# Patient Record
Sex: Female | Born: 1960
Health system: Southern US, Community
[De-identification: ages and names within clinical notes are randomized; demographics above are authoritative.]

## PROBLEM LIST (undated history)

## (undated) DIAGNOSIS — D2120 Benign neoplasm of connective and other soft tissue of unspecified lower limb, including hip: Secondary | ICD-10-CM

## (undated) DIAGNOSIS — M1990 Unspecified osteoarthritis, unspecified site: Secondary | ICD-10-CM

## (undated) DIAGNOSIS — D2371 Other benign neoplasm of skin of right lower limb, including hip: Secondary | ICD-10-CM

## (undated) DIAGNOSIS — M47817 Spondylosis without myelopathy or radiculopathy, lumbosacral region: Secondary | ICD-10-CM

## (undated) DIAGNOSIS — M797 Fibromyalgia: Secondary | ICD-10-CM

## (undated) DIAGNOSIS — G629 Polyneuropathy, unspecified: Secondary | ICD-10-CM

## (undated) DIAGNOSIS — G894 Chronic pain syndrome: Secondary | ICD-10-CM

## (undated) DIAGNOSIS — I10 Essential (primary) hypertension: Secondary | ICD-10-CM

## (undated) DIAGNOSIS — K219 Gastro-esophageal reflux disease without esophagitis: Secondary | ICD-10-CM

## (undated) DIAGNOSIS — R519 Headache, unspecified: Secondary | ICD-10-CM

## (undated) HISTORY — PX: REPLACEMENT TOTAL KNEE: SUR1224

## (undated) HISTORY — PX: COLONOSCOPY: SHX174

---

## 2010-12-01 DIAGNOSIS — R208 Other disturbances of skin sensation: Secondary | ICD-10-CM | POA: Insufficient documentation

## 2010-12-01 DIAGNOSIS — M797 Fibromyalgia: Secondary | ICD-10-CM | POA: Insufficient documentation

## 2011-02-17 DIAGNOSIS — E785 Hyperlipidemia, unspecified: Secondary | ICD-10-CM | POA: Insufficient documentation

## 2011-10-30 DIAGNOSIS — F32A Depression, unspecified: Secondary | ICD-10-CM | POA: Insufficient documentation

## 2011-10-30 DIAGNOSIS — F329 Major depressive disorder, single episode, unspecified: Secondary | ICD-10-CM | POA: Insufficient documentation

## 2012-02-18 DIAGNOSIS — M7918 Myalgia, other site: Secondary | ICD-10-CM | POA: Insufficient documentation

## 2012-02-18 DIAGNOSIS — M545 Low back pain, unspecified: Secondary | ICD-10-CM | POA: Insufficient documentation

## 2012-02-24 DIAGNOSIS — R2681 Unsteadiness on feet: Secondary | ICD-10-CM | POA: Insufficient documentation

## 2012-06-19 DIAGNOSIS — K219 Gastro-esophageal reflux disease without esophagitis: Secondary | ICD-10-CM | POA: Insufficient documentation

## 2012-07-30 DIAGNOSIS — M1710 Unilateral primary osteoarthritis, unspecified knee: Secondary | ICD-10-CM | POA: Insufficient documentation

## 2013-02-13 DIAGNOSIS — I10 Essential (primary) hypertension: Secondary | ICD-10-CM | POA: Insufficient documentation

## 2013-12-09 DIAGNOSIS — M2550 Pain in unspecified joint: Secondary | ICD-10-CM | POA: Insufficient documentation

## 2014-05-17 DIAGNOSIS — R200 Anesthesia of skin: Secondary | ICD-10-CM | POA: Insufficient documentation

## 2014-05-21 HISTORY — PX: BACK SURGERY: SHX140

## 2014-08-16 DIAGNOSIS — Z96652 Presence of left artificial knee joint: Secondary | ICD-10-CM | POA: Insufficient documentation

## 2016-05-21 HISTORY — PX: BREAST BIOPSY: SHX20

## 2016-09-05 DIAGNOSIS — M47817 Spondylosis without myelopathy or radiculopathy, lumbosacral region: Secondary | ICD-10-CM | POA: Insufficient documentation

## 2017-05-10 DIAGNOSIS — G950 Syringomyelia and syringobulbia: Secondary | ICD-10-CM | POA: Insufficient documentation

## 2017-08-05 DIAGNOSIS — G629 Polyneuropathy, unspecified: Secondary | ICD-10-CM | POA: Diagnosis not present

## 2017-08-05 DIAGNOSIS — G950 Syringomyelia and syringobulbia: Secondary | ICD-10-CM | POA: Diagnosis not present

## 2017-08-09 DIAGNOSIS — Z79899 Other long term (current) drug therapy: Secondary | ICD-10-CM | POA: Diagnosis not present

## 2017-08-09 DIAGNOSIS — G8929 Other chronic pain: Secondary | ICD-10-CM | POA: Diagnosis not present

## 2017-08-09 DIAGNOSIS — G950 Syringomyelia and syringobulbia: Secondary | ICD-10-CM | POA: Diagnosis not present

## 2017-08-09 DIAGNOSIS — Z5181 Encounter for therapeutic drug level monitoring: Secondary | ICD-10-CM | POA: Diagnosis not present

## 2017-08-09 DIAGNOSIS — M546 Pain in thoracic spine: Secondary | ICD-10-CM | POA: Diagnosis not present

## 2017-08-09 DIAGNOSIS — M545 Low back pain: Secondary | ICD-10-CM | POA: Diagnosis not present

## 2017-08-09 DIAGNOSIS — R339 Retention of urine, unspecified: Secondary | ICD-10-CM | POA: Diagnosis not present

## 2017-08-10 DIAGNOSIS — G950 Syringomyelia and syringobulbia: Secondary | ICD-10-CM | POA: Diagnosis not present

## 2017-08-13 DIAGNOSIS — G950 Syringomyelia and syringobulbia: Secondary | ICD-10-CM | POA: Diagnosis not present

## 2017-08-13 DIAGNOSIS — M4714 Other spondylosis with myelopathy, thoracic region: Secondary | ICD-10-CM | POA: Diagnosis not present

## 2017-08-22 DIAGNOSIS — M4714 Other spondylosis with myelopathy, thoracic region: Secondary | ICD-10-CM | POA: Diagnosis not present

## 2017-08-22 DIAGNOSIS — M542 Cervicalgia: Secondary | ICD-10-CM | POA: Diagnosis not present

## 2017-08-22 DIAGNOSIS — M4727 Other spondylosis with radiculopathy, lumbosacral region: Secondary | ICD-10-CM | POA: Diagnosis not present

## 2017-08-22 DIAGNOSIS — M4726 Other spondylosis with radiculopathy, lumbar region: Secondary | ICD-10-CM | POA: Diagnosis not present

## 2017-08-22 DIAGNOSIS — M545 Low back pain: Secondary | ICD-10-CM | POA: Diagnosis not present

## 2017-08-22 DIAGNOSIS — G950 Syringomyelia and syringobulbia: Secondary | ICD-10-CM | POA: Diagnosis not present

## 2017-08-23 DIAGNOSIS — M792 Neuralgia and neuritis, unspecified: Secondary | ICD-10-CM | POA: Diagnosis not present

## 2017-08-27 DIAGNOSIS — M4714 Other spondylosis with myelopathy, thoracic region: Secondary | ICD-10-CM | POA: Diagnosis not present

## 2017-08-27 DIAGNOSIS — G950 Syringomyelia and syringobulbia: Secondary | ICD-10-CM | POA: Diagnosis not present

## 2017-09-26 DIAGNOSIS — R7303 Prediabetes: Secondary | ICD-10-CM | POA: Diagnosis not present

## 2017-09-26 DIAGNOSIS — E785 Hyperlipidemia, unspecified: Secondary | ICD-10-CM | POA: Diagnosis not present

## 2017-09-26 DIAGNOSIS — N951 Menopausal and female climacteric states: Secondary | ICD-10-CM | POA: Diagnosis not present

## 2017-09-26 DIAGNOSIS — K635 Polyp of colon: Secondary | ICD-10-CM | POA: Diagnosis not present

## 2017-09-26 DIAGNOSIS — Z1211 Encounter for screening for malignant neoplasm of colon: Secondary | ICD-10-CM | POA: Diagnosis not present

## 2017-10-07 DIAGNOSIS — M47817 Spondylosis without myelopathy or radiculopathy, lumbosacral region: Secondary | ICD-10-CM | POA: Diagnosis not present

## 2017-10-07 DIAGNOSIS — G8929 Other chronic pain: Secondary | ICD-10-CM | POA: Diagnosis not present

## 2017-11-17 DIAGNOSIS — M1990 Unspecified osteoarthritis, unspecified site: Secondary | ICD-10-CM | POA: Diagnosis not present

## 2017-11-18 DIAGNOSIS — G629 Polyneuropathy, unspecified: Secondary | ICD-10-CM | POA: Diagnosis not present

## 2017-11-18 DIAGNOSIS — M797 Fibromyalgia: Secondary | ICD-10-CM | POA: Diagnosis not present

## 2017-11-18 DIAGNOSIS — M47817 Spondylosis without myelopathy or radiculopathy, lumbosacral region: Secondary | ICD-10-CM | POA: Diagnosis not present

## 2017-11-18 DIAGNOSIS — G950 Syringomyelia and syringobulbia: Secondary | ICD-10-CM | POA: Diagnosis not present

## 2017-11-18 DIAGNOSIS — Z79899 Other long term (current) drug therapy: Secondary | ICD-10-CM | POA: Diagnosis not present

## 2017-11-18 DIAGNOSIS — G894 Chronic pain syndrome: Secondary | ICD-10-CM | POA: Diagnosis not present

## 2017-12-11 DIAGNOSIS — Z951 Presence of aortocoronary bypass graft: Secondary | ICD-10-CM | POA: Diagnosis not present

## 2017-12-11 DIAGNOSIS — M4714 Other spondylosis with myelopathy, thoracic region: Secondary | ICD-10-CM | POA: Diagnosis not present

## 2017-12-11 DIAGNOSIS — Z01818 Encounter for other preprocedural examination: Secondary | ICD-10-CM | POA: Diagnosis not present

## 2017-12-11 DIAGNOSIS — Z87891 Personal history of nicotine dependence: Secondary | ICD-10-CM | POA: Diagnosis not present

## 2017-12-11 DIAGNOSIS — I493 Ventricular premature depolarization: Secondary | ICD-10-CM | POA: Diagnosis not present

## 2017-12-11 DIAGNOSIS — R001 Bradycardia, unspecified: Secondary | ICD-10-CM | POA: Diagnosis not present

## 2017-12-23 DIAGNOSIS — M4714 Other spondylosis with myelopathy, thoracic region: Secondary | ICD-10-CM | POA: Diagnosis not present

## 2017-12-23 DIAGNOSIS — Z87891 Personal history of nicotine dependence: Secondary | ICD-10-CM | POA: Diagnosis not present

## 2017-12-23 DIAGNOSIS — I10 Essential (primary) hypertension: Secondary | ICD-10-CM | POA: Diagnosis not present

## 2017-12-23 DIAGNOSIS — Z4789 Encounter for other orthopedic aftercare: Secondary | ICD-10-CM | POA: Diagnosis not present

## 2017-12-23 DIAGNOSIS — Z79899 Other long term (current) drug therapy: Secondary | ICD-10-CM | POA: Diagnosis not present

## 2017-12-23 DIAGNOSIS — J309 Allergic rhinitis, unspecified: Secondary | ICD-10-CM | POA: Diagnosis not present

## 2017-12-23 DIAGNOSIS — K219 Gastro-esophageal reflux disease without esophagitis: Secondary | ICD-10-CM | POA: Diagnosis not present

## 2017-12-23 DIAGNOSIS — M545 Low back pain: Secondary | ICD-10-CM | POA: Diagnosis not present

## 2017-12-23 DIAGNOSIS — G9619 Other disorders of meninges, not elsewhere classified: Secondary | ICD-10-CM | POA: Diagnosis not present

## 2017-12-23 DIAGNOSIS — K5900 Constipation, unspecified: Secondary | ICD-10-CM | POA: Diagnosis not present

## 2017-12-23 DIAGNOSIS — G9529 Other cord compression: Secondary | ICD-10-CM | POA: Diagnosis not present

## 2017-12-23 DIAGNOSIS — Z96652 Presence of left artificial knee joint: Secondary | ICD-10-CM | POA: Diagnosis not present

## 2017-12-23 DIAGNOSIS — G959 Disease of spinal cord, unspecified: Secondary | ICD-10-CM | POA: Diagnosis not present

## 2017-12-23 DIAGNOSIS — E785 Hyperlipidemia, unspecified: Secondary | ICD-10-CM | POA: Diagnosis not present

## 2017-12-23 DIAGNOSIS — E569 Vitamin deficiency, unspecified: Secondary | ICD-10-CM | POA: Diagnosis not present

## 2017-12-23 DIAGNOSIS — R3915 Urgency of urination: Secondary | ICD-10-CM | POA: Diagnosis not present

## 2017-12-23 DIAGNOSIS — G930 Cerebral cysts: Secondary | ICD-10-CM | POA: Diagnosis not present

## 2017-12-23 DIAGNOSIS — G950 Syringomyelia and syringobulbia: Secondary | ICD-10-CM | POA: Diagnosis not present

## 2017-12-26 DIAGNOSIS — M4714 Other spondylosis with myelopathy, thoracic region: Secondary | ICD-10-CM | POA: Insufficient documentation

## 2017-12-27 DIAGNOSIS — J309 Allergic rhinitis, unspecified: Secondary | ICD-10-CM | POA: Diagnosis not present

## 2017-12-27 DIAGNOSIS — M5489 Other dorsalgia: Secondary | ICD-10-CM | POA: Diagnosis not present

## 2017-12-27 DIAGNOSIS — I10 Essential (primary) hypertension: Secondary | ICD-10-CM | POA: Diagnosis not present

## 2017-12-27 DIAGNOSIS — E569 Vitamin deficiency, unspecified: Secondary | ICD-10-CM | POA: Diagnosis not present

## 2017-12-27 DIAGNOSIS — Z4789 Encounter for other orthopedic aftercare: Secondary | ICD-10-CM | POA: Diagnosis not present

## 2017-12-27 DIAGNOSIS — M4714 Other spondylosis with myelopathy, thoracic region: Secondary | ICD-10-CM | POA: Diagnosis not present

## 2017-12-27 DIAGNOSIS — R531 Weakness: Secondary | ICD-10-CM | POA: Diagnosis not present

## 2017-12-27 DIAGNOSIS — E785 Hyperlipidemia, unspecified: Secondary | ICD-10-CM | POA: Diagnosis not present

## 2017-12-27 DIAGNOSIS — J302 Other seasonal allergic rhinitis: Secondary | ICD-10-CM | POA: Diagnosis not present

## 2017-12-27 DIAGNOSIS — M545 Low back pain: Secondary | ICD-10-CM | POA: Diagnosis not present

## 2017-12-27 DIAGNOSIS — K5900 Constipation, unspecified: Secondary | ICD-10-CM | POA: Diagnosis not present

## 2017-12-27 DIAGNOSIS — N390 Urinary tract infection, site not specified: Secondary | ICD-10-CM | POA: Diagnosis not present

## 2017-12-27 DIAGNOSIS — M2550 Pain in unspecified joint: Secondary | ICD-10-CM | POA: Diagnosis not present

## 2017-12-29 DIAGNOSIS — M5489 Other dorsalgia: Secondary | ICD-10-CM | POA: Diagnosis not present

## 2017-12-29 DIAGNOSIS — I10 Essential (primary) hypertension: Secondary | ICD-10-CM | POA: Diagnosis not present

## 2018-01-15 DIAGNOSIS — M5489 Other dorsalgia: Secondary | ICD-10-CM | POA: Diagnosis not present

## 2018-01-15 DIAGNOSIS — I10 Essential (primary) hypertension: Secondary | ICD-10-CM | POA: Diagnosis not present

## 2018-01-15 DIAGNOSIS — J302 Other seasonal allergic rhinitis: Secondary | ICD-10-CM | POA: Diagnosis not present

## 2018-01-18 DIAGNOSIS — M4714 Other spondylosis with myelopathy, thoracic region: Secondary | ICD-10-CM | POA: Diagnosis not present

## 2018-01-18 DIAGNOSIS — E785 Hyperlipidemia, unspecified: Secondary | ICD-10-CM | POA: Diagnosis not present

## 2018-01-18 DIAGNOSIS — Z9181 History of falling: Secondary | ICD-10-CM | POA: Diagnosis not present

## 2018-01-18 DIAGNOSIS — M797 Fibromyalgia: Secondary | ICD-10-CM | POA: Diagnosis not present

## 2018-01-18 DIAGNOSIS — I10 Essential (primary) hypertension: Secondary | ICD-10-CM | POA: Diagnosis not present

## 2018-01-18 DIAGNOSIS — M1991 Primary osteoarthritis, unspecified site: Secondary | ICD-10-CM | POA: Diagnosis not present

## 2018-01-18 DIAGNOSIS — K5900 Constipation, unspecified: Secondary | ICD-10-CM | POA: Diagnosis not present

## 2018-01-18 DIAGNOSIS — K219 Gastro-esophageal reflux disease without esophagitis: Secondary | ICD-10-CM | POA: Diagnosis not present

## 2018-01-18 DIAGNOSIS — Z4789 Encounter for other orthopedic aftercare: Secondary | ICD-10-CM | POA: Diagnosis not present

## 2018-01-18 DIAGNOSIS — G4700 Insomnia, unspecified: Secondary | ICD-10-CM | POA: Diagnosis not present

## 2018-01-18 DIAGNOSIS — Z87891 Personal history of nicotine dependence: Secondary | ICD-10-CM | POA: Diagnosis not present

## 2018-01-18 DIAGNOSIS — Z96652 Presence of left artificial knee joint: Secondary | ICD-10-CM | POA: Diagnosis not present

## 2018-01-21 DIAGNOSIS — Z9181 History of falling: Secondary | ICD-10-CM | POA: Diagnosis not present

## 2018-01-21 DIAGNOSIS — K5900 Constipation, unspecified: Secondary | ICD-10-CM | POA: Diagnosis not present

## 2018-01-21 DIAGNOSIS — E785 Hyperlipidemia, unspecified: Secondary | ICD-10-CM | POA: Diagnosis not present

## 2018-01-21 DIAGNOSIS — Z96652 Presence of left artificial knee joint: Secondary | ICD-10-CM | POA: Diagnosis not present

## 2018-01-21 DIAGNOSIS — M797 Fibromyalgia: Secondary | ICD-10-CM | POA: Diagnosis not present

## 2018-01-21 DIAGNOSIS — I10 Essential (primary) hypertension: Secondary | ICD-10-CM | POA: Diagnosis not present

## 2018-01-21 DIAGNOSIS — Z4789 Encounter for other orthopedic aftercare: Secondary | ICD-10-CM | POA: Diagnosis not present

## 2018-01-21 DIAGNOSIS — Z87891 Personal history of nicotine dependence: Secondary | ICD-10-CM | POA: Diagnosis not present

## 2018-01-21 DIAGNOSIS — G4700 Insomnia, unspecified: Secondary | ICD-10-CM | POA: Diagnosis not present

## 2018-01-21 DIAGNOSIS — M4714 Other spondylosis with myelopathy, thoracic region: Secondary | ICD-10-CM | POA: Diagnosis not present

## 2018-01-21 DIAGNOSIS — M1991 Primary osteoarthritis, unspecified site: Secondary | ICD-10-CM | POA: Diagnosis not present

## 2018-01-21 DIAGNOSIS — K219 Gastro-esophageal reflux disease without esophagitis: Secondary | ICD-10-CM | POA: Diagnosis not present

## 2018-01-22 DIAGNOSIS — I10 Essential (primary) hypertension: Secondary | ICD-10-CM | POA: Diagnosis not present

## 2018-01-22 DIAGNOSIS — Z87891 Personal history of nicotine dependence: Secondary | ICD-10-CM | POA: Diagnosis not present

## 2018-01-22 DIAGNOSIS — G4700 Insomnia, unspecified: Secondary | ICD-10-CM | POA: Diagnosis not present

## 2018-01-22 DIAGNOSIS — E785 Hyperlipidemia, unspecified: Secondary | ICD-10-CM | POA: Diagnosis not present

## 2018-01-22 DIAGNOSIS — M1991 Primary osteoarthritis, unspecified site: Secondary | ICD-10-CM | POA: Diagnosis not present

## 2018-01-22 DIAGNOSIS — K219 Gastro-esophageal reflux disease without esophagitis: Secondary | ICD-10-CM | POA: Diagnosis not present

## 2018-01-22 DIAGNOSIS — Z96652 Presence of left artificial knee joint: Secondary | ICD-10-CM | POA: Diagnosis not present

## 2018-01-22 DIAGNOSIS — M797 Fibromyalgia: Secondary | ICD-10-CM | POA: Diagnosis not present

## 2018-01-22 DIAGNOSIS — Z9181 History of falling: Secondary | ICD-10-CM | POA: Diagnosis not present

## 2018-01-22 DIAGNOSIS — K5900 Constipation, unspecified: Secondary | ICD-10-CM | POA: Diagnosis not present

## 2018-01-22 DIAGNOSIS — M4714 Other spondylosis with myelopathy, thoracic region: Secondary | ICD-10-CM | POA: Diagnosis not present

## 2018-01-22 DIAGNOSIS — Z4789 Encounter for other orthopedic aftercare: Secondary | ICD-10-CM | POA: Diagnosis not present

## 2018-01-23 DIAGNOSIS — R232 Flushing: Secondary | ICD-10-CM | POA: Insufficient documentation

## 2018-01-23 DIAGNOSIS — E785 Hyperlipidemia, unspecified: Secondary | ICD-10-CM | POA: Diagnosis not present

## 2018-01-23 DIAGNOSIS — G950 Syringomyelia and syringobulbia: Secondary | ICD-10-CM | POA: Diagnosis not present

## 2018-01-23 DIAGNOSIS — R7303 Prediabetes: Secondary | ICD-10-CM | POA: Insufficient documentation

## 2018-01-23 DIAGNOSIS — G4700 Insomnia, unspecified: Secondary | ICD-10-CM | POA: Diagnosis not present

## 2018-01-23 DIAGNOSIS — I10 Essential (primary) hypertension: Secondary | ICD-10-CM | POA: Diagnosis not present

## 2018-01-23 DIAGNOSIS — M4714 Other spondylosis with myelopathy, thoracic region: Secondary | ICD-10-CM | POA: Diagnosis not present

## 2018-01-24 DIAGNOSIS — K219 Gastro-esophageal reflux disease without esophagitis: Secondary | ICD-10-CM | POA: Diagnosis not present

## 2018-01-24 DIAGNOSIS — Z96652 Presence of left artificial knee joint: Secondary | ICD-10-CM | POA: Diagnosis not present

## 2018-01-24 DIAGNOSIS — M797 Fibromyalgia: Secondary | ICD-10-CM | POA: Diagnosis not present

## 2018-01-24 DIAGNOSIS — M4714 Other spondylosis with myelopathy, thoracic region: Secondary | ICD-10-CM | POA: Diagnosis not present

## 2018-01-24 DIAGNOSIS — Z4789 Encounter for other orthopedic aftercare: Secondary | ICD-10-CM | POA: Diagnosis not present

## 2018-01-24 DIAGNOSIS — Z87891 Personal history of nicotine dependence: Secondary | ICD-10-CM | POA: Diagnosis not present

## 2018-01-24 DIAGNOSIS — G4700 Insomnia, unspecified: Secondary | ICD-10-CM | POA: Diagnosis not present

## 2018-01-24 DIAGNOSIS — M1991 Primary osteoarthritis, unspecified site: Secondary | ICD-10-CM | POA: Diagnosis not present

## 2018-01-24 DIAGNOSIS — K5900 Constipation, unspecified: Secondary | ICD-10-CM | POA: Diagnosis not present

## 2018-01-24 DIAGNOSIS — I10 Essential (primary) hypertension: Secondary | ICD-10-CM | POA: Diagnosis not present

## 2018-01-24 DIAGNOSIS — E785 Hyperlipidemia, unspecified: Secondary | ICD-10-CM | POA: Diagnosis not present

## 2018-01-24 DIAGNOSIS — Z9181 History of falling: Secondary | ICD-10-CM | POA: Diagnosis not present

## 2018-01-27 DIAGNOSIS — I10 Essential (primary) hypertension: Secondary | ICD-10-CM | POA: Diagnosis not present

## 2018-01-27 DIAGNOSIS — Z96652 Presence of left artificial knee joint: Secondary | ICD-10-CM | POA: Diagnosis not present

## 2018-01-27 DIAGNOSIS — Z4789 Encounter for other orthopedic aftercare: Secondary | ICD-10-CM | POA: Diagnosis not present

## 2018-01-27 DIAGNOSIS — M797 Fibromyalgia: Secondary | ICD-10-CM | POA: Diagnosis not present

## 2018-01-27 DIAGNOSIS — G4700 Insomnia, unspecified: Secondary | ICD-10-CM | POA: Diagnosis not present

## 2018-01-27 DIAGNOSIS — K219 Gastro-esophageal reflux disease without esophagitis: Secondary | ICD-10-CM | POA: Diagnosis not present

## 2018-01-27 DIAGNOSIS — E785 Hyperlipidemia, unspecified: Secondary | ICD-10-CM | POA: Diagnosis not present

## 2018-01-27 DIAGNOSIS — Z87891 Personal history of nicotine dependence: Secondary | ICD-10-CM | POA: Diagnosis not present

## 2018-01-27 DIAGNOSIS — M1991 Primary osteoarthritis, unspecified site: Secondary | ICD-10-CM | POA: Diagnosis not present

## 2018-01-27 DIAGNOSIS — K5900 Constipation, unspecified: Secondary | ICD-10-CM | POA: Diagnosis not present

## 2018-01-27 DIAGNOSIS — M4714 Other spondylosis with myelopathy, thoracic region: Secondary | ICD-10-CM | POA: Diagnosis not present

## 2018-01-27 DIAGNOSIS — Z9181 History of falling: Secondary | ICD-10-CM | POA: Diagnosis not present

## 2018-01-29 DIAGNOSIS — Z96652 Presence of left artificial knee joint: Secondary | ICD-10-CM | POA: Diagnosis not present

## 2018-01-29 DIAGNOSIS — Z4789 Encounter for other orthopedic aftercare: Secondary | ICD-10-CM | POA: Diagnosis not present

## 2018-01-29 DIAGNOSIS — Z9181 History of falling: Secondary | ICD-10-CM | POA: Diagnosis not present

## 2018-01-29 DIAGNOSIS — K5900 Constipation, unspecified: Secondary | ICD-10-CM | POA: Diagnosis not present

## 2018-01-29 DIAGNOSIS — G4700 Insomnia, unspecified: Secondary | ICD-10-CM | POA: Diagnosis not present

## 2018-01-29 DIAGNOSIS — M4714 Other spondylosis with myelopathy, thoracic region: Secondary | ICD-10-CM | POA: Diagnosis not present

## 2018-01-29 DIAGNOSIS — K219 Gastro-esophageal reflux disease without esophagitis: Secondary | ICD-10-CM | POA: Diagnosis not present

## 2018-01-29 DIAGNOSIS — Z87891 Personal history of nicotine dependence: Secondary | ICD-10-CM | POA: Diagnosis not present

## 2018-01-29 DIAGNOSIS — E785 Hyperlipidemia, unspecified: Secondary | ICD-10-CM | POA: Diagnosis not present

## 2018-01-29 DIAGNOSIS — M797 Fibromyalgia: Secondary | ICD-10-CM | POA: Diagnosis not present

## 2018-01-29 DIAGNOSIS — I10 Essential (primary) hypertension: Secondary | ICD-10-CM | POA: Diagnosis not present

## 2018-01-29 DIAGNOSIS — M1991 Primary osteoarthritis, unspecified site: Secondary | ICD-10-CM | POA: Diagnosis not present

## 2018-02-03 DIAGNOSIS — K219 Gastro-esophageal reflux disease without esophagitis: Secondary | ICD-10-CM | POA: Diagnosis not present

## 2018-02-03 DIAGNOSIS — G4700 Insomnia, unspecified: Secondary | ICD-10-CM | POA: Diagnosis not present

## 2018-02-03 DIAGNOSIS — M1991 Primary osteoarthritis, unspecified site: Secondary | ICD-10-CM | POA: Diagnosis not present

## 2018-02-03 DIAGNOSIS — M797 Fibromyalgia: Secondary | ICD-10-CM | POA: Diagnosis not present

## 2018-02-03 DIAGNOSIS — Z96652 Presence of left artificial knee joint: Secondary | ICD-10-CM | POA: Diagnosis not present

## 2018-02-03 DIAGNOSIS — I10 Essential (primary) hypertension: Secondary | ICD-10-CM | POA: Diagnosis not present

## 2018-02-03 DIAGNOSIS — Z87891 Personal history of nicotine dependence: Secondary | ICD-10-CM | POA: Diagnosis not present

## 2018-02-03 DIAGNOSIS — Z9181 History of falling: Secondary | ICD-10-CM | POA: Diagnosis not present

## 2018-02-03 DIAGNOSIS — M4714 Other spondylosis with myelopathy, thoracic region: Secondary | ICD-10-CM | POA: Diagnosis not present

## 2018-02-03 DIAGNOSIS — Z4789 Encounter for other orthopedic aftercare: Secondary | ICD-10-CM | POA: Diagnosis not present

## 2018-02-03 DIAGNOSIS — E785 Hyperlipidemia, unspecified: Secondary | ICD-10-CM | POA: Diagnosis not present

## 2018-02-03 DIAGNOSIS — K5900 Constipation, unspecified: Secondary | ICD-10-CM | POA: Diagnosis not present

## 2018-02-04 DIAGNOSIS — K219 Gastro-esophageal reflux disease without esophagitis: Secondary | ICD-10-CM | POA: Diagnosis not present

## 2018-02-04 DIAGNOSIS — Z87891 Personal history of nicotine dependence: Secondary | ICD-10-CM | POA: Diagnosis not present

## 2018-02-04 DIAGNOSIS — Z4789 Encounter for other orthopedic aftercare: Secondary | ICD-10-CM | POA: Diagnosis not present

## 2018-02-04 DIAGNOSIS — K5900 Constipation, unspecified: Secondary | ICD-10-CM | POA: Diagnosis not present

## 2018-02-04 DIAGNOSIS — M797 Fibromyalgia: Secondary | ICD-10-CM | POA: Diagnosis not present

## 2018-02-04 DIAGNOSIS — G4700 Insomnia, unspecified: Secondary | ICD-10-CM | POA: Diagnosis not present

## 2018-02-04 DIAGNOSIS — E785 Hyperlipidemia, unspecified: Secondary | ICD-10-CM | POA: Diagnosis not present

## 2018-02-04 DIAGNOSIS — M4714 Other spondylosis with myelopathy, thoracic region: Secondary | ICD-10-CM | POA: Diagnosis not present

## 2018-02-04 DIAGNOSIS — M1991 Primary osteoarthritis, unspecified site: Secondary | ICD-10-CM | POA: Diagnosis not present

## 2018-02-04 DIAGNOSIS — Z9181 History of falling: Secondary | ICD-10-CM | POA: Diagnosis not present

## 2018-02-04 DIAGNOSIS — I10 Essential (primary) hypertension: Secondary | ICD-10-CM | POA: Diagnosis not present

## 2018-02-04 DIAGNOSIS — Z96652 Presence of left artificial knee joint: Secondary | ICD-10-CM | POA: Diagnosis not present

## 2018-02-05 DIAGNOSIS — K219 Gastro-esophageal reflux disease without esophagitis: Secondary | ICD-10-CM | POA: Diagnosis not present

## 2018-02-05 DIAGNOSIS — Z4789 Encounter for other orthopedic aftercare: Secondary | ICD-10-CM | POA: Diagnosis not present

## 2018-02-05 DIAGNOSIS — I10 Essential (primary) hypertension: Secondary | ICD-10-CM | POA: Diagnosis not present

## 2018-02-05 DIAGNOSIS — Z96652 Presence of left artificial knee joint: Secondary | ICD-10-CM | POA: Diagnosis not present

## 2018-02-05 DIAGNOSIS — E785 Hyperlipidemia, unspecified: Secondary | ICD-10-CM | POA: Diagnosis not present

## 2018-02-05 DIAGNOSIS — M797 Fibromyalgia: Secondary | ICD-10-CM | POA: Diagnosis not present

## 2018-02-05 DIAGNOSIS — Z9181 History of falling: Secondary | ICD-10-CM | POA: Diagnosis not present

## 2018-02-05 DIAGNOSIS — Z87891 Personal history of nicotine dependence: Secondary | ICD-10-CM | POA: Diagnosis not present

## 2018-02-05 DIAGNOSIS — M1991 Primary osteoarthritis, unspecified site: Secondary | ICD-10-CM | POA: Diagnosis not present

## 2018-02-05 DIAGNOSIS — M4714 Other spondylosis with myelopathy, thoracic region: Secondary | ICD-10-CM | POA: Diagnosis not present

## 2018-02-05 DIAGNOSIS — G4700 Insomnia, unspecified: Secondary | ICD-10-CM | POA: Diagnosis not present

## 2018-02-05 DIAGNOSIS — K5900 Constipation, unspecified: Secondary | ICD-10-CM | POA: Diagnosis not present

## 2018-02-07 DIAGNOSIS — M4714 Other spondylosis with myelopathy, thoracic region: Secondary | ICD-10-CM | POA: Diagnosis not present

## 2018-02-07 DIAGNOSIS — K219 Gastro-esophageal reflux disease without esophagitis: Secondary | ICD-10-CM | POA: Diagnosis not present

## 2018-02-07 DIAGNOSIS — M1991 Primary osteoarthritis, unspecified site: Secondary | ICD-10-CM | POA: Diagnosis not present

## 2018-02-07 DIAGNOSIS — M797 Fibromyalgia: Secondary | ICD-10-CM | POA: Diagnosis not present

## 2018-02-07 DIAGNOSIS — I10 Essential (primary) hypertension: Secondary | ICD-10-CM | POA: Diagnosis not present

## 2018-02-07 DIAGNOSIS — E785 Hyperlipidemia, unspecified: Secondary | ICD-10-CM | POA: Diagnosis not present

## 2018-02-07 DIAGNOSIS — Z4789 Encounter for other orthopedic aftercare: Secondary | ICD-10-CM | POA: Diagnosis not present

## 2018-02-07 DIAGNOSIS — G4700 Insomnia, unspecified: Secondary | ICD-10-CM | POA: Diagnosis not present

## 2018-02-07 DIAGNOSIS — Z9181 History of falling: Secondary | ICD-10-CM | POA: Diagnosis not present

## 2018-02-07 DIAGNOSIS — Z87891 Personal history of nicotine dependence: Secondary | ICD-10-CM | POA: Diagnosis not present

## 2018-02-07 DIAGNOSIS — Z96652 Presence of left artificial knee joint: Secondary | ICD-10-CM | POA: Diagnosis not present

## 2018-02-07 DIAGNOSIS — K5900 Constipation, unspecified: Secondary | ICD-10-CM | POA: Diagnosis not present

## 2018-02-10 DIAGNOSIS — M797 Fibromyalgia: Secondary | ICD-10-CM | POA: Diagnosis not present

## 2018-02-10 DIAGNOSIS — M1991 Primary osteoarthritis, unspecified site: Secondary | ICD-10-CM | POA: Diagnosis not present

## 2018-02-10 DIAGNOSIS — M4714 Other spondylosis with myelopathy, thoracic region: Secondary | ICD-10-CM | POA: Diagnosis not present

## 2018-02-10 DIAGNOSIS — Z4789 Encounter for other orthopedic aftercare: Secondary | ICD-10-CM | POA: Diagnosis not present

## 2018-02-10 DIAGNOSIS — E785 Hyperlipidemia, unspecified: Secondary | ICD-10-CM | POA: Diagnosis not present

## 2018-02-10 DIAGNOSIS — Z96652 Presence of left artificial knee joint: Secondary | ICD-10-CM | POA: Diagnosis not present

## 2018-02-10 DIAGNOSIS — I10 Essential (primary) hypertension: Secondary | ICD-10-CM | POA: Diagnosis not present

## 2018-02-10 DIAGNOSIS — Z9181 History of falling: Secondary | ICD-10-CM | POA: Diagnosis not present

## 2018-02-10 DIAGNOSIS — Z87891 Personal history of nicotine dependence: Secondary | ICD-10-CM | POA: Diagnosis not present

## 2018-02-10 DIAGNOSIS — K5900 Constipation, unspecified: Secondary | ICD-10-CM | POA: Diagnosis not present

## 2018-02-10 DIAGNOSIS — K219 Gastro-esophageal reflux disease without esophagitis: Secondary | ICD-10-CM | POA: Diagnosis not present

## 2018-02-10 DIAGNOSIS — G4700 Insomnia, unspecified: Secondary | ICD-10-CM | POA: Diagnosis not present

## 2018-02-11 DIAGNOSIS — I10 Essential (primary) hypertension: Secondary | ICD-10-CM | POA: Diagnosis not present

## 2018-02-11 DIAGNOSIS — E785 Hyperlipidemia, unspecified: Secondary | ICD-10-CM | POA: Diagnosis not present

## 2018-02-11 DIAGNOSIS — Z96652 Presence of left artificial knee joint: Secondary | ICD-10-CM | POA: Diagnosis not present

## 2018-02-11 DIAGNOSIS — G4700 Insomnia, unspecified: Secondary | ICD-10-CM | POA: Diagnosis not present

## 2018-02-11 DIAGNOSIS — Z87891 Personal history of nicotine dependence: Secondary | ICD-10-CM | POA: Diagnosis not present

## 2018-02-11 DIAGNOSIS — K219 Gastro-esophageal reflux disease without esophagitis: Secondary | ICD-10-CM | POA: Diagnosis not present

## 2018-02-11 DIAGNOSIS — K5900 Constipation, unspecified: Secondary | ICD-10-CM | POA: Diagnosis not present

## 2018-02-11 DIAGNOSIS — Z9181 History of falling: Secondary | ICD-10-CM | POA: Diagnosis not present

## 2018-02-11 DIAGNOSIS — M1991 Primary osteoarthritis, unspecified site: Secondary | ICD-10-CM | POA: Diagnosis not present

## 2018-02-11 DIAGNOSIS — Z4789 Encounter for other orthopedic aftercare: Secondary | ICD-10-CM | POA: Diagnosis not present

## 2018-02-11 DIAGNOSIS — M4714 Other spondylosis with myelopathy, thoracic region: Secondary | ICD-10-CM | POA: Diagnosis not present

## 2018-02-11 DIAGNOSIS — M797 Fibromyalgia: Secondary | ICD-10-CM | POA: Diagnosis not present

## 2018-02-12 DIAGNOSIS — G4700 Insomnia, unspecified: Secondary | ICD-10-CM | POA: Diagnosis not present

## 2018-02-12 DIAGNOSIS — E785 Hyperlipidemia, unspecified: Secondary | ICD-10-CM | POA: Diagnosis not present

## 2018-02-12 DIAGNOSIS — Z87891 Personal history of nicotine dependence: Secondary | ICD-10-CM | POA: Diagnosis not present

## 2018-02-12 DIAGNOSIS — M1991 Primary osteoarthritis, unspecified site: Secondary | ICD-10-CM | POA: Diagnosis not present

## 2018-02-12 DIAGNOSIS — M797 Fibromyalgia: Secondary | ICD-10-CM | POA: Diagnosis not present

## 2018-02-12 DIAGNOSIS — Z96652 Presence of left artificial knee joint: Secondary | ICD-10-CM | POA: Diagnosis not present

## 2018-02-12 DIAGNOSIS — Z9181 History of falling: Secondary | ICD-10-CM | POA: Diagnosis not present

## 2018-02-12 DIAGNOSIS — M4714 Other spondylosis with myelopathy, thoracic region: Secondary | ICD-10-CM | POA: Diagnosis not present

## 2018-02-12 DIAGNOSIS — I10 Essential (primary) hypertension: Secondary | ICD-10-CM | POA: Diagnosis not present

## 2018-02-12 DIAGNOSIS — K5900 Constipation, unspecified: Secondary | ICD-10-CM | POA: Diagnosis not present

## 2018-02-12 DIAGNOSIS — K219 Gastro-esophageal reflux disease without esophagitis: Secondary | ICD-10-CM | POA: Diagnosis not present

## 2018-02-12 DIAGNOSIS — Z4789 Encounter for other orthopedic aftercare: Secondary | ICD-10-CM | POA: Diagnosis not present

## 2018-02-14 DIAGNOSIS — Z4789 Encounter for other orthopedic aftercare: Secondary | ICD-10-CM | POA: Diagnosis not present

## 2018-02-14 DIAGNOSIS — Z87891 Personal history of nicotine dependence: Secondary | ICD-10-CM | POA: Diagnosis not present

## 2018-02-14 DIAGNOSIS — Z9181 History of falling: Secondary | ICD-10-CM | POA: Diagnosis not present

## 2018-02-14 DIAGNOSIS — M4714 Other spondylosis with myelopathy, thoracic region: Secondary | ICD-10-CM | POA: Diagnosis not present

## 2018-02-14 DIAGNOSIS — Z96652 Presence of left artificial knee joint: Secondary | ICD-10-CM | POA: Diagnosis not present

## 2018-02-14 DIAGNOSIS — K219 Gastro-esophageal reflux disease without esophagitis: Secondary | ICD-10-CM | POA: Diagnosis not present

## 2018-02-14 DIAGNOSIS — E785 Hyperlipidemia, unspecified: Secondary | ICD-10-CM | POA: Diagnosis not present

## 2018-02-14 DIAGNOSIS — I10 Essential (primary) hypertension: Secondary | ICD-10-CM | POA: Diagnosis not present

## 2018-02-14 DIAGNOSIS — M1991 Primary osteoarthritis, unspecified site: Secondary | ICD-10-CM | POA: Diagnosis not present

## 2018-02-14 DIAGNOSIS — M797 Fibromyalgia: Secondary | ICD-10-CM | POA: Diagnosis not present

## 2018-02-14 DIAGNOSIS — K5900 Constipation, unspecified: Secondary | ICD-10-CM | POA: Diagnosis not present

## 2018-02-14 DIAGNOSIS — G4700 Insomnia, unspecified: Secondary | ICD-10-CM | POA: Diagnosis not present

## 2018-02-17 DIAGNOSIS — K5900 Constipation, unspecified: Secondary | ICD-10-CM | POA: Diagnosis not present

## 2018-02-17 DIAGNOSIS — G4700 Insomnia, unspecified: Secondary | ICD-10-CM | POA: Diagnosis not present

## 2018-02-17 DIAGNOSIS — Z4789 Encounter for other orthopedic aftercare: Secondary | ICD-10-CM | POA: Diagnosis not present

## 2018-02-17 DIAGNOSIS — I10 Essential (primary) hypertension: Secondary | ICD-10-CM | POA: Diagnosis not present

## 2018-02-17 DIAGNOSIS — M4714 Other spondylosis with myelopathy, thoracic region: Secondary | ICD-10-CM | POA: Diagnosis not present

## 2018-02-17 DIAGNOSIS — Z96652 Presence of left artificial knee joint: Secondary | ICD-10-CM | POA: Diagnosis not present

## 2018-02-17 DIAGNOSIS — M1991 Primary osteoarthritis, unspecified site: Secondary | ICD-10-CM | POA: Diagnosis not present

## 2018-02-17 DIAGNOSIS — Z87891 Personal history of nicotine dependence: Secondary | ICD-10-CM | POA: Diagnosis not present

## 2018-02-17 DIAGNOSIS — M797 Fibromyalgia: Secondary | ICD-10-CM | POA: Diagnosis not present

## 2018-02-17 DIAGNOSIS — E785 Hyperlipidemia, unspecified: Secondary | ICD-10-CM | POA: Diagnosis not present

## 2018-02-17 DIAGNOSIS — K219 Gastro-esophageal reflux disease without esophagitis: Secondary | ICD-10-CM | POA: Diagnosis not present

## 2018-02-17 DIAGNOSIS — Z9181 History of falling: Secondary | ICD-10-CM | POA: Diagnosis not present

## 2018-02-19 DIAGNOSIS — E785 Hyperlipidemia, unspecified: Secondary | ICD-10-CM | POA: Diagnosis not present

## 2018-02-19 DIAGNOSIS — G4700 Insomnia, unspecified: Secondary | ICD-10-CM | POA: Diagnosis not present

## 2018-02-19 DIAGNOSIS — M4714 Other spondylosis with myelopathy, thoracic region: Secondary | ICD-10-CM | POA: Diagnosis not present

## 2018-02-19 DIAGNOSIS — I10 Essential (primary) hypertension: Secondary | ICD-10-CM | POA: Diagnosis not present

## 2018-02-19 DIAGNOSIS — Z4789 Encounter for other orthopedic aftercare: Secondary | ICD-10-CM | POA: Diagnosis not present

## 2018-02-19 DIAGNOSIS — Z96652 Presence of left artificial knee joint: Secondary | ICD-10-CM | POA: Diagnosis not present

## 2018-02-19 DIAGNOSIS — K219 Gastro-esophageal reflux disease without esophagitis: Secondary | ICD-10-CM | POA: Diagnosis not present

## 2018-02-19 DIAGNOSIS — K5900 Constipation, unspecified: Secondary | ICD-10-CM | POA: Diagnosis not present

## 2018-02-19 DIAGNOSIS — M797 Fibromyalgia: Secondary | ICD-10-CM | POA: Diagnosis not present

## 2018-02-19 DIAGNOSIS — Z87891 Personal history of nicotine dependence: Secondary | ICD-10-CM | POA: Diagnosis not present

## 2018-02-19 DIAGNOSIS — M1991 Primary osteoarthritis, unspecified site: Secondary | ICD-10-CM | POA: Diagnosis not present

## 2018-02-19 DIAGNOSIS — Z9181 History of falling: Secondary | ICD-10-CM | POA: Diagnosis not present

## 2018-02-25 DIAGNOSIS — Z9181 History of falling: Secondary | ICD-10-CM | POA: Diagnosis not present

## 2018-02-25 DIAGNOSIS — M797 Fibromyalgia: Secondary | ICD-10-CM | POA: Diagnosis not present

## 2018-02-25 DIAGNOSIS — Z4789 Encounter for other orthopedic aftercare: Secondary | ICD-10-CM | POA: Diagnosis not present

## 2018-02-25 DIAGNOSIS — K219 Gastro-esophageal reflux disease without esophagitis: Secondary | ICD-10-CM | POA: Diagnosis not present

## 2018-02-25 DIAGNOSIS — E785 Hyperlipidemia, unspecified: Secondary | ICD-10-CM | POA: Diagnosis not present

## 2018-02-25 DIAGNOSIS — K5900 Constipation, unspecified: Secondary | ICD-10-CM | POA: Diagnosis not present

## 2018-02-25 DIAGNOSIS — I10 Essential (primary) hypertension: Secondary | ICD-10-CM | POA: Diagnosis not present

## 2018-02-25 DIAGNOSIS — G4700 Insomnia, unspecified: Secondary | ICD-10-CM | POA: Diagnosis not present

## 2018-02-25 DIAGNOSIS — M4714 Other spondylosis with myelopathy, thoracic region: Secondary | ICD-10-CM | POA: Diagnosis not present

## 2018-02-25 DIAGNOSIS — M1991 Primary osteoarthritis, unspecified site: Secondary | ICD-10-CM | POA: Diagnosis not present

## 2018-02-25 DIAGNOSIS — Z96652 Presence of left artificial knee joint: Secondary | ICD-10-CM | POA: Diagnosis not present

## 2018-02-25 DIAGNOSIS — Z87891 Personal history of nicotine dependence: Secondary | ICD-10-CM | POA: Diagnosis not present

## 2018-02-26 DIAGNOSIS — G4700 Insomnia, unspecified: Secondary | ICD-10-CM | POA: Diagnosis not present

## 2018-02-26 DIAGNOSIS — K219 Gastro-esophageal reflux disease without esophagitis: Secondary | ICD-10-CM | POA: Diagnosis not present

## 2018-02-26 DIAGNOSIS — Z4789 Encounter for other orthopedic aftercare: Secondary | ICD-10-CM | POA: Diagnosis not present

## 2018-02-26 DIAGNOSIS — M4714 Other spondylosis with myelopathy, thoracic region: Secondary | ICD-10-CM | POA: Diagnosis not present

## 2018-02-26 DIAGNOSIS — Z9181 History of falling: Secondary | ICD-10-CM | POA: Diagnosis not present

## 2018-02-26 DIAGNOSIS — M797 Fibromyalgia: Secondary | ICD-10-CM | POA: Diagnosis not present

## 2018-02-26 DIAGNOSIS — Z87891 Personal history of nicotine dependence: Secondary | ICD-10-CM | POA: Diagnosis not present

## 2018-02-26 DIAGNOSIS — K5900 Constipation, unspecified: Secondary | ICD-10-CM | POA: Diagnosis not present

## 2018-02-26 DIAGNOSIS — I10 Essential (primary) hypertension: Secondary | ICD-10-CM | POA: Diagnosis not present

## 2018-02-26 DIAGNOSIS — Z96652 Presence of left artificial knee joint: Secondary | ICD-10-CM | POA: Diagnosis not present

## 2018-02-26 DIAGNOSIS — M1991 Primary osteoarthritis, unspecified site: Secondary | ICD-10-CM | POA: Diagnosis not present

## 2018-02-26 DIAGNOSIS — E785 Hyperlipidemia, unspecified: Secondary | ICD-10-CM | POA: Diagnosis not present

## 2018-02-27 DIAGNOSIS — E785 Hyperlipidemia, unspecified: Secondary | ICD-10-CM | POA: Diagnosis not present

## 2018-02-27 DIAGNOSIS — Z96652 Presence of left artificial knee joint: Secondary | ICD-10-CM | POA: Diagnosis not present

## 2018-02-27 DIAGNOSIS — M4714 Other spondylosis with myelopathy, thoracic region: Secondary | ICD-10-CM | POA: Diagnosis not present

## 2018-02-27 DIAGNOSIS — Z87891 Personal history of nicotine dependence: Secondary | ICD-10-CM | POA: Diagnosis not present

## 2018-02-27 DIAGNOSIS — M1991 Primary osteoarthritis, unspecified site: Secondary | ICD-10-CM | POA: Diagnosis not present

## 2018-02-27 DIAGNOSIS — I10 Essential (primary) hypertension: Secondary | ICD-10-CM | POA: Diagnosis not present

## 2018-02-27 DIAGNOSIS — K5900 Constipation, unspecified: Secondary | ICD-10-CM | POA: Diagnosis not present

## 2018-02-27 DIAGNOSIS — Z9181 History of falling: Secondary | ICD-10-CM | POA: Diagnosis not present

## 2018-02-27 DIAGNOSIS — M797 Fibromyalgia: Secondary | ICD-10-CM | POA: Diagnosis not present

## 2018-02-27 DIAGNOSIS — Z4789 Encounter for other orthopedic aftercare: Secondary | ICD-10-CM | POA: Diagnosis not present

## 2018-02-27 DIAGNOSIS — G4700 Insomnia, unspecified: Secondary | ICD-10-CM | POA: Diagnosis not present

## 2018-02-27 DIAGNOSIS — K219 Gastro-esophageal reflux disease without esophagitis: Secondary | ICD-10-CM | POA: Diagnosis not present

## 2018-03-01 DIAGNOSIS — M545 Low back pain: Secondary | ICD-10-CM | POA: Diagnosis not present

## 2018-03-07 DIAGNOSIS — Z4789 Encounter for other orthopedic aftercare: Secondary | ICD-10-CM | POA: Diagnosis not present

## 2018-03-07 DIAGNOSIS — M797 Fibromyalgia: Secondary | ICD-10-CM | POA: Diagnosis not present

## 2018-03-07 DIAGNOSIS — K219 Gastro-esophageal reflux disease without esophagitis: Secondary | ICD-10-CM | POA: Diagnosis not present

## 2018-03-07 DIAGNOSIS — Z87891 Personal history of nicotine dependence: Secondary | ICD-10-CM | POA: Diagnosis not present

## 2018-03-07 DIAGNOSIS — Z96652 Presence of left artificial knee joint: Secondary | ICD-10-CM | POA: Diagnosis not present

## 2018-03-07 DIAGNOSIS — Z9181 History of falling: Secondary | ICD-10-CM | POA: Diagnosis not present

## 2018-03-07 DIAGNOSIS — E785 Hyperlipidemia, unspecified: Secondary | ICD-10-CM | POA: Diagnosis not present

## 2018-03-07 DIAGNOSIS — G4700 Insomnia, unspecified: Secondary | ICD-10-CM | POA: Diagnosis not present

## 2018-03-07 DIAGNOSIS — M4714 Other spondylosis with myelopathy, thoracic region: Secondary | ICD-10-CM | POA: Diagnosis not present

## 2018-03-07 DIAGNOSIS — K5900 Constipation, unspecified: Secondary | ICD-10-CM | POA: Diagnosis not present

## 2018-03-07 DIAGNOSIS — M1991 Primary osteoarthritis, unspecified site: Secondary | ICD-10-CM | POA: Diagnosis not present

## 2018-03-07 DIAGNOSIS — I10 Essential (primary) hypertension: Secondary | ICD-10-CM | POA: Diagnosis not present

## 2018-03-09 ENCOUNTER — Emergency Department
Admission: EM | Admit: 2018-03-09 | Discharge: 2018-03-09 | Disposition: A | Discharge status: Home / Self Care | Payer: Medicare Other | Attending: Emergency Medicine | Admitting: Emergency Medicine

## 2018-03-09 ENCOUNTER — Other Ambulatory Visit: Payer: Self-pay

## 2018-03-09 ENCOUNTER — Emergency Department: Payer: Medicare Other

## 2018-03-09 ENCOUNTER — Encounter: Payer: Self-pay | Admitting: Emergency Medicine

## 2018-03-09 DIAGNOSIS — M25551 Pain in right hip: Secondary | ICD-10-CM | POA: Diagnosis not present

## 2018-03-09 DIAGNOSIS — R8271 Bacteriuria: Secondary | ICD-10-CM | POA: Diagnosis not present

## 2018-03-09 DIAGNOSIS — M25552 Pain in left hip: Secondary | ICD-10-CM | POA: Diagnosis not present

## 2018-03-09 DIAGNOSIS — M545 Low back pain: Secondary | ICD-10-CM | POA: Diagnosis not present

## 2018-03-09 DIAGNOSIS — M5442 Lumbago with sciatica, left side: Secondary | ICD-10-CM | POA: Diagnosis not present

## 2018-03-09 DIAGNOSIS — M5441 Lumbago with sciatica, right side: Secondary | ICD-10-CM

## 2018-03-09 DIAGNOSIS — R52 Pain, unspecified: Secondary | ICD-10-CM

## 2018-03-09 LAB — CBC
HCT: 39.3 % (ref 36.0–46.0)
HEMOGLOBIN: 12.9 g/dL (ref 12.0–15.0)
MCH: 29.3 pg (ref 26.0–34.0)
MCHC: 32.8 g/dL (ref 30.0–36.0)
MCV: 89.3 fL (ref 80.0–100.0)
NRBC: 0 % (ref 0.0–0.2)
PLATELETS: 311 10*3/uL (ref 150–400)
RBC: 4.4 MIL/uL (ref 3.87–5.11)
RDW: 13.1 % (ref 11.5–15.5)
WBC: 5.6 10*3/uL (ref 4.0–10.5)

## 2018-03-09 LAB — BASIC METABOLIC PANEL
ANION GAP: 8 (ref 5–15)
BUN: 8 mg/dL (ref 6–20)
CHLORIDE: 105 mmol/L (ref 98–111)
CO2: 26 mmol/L (ref 22–32)
Calcium: 8.8 mg/dL — ABNORMAL LOW (ref 8.9–10.3)
Creatinine, Ser: 0.69 mg/dL (ref 0.44–1.00)
GFR calc Af Amer: >60 mL/min (ref >60)
Glucose, Bld: 99 mg/dL (ref 70–99)
POTASSIUM: 4.9 mmol/L (ref 3.5–5.1)
Sodium: 139 mmol/L (ref 135–145)

## 2018-03-09 LAB — URINALYSIS, COMPLETE (UACMP) WITH MICROSCOPIC
Bilirubin Urine: NEGATIVE
GLUCOSE, UA: NEGATIVE mg/dL
Hgb urine dipstick: NEGATIVE
Ketones, ur: NEGATIVE mg/dL
Leukocytes, UA: NEGATIVE
NITRITE: NEGATIVE
PH: 7.5 (ref 5.0–8.0)
PROTEIN: NEGATIVE mg/dL
SPECIFIC GRAVITY, URINE: 1.02 (ref 1.005–1.030)
WBC, UA: NONE SEEN WBC/hpf (ref 0–5)

## 2018-03-09 LAB — POCT PREGNANCY, URINE: Preg Test, Ur: NEGATIVE

## 2018-03-09 MED ORDER — CEPHALEXIN 500 MG PO CAPS: 500.0000 mg | ORAL_CAPSULE | Freq: Two times a day (BID) | ORAL | 0 refills | Status: DC

## 2018-03-09 MED ORDER — OXYCODONE-ACETAMINOPHEN 5-325 MG PO TABS
1.0000 | ORAL_TABLET | Freq: Once | ORAL | Status: AC
  Administered 2018-03-09: 1 via ORAL
  Filled 2018-03-09: qty 1

## 2018-03-09 MED ORDER — ORPHENADRINE CITRATE 30 MG/ML IJ SOLN
60.0000 mg | Freq: Two times a day (BID) | INTRAMUSCULAR | Status: DC
  Administered 2018-03-09: 60 mg via INTRAMUSCULAR
  Filled 2018-03-09: qty 2

## 2018-03-09 MED ORDER — PREDNISONE 10 MG PO TABS: ORAL_TABLET | ORAL | 0 refills | Status: DC

## 2018-03-09 NOTE — ED Triage Notes (Signed)
Pt reports that she has ongoing bilat hip pain, pt reports that she had surgery august 2019 on her thoracic spine, states that the pain was prior to surg and was seen last weekend at urgent care receiving an injection, pt reports hx of fibromyalgia and states some arthritis but unsure where all it is located

## 2018-03-09 NOTE — ED Provider Notes (Signed)
[facility] Emergency Department Provider Note  ____________________________________________  Time seen: Approximately 7:25 PM  I have reviewed the triage vital signs and the nursing notes.   HISTORY  Chief Complaint Hip Pain    HPI Kristin Porter is a 57 y.o. female that presents emergency department for evaluation of bilateral low back pain for 2 weeks.  Pain begins on both sides of her low back and radiates into both of her buttocks.  Patient states that this feels like hip pain.  Patient denies any pain with sitting or laying down.  Pain is worse when she is up on her feet for long periods of time.  Patient has arthritis and fibromyalgia and states that this may be her fibromyalgia flaring.  Patient had T-spine surgery with Dr. Cari Caraway at the beginning of August.  No bowel or bladder dysfunction or saddle anesthesias.  She went to urgent care and was given a shot of Toradol and muscle relaxers, which is not helping.  She has not fallen.  No trauma. She has not felt ill. No fever, chills, nausea, vomiting, abdominal pain, weakness.  No past medical history on file.  There are no active problems to display for this patient.    Prior to Admission medications   Medication Sig Start Date End Date Taking? Authorizing Provider  cephALEXin (KEFLEX) 500 MG capsule Take 1 capsule (500 mg total) by mouth 2 (two) times daily. 03/09/18   Laban Emperor, PA-C  predniSONE (DELTASONE) 10 MG tablet Take 6 tablets on day 1, take 5 tablets on day 2, take 4 tablets on day 3, take 3 tablets on day 4, take 2 tablets on day 5, take 1 tablet on day 6 03/09/18   Laban Emperor, PA-C    Allergies Tizanidine; Lisinopril; and Tramadol  No family history on file.  Social History Social History   Tobacco Use  . Smoking status: Not on file  Substance Use Topics  . Alcohol use: Not on file  . Drug use: Not on file     Review of Systems  Constitutional: No  fever/chills Cardiovascular: No chest pain. Respiratory: No SOB. Gastrointestinal: No abdominal pain.  No nausea, no vomiting.  Musculoskeletal: Positive for back and hip pain. Skin: Negative for rash, abrasions, lacerations, ecchymosis. Neurological: Negative for headaches, numbness or tingling   ____________________________________________   PHYSICAL EXAM:  VITAL SIGNS: ED Triage Vitals  Enc Vitals Group     BP 03/09/18 1825 (!) 152/99     Pulse Rate 03/09/18 1825 100     Resp 03/09/18 1825 18     Temp 03/09/18 1825 98.4 F (36.9 C)     Temp Source 03/09/18 1825 Oral     SpO2 03/09/18 1825 98 %     Weight 03/09/18 1825 200 lb (90.7 kg)     Height 03/09/18 1825 4\' 11"  (1.499 m)     Head Circumference --      Peak Flow --      Pain Score 03/09/18 1830 10     Pain Loc --      Pain Edu? --      Excl. in [place]? --      Constitutional: Alert and oriented. Well appearing and in no acute distress. Eyes: Conjunctivae are normal. PERRL. EOMI. Head: Atraumatic. ENT:      Ears:      Nose: No congestion/rhinnorhea.      Mouth/Throat: Mucous membranes are moist.  Neck: No stridor.  No cervical spine tenderness to  palpation. Cardiovascular: Normal rate, regular rhythm.  Good peripheral circulation. Respiratory: Normal respiratory effort without tachypnea or retractions. Lungs CTAB. Good air entry to the bases with no decreased or absent breath sounds. Gastrointestinal: Bowel sounds 4 quadrants. Soft and nontender to palpation. No guarding or rigidity. No palpable masses. No distention.  Musculoskeletal: Full range of motion to all extremities. No gross deformities appreciated.  No tenderness to palpation over thoracic or lumbar spine.  Tenderness to palpation over bilateral lumbar paraspinal muscles.  Strength and sensation equal in lower extremities bilaterally.  Normal gait.  Patient appears comfortable. Neurologic:  Normal speech and language. No gross focal neurologic deficits are  appreciated.  Skin:  Skin is warm, dry and intact. No rash noted. Psychiatric: Mood and affect are normal. Speech and behavior are normal. Patient exhibits appropriate insight and judgement.   ____________________________________________   LABS (all labs ordered are listed, but only abnormal results are displayed)  Labs Reviewed  URINALYSIS, COMPLETE (UACMP) WITH MICROSCOPIC - Abnormal; Notable for the following components:      Result Value   Bacteria, UA RARE (*)    All other components within normal limits  BASIC METABOLIC PANEL - Abnormal; Notable for the following components:   Calcium 8.8 (*)    All other components within normal limits  CBC  POC URINE PREG, ED  POCT PREGNANCY, URINE   ____________________________________________  EKG   ____________________________________________  RADIOLOGY Robinette Haines, personally viewed and evaluated these images (plain radiographs) as part of my medical decision making, as well as reviewing the written report by the radiologist.  Dg Lumbar Spine 2-3 Views  Result Date: 03/09/2018 CLINICAL DATA:  Low back pain. EXAM: LUMBAR SPINE - 2-3 VIEW COMPARISON:  None. FINDINGS: There is no evidence of lumbar spine fracture. Alignment is normal. Intervertebral disc spaces are maintained. IMPRESSION: Negative. Electronically Signed   By: Marijo Conception, M.D.   On: 03/09/2018 20:44   Dg Hip Unilat With Pelvis 2-3 Views Left  Result Date: 03/09/2018 CLINICAL DATA:  Bilateral hip pain. EXAM: DG HIP (WITH OR WITHOUT PELVIS) 2-3V LEFT COMPARISON:  None. FINDINGS: There is no evidence of hip fracture or dislocation. There is no evidence of arthropathy or other focal bone abnormality. IMPRESSION: Negative. Electronically Signed   By: Marijo Conception, M.D.   On: 03/09/2018 20:44   Dg Hip Unilat With Pelvis 2-3 Views Right  Result Date: 03/09/2018 CLINICAL DATA:  Bilateral hip pain. EXAM: DG HIP (WITH OR WITHOUT PELVIS) 2-3V RIGHT COMPARISON:   None. FINDINGS: There is no evidence of hip fracture or dislocation. There is no evidence of arthropathy or other focal bone abnormality. IMPRESSION: Negative. Electronically Signed   By: Marijo Conception, M.D.   On: 03/09/2018 20:42    ____________________________________________    PROCEDURES  Procedure(s) performed:    Procedures    Medications  orphenadrine (NORFLEX) injection 60 mg (60 mg Intramuscular Given 03/09/18 2205)  oxyCODONE-acetaminophen (PERCOCET/ROXICET) 5-325 MG per tablet 1 tablet (1 tablet Oral Given 03/09/18 1945)     ____________________________________________   INITIAL IMPRESSION / ASSESSMENT AND PLAN / ED COURSE  Pertinent labs & imaging results that were available during my care of the patient were reviewed by me and considered in my medical decision making (see chart for details).  Review of the [place] CSRS was performed in accordance of the [place] prior to dispensing any controlled drugs.     Patient presented to the emergency department for evaluation of low back pain and  bilateral hip pain for 2 weeks.  Vital signs and exam are reassuring.  Hip and lumbar x-rays are negative.  This is unlikely infectious given patient's T-spine surgery was 2.5 months ago and patient has had low back pain for 2 weeks.  No fever or elevated white blood cell count.  Exam is unremarkable.  Patient appears well and comfortable.  Urinalysis shows rare bacteria. She will be covered for a UTI.  Patient will be discharged home with prescriptions for prednisone and Keflex. Patient is to follow up with neurosurgery as directed.  Patient is agreeable to call primary care area neurosurgery tomorrow.  Patient is given ED precautions to return to the ED for any worsening or new symptoms.     ____________________________________________  FINAL CLINICAL IMPRESSION(S) / ED DIAGNOSES  Final diagnoses:  Acute bilateral low back pain with bilateral sciatica  Bacteria in urine       NEW MEDICATIONS STARTED DURING THIS VISIT:  ED Discharge Orders         Ordered    predniSONE (DELTASONE) 10 MG tablet     03/09/18 2228    cephALEXin (KEFLEX) 500 MG capsule  2 times daily     03/09/18 2230              This chart was dictated using voice recognition software/Dragon. Despite best efforts to proofread, errors can occur which can change the meaning. Any change was purely unintentional.    Laban Emperor, PA-C 03/09/18 2330    Earleen Newport, MD 03/14/18 1356

## 2018-03-09 NOTE — ED Notes (Signed)
Pt reports  Both hips   History fibromyalgia  Pt reports flair up of pain x 2  Weeks  Pain radiates down thighs  Denies any injury

## 2018-03-12 DIAGNOSIS — Z9181 History of falling: Secondary | ICD-10-CM | POA: Diagnosis not present

## 2018-03-12 DIAGNOSIS — K5900 Constipation, unspecified: Secondary | ICD-10-CM | POA: Diagnosis not present

## 2018-03-12 DIAGNOSIS — Z4789 Encounter for other orthopedic aftercare: Secondary | ICD-10-CM | POA: Diagnosis not present

## 2018-03-12 DIAGNOSIS — M797 Fibromyalgia: Secondary | ICD-10-CM | POA: Diagnosis not present

## 2018-03-12 DIAGNOSIS — I10 Essential (primary) hypertension: Secondary | ICD-10-CM | POA: Diagnosis not present

## 2018-03-12 DIAGNOSIS — Z87891 Personal history of nicotine dependence: Secondary | ICD-10-CM | POA: Diagnosis not present

## 2018-03-12 DIAGNOSIS — M4714 Other spondylosis with myelopathy, thoracic region: Secondary | ICD-10-CM | POA: Diagnosis not present

## 2018-03-12 DIAGNOSIS — G4700 Insomnia, unspecified: Secondary | ICD-10-CM | POA: Diagnosis not present

## 2018-03-12 DIAGNOSIS — K219 Gastro-esophageal reflux disease without esophagitis: Secondary | ICD-10-CM | POA: Diagnosis not present

## 2018-03-12 DIAGNOSIS — M1991 Primary osteoarthritis, unspecified site: Secondary | ICD-10-CM | POA: Diagnosis not present

## 2018-03-12 DIAGNOSIS — E785 Hyperlipidemia, unspecified: Secondary | ICD-10-CM | POA: Diagnosis not present

## 2018-03-12 DIAGNOSIS — Z96652 Presence of left artificial knee joint: Secondary | ICD-10-CM | POA: Diagnosis not present

## 2018-03-18 DIAGNOSIS — M797 Fibromyalgia: Secondary | ICD-10-CM | POA: Diagnosis not present

## 2018-03-18 DIAGNOSIS — Z96652 Presence of left artificial knee joint: Secondary | ICD-10-CM | POA: Diagnosis not present

## 2018-03-18 DIAGNOSIS — Z4789 Encounter for other orthopedic aftercare: Secondary | ICD-10-CM | POA: Diagnosis not present

## 2018-03-18 DIAGNOSIS — K219 Gastro-esophageal reflux disease without esophagitis: Secondary | ICD-10-CM | POA: Diagnosis not present

## 2018-03-18 DIAGNOSIS — G4700 Insomnia, unspecified: Secondary | ICD-10-CM | POA: Diagnosis not present

## 2018-03-18 DIAGNOSIS — E785 Hyperlipidemia, unspecified: Secondary | ICD-10-CM | POA: Diagnosis not present

## 2018-03-18 DIAGNOSIS — I10 Essential (primary) hypertension: Secondary | ICD-10-CM | POA: Diagnosis not present

## 2018-03-18 DIAGNOSIS — K5900 Constipation, unspecified: Secondary | ICD-10-CM | POA: Diagnosis not present

## 2018-03-18 DIAGNOSIS — M4714 Other spondylosis with myelopathy, thoracic region: Secondary | ICD-10-CM | POA: Diagnosis not present

## 2018-03-18 DIAGNOSIS — Z9181 History of falling: Secondary | ICD-10-CM | POA: Diagnosis not present

## 2018-03-18 DIAGNOSIS — M1991 Primary osteoarthritis, unspecified site: Secondary | ICD-10-CM | POA: Diagnosis not present

## 2018-03-18 DIAGNOSIS — Z87891 Personal history of nicotine dependence: Secondary | ICD-10-CM | POA: Diagnosis not present

## 2018-04-09 DIAGNOSIS — M545 Low back pain: Secondary | ICD-10-CM | POA: Diagnosis not present

## 2018-04-09 DIAGNOSIS — G8929 Other chronic pain: Secondary | ICD-10-CM | POA: Diagnosis not present

## 2018-04-10 DIAGNOSIS — G950 Syringomyelia and syringobulbia: Secondary | ICD-10-CM | POA: Diagnosis not present

## 2018-04-10 DIAGNOSIS — M25552 Pain in left hip: Secondary | ICD-10-CM | POA: Diagnosis not present

## 2018-04-10 DIAGNOSIS — M25551 Pain in right hip: Secondary | ICD-10-CM | POA: Diagnosis not present

## 2018-04-14 DIAGNOSIS — K219 Gastro-esophageal reflux disease without esophagitis: Secondary | ICD-10-CM | POA: Diagnosis not present

## 2018-04-14 DIAGNOSIS — G8929 Other chronic pain: Secondary | ICD-10-CM | POA: Diagnosis not present

## 2018-04-14 DIAGNOSIS — I10 Essential (primary) hypertension: Secondary | ICD-10-CM | POA: Diagnosis not present

## 2018-04-14 DIAGNOSIS — M25551 Pain in right hip: Secondary | ICD-10-CM | POA: Diagnosis not present

## 2018-04-14 DIAGNOSIS — M25552 Pain in left hip: Secondary | ICD-10-CM | POA: Diagnosis not present

## 2018-04-14 DIAGNOSIS — G950 Syringomyelia and syringobulbia: Secondary | ICD-10-CM | POA: Diagnosis not present

## 2018-04-14 DIAGNOSIS — I493 Ventricular premature depolarization: Secondary | ICD-10-CM | POA: Diagnosis not present

## 2018-04-16 DIAGNOSIS — G8929 Other chronic pain: Secondary | ICD-10-CM | POA: Insufficient documentation

## 2018-04-16 DIAGNOSIS — M25551 Pain in right hip: Secondary | ICD-10-CM

## 2018-04-16 DIAGNOSIS — M25552 Pain in left hip: Secondary | ICD-10-CM

## 2018-04-23 DIAGNOSIS — M545 Low back pain: Secondary | ICD-10-CM | POA: Diagnosis not present

## 2018-04-23 DIAGNOSIS — G8929 Other chronic pain: Secondary | ICD-10-CM | POA: Diagnosis not present

## 2018-04-24 DIAGNOSIS — Z1239 Encounter for other screening for malignant neoplasm of breast: Secondary | ICD-10-CM | POA: Diagnosis not present

## 2018-04-24 DIAGNOSIS — R232 Flushing: Secondary | ICD-10-CM | POA: Diagnosis not present

## 2018-04-24 DIAGNOSIS — Z23 Encounter for immunization: Secondary | ICD-10-CM | POA: Diagnosis not present

## 2018-04-24 DIAGNOSIS — I10 Essential (primary) hypertension: Secondary | ICD-10-CM | POA: Diagnosis not present

## 2018-04-24 DIAGNOSIS — R7303 Prediabetes: Secondary | ICD-10-CM | POA: Diagnosis not present

## 2018-04-29 DIAGNOSIS — G8929 Other chronic pain: Secondary | ICD-10-CM | POA: Diagnosis not present

## 2018-04-29 DIAGNOSIS — M545 Low back pain: Secondary | ICD-10-CM | POA: Diagnosis not present

## 2018-05-02 DIAGNOSIS — M545 Low back pain: Secondary | ICD-10-CM | POA: Diagnosis not present

## 2018-05-02 DIAGNOSIS — G8929 Other chronic pain: Secondary | ICD-10-CM | POA: Diagnosis not present

## 2018-05-29 DIAGNOSIS — M25551 Pain in right hip: Secondary | ICD-10-CM | POA: Diagnosis not present

## 2018-05-29 DIAGNOSIS — M160 Bilateral primary osteoarthritis of hip: Secondary | ICD-10-CM | POA: Diagnosis not present

## 2018-05-29 DIAGNOSIS — M25552 Pain in left hip: Secondary | ICD-10-CM | POA: Diagnosis not present

## 2018-05-30 DIAGNOSIS — M545 Low back pain: Secondary | ICD-10-CM | POA: Diagnosis not present

## 2018-05-30 DIAGNOSIS — G8929 Other chronic pain: Secondary | ICD-10-CM | POA: Diagnosis not present

## 2018-06-07 DIAGNOSIS — J209 Acute bronchitis, unspecified: Secondary | ICD-10-CM | POA: Diagnosis not present

## 2018-06-13 DIAGNOSIS — G8929 Other chronic pain: Secondary | ICD-10-CM | POA: Diagnosis not present

## 2018-06-13 DIAGNOSIS — M25552 Pain in left hip: Secondary | ICD-10-CM | POA: Diagnosis not present

## 2018-06-13 DIAGNOSIS — M25551 Pain in right hip: Secondary | ICD-10-CM | POA: Diagnosis not present

## 2018-06-13 DIAGNOSIS — K219 Gastro-esophageal reflux disease without esophagitis: Secondary | ICD-10-CM | POA: Diagnosis not present

## 2018-06-13 DIAGNOSIS — I10 Essential (primary) hypertension: Secondary | ICD-10-CM | POA: Diagnosis not present

## 2018-06-23 DIAGNOSIS — G8929 Other chronic pain: Secondary | ICD-10-CM | POA: Diagnosis not present

## 2018-06-23 DIAGNOSIS — M545 Low back pain: Secondary | ICD-10-CM | POA: Diagnosis not present

## 2018-06-26 DIAGNOSIS — M4714 Other spondylosis with myelopathy, thoracic region: Secondary | ICD-10-CM | POA: Diagnosis not present

## 2018-06-26 DIAGNOSIS — G8929 Other chronic pain: Secondary | ICD-10-CM | POA: Diagnosis not present

## 2018-07-04 DIAGNOSIS — S3981XA Other specified injuries of abdomen, initial encounter: Secondary | ICD-10-CM | POA: Diagnosis not present

## 2018-07-04 DIAGNOSIS — M25552 Pain in left hip: Secondary | ICD-10-CM | POA: Diagnosis not present

## 2018-07-04 DIAGNOSIS — M1611 Unilateral primary osteoarthritis, right hip: Secondary | ICD-10-CM | POA: Diagnosis not present

## 2018-07-04 DIAGNOSIS — M7062 Trochanteric bursitis, left hip: Secondary | ICD-10-CM | POA: Diagnosis not present

## 2018-07-04 DIAGNOSIS — M25551 Pain in right hip: Secondary | ICD-10-CM | POA: Diagnosis not present

## 2018-09-04 DIAGNOSIS — S3981XD Other specified injuries of abdomen, subsequent encounter: Secondary | ICD-10-CM | POA: Diagnosis not present

## 2018-09-04 DIAGNOSIS — M7062 Trochanteric bursitis, left hip: Secondary | ICD-10-CM | POA: Diagnosis not present

## 2018-09-04 DIAGNOSIS — M1612 Unilateral primary osteoarthritis, left hip: Secondary | ICD-10-CM | POA: Diagnosis not present

## 2018-09-04 DIAGNOSIS — M25551 Pain in right hip: Secondary | ICD-10-CM | POA: Diagnosis not present

## 2018-09-04 DIAGNOSIS — M1611 Unilateral primary osteoarthritis, right hip: Secondary | ICD-10-CM | POA: Diagnosis not present

## 2018-09-08 ENCOUNTER — Ambulatory Visit
Payer: Medicare Other | Attending: Student in an Organized Health Care Education/Training Program | Admitting: Student in an Organized Health Care Education/Training Program

## 2018-09-08 ENCOUNTER — Other Ambulatory Visit: Payer: Self-pay

## 2018-09-08 DIAGNOSIS — M170 Bilateral primary osteoarthritis of knee: Secondary | ICD-10-CM

## 2018-09-08 DIAGNOSIS — M4714 Other spondylosis with myelopathy, thoracic region: Secondary | ICD-10-CM

## 2018-09-08 DIAGNOSIS — M797 Fibromyalgia: Secondary | ICD-10-CM

## 2018-09-08 DIAGNOSIS — M25551 Pain in right hip: Secondary | ICD-10-CM | POA: Diagnosis not present

## 2018-09-08 DIAGNOSIS — G8929 Other chronic pain: Secondary | ICD-10-CM

## 2018-09-08 DIAGNOSIS — M545 Low back pain, unspecified: Secondary | ICD-10-CM

## 2018-09-08 DIAGNOSIS — Z96652 Presence of left artificial knee joint: Secondary | ICD-10-CM | POA: Diagnosis not present

## 2018-09-08 DIAGNOSIS — F339 Major depressive disorder, recurrent, unspecified: Secondary | ICD-10-CM

## 2018-09-08 DIAGNOSIS — M47817 Spondylosis without myelopathy or radiculopathy, lumbosacral region: Secondary | ICD-10-CM

## 2018-09-08 DIAGNOSIS — M25552 Pain in left hip: Secondary | ICD-10-CM

## 2018-09-08 MED ORDER — PREGABALIN 75 MG PO CAPS: 75.0000 mg | ORAL_CAPSULE | Freq: Three times a day (TID) | ORAL | 1 refills | Status: DC

## 2018-09-08 NOTE — Progress Notes (Signed)
Patient's Name: Kristin Porter  MRN: [id number]  Referring Provider: Meade Maw, MD  DOB: [birth year] Porter  PCP: Kristin Lange, NP  DOS: 09/08/2018  Note by: Kristin Santa, MD  Service setting: Ambulatory outpatient  Specialty: Interventional Pain Management  Location: [place]  Porter type: Initial Patient Evaluation  Patient type: New Patient   Pain Management Virtual Encounter Note - Virtual Porter via Between (real-time audio visits between healthcare provider and patient).  Patient's Phone No.:  [phone] (home); There is no such number on file (mobile).; (Preferred) [phone] No e-mail address on record  [place] [address] Phone: [phone] Fax: [phone]   Pre-screening note:  Our staff contacted Kristin Porter and offered her an "in person", "face-to-face" appointment versus a telephone encounter. She indicated preferring the telephone encounter, at this time.  Primary Reason(s) for Porter: Tele-Encounter for initial evaluation of one or more chronic problems (new to examiner) potentially causing chronic pain, and posing a threat to normal musculoskeletal function. (Level of risk: High)  CC: back pain and hip pain  I contacted Kristin Porter on 09/08/2018 at 1:16 PM via video conference and clearly identified myself as Kristin Santa, MD. I verified that I was speaking with the correct person using two identifiers (Name and date of birth: [birth year] Porter).  Advanced Informed Consent I sought verbal advanced consent from Kristin Porter for virtual Porter interactions. I informed Kristin Porter of possible security and privacy concerns, risks, and limitations associated with providing "not-in-person" medical evaluation and management services. I also informed Kristin Porter of the availability of "in-person"  appointments. Finally, I informed her that there would be a charge for the virtual Porter and that she could be  personally, fully or partially, financially responsible for it. [name] expressed understanding and agreed to proceed.   HPI   Pain Assessment: Location:    Back pain, hip pain, knee pain Radiating:  Yes back pain radiates to hips and knees Onset:  >3 years ago Duration:  present for most of the day Quality:   Constant, cramping, disabling, distressing, exhausting, nagging, pressure-like Severity:  4/10 (subjective, self-reported pain score)   Effect on ADL:  Limits ability to walk and do household chores Timing:  Worse with weather changes, rain and in the evening Modifying factors:  Improves with rest, heat, lying down BP:    HR:    Onset and Duration: Date of onset: 2006 and Present longer than 3 months Cause of pain: fibromyalgia Severity: Getting worse, NAS-11 at its worse: 10/10, NAS-11 at its best: 8/10, NAS-11 now: 4/10 and NAS-11 on the average: 8/10 Timing: Night, During activity or exercise and After a period of immobility Aggravating Factors: Bending, Climbing, Prolonged sitting, Prolonged standing, Squatting, Stooping , Walking and Walking uphill Alleviating Factors: Lying down, Medications and Resting Associated Problems: Day-time cramps, Night-time cramps, Depression, Fatigue, Numbness, Spasms, Vomiting , Weakness, Pain that wakes patient up and Pain that does not allow patient to sleep Quality of Pain: Agonizing, Constant, Cramping, Cruel, Deep, Disabling, Distressing, Dreadful, Dull, Exhausting, Feeling of constriction, Hot, Nagging, Pressure-like, Pulsating, Punishing, Sharp, Stabbing, Superficial, Tender, Throbbing, Tingling, Tiring and Toothache-like Previous Examinations or Tests: Neurosurgical evaluation Previous Treatments: Trigger point injections  The patient comes into the clinics today for the first time for a chronic pain management evaluation.    58 year old female with a history of fibromyalgia, GERD, hypertension, history  of thoracic spine surgery related to thoracic myelopathy associated with syrinx.  Patient is thoracic spine surgery was on 12/23/2017.  This was done by Kristin Porter.  She states that this thoracic surgery was helpful for her thoracic myelopathy.  Patient endorses ongoing right hip pain.  She was previously being seen by the pain clinic at [facility] (w Kristin [place]) where she would receive occasional injections which became less effective over time.  She would like to move her management closer to her home in [place].  She is currently not on any opioid medications.   Historic Controlled Substance Pharmacotherapy Review  Historical Background Evaluation: [place] PMP: PDMP reviewed during this encounter. Six (6) year initial data search conducted.             [place]ristin [place] Department of public safety, offender search: Editor, commissioning Information) Non-contributory Risk Assessment Profile: Aberrant behavior: None observed or detected today Risk factors for fatal opioid overdose: None identified today Fatal overdose hazard ratio (HR): Calculation deferred Non-fatal overdose hazard ratio (HR): Calculation deferred Risk of opioid abuse or dependence: 0.7-3.0% with doses ? 36 MME/day and 6.1-26% with doses ? 120 MME/day. Substance use disorder (SUD) risk level: Low   Pharmacologic Plan: As per protocol, I have not taken over any controlled substance management, pending the results of ordered tests and/or consults.            Initial impression: Pending review of available data and ordered tests.  Meds   Current Outpatient Medications:  .  albuterol (PROVENTIL) (2.5 MG/3ML) 0.083% nebulizer solution, Take 2.5 mg by nebulization every 6 (six) hours as needed for wheezing or shortness of breath., Disp: , Rfl:  .  amLODipine (NORVASC) 5 MG tablet, Take 5 mg by mouth daily., Disp: , Rfl:  .  baclofen (LIORESAL) 10 MG tablet, Take 10 mg by mouth 3  (three) times daily., Disp: , Rfl:  .  cyclobenzaprine (FLEXERIL) 10 MG tablet, Take 10 mg by mouth 3 (three) times daily., Disp: , Rfl:  .  fluticasone (FLONASE) 50 MCG/ACT nasal spray, Place 1 spray into both nostrils 2 (two) times daily., Disp: , Rfl:  .  PARoxetine (PAXIL) 10 MG tablet, Take 10 mg by mouth daily., Disp: , Rfl:  .  pregabalin (LYRICA) 75 MG capsule, Take 1 capsule (75 mg total) by mouth 3 (three) times daily., Disp: 90 capsule, Rfl: 1 .  traZODone (DESYREL) 50 MG tablet, Take 50 mg by mouth at bedtime., Disp: , Rfl:   ROS  Cardiovascular: High blood pressure Pulmonary or Respiratory: No reported pulmonary signs or symptoms such as wheezing and difficulty taking a deep full breath (Asthma), difficulty blowing air out (Emphysema), coughing up mucus (Bronchitis), persistent dry cough, or temporary stoppage of breathing during sleep Neurological: No reported neurological signs or symptoms such as seizures, abnormal skin sensations, urinary and/or fecal incontinence, being born with an abnormal open spine and/or a tethered spinal cord Review of Past Neurological Studies: No results found for this or any previous Porter. Psychological-Psychiatric: Anxiousness and Depressed Gastrointestinal: Reflux or heatburn and Alternating episodes iof diarrhea and constipation (IBS-Irritable bowe syndrome) Genitourinary: No reported renal or genitourinary signs or symptoms such as difficulty voiding or producing urine, peeing blood, non-functioning kidney, kidney stones, difficulty emptying the bladder, difficulty controlling the flow of urine, or chronic kidney disease Hematological: No reported hematological signs or symptoms such as prolonged bleeding, low or poor functioning platelets, bruising or bleeding easily, hereditary bleeding problems, low energy levels due to low hemoglobin or being  anemic Endocrine: No reported endocrine signs or symptoms such as high or low blood sugar, rapid heart rate  due to high thyroid levels, obesity or weight gain due to slow thyroid or thyroid disease Rheumatologic: Joint aches and or swelling due to excess weight (Osteoarthritis) and Generalized muscle aches (Fibromyalgia) Musculoskeletal: Negative for myasthenia gravis, muscular dystrophy, multiple sclerosis or malignant hyperthermia Work History: Disabled and Quit going to work on his/her own  Allergies  Ms. Dinh is allergic to tizanidine; lisinopril; and tramadol.  Laboratory Chemistry  Inflammation Markers (CRP: Acute Phase) (ESR: Chronic Phase) No results found for: CRP, ESRSEDRATE, LATICACIDVEN                       Rheumatology Markers No results found for: RF, ANA, LABURIC, URICUR, LYMEIGGIGMAB, LYMEABIGMQN, HLAB27                      Renal Function Markers Lab Results  Component Value Date   BUN 8 03/09/2018   CREATININE 0.69 03/09/2018   GFRAA >60 03/09/2018   GFRNONAA >60 03/09/2018                             Hepatic Function Markers No results found for: AST, ALT, ALBUMIN, ALKPHOS, HCVAB, AMYLASE, LIPASE, AMMONIA                      Electrolytes Lab Results  Component Value Date   NA 139 03/09/2018   K 4.9 03/09/2018   CL 105 03/09/2018   CALCIUM 8.8 (L) 03/09/2018                          Coagulation Parameters Lab Results  Component Value Date   PLT 311 03/09/2018                        Cardiovascular Markers Lab Results  Component Value Date   HGB 12.9 03/09/2018   HCT 39.3 03/09/2018                           Note: Lab results reviewed.  Imaging Review  Lumbar DG 2-3 views:  Results for orders placed during the hospital encounter of 03/09/18  DG Lumbar Spine 2-3 Views   Narrative CLINICAL DATA:  Low back pain.  EXAM: LUMBAR SPINE - 2-3 VIEW  COMPARISON:  None.  FINDINGS: There is no evidence of lumbar spine fracture. Alignment is normal. Intervertebral disc spaces are maintained.  IMPRESSION: Negative.   Electronically Signed    By: Marijo Conception, M.D.   On: 03/09/2018 20:44    Results for orders placed during the hospital encounter of 03/09/18  DG HIP UNILAT WITH PELVIS 2-3 VIEWS RIGHT   Narrative CLINICAL DATA:  Bilateral hip pain.  EXAM: DG HIP (WITH OR WITHOUT PELVIS) 2-3V RIGHT  COMPARISON:  None.  FINDINGS: There is no evidence of hip fracture or dislocation. There is no evidence of arthropathy or other focal bone abnormality.  IMPRESSION: Negative.   Electronically Signed   By: Marijo Conception, M.D.   On: 03/09/2018 20:42    Hip-L DG 2-3 views:  Results for orders placed during the hospital encounter of 03/09/18  DG HIP UNILAT WITH PELVIS 2-3 VIEWS LEFT   Narrative CLINICAL DATA:  Bilateral hip pain.  EXAM:  DG HIP (WITH OR WITHOUT PELVIS) 2-3V LEFT  COMPARISON:  None.  FINDINGS: There is no evidence of hip fracture or dislocation. There is no evidence of arthropathy or other focal bone abnormality.  IMPRESSION: Negative.   Electronically Signed   By: Marijo Conception, M.D.   On: 03/09/2018 20:44     Complexity Note: Imaging results reviewed. Results shared with Ms. Friel, using Layman's terms.                         PFSH  Drug: Ms. Gervase  has no history on file for drug. Alcohol:  has no history on file for alcohol. Tobacco:  has no history on file for tobacco. Medical:  has no past medical history on file. Family: family history is not on file.   Active Ambulatory Problems    Diagnosis Date Noted  . Chronic pain of both hips 04/16/2018  . Depression 10/30/2011  . Fibromyalgia 12/01/2010  . GERD (gastroesophageal reflux disease) 06/19/2012  . History of total knee arthroplasty, left 08/16/2014  . Low back pain 02/18/2012  . Lumbosacral spondylosis without myelopathy 09/05/2016  . Multiple joint pain 12/09/2013  . OA (osteoarthritis) of knee 07/30/2012  . Syrinx of spinal cord ([place]) 05/10/2017  . Thoracic myelopathy 12/26/2017   Resolved Ambulatory  Problems    Diagnosis Date Noted  . No Resolved Ambulatory Problems   No Additional Past Medical History   Assessment  Primary Diagnosis & Pertinent Problem List: The primary encounter diagnosis was Thoracic myelopathy (due to syrinx, s/p excision 12/23/2017). Diagnoses of Fibromyalgia, Episode of recurrent major depressive disorder, unspecified depression episode severity ([place]), Chronic pain of both hips, History of total knee arthroplasty, left, Chronic bilateral low back pain without sciatica, Lumbosacral spondylosis without myelopathy, and Primary osteoarthritis of both knees were also pertinent to this Porter.  Porter Diagnosis (New problems to examiner): 1. Thoracic myelopathy (due to syrinx, s/p excision 12/23/2017)   2. Fibromyalgia   3. Episode of recurrent major depressive disorder, unspecified depression episode severity ([place])   4. Chronic pain of both hips   5. History of total knee arthroplasty, left   6. Chronic bilateral low back pain without sciatica   7. Lumbosacral spondylosis without myelopathy   8. Primary osteoarthritis of both knees    Discontinue Flexeril.  Continue baclofen 10 mg 3 times daily as needed.  Recommend increasing Lyrica 75 mg 3 times daily to help out with fibromyalgia symptoms.  Recommend follow-up, virtually  in 1 month to review symptoms after increasing Lyrica.   Plan of Care (Initial workup plan)  Note: Ms. Schooler was reminded that as per protocol, today's Porter has been an evaluation only. We have not taken over the patient's controlled substance management.  Pharmacotherapy (current): Medications ordered:  Meds ordered this encounter  Medications  . pregabalin (LYRICA) 75 MG capsule    Sig: Take 1 capsule (75 mg total) by mouth 3 (three) times daily.    Dispense:  90 capsule    Refill:  1   Medications administered during this Porter: Marry O. Borelli had no medications administered during this Porter.   Pharmacological management options:   Opioid Analgesics: The patient was informed that there is no guarantee that she would be a candidate for opioid analgesics. The decision will be made following CDC guidelines. This decision will be based on the results of diagnostic studies, as well as Ms. Xue's risk profile.   Membrane stabilizer: To be determined  at a later time  Muscle relaxant: To be determined at a later time  NSAID: To be determined at a later time  Other analgesic(s): To be determined at a later time   Interventional management options: Ms. Belknap was informed that there is no guarantee that she would be a candidate for interventional therapies. The decision will be based on the results of diagnostic studies, as well as Ms. Rasco's risk profile.  Procedure(s) under consideration:  Intra-articular hip steroid injection SI joint injection Genicular nerve block Genicular nerve radiofrequency ablation    Provider-requested follow-up: No follow-ups on file.  No future appointments.  Primary Care Physician: Kristin Lange, NP Location: [facility] Facility Note by: Kristin Santa, MD Date: 09/08/2018; Time: 1:16 PM

## 2018-09-08 NOTE — Progress Notes (Deleted)
Patient's Name: Kristin Porter  MRN: [id number]  Referring Provider: Meade Maw, MD  DOB: [birth year]  PCP: Sallee Lange, NP  DOS: 09/08/2018  Note by: Gillis Santa, MD  Service setting: Ambulatory outpatient  Specialty: Interventional Pain Management  Location: [facility] (AMB) Pain Management Facility  Visit type: Initial Patient Evaluation  Patient type: New Patient   Primary Reason(s) for Visit: Encounter for initial evaluation of one or more chronic problems (new to examiner) potentially causing chronic pain, and posing a threat to normal musculoskeletal function. (Level of risk: High) CC: No chief complaint on file.  HPI  Kristin Porter is a 58 y.o. year old, female patient, who comes today to see [place] for the first time for an initial evaluation of her chronic pain. She has Chronic pain of both hips; Depression; Fibromyalgia; GERD (gastroesophageal reflux disease); History of total knee arthroplasty, left; Low back pain; Lumbosacral spondylosis without myelopathy; Multiple joint pain; OA (osteoarthritis) of knee; Syrinx of spinal cord ([place]); and Thoracic myelopathy on their problem list. Today she comes in for evaluation of her No chief complaint on file.  Pain Assessment: Location:    Back pain, hip pain, knee pain Radiating:  Yes back pain radiates to hips and knees Onset:  >3 years ago Duration:  present for most of the day Quality:   Constant, cramping, disabling, distressing, exhausting, nagging, pressure-like Severity:  4/10 (subjective, self-reported pain score)   Effect on ADL:  Limits ability to walk and do household chores Timing:  Worse with weather changes, rain and in the evening Modifying factors:  Improves with rest, heat, lying down BP:    HR:    Onset and Duration: Date of onset: 2006 and Present longer than 3 months Cause of pain: fibromyalgia Severity: Getting worse, NAS-11 at its worse: 10/10, NAS-11 at its best: 8/10, NAS-11 now: 4/10 and NAS-11 on  the average: 8/10 Timing: Night, During activity or exercise and After a period of immobility Aggravating Factors: Bending, Climbing, Prolonged sitting, Prolonged standing, Squatting, Stooping , Walking and Walking uphill Alleviating Factors: Lying down, Medications and Resting Associated Problems: Day-time cramps, Night-time cramps, Depression, Fatigue, Numbness, Spasms, Vomiting , Weakness, Pain that wakes patient up and Pain that does not allow patient to sleep Quality of Pain: Agonizing, Constant, Cramping, Cruel, Deep, Disabling, Distressing, Dreadful, Dull, Exhausting, Feeling of constriction, Hot, Nagging, Pressure-like, Pulsating, Punishing, Sharp, Stabbing, Superficial, Tender, Throbbing, Tingling, Tiring and Toothache-like Previous Examinations or Tests: Neurosurgical evaluation Previous Treatments: Trigger point injections  The patient comes into the clinics today for the first time for a chronic pain management evaluation.   58 year old female with a history of fibromyalgia, GERD, hypertension, history of thoracic spine surgery related to thoracic myelopathy associated with syrinx.  Patient is thoracic spine surgery was on 12/23/2017.  This was done by Dr. Cari Caraway.  She states that this thoracic surgery was helpful for her thoracic myelopathy.  Patient endorses ongoing right hip pain.  She was previously being seen by the pain clinic at [facility] where she would receive occasional injections which became less effective over time.  She would like to move her management closer to her home in [place].  She is currently not on any opioid medications.   Historic Controlled Substance Pharmacotherapy Review  Historical Background Evaluation: [place] PMP: PDMP reviewed during this encounter. Six (6) year initial data search conducted.             [place] Department of public safety, offender search: Editor, commissioning Information) Non-contributory Risk Assessment  Profile: Aberrant behavior: None observed or  detected today Risk factors for fatal opioid overdose: None identified today Fatal overdose hazard ratio (HR): Calculation deferred Non-fatal overdose hazard ratio (HR): Calculation deferred Risk of opioid abuse or dependence: 0.7-3.0% with doses ? 36 MME/day and 6.1-26% with doses ? 120 MME/day. Substance use disorder (SUD) risk level: Low   Pharmacologic Plan: As per protocol, I have not taken over any controlled substance management, pending the results of ordered tests and/or consults.            Initial impression: Pending review of available data and ordered tests.  Meds   Current Outpatient Medications:  .  albuterol (PROVENTIL) (2.5 MG/3ML) 0.083% nebulizer solution, Take 2.5 mg by nebulization every 6 (six) hours as needed for wheezing or shortness of breath., Disp: , Rfl:  .  amLODipine (NORVASC) 5 MG tablet, Take 5 mg by mouth daily., Disp: , Rfl:  .  baclofen (LIORESAL) 10 MG tablet, Take 10 mg by mouth 3 (three) times daily., Disp: , Rfl:  .  cyclobenzaprine (FLEXERIL) 10 MG tablet, Take 10 mg by mouth 3 (three) times daily., Disp: , Rfl:  .  fluticasone (FLONASE) 50 MCG/ACT nasal spray, Place 1 spray into both nostrils 2 (two) times daily., Disp: , Rfl:  .  PARoxetine (PAXIL) 10 MG tablet, Take 10 mg by mouth daily., Disp: , Rfl:  .  pregabalin (LYRICA) 75 MG capsule, Take 75 mg by mouth 2 (two) times daily., Disp: , Rfl:  .  traZODone (DESYREL) 50 MG tablet, Take 50 mg by mouth at bedtime., Disp: , Rfl:   Imaging Review    Lumbar DG 2-3 views:  Results for orders placed during the hospital encounter of 03/09/18  DG Lumbar Spine 2-3 Views   Narrative CLINICAL DATA:  Low back pain.  EXAM: LUMBAR SPINE - 2-3 VIEW  COMPARISON:  None.  FINDINGS: There is no evidence of lumbar spine fracture. Alignment is normal. Intervertebral disc spaces are maintained.  IMPRESSION: Negative.   Electronically Signed   By: Marijo Conception, M.D.   On: 03/09/2018 20:44      Results for orders placed during the hospital encounter of 03/09/18  DG HIP UNILAT WITH PELVIS 2-3 VIEWS RIGHT   Narrative CLINICAL DATA:  Bilateral hip pain.  EXAM: DG HIP (WITH OR WITHOUT PELVIS) 2-3V RIGHT  COMPARISON:  None.  FINDINGS: There is no evidence of hip fracture or dislocation. There is no evidence of arthropathy or other focal bone abnormality.  IMPRESSION: Negative.   Electronically Signed   By: Marijo Conception, M.D.   On: 03/09/2018 20:42    Hip-L DG 2-3 views:  Results for orders placed during the hospital encounter of 03/09/18  DG HIP UNILAT WITH PELVIS 2-3 VIEWS LEFT   Narrative CLINICAL DATA:  Bilateral hip pain.  EXAM: DG HIP (WITH OR WITHOUT PELVIS) 2-3V LEFT  COMPARISON:  None.  FINDINGS: There is no evidence of hip fracture or dislocation. There is no evidence of arthropathy or other focal bone abnormality.  IMPRESSION: Negative.   Electronically Signed   By: Marijo Conception, M.D.   On: 03/09/2018 20:44    Complexity Note: Imaging results reviewed. Results shared with Kristin Porter, using Layman's terms.                         ROS  Cardiovascular: High blood pressure Pulmonary or Respiratory: No reported pulmonary signs or symptoms such as wheezing and  difficulty taking a deep full breath (Asthma), difficulty blowing air out (Emphysema), coughing up mucus (Bronchitis), persistent dry cough, or temporary stoppage of breathing during sleep Neurological: No reported neurological signs or symptoms such as seizures, abnormal skin sensations, urinary and/or fecal incontinence, being born with an abnormal open spine and/or a tethered spinal cord Review of Past Neurological Studies: No results found for this or any previous visit. Psychological-Psychiatric: Anxiousness and Depressed Gastrointestinal: Reflux or heatburn and Alternating episodes iof diarrhea and constipation (IBS-Irritable bowe syndrome) Genitourinary: No reported renal or  genitourinary signs or symptoms such as difficulty voiding or producing urine, peeing blood, non-functioning kidney, kidney stones, difficulty emptying the bladder, difficulty controlling the flow of urine, or chronic kidney disease Hematological: No reported hematological signs or symptoms such as prolonged bleeding, low or poor functioning platelets, bruising or bleeding easily, hereditary bleeding problems, low energy levels due to low hemoglobin or being anemic Endocrine: No reported endocrine signs or symptoms such as high or low blood sugar, rapid heart rate due to high thyroid levels, obesity or weight gain due to slow thyroid or thyroid disease Rheumatologic: Joint aches and or swelling due to excess weight (Osteoarthritis) and Generalized muscle aches (Fibromyalgia) Musculoskeletal: Negative for myasthenia gravis, muscular dystrophy, multiple sclerosis or malignant hyperthermia Work History: Disabled and Quit going to work on his/her own  Allergies  Kristin Porter is allergic to tizanidine; lisinopril; and tramadol.  Laboratory Chemistry  Inflammation Markers (CRP: Acute Phase) (ESR: Chronic Phase) No results found for: CRP, ESRSEDRATE, LATICACIDVEN                       Rheumatology Markers No results found for: RF, ANA, LABURIC, URICUR, LYMEIGGIGMAB, LYMEABIGMQN, HLAB27                      Renal Function Markers Lab Results  Component Value Date   BUN 8 03/09/2018   CREATININE 0.69 03/09/2018   GFRAA >60 03/09/2018   GFRNONAA >60 03/09/2018                             Hepatic Function Markers No results found for: AST, ALT, ALBUMIN, ALKPHOS, HCVAB, AMYLASE, LIPASE, AMMONIA                      Electrolytes Lab Results  Component Value Date   NA 139 03/09/2018   K 4.9 03/09/2018   CL 105 03/09/2018   CALCIUM 8.8 (L) 03/09/2018                        Neuropathy Markers No results found for: VITAMINB12, FOLATE, HGBA1C, HIV                      CNS Tests No results  found for: COLORCSF, APPEARCSF, RBCCOUNTCSF, WBCCSF, POLYSCSF, LYMPHSCSF, EOSCSF, PROTEINCSF, GLUCCSF, JCVIRUS, CSFOLI, IGGCSF, LABACHR, ACETBL                      Bone Pathology Markers No results found for: VD25OH, [id number], [id number], [id number], 25OHVITD1, 25OHVITD2, 25OHVITD3, TESTOFREE, TESTOSTERONE                       Coagulation Parameters Lab Results  Component Value Date   PLT 311 03/09/2018  Cardiovascular Markers Lab Results  Component Value Date   HGB 12.9 03/09/2018   HCT 39.3 03/09/2018                         ID Markers No results found for: LYMEIGGIGMAB, HIV                      CA Markers No results found for: CEA, CA125, LABCA2                      Endocrine Markers No results found for: TSH, FREET4, TESTOFREE, TESTOSTERONE, SHBG, ESTRADIOL, ESTRADIOLPCT, ESTRADIOLFRE, LABPREG, ACTH                      Note: Lab results reviewed.  PFSH  Drug: Kristin Porter  has no history on file for drug. Alcohol:  has no history on file for alcohol. Tobacco:  has no history on file for tobacco. Medical:  has no past medical history on file. Family: family history is not on file.  *** The histories are not reviewed yet. Please review them in the "History" navigator section and refresh this [place]. Active Ambulatory Problems    Diagnosis Date Noted  . Chronic pain of both hips 04/16/2018  . Depression 10/30/2011  . Fibromyalgia 12/01/2010  . GERD (gastroesophageal reflux disease) 06/19/2012  . History of total knee arthroplasty, left 08/16/2014  . Low back pain 02/18/2012  . Lumbosacral spondylosis without myelopathy 09/05/2016  . Multiple joint pain 12/09/2013  . OA (osteoarthritis) of knee 07/30/2012  . Syrinx of spinal cord ([place]) 05/10/2017  . Thoracic myelopathy 12/26/2017   Resolved Ambulatory Problems    Diagnosis Date Noted  . No Resolved Ambulatory Problems   No Additional Past Medical History   Constitutional Exam   General appearance: Well nourished, well developed, and well hydrated. In no apparent acute distress There were no vitals filed for this visit. BMI Assessment: Estimated body mass index is 40.4 kg/m as calculated from the following:   Height as of 03/09/18: _0  (1.499 m).   Weight as of 03/09/18: 200 lb (90.7 kg).  BMI interpretation table: BMI level Category Range association with higher incidence of chronic pain  <18 kg/m2 Underweight   18.5-24.9 kg/m2 Ideal body weight   25-29.9 kg/m2 Overweight Increased incidence by 20%  30-34.9 kg/m2 Obese (Class I) Increased incidence by 68%  35-39.9 kg/m2 Severe obesity (Class II) Increased incidence by 136%  >40 kg/m2 Extreme obesity (Class III) Increased incidence by 254%   Patient's current BMI Ideal Body weight  There is no height or weight on file to calculate BMI. Patient weight not recorded   BMI Readings from Last 4 Encounters:  03/09/18 40.40 kg/m   Wt Readings from Last 4 Encounters:  03/09/18 200 lb (90.7 kg)  Psych/Mental status: Alert, oriented x 3 (person, place, & time)       Eyes: PERLA Respiratory: No evidence of acute respiratory distress  Cervical Spine Area Exam  Skin & Axial Inspection: No masses, redness, edema, swelling, or associated skin lesions Alignment: Symmetrical Functional ROM: Unrestricted ROM      Stability: No instability detected Muscle Tone/Strength: Functionally intact. No obvious neuro-muscular anomalies detected. Sensory (Neurological): Unimpaired Palpation: No palpable anomalies              Upper Extremity (UE) Exam    Side: Right upper extremity  Side: Left upper extremity  Skin &  Extremity Inspection: Skin color, temperature, and hair growth are WNL. No peripheral edema or cyanosis. No masses, redness, swelling, asymmetry, or associated skin lesions. No contractures.  Skin & Extremity Inspection: Skin color, temperature, and hair growth are WNL. No peripheral edema or cyanosis. No masses,  redness, swelling, asymmetry, or associated skin lesions. No contractures.  Functional ROM: Unrestricted ROM          Functional ROM: Unrestricted ROM          Muscle Tone/Strength: Functionally intact. No obvious neuro-muscular anomalies detected.  Muscle Tone/Strength: Functionally intact. No obvious neuro-muscular anomalies detected.  Sensory (Neurological): Unimpaired          Sensory (Neurological): Unimpaired          Palpation: No palpable anomalies              Palpation: No palpable anomalies              Provocative Test(s):  Phalen's test: deferred Tinel's test: deferred Apley's scratch test (touch opposite shoulder):  Action 1 (Across chest): deferred Action 2 (Overhead): deferred Action 3 (LB reach): deferred   Provocative Test(s):  Phalen's test: deferred Tinel's test: deferred Apley's scratch test (touch opposite shoulder):  Action 1 (Across chest): deferred Action 2 (Overhead): deferred Action 3 (LB reach): deferred    Thoracic Spine Area Exam  Skin & Axial Inspection: No masses, redness, or swelling Alignment: Symmetrical Functional ROM: Unrestricted ROM Stability: No instability detected Muscle Tone/Strength: Functionally intact. No obvious neuro-muscular anomalies detected. Sensory (Neurological): Unimpaired Muscle strength & Tone: No palpable anomalies  Lumbar Spine Area Exam  Skin & Axial Inspection: No masses, redness, or swelling Alignment: Symmetrical Functional ROM: Unrestricted ROM       Stability: No instability detected Muscle Tone/Strength: Functionally intact. No obvious neuro-muscular anomalies detected. Sensory (Neurological): Unimpaired Palpation: No palpable anomalies       Provocative Tests: Hyperextension/rotation test: deferred today       Lumbar quadrant test (Kemp's test): deferred today       Lateral bending test: deferred today       Patrick's Maneuver: deferred today                   FABER* test: deferred today                    S-I anterior distraction/compression test: deferred today         S-I lateral compression test: deferred today         S-I Thigh-thrust test: deferred today         S-I Gaenslen's test: deferred today         *(Flexion, ABduction and External Rotation)  Gait & Posture Assessment  Ambulation: Unassisted Gait: Relatively normal for age and body habitus Posture: WNL   Lower Extremity Exam    Side: Right lower extremity  Side: Left lower extremity  Stability: No instability observed          Stability: No instability observed          Skin & Extremity Inspection: Skin color, temperature, and hair growth are WNL. No peripheral edema or cyanosis. No masses, redness, swelling, asymmetry, or associated skin lesions. No contractures.  Skin & Extremity Inspection: Skin color, temperature, and hair growth are WNL. No peripheral edema or cyanosis. No masses, redness, swelling, asymmetry, or associated skin lesions. No contractures.  Functional ROM: Unrestricted ROM  Functional ROM: Unrestricted ROM                  Muscle Tone/Strength: Functionally intact. No obvious neuro-muscular anomalies detected.  Muscle Tone/Strength: Functionally intact. No obvious neuro-muscular anomalies detected.  Sensory (Neurological): Unimpaired        Sensory (Neurological): Unimpaired        DTR: Patellar: deferred today Achilles: deferred today Plantar: deferred today  DTR: Patellar: deferred today Achilles: deferred today Plantar: deferred today  Palpation: No palpable anomalies  Palpation: No palpable anomalies   Assessment  Primary Diagnosis & Pertinent Problem List: There were no encounter diagnoses.  Visit Diagnosis (New problems to examiner): No diagnosis found. Plan of Care (Initial workup plan)  Note: Please be advised that as per protocol, today's visit has been an evaluation only. We have not taken over the patient's controlled substance management.  Problem-specific plan: No  problem-specific Assessment & Plan notes found for this encounter.  Ordered Lab-work, Procedure(s), Referral(s), & Consult(s): No orders of the defined types were placed in this encounter.  Pharmacotherapy (current): Medications ordered:  No orders of the defined types were placed in this encounter.  Medications administered during this visit: Tarshia O. Porter had no medications administered during this visit.   Pharmacological management options:  Opioid Analgesics: The patient was informed that there is no guarantee that she would be a candidate for opioid analgesics. The decision will be made following CDC guidelines. This decision will be based on the results of diagnostic studies, as well as Kristin Porter's risk profile.   Membrane stabilizer: To be determined at a later time  Muscle relaxant: To be determined at a later time  NSAID: To be determined at a later time  Other analgesic(s): To be determined at a later time   Interventional management options: Kristin Porter was informed that there is no guarantee that she would be a candidate for interventional therapies. The decision will be based on the results of diagnostic studies, as well as Kristin Porter's risk profile.  Procedure(s) under consideration:  ***   Provider-requested follow-up: No follow-ups on file.  No future appointments.  Primary Care Physician: Sallee Lange, NP Location: [facility] Note by: Gillis Santa, M.D, Date: 09/08/2018; Time: 1:05 PM  There are no Patient Instructions on file for this visit.

## 2018-10-01 ENCOUNTER — Encounter: Payer: Self-pay | Admitting: Student in an Organized Health Care Education/Training Program

## 2018-10-02 ENCOUNTER — Ambulatory Visit
Payer: Medicare Other | Attending: Student in an Organized Health Care Education/Training Program | Admitting: Student in an Organized Health Care Education/Training Program

## 2018-10-02 ENCOUNTER — Other Ambulatory Visit: Payer: Self-pay

## 2018-10-02 DIAGNOSIS — F339 Major depressive disorder, recurrent, unspecified: Secondary | ICD-10-CM | POA: Diagnosis not present

## 2018-10-02 DIAGNOSIS — M4714 Other spondylosis with myelopathy, thoracic region: Secondary | ICD-10-CM

## 2018-10-02 DIAGNOSIS — M25552 Pain in left hip: Secondary | ICD-10-CM

## 2018-10-02 DIAGNOSIS — M47817 Spondylosis without myelopathy or radiculopathy, lumbosacral region: Secondary | ICD-10-CM

## 2018-10-02 DIAGNOSIS — M797 Fibromyalgia: Secondary | ICD-10-CM | POA: Diagnosis not present

## 2018-10-02 DIAGNOSIS — M170 Bilateral primary osteoarthritis of knee: Secondary | ICD-10-CM

## 2018-10-02 DIAGNOSIS — G8929 Other chronic pain: Secondary | ICD-10-CM

## 2018-10-02 DIAGNOSIS — M25551 Pain in right hip: Secondary | ICD-10-CM

## 2018-10-02 DIAGNOSIS — M545 Low back pain, unspecified: Secondary | ICD-10-CM

## 2018-10-02 DIAGNOSIS — Z96652 Presence of left artificial knee joint: Secondary | ICD-10-CM | POA: Diagnosis not present

## 2018-10-02 MED ORDER — BACLOFEN 10 MG PO TABS: 10.0000 mg | ORAL_TABLET | Freq: Three times a day (TID) | ORAL | 2 refills | Status: DC

## 2018-10-02 MED ORDER — PREGABALIN 75 MG PO CAPS: 75.0000 mg | ORAL_CAPSULE | Freq: Three times a day (TID) | ORAL | 2 refills | Status: DC

## 2018-10-02 NOTE — Progress Notes (Signed)
Pain Management Virtual Encounter Note - Virtual Visit via Telephone Telehealth (real-time audio visits between healthcare provider and patient).  Patient's Phone No. & Preferred Pharmacy:  [phone] (home); There is no such number on file (mobile).; (Preferred) [phone] No e-mail address on record  [place] (N), [place] ([place]) [place] [address] Phone: [phone] Fax: [phone]   Pre-screening note:  Our staff contacted Kristin Porter and offered her an "in person", "face-to-face" appointment versus a telephone encounter. She indicated preferring the telephone encounter, at this time.  Reason for Virtual Visit: COVID-19*  Social distancing based on CDC and AMA recommendations.   I contacted [place] on 10/02/2018 at 11:21 AM via telephone.      I clearly identified myself as Gillis Santa, MD. I verified that I was speaking with the correct person using two identifiers (Name and date of birth: 02-Feb-1961).  Advanced Informed Consent I sought verbal advanced consent from Kristin Porter for virtual visit interactions. I informed Kristin Porter of possible security and privacy concerns, risks, and limitations associated with providing "not-in-person" medical evaluation and management services. I also informed Kristin Porter of the availability of "in-person" appointments. Finally, I informed her that there would be a charge for the virtual visit and that she could be  personally, fully or partially, financially responsible for it. Kristin Porter expressed understanding and agreed to proceed.   Historic Elements   Kristin Porter is a 58 y.o. year old, female patient evaluated today after her last encounter by our practice on 09/08/2018. Kristin Porter  has no past medical history on file. She also  has no past surgical history on file. Kristin Porter has a current medication list which includes the  following prescription(s): albuterol, amlodipine, baclofen, fluticasone, paroxetine, pregabalin, and trazodone. She  reports that she has quit smoking. She has never used smokeless tobacco. She reports previous alcohol use. She reports that she does not use drugs. Kristin Porter is allergic to tizanidine; lisinopril; and tramadol.   HPI  I last communicated with her on 09/08/2018. Today, she is being contacted for medication management.  At her last visit, patient was started on Lyrica 75 mg 3 times daily and her Flexeril was discontinued and she was started on baclofen 10 mg 3 times daily.  She states that the Lyrica and baclofen are helping out with her fibromyalgia symptoms and providing her with better analgesic benefit than her previous regimen.  No side effects associated with Lyrica such as lower extremity swelling, sedation, nausea or vomiting.  I would like to keep the patient's Lyrica dose as it is as well as her baclofen dose.  We will refill as below.   Review of recent tests  DG HIP UNILAT WITH PELVIS 2-3 VIEWS LEFT CLINICAL DATA:  Bilateral hip pain.  EXAM: DG HIP (WITH OR WITHOUT PELVIS) 2-3V LEFT  COMPARISON:  None.  FINDINGS: There is no evidence of hip fracture or dislocation. There is no evidence of arthropathy or other focal bone abnormality.  IMPRESSION: Negative.  Electronically Signed   By: Marijo Conception, M.D.   On: 03/09/2018 20:44 DG Lumbar Spine 2-3 Views CLINICAL DATA:  Low back pain.  EXAM: LUMBAR SPINE - 2-3 VIEW  COMPARISON:  None.  FINDINGS: There is no evidence of lumbar spine fracture. Alignment is normal. Intervertebral disc spaces are maintained.  IMPRESSION: Negative.  Electronically Signed   By: Marijo Conception, M.D.  On: 03/09/2018 20:44 DG HIP UNILAT WITH PELVIS 2-3 VIEWS RIGHT CLINICAL DATA:  Bilateral hip pain.  EXAM: DG HIP (WITH OR WITHOUT PELVIS) 2-3V RIGHT  COMPARISON:  None.  FINDINGS: There is no evidence of hip  fracture or dislocation. There is no evidence of arthropathy or other focal bone abnormality.  IMPRESSION: Negative.  Electronically Signed   By: Marijo Conception, M.D.   On: 03/09/2018 20:42   Admission on 03/09/2018, Discharged on 03/09/2018  Component Date Value Ref Range Status  . Color, Urine 03/09/2018 YELLOW  YELLOW Final  . APPearance 03/09/2018 CLEAR  CLEAR Final  . Specific Gravity, Urine 03/09/2018 1.020  1.005 - 1.030 Final  . pH 03/09/2018 7.5  5.0 - 8.0 Final  . Glucose, UA 03/09/2018 NEGATIVE  NEGATIVE mg/dL Final  . Hgb urine dipstick 03/09/2018 NEGATIVE  NEGATIVE Final  . Bilirubin Urine 03/09/2018 NEGATIVE  NEGATIVE Final  . Ketones, ur 03/09/2018 NEGATIVE  NEGATIVE mg/dL Final  . Protein, ur 03/09/2018 NEGATIVE  NEGATIVE mg/dL Final  . Nitrite 03/09/2018 NEGATIVE  NEGATIVE Final  . Leukocytes, UA 03/09/2018 NEGATIVE  NEGATIVE Final  . Squamous Epithelial / LPF 03/09/2018 0-5  0 - 5 Final  . WBC, UA 03/09/2018 NONE SEEN  0 - 5 WBC/hpf Final  . RBC / HPF 03/09/2018 0-5  0 - 5 RBC/hpf Final  . Bacteria, UA 03/09/2018 RARE* NONE SEEN Final  . Mucus 03/09/2018 PRESENT   Final   Performed at [facility], [address], [place], [place] [address]  . Preg Test, Ur 03/09/2018 NEGATIVE  NEGATIVE Final   Comment:        THE SENSITIVITY OF THIS METHODOLOGY IS >24 mIU/mL   . WBC 03/09/2018 5.6  4.0 - 10.5 K/uL Final  . RBC 03/09/2018 4.40  3.87 - 5.11 MIL/uL Final  . Hemoglobin 03/09/2018 12.9  12.0 - 15.0 g/dL Final  . HCT 03/09/2018 39.3  36.0 - 46.0 % Final  . MCV 03/09/2018 89.3  80.0 - 100.0 fL Final  . MCH 03/09/2018 29.3  26.0 - 34.0 pg Final  . MCHC 03/09/2018 32.8  30.0 - 36.0 g/dL Final  . RDW 03/09/2018 13.1  11.5 - 15.5 % Final  . Platelets 03/09/2018 311  150 - 400 K/uL Final  . nRBC 03/09/2018 0.0  0.0 - 0.2 % Final   Performed at [facility], [address] [place], [place] [address]  . Sodium 03/09/2018 139  135 - 145 mmol/L  Final  . Potassium 03/09/2018 4.9  3.5 - 5.1 mmol/L Final  . Chloride 03/09/2018 105  98 - 111 mmol/L Final  . CO2 03/09/2018 26  22 - 32 mmol/L Final  . Glucose, Bld 03/09/2018 99  70 - 99 mg/dL Final  . BUN 03/09/2018 8  6 - 20 mg/dL Final  . Creatinine, Ser 03/09/2018 0.69  0.44 - 1.00 mg/dL Final  . Calcium 03/09/2018 8.8* 8.9 - 10.3 mg/dL Final  . GFR calc non Af Amer 03/09/2018 >60  >60 mL/min Final  . GFR calc Af Amer 03/09/2018 >60  >60 mL/min Final   Comment: (NOTE) The eGFR has been calculated using the CKD EPI equation. This calculation has not been validated in all clinical situations. eGFR's persistently <60 mL/min signify possible Chronic Kidney Disease.   Georgiann Hahn gap 03/09/2018 8  5 - 15 Final   Performed at [facility], [address] [place], [place] [address]   Assessment  The primary encounter diagnosis was Thoracic  myelopathy (due to syrinx, s/p excision 12/23/2017). Diagnoses of Fibromyalgia, Episode of recurrent major depressive disorder, unspecified depression episode severity ([place]), Chronic pain of both hips, History of total knee arthroplasty, left, Chronic bilateral low back pain without sciatica, Lumbosacral spondylosis without myelopathy, and Primary osteoarthritis of both knees were also pertinent to this visit.  Plan of Care  I have discontinued Kristin Porter's cyclobenzaprine. I have also changed her baclofen. Additionally, I am having her maintain her albuterol, PARoxetine, amLODipine, fluticasone, traZODone, and pregabalin.  Pharmacotherapy (Medications Ordered): Meds ordered this encounter  Medications  . pregabalin (LYRICA) 75 MG capsule    Sig: Take 1 capsule (75 mg total) by mouth 3 (three) times daily.    Dispense:  90 capsule    Refill:  2  . baclofen (LIORESAL) 10 MG tablet    Sig: Take 1 tablet (10 mg total) by mouth 3 (three) times daily.    Dispense:  30 each    Refill:  2   Follow-up plan:   Return in about 10 weeks  (around 12/11/2018) for Medication Management.   I discussed the assessment and treatment plan with the patient. The patient was provided an opportunity to ask questions and all were answered. The patient agreed with the plan and demonstrated an understanding of the instructions.  Patient advised to call back or seek an in-person evaluation if the symptoms or condition worsens.  Total duration of non-face-to-face encounter: 25 minutes.  Note by: Gillis Santa, MD Date: 10/02/2018; Time: 11:21 AM  Disclaimer:  * Given the special circumstances of the COVID-19 pandemic, the federal government has announced that the Office for Civil Rights (OCR) will exercise its enforcement discretion and will not impose penalties on physicians using telehealth in the event of noncompliance with regulatory requirements under the [place] (HIPAA) in connection with the good faith provision of telehealth during the YEMVV-61 national public health emergency. ([place])

## 2018-10-22 DIAGNOSIS — H25013 Cortical age-related cataract, bilateral: Secondary | ICD-10-CM | POA: Diagnosis not present

## 2018-10-22 DIAGNOSIS — H43393 Other vitreous opacities, bilateral: Secondary | ICD-10-CM | POA: Diagnosis not present

## 2018-12-02 DIAGNOSIS — R05 Cough: Secondary | ICD-10-CM | POA: Diagnosis not present

## 2018-12-11 ENCOUNTER — Encounter: Payer: Self-pay | Admitting: Student in an Organized Health Care Education/Training Program

## 2018-12-11 ENCOUNTER — Ambulatory Visit
Payer: Medicare Other | Attending: Student in an Organized Health Care Education/Training Program | Admitting: Student in an Organized Health Care Education/Training Program

## 2018-12-11 ENCOUNTER — Other Ambulatory Visit: Payer: Self-pay

## 2018-12-11 DIAGNOSIS — M797 Fibromyalgia: Secondary | ICD-10-CM

## 2018-12-11 DIAGNOSIS — M545 Low back pain, unspecified: Secondary | ICD-10-CM

## 2018-12-11 DIAGNOSIS — M4714 Other spondylosis with myelopathy, thoracic region: Secondary | ICD-10-CM

## 2018-12-11 DIAGNOSIS — M47817 Spondylosis without myelopathy or radiculopathy, lumbosacral region: Secondary | ICD-10-CM

## 2018-12-11 DIAGNOSIS — M25551 Pain in right hip: Secondary | ICD-10-CM

## 2018-12-11 DIAGNOSIS — Z96652 Presence of left artificial knee joint: Secondary | ICD-10-CM | POA: Diagnosis not present

## 2018-12-11 DIAGNOSIS — G8929 Other chronic pain: Secondary | ICD-10-CM

## 2018-12-11 DIAGNOSIS — M170 Bilateral primary osteoarthritis of knee: Secondary | ICD-10-CM

## 2018-12-11 DIAGNOSIS — M25552 Pain in left hip: Secondary | ICD-10-CM

## 2018-12-11 MED ORDER — PREGABALIN 75 MG PO CAPS: ORAL_CAPSULE | ORAL | 5 refills | Status: DC

## 2018-12-11 MED ORDER — BACLOFEN 10 MG PO TABS: 10.0000 mg | ORAL_TABLET | Freq: Three times a day (TID) | ORAL | 2 refills | Status: DC | PRN

## 2018-12-11 NOTE — Progress Notes (Signed)
Pain Management Virtual Encounter Note - Virtual Visit via Telephone Telehealth (real-time audio visits between healthcare provider and patient).   Patient's Phone No. & Preferred Pharmacy:  [phone] (home); There is no such number on file (mobile).; (Preferred) [phone] No e-mail address on record  [place] (N), [place] ([place]) [place] [address] Phone: [phone] Fax: [phone]    Pre-screening note:  Our staff contacted Kristin Porter and offered her an "in person", "face-to-face" appointment versus a telephone encounter. She indicated preferring the telephone encounter, at this time.   Reason for Virtual Visit: COVID-19*  Social distancing based on CDC and AMA recommendations.   I contacted [place] on 12/11/2018 via telephone.      I clearly identified myself as Gillis Santa, MD. I verified that I was speaking with the correct person using two identifiers (Name: Kristin Porter, and date of birth: 1960-08-24).  Advanced Informed Consent I sought verbal advanced consent from Kristin Porter for virtual visit interactions. I informed Kristin Porter of possible security and privacy concerns, risks, and limitations associated with providing "not-in-person" medical evaluation and management services. I also informed Kristin Porter of the availability of "in-person" appointments. Finally, I informed her that there would be a charge for the virtual visit and that she could be  personally, fully or partially, financially responsible for it. Kristin Porter expressed understanding and agreed to proceed.   Historic Elements   Kristin Porter is a 58 y.o. year old, female patient evaluated today after her last encounter by our practice on 10/02/2018. Kristin Porter  has no past medical history on file. She also  has no past surgical history on file. Kristin Porter has a current medication list which  includes the following prescription(s): albuterol, amlodipine, baclofen, fluticasone, paroxetine, pregabalin, and trazodone. She  reports that she has quit smoking. She has never used smokeless tobacco. She reports previous alcohol use. She reports that she does not use drugs. Kristin Porter is allergic to tizanidine; lisinopril; and tramadol.   HPI  Today, she is being contacted for medication management.   Patient was unable to increase her Lyrica to 75 mg 3 times daily.  She states that it does make her dizzy and she does have trouble with balance.  She is taking it 75 mg twice daily.  We discussed increasing her nighttime dose to 150 mg nightly so that she is taking 75 mg during the day and 150 mg nightly.  Patient has sedation, irritability, grogginess in the morning, I instructed her to go back to 75 mg twice daily.  Also refill her baclofen as below.   Pertinent Labs   SAFETY SCREENING Profile Lab Results  Component Value Date   PREGTESTUR NEGATIVE 03/09/2018   Renal Function Lab Results  Component Value Date   BUN 8 03/09/2018   CREATININE 0.69 03/09/2018   GFRAA >60 03/09/2018   GFRNONAA >60 03/09/2018   Hepatic Function No results found for: AST, ALT, ALBUMIN UDS No results found for: SUMMARY Note: Above Lab results reviewed.  Recent imaging  DG HIP UNILAT WITH PELVIS 2-3 VIEWS LEFT CLINICAL DATA:  Bilateral hip pain.  EXAM: DG HIP (WITH OR WITHOUT PELVIS) 2-3V LEFT  COMPARISON:  None.  FINDINGS: There is no evidence of hip fracture or dislocation. There is no evidence of arthropathy or other focal bone abnormality.  IMPRESSION: Negative.  Electronically Signed   By: Marijo Conception, M.D.  On: 03/09/2018 20:44 DG Lumbar Spine 2-3 Views CLINICAL DATA:  Low back pain.  EXAM: LUMBAR SPINE - 2-3 VIEW  COMPARISON:  None.  FINDINGS: There is no evidence of lumbar spine fracture. Alignment is normal. Intervertebral disc spaces are  maintained.  IMPRESSION: Negative.  Electronically Signed   By: Marijo Conception, M.D.   On: 03/09/2018 20:44 DG HIP UNILAT WITH PELVIS 2-3 VIEWS RIGHT CLINICAL DATA:  Bilateral hip pain.  EXAM: DG HIP (WITH OR WITHOUT PELVIS) 2-3V RIGHT  COMPARISON:  None.  FINDINGS: There is no evidence of hip fracture or dislocation. There is no evidence of arthropathy or other focal bone abnormality.  IMPRESSION: Negative.  Electronically Signed   By: Marijo Conception, M.D.   On: 03/09/2018 20:42  Assessment  The primary encounter diagnosis was Thoracic myelopathy (due to syrinx, s/p excision 12/23/2017). Diagnoses of Fibromyalgia, Chronic pain of both hips, History of total knee arthroplasty, left, Chronic bilateral low back pain without sciatica, Lumbosacral spondylosis without myelopathy, and Primary osteoarthritis of both knees were also pertinent to this visit.  Plan of Care  I have changed Kristin Porter's pregabalin and baclofen. I am also having her maintain her albuterol, PARoxetine, amLODipine, fluticasone, and traZODone.  Pharmacotherapy (Medications Ordered): Meds ordered this encounter  Medications  . pregabalin (LYRICA) 75 MG capsule    Sig: 75 mg daily, 150 mg qhs    Dispense:  90 capsule    Refill:  5  . baclofen (LIORESAL) 10 MG tablet    Sig: Take 1 tablet (10 mg total) by mouth 3 (three) times daily as needed for muscle spasms.    Dispense:  90 each    Refill:  2   Orders:  No orders of the defined types were placed in this encounter.  Follow-up plan:   Return in about 3 months (around 03/13/2019) for Medication Management.    Recent Visits Date Type Provider Dept  10/02/18 Office Visit Gillis Santa, MD [facility]  Showing recent visits within past 90 days and meeting all other requirements   Today's Visits Date Type Provider Dept  12/11/18 Office Visit Gillis Santa, MD [facility]  Showing today's visits and meeting all other  requirements   Future Appointments No visits were found meeting these conditions.  Showing future appointments within next 90 days and meeting all other requirements   I discussed the assessment and treatment plan with the patient. The patient was provided an opportunity to ask questions and all were answered. The patient agreed with the plan and demonstrated an understanding of the instructions.  Patient advised to call back or seek an in-person evaluation if the symptoms or condition worsens.  Total duration of non-face-to-face encounter: 15 minutes.  Note by: Gillis Santa, MD Date: 12/11/2018; Time: 1:00 PM  Note: This dictation was prepared with Dragon dictation. Any transcriptional errors that may result from this process are unintentional.  Disclaimer:  * Given the special circumstances of the COVID-19 pandemic, the federal government has announced that the Office for Civil Rights (OCR) will exercise its enforcement discretion and will not impose penalties on physicians using telehealth in the event of noncompliance with regulatory requirements under the [place] and [place] (HIPAA) in connection with the good faith provision of telehealth during the TMLYY-50 national public health emergency. ([place])

## 2018-12-18 DIAGNOSIS — K219 Gastro-esophageal reflux disease without esophagitis: Secondary | ICD-10-CM | POA: Diagnosis not present

## 2018-12-18 DIAGNOSIS — Z760 Encounter for issue of repeat prescription: Secondary | ICD-10-CM | POA: Diagnosis not present

## 2018-12-18 DIAGNOSIS — M25552 Pain in left hip: Secondary | ICD-10-CM | POA: Diagnosis not present

## 2018-12-18 DIAGNOSIS — Z1211 Encounter for screening for malignant neoplasm of colon: Secondary | ICD-10-CM | POA: Diagnosis not present

## 2018-12-18 DIAGNOSIS — R42 Dizziness and giddiness: Secondary | ICD-10-CM | POA: Diagnosis not present

## 2018-12-18 DIAGNOSIS — E785 Hyperlipidemia, unspecified: Secondary | ICD-10-CM | POA: Diagnosis not present

## 2018-12-18 DIAGNOSIS — Z13220 Encounter for screening for lipoid disorders: Secondary | ICD-10-CM | POA: Diagnosis not present

## 2018-12-18 DIAGNOSIS — N950 Postmenopausal bleeding: Secondary | ICD-10-CM | POA: Diagnosis not present

## 2018-12-18 DIAGNOSIS — R7303 Prediabetes: Secondary | ICD-10-CM | POA: Diagnosis not present

## 2018-12-18 DIAGNOSIS — R0789 Other chest pain: Secondary | ICD-10-CM | POA: Diagnosis not present

## 2018-12-18 DIAGNOSIS — M25551 Pain in right hip: Secondary | ICD-10-CM | POA: Diagnosis not present

## 2018-12-18 DIAGNOSIS — I10 Essential (primary) hypertension: Secondary | ICD-10-CM | POA: Diagnosis not present

## 2018-12-18 DIAGNOSIS — R232 Flushing: Secondary | ICD-10-CM | POA: Diagnosis not present

## 2018-12-18 DIAGNOSIS — R921 Mammographic calcification found on diagnostic imaging of breast: Secondary | ICD-10-CM | POA: Diagnosis not present

## 2018-12-18 DIAGNOSIS — G4700 Insomnia, unspecified: Secondary | ICD-10-CM | POA: Diagnosis not present

## 2018-12-19 DIAGNOSIS — R0789 Other chest pain: Secondary | ICD-10-CM | POA: Diagnosis not present

## 2018-12-19 DIAGNOSIS — R7989 Other specified abnormal findings of blood chemistry: Secondary | ICD-10-CM | POA: Diagnosis not present

## 2018-12-19 DIAGNOSIS — K449 Diaphragmatic hernia without obstruction or gangrene: Secondary | ICD-10-CM | POA: Diagnosis not present

## 2018-12-19 DIAGNOSIS — J9811 Atelectasis: Secondary | ICD-10-CM | POA: Diagnosis not present

## 2018-12-19 DIAGNOSIS — I2510 Atherosclerotic heart disease of native coronary artery without angina pectoris: Secondary | ICD-10-CM | POA: Diagnosis not present

## 2018-12-22 ENCOUNTER — Telehealth: Payer: Self-pay | Admitting: Student in an Organized Health Care Education/Training Program

## 2018-12-22 NOTE — Telephone Encounter (Signed)
Kristin Porter can you let patient know or her pcp know its ok to prescribe medication.

## 2018-12-22 NOTE — Telephone Encounter (Signed)
Patient wants to know if it is ok for her PCP to write script for tramadol until she gets back in to see you in October. She is in a lot of pain.

## 2018-12-23 ENCOUNTER — Emergency Department
Admission: EM | Admit: 2018-12-23 | Discharge: 2018-12-23 | Disposition: A | Discharge status: Home / Self Care | Payer: Medicare Other | Attending: Emergency Medicine | Admitting: Emergency Medicine

## 2018-12-23 ENCOUNTER — Other Ambulatory Visit: Payer: Self-pay

## 2018-12-23 ENCOUNTER — Emergency Department: Payer: Medicare Other

## 2018-12-23 ENCOUNTER — Encounter: Payer: Self-pay | Admitting: *Deleted

## 2018-12-23 DIAGNOSIS — Z79899 Other long term (current) drug therapy: Secondary | ICD-10-CM | POA: Diagnosis not present

## 2018-12-23 DIAGNOSIS — R0789 Other chest pain: Secondary | ICD-10-CM | POA: Diagnosis not present

## 2018-12-23 DIAGNOSIS — R079 Chest pain, unspecified: Secondary | ICD-10-CM | POA: Diagnosis not present

## 2018-12-23 DIAGNOSIS — Z96652 Presence of left artificial knee joint: Secondary | ICD-10-CM | POA: Insufficient documentation

## 2018-12-23 DIAGNOSIS — J40 Bronchitis, not specified as acute or chronic: Secondary | ICD-10-CM | POA: Insufficient documentation

## 2018-12-23 DIAGNOSIS — Z87891 Personal history of nicotine dependence: Secondary | ICD-10-CM | POA: Diagnosis not present

## 2018-12-23 LAB — CBC WITH DIFFERENTIAL/PLATELET
Abs Immature Granulocytes: 0.03 10*3/uL (ref 0.00–0.07)
Basophils Absolute: 0 10*3/uL (ref 0.0–0.1)
Basophils Relative: 0 %
Eosinophils Absolute: 0.1 10*3/uL (ref 0.0–0.5)
Eosinophils Relative: 1 %
HCT: 39.4 % (ref 36.0–46.0)
Hemoglobin: 13.1 g/dL (ref 12.0–15.0)
Immature Granulocytes: 0 %
Lymphocytes Relative: 25 %
Lymphs Abs: 1.9 10*3/uL (ref 0.7–4.0)
MCH: 29.2 pg (ref 26.0–34.0)
MCHC: 33.2 g/dL (ref 30.0–36.0)
MCV: 87.9 fL (ref 80.0–100.0)
Monocytes Absolute: 0.8 10*3/uL (ref 0.1–1.0)
Monocytes Relative: 10 %
Neutro Abs: 4.8 10*3/uL (ref 1.7–7.7)
Neutrophils Relative %: 64 %
Platelets: 278 10*3/uL (ref 150–400)
RBC: 4.48 MIL/uL (ref 3.87–5.11)
RDW: 13.8 % (ref 11.5–15.5)
WBC: 7.6 10*3/uL (ref 4.0–10.5)
nRBC: 0 % (ref 0.0–0.2)

## 2018-12-23 LAB — BASIC METABOLIC PANEL
Anion gap: 9 (ref 5–15)
BUN: 19 mg/dL (ref 6–20)
CO2: 25 mmol/L (ref 22–32)
Calcium: 9.2 mg/dL (ref 8.9–10.3)
Chloride: 106 mmol/L (ref 98–111)
Creatinine, Ser: 0.69 mg/dL (ref 0.44–1.00)
GFR calc Af Amer: >60 mL/min (ref >60)
GFR calc non Af Amer: >60 mL/min (ref >60)
Glucose, Bld: 125 mg/dL — ABNORMAL HIGH (ref 70–99)
Potassium: 4.4 mmol/L (ref 3.5–5.1)
Sodium: 140 mmol/L (ref 135–145)

## 2018-12-23 LAB — TROPONIN I (HIGH SENSITIVITY)
Troponin I (High Sensitivity): 4 ng/L (ref <18)
Troponin I (High Sensitivity): 4 ng/L (ref <18)

## 2018-12-23 MED ORDER — LIDOCAINE 5 % EX PTCH: 1.0000 | MEDICATED_PATCH | Freq: Two times a day (BID) | CUTANEOUS | 0 refills | Status: DC

## 2018-12-23 NOTE — ED Provider Notes (Signed)
[facility] Emergency Department Provider Note   ____________________________________________   First MD Initiated Contact with Patient 12/23/18 1116     (approximate)  I have reviewed the triage vital signs and the nursing notes.   HISTORY  Chief Complaint Chest Pain    HPI Kristin Porter is a 58 y.o. female  58 year old female with past medical history of fibromyalgia presents to the ED complaining of chest pain.  Patient reports she has had constant pain in the center of her chest for about the past 3 days.  Seems to be worse when she takes a deep breath or coughs, but is not associated with exertion.  She describes the pain as sharp or achy.  She denies any associated fevers, chills, shortness of breath, or leg swelling/pain.  She was seen by her PCP for this problem approximately 4 days ago, had outpatient CTA performed that was negative for PE.  She denies any cardiac history and is a non-smoker.        History reviewed. No pertinent past medical history.  Patient Active Problem List   Diagnosis Date Noted  . Chronic pain of both hips 04/16/2018  . Thoracic myelopathy 12/26/2017  . Syrinx of spinal cord ([place]) 05/10/2017  . Lumbosacral spondylosis without myelopathy 09/05/2016  . History of total knee arthroplasty, left 08/16/2014  . Multiple joint pain 12/09/2013  . OA (osteoarthritis) of knee 07/30/2012  . GERD (gastroesophageal reflux disease) 06/19/2012  . Low back pain 02/18/2012  . Depression 10/30/2011  . Fibromyalgia 12/01/2010    History reviewed. No pertinent surgical history.  Prior to Admission medications   Medication Sig Start Date End Date Taking? Authorizing Provider  albuterol (PROVENTIL) (2.5 MG/3ML) 0.083% nebulizer solution Take 2.5 mg by nebulization every 6 (six) hours as needed for wheezing or shortness of breath.    [provider]  amLODipine (NORVASC) 5 MG tablet Take 5 mg by mouth daily.     [provider]  baclofen (LIORESAL) 10 MG tablet Take 1 tablet (10 mg total) by mouth 3 (three) times daily as needed for muscle spasms. 12/11/18   Gillis Santa, MD  fluticasone (FLONASE) 50 MCG/ACT nasal spray Place 1 spray into both nostrils 2 (two) times daily.    [provider]  lidocaine (LIDODERM) 5 % Place 1 patch onto the skin every 12 (twelve) hours. Remove & Discard patch within 12 hours or as directed by MD 12/23/18 12/23/19  Blake Divine, MD  PARoxetine (PAXIL) 10 MG tablet Take 10 mg by mouth daily.    [provider]  pregabalin (LYRICA) 75 MG capsule 75 mg daily, 150 mg qhs 12/11/18   Gillis Santa, MD  traZODone (DESYREL) 50 MG tablet Take 50 mg by mouth at bedtime.    [provider]    Allergies Tizanidine, Lisinopril, and Tramadol  History reviewed. No pertinent family history.  Social History Social History   Tobacco Use  . Smoking status: Former Research scientist (life sciences)  . Smokeless tobacco: Never Used  Substance Use Topics  . Alcohol use: Not Currently  . Drug use: Never    Review of Systems  Constitutional: No fever/chills Eyes: No visual changes. ENT: No sore throat. Cardiovascular: Positive for chest pain. Respiratory: Denies shortness of breath.  Positive for cough. Gastrointestinal: No abdominal pain.  No nausea, no vomiting.  No diarrhea.  No constipation. Genitourinary: Negative for dysuria. Musculoskeletal: Negative for back pain. Skin: Negative for rash. Neurological: Negative for headaches, focal weakness or numbness.  ____________________________________________   PHYSICAL EXAM:  VITAL SIGNS: ED Triage Vitals  Enc Vitals Group     BP      Pulse      Resp      Temp      Temp src      SpO2      Weight      Height      Head Circumference      Peak Flow      Pain Score      Pain Loc      Pain Edu?      Excl. in [place]?     Constitutional: Alert and oriented. Eyes: Conjunctivae are normal. Head:  Atraumatic. Nose: No congestion/rhinnorhea. Mouth/Throat: Mucous membranes are moist. Neck: Normal ROM Cardiovascular: Normal rate, regular rhythm. Grossly normal heart sounds.  2+ radial pulses bilaterally. Respiratory: Normal respiratory effort.  No retractions. Lungs CTAB.  Tender to palpation in the center of her chest. Gastrointestinal: Soft and nontender. No distention. Genitourinary: deferred Musculoskeletal: No lower extremity tenderness nor edema. Neurologic:  Normal speech and language. No gross focal neurologic deficits are appreciated. Skin:  Skin is warm, dry and intact. No rash noted. Psychiatric: Mood and affect are normal. Speech and behavior are normal.  ____________________________________________   LABS (all labs ordered are listed, but only abnormal results are displayed)  Labs Reviewed  BASIC METABOLIC PANEL - Abnormal; Notable for the following components:      Result Value   Glucose, Bld 125 (*)    All other components within normal limits  CBC WITH DIFFERENTIAL/PLATELET  TROPONIN I (HIGH SENSITIVITY)  TROPONIN I (HIGH SENSITIVITY)   ____________________________________________  EKG  ED ECG REPORT I, Blake Divine, the attending physician, personally viewed and interpreted this ECG.   Date: 12/23/2018  EKG Time: 11:26 AM  Rate: 69  Rhythm: normal EKG, normal sinus rhythm, unchanged from previous tracings  Axis: Normal  Intervals:none  ST&T Change: None  ____________________________________________  ____________________________________________   PROCEDURES  Procedure(s) performed (including Critical Care):  Procedures   ____________________________________________   INITIAL IMPRESSION / ASSESSMENT AND PLAN / ED COURSE       58 year old female presenting with chest pain over the past few days described as sharp and exacerbated when she coughs.  Differential includes ACS, PE, pneumonia, pneumothorax, musculoskeletal, reflux,  bronchitis.  Patient nontoxic-appearing and in no acute distress, initial EKG without ischemic changes.  Initial labs reassuring, troponin within normal limits.  Do not suspect PE as patient recently had negative CTA.  More likely bronchitis with some chest wall inflammation given her chest wall tenderness and atypical symptoms.  Heart score is less than 4, patient relatively low risk for ACS.  Repeat troponin also within normal limits, patient appropriate for discharge home with PCP follow-up.  Patient agrees with plan.      ____________________________________________   FINAL CLINICAL IMPRESSION(S) / ED DIAGNOSES  Final diagnoses:  Atypical chest pain  Bronchitis  Chest wall pain     ED Discharge Orders         Ordered    lidocaine (LIDODERM) 5 %  Every 12 hours     12/23/18 1520           Note:  This document was prepared using Dragon voice recognition software and may include unintentional dictation errors.   Blake Divine, MD 12/23/18 [phone]

## 2018-12-23 NOTE — Telephone Encounter (Signed)
Patient notified

## 2018-12-23 NOTE — ED Triage Notes (Signed)
Pt reports worsening chest pain that began yesterday. Pt states that the pain in the center of chest and states that she has a cough. Pt states that she has recently been checked for PE but it was negative.

## 2018-12-25 DIAGNOSIS — Z780 Asymptomatic menopausal state: Secondary | ICD-10-CM | POA: Diagnosis not present

## 2018-12-25 DIAGNOSIS — N950 Postmenopausal bleeding: Secondary | ICD-10-CM | POA: Diagnosis not present

## 2019-01-01 DIAGNOSIS — R0789 Other chest pain: Secondary | ICD-10-CM | POA: Diagnosis not present

## 2019-01-01 DIAGNOSIS — M25551 Pain in right hip: Secondary | ICD-10-CM | POA: Diagnosis not present

## 2019-01-01 DIAGNOSIS — M797 Fibromyalgia: Secondary | ICD-10-CM | POA: Diagnosis not present

## 2019-01-01 DIAGNOSIS — M25552 Pain in left hip: Secondary | ICD-10-CM | POA: Diagnosis not present

## 2019-01-01 DIAGNOSIS — N951 Menopausal and female climacteric states: Secondary | ICD-10-CM | POA: Diagnosis not present

## 2019-01-02 DIAGNOSIS — R921 Mammographic calcification found on diagnostic imaging of breast: Secondary | ICD-10-CM | POA: Diagnosis not present

## 2019-01-02 LAB — HM MAMMOGRAPHY

## 2019-02-02 DIAGNOSIS — G8929 Other chronic pain: Secondary | ICD-10-CM | POA: Diagnosis not present

## 2019-02-02 DIAGNOSIS — M25552 Pain in left hip: Secondary | ICD-10-CM | POA: Diagnosis not present

## 2019-02-02 DIAGNOSIS — M25551 Pain in right hip: Secondary | ICD-10-CM | POA: Diagnosis not present

## 2019-02-03 DIAGNOSIS — G8929 Other chronic pain: Secondary | ICD-10-CM | POA: Diagnosis not present

## 2019-02-03 DIAGNOSIS — M25551 Pain in right hip: Secondary | ICD-10-CM | POA: Diagnosis not present

## 2019-02-03 DIAGNOSIS — M25552 Pain in left hip: Secondary | ICD-10-CM | POA: Diagnosis not present

## 2019-03-07 DIAGNOSIS — Z01812 Encounter for preprocedural laboratory examination: Secondary | ICD-10-CM | POA: Diagnosis not present

## 2019-03-10 DIAGNOSIS — K644 Residual hemorrhoidal skin tags: Secondary | ICD-10-CM | POA: Diagnosis not present

## 2019-03-10 DIAGNOSIS — K5730 Diverticulosis of large intestine without perforation or abscess without bleeding: Secondary | ICD-10-CM | POA: Diagnosis not present

## 2019-03-10 DIAGNOSIS — Z1211 Encounter for screening for malignant neoplasm of colon: Secondary | ICD-10-CM | POA: Diagnosis not present

## 2019-03-10 DIAGNOSIS — Z86010 Personal history of colonic polyps: Secondary | ICD-10-CM | POA: Diagnosis not present

## 2019-03-10 LAB — HM COLONOSCOPY

## 2019-03-12 ENCOUNTER — Encounter: Payer: Self-pay | Admitting: Student in an Organized Health Care Education/Training Program

## 2019-03-12 ENCOUNTER — Other Ambulatory Visit: Payer: Self-pay

## 2019-03-12 ENCOUNTER — Ambulatory Visit
Payer: Medicare Other | Attending: Student in an Organized Health Care Education/Training Program | Admitting: Student in an Organized Health Care Education/Training Program

## 2019-03-12 DIAGNOSIS — M47817 Spondylosis without myelopathy or radiculopathy, lumbosacral region: Secondary | ICD-10-CM

## 2019-03-12 DIAGNOSIS — M545 Low back pain, unspecified: Secondary | ICD-10-CM

## 2019-03-12 DIAGNOSIS — G8929 Other chronic pain: Secondary | ICD-10-CM

## 2019-03-12 DIAGNOSIS — M4714 Other spondylosis with myelopathy, thoracic region: Secondary | ICD-10-CM

## 2019-03-12 DIAGNOSIS — M797 Fibromyalgia: Secondary | ICD-10-CM

## 2019-03-12 DIAGNOSIS — M25552 Pain in left hip: Secondary | ICD-10-CM

## 2019-03-12 DIAGNOSIS — M170 Bilateral primary osteoarthritis of knee: Secondary | ICD-10-CM

## 2019-03-12 DIAGNOSIS — Z96652 Presence of left artificial knee joint: Secondary | ICD-10-CM

## 2019-03-12 DIAGNOSIS — M25551 Pain in right hip: Secondary | ICD-10-CM

## 2019-03-12 MED ORDER — BACLOFEN 10 MG PO TABS: 10.0000 mg | ORAL_TABLET | Freq: Four times a day (QID) | ORAL | 5 refills | Status: DC | PRN

## 2019-03-12 MED ORDER — PREGABALIN 75 MG PO CAPS: ORAL_CAPSULE | ORAL | 5 refills | Status: DC

## 2019-03-12 NOTE — Progress Notes (Signed)
Pain Management Virtual Encounter Note - Virtual Visit via [place] (real-time audio visits between healthcare provider and patient).   Patient's Phone No. & Preferred Pharmacy:  [phone] (home); There is no such number on file (mobile).; (Preferred) [phone] No e-mail address on record  [facility] [place] [facility] [place] ([place]) [place] [address] Phone: [phone] Fax: [phone]    Pre-screening note:  Our staff contacted Kristin Porter and offered her an "in person", "face-to-face" appointment versus a telephone encounter. She indicated preferring the telephone encounter, at this time.   Reason for Virtual Visit: COVID-19*  Social distancing based on CDC and AMA recommendations.   I contacted [facility] on 03/12/2019 via video conference.      I clearly identified myself as Gillis Santa, MD. I verified that I was speaking with the correct person using two identifiers (Name: Kristin Porter, and date of birth: March 04, 1961).  Advanced Informed Consent I sought verbal advanced consent from Kristin Porter for virtual visit interactions. I informed Kristin Porter of possible security and privacy concerns, risks, and limitations associated with providing "not-in-person" medical evaluation and management services. I also informed Kristin Porter of the availability of "in-person" appointments. Finally, I informed her that there would be a charge for the virtual visit and that she could be  personally, fully or partially, financially responsible for it. Kristin Porter expressed understanding and agreed to proceed.   Historic Elements   Kristin Porter is a 58 y.o. year old, female patient evaluated today after her last encounter by our practice on 12/22/2018. Kristin Porter  has no past medical history on file. She also  has no past surgical history on file. Kristin Porter has a current medication  list which includes the following prescription(s): albuterol, amlodipine, baclofen, fluticasone, lidocaine, paroxetine, pregabalin, and trazodone. She  reports that she has quit smoking. She has never used smokeless tobacco. She reports previous alcohol use. She reports that she does not use drugs. Kristin Porter is allergic to tizanidine; lisinopril; and tramadol.   HPI  Today, she is being contacted for medication management.   Lyrica 75 mg q AM, 150 mg nightly- states that it is helpful and results in less sedation when compared to 75 mg 3 times daily that she was taking before.  Patient also continues her baclofen which does provide her with pain relief.  She is requesting a dose increase.  We will increase her monthly quantity to 120 so that she can take 20 mg with one of her doses as needed OR an additional daily dose of 10 mg (QID prn dosing).  Risks and benefits reviewed.  Laboratory Chemistry Profile (12 mo)  Renal: 12/23/2018: BUN 19; Creatinine, Ser 0.69  Lab Results  Component Value Date   GFRAA >60 12/23/2018   GFRNONAA >60 12/23/2018   Hepatic: No results found for requested labs within last 8760 hours. No results found for: AST, ALT Other: No results found for requested labs within last 8760 hours. Note: Above Lab results reviewed.  Imaging  Last 90 days:  Dg Chest Portable 1 View  Result Date: 12/23/2018 CLINICAL DATA:  Chest pain EXAM: PORTABLE CHEST 1 VIEW COMPARISON:  None. FINDINGS: There is slight atelectasis in the left mid lung. Lungs elsewhere are clear. Heart size and pulmonary vascularity are normal. No adenopathy. There is slight aortic atherosclerosis. No bone lesions. IMPRESSION: Slight left midlung atelectasis. No edema or consolidation. Heart size  normal. Aortic Atherosclerosis (ICD10-I70.0). Electronically Signed   By: Lowella Grip III M.D.   On: 12/23/2018 11:46    Assessment  The primary encounter diagnosis was Thoracic myelopathy (due to syrinx, s/p  excision 12/23/2017). Diagnoses of Fibromyalgia, Chronic pain of both hips, Chronic bilateral low back pain without sciatica, Lumbosacral spondylosis without myelopathy, History of total knee arthroplasty, left, and Primary osteoarthritis of both knees were also pertinent to this visit.  Plan of Care  I have changed Kristin Porter's baclofen. I am also having her maintain her albuterol, PARoxetine, amLODipine, fluticasone, traZODone, lidocaine, and pregabalin.  Pharmacotherapy (Medications Ordered): Meds ordered this encounter  Medications  . baclofen (LIORESAL) 10 MG tablet    Sig: Take 1 tablet (10 mg total) by mouth 4 (four) times daily as needed for muscle spasms.    Dispense:  120 each    Refill:  5  . pregabalin (LYRICA) 75 MG capsule    Sig: 75 mg daily, 150 mg qhs    Dispense:  90 capsule    Refill:  5   Follow-up plan:   Return in about 6 months (around 09/10/2019) for Medication Management, in person.    Recent Visits No visits were found meeting these conditions.  Showing recent visits within past 90 days and meeting all other requirements   Today's Visits Date Type Provider Dept  03/12/19 Office Visit Gillis Santa, MD [facility]  Showing today's visits and meeting all other requirements   Future Appointments No visits were found meeting these conditions.  Showing future appointments within next 90 days and meeting all other requirements   I discussed the assessment and treatment plan with the patient. The patient was provided an opportunity to ask questions and all were answered. The patient agreed with the plan and demonstrated an understanding of the instructions.  Patient advised to call back or seek an in-person evaluation if the symptoms or condition worsens.  Total duration of non-face-to-face encounter: 15 minutes.  Note by: Gillis Santa, MD Date: 03/12/2019; Time: 12:11 PM  Note: This dictation was prepared with Dragon dictation. Any  transcriptional errors that may result from this process are unintentional.  Disclaimer:  * Given the special circumstances of the COVID-19 pandemic, the federal government has announced that the Office for Civil Rights (OCR) will exercise its enforcement discretion and will not impose penalties on physicians using telehealth in the event of noncompliance with regulatory requirements under the [place] (HIPAA) in connection with the good faith provision of telehealth during the XX123456 national public health emergency. ([place])

## 2019-04-30 DIAGNOSIS — N951 Menopausal and female climacteric states: Secondary | ICD-10-CM | POA: Diagnosis not present

## 2019-04-30 DIAGNOSIS — E785 Hyperlipidemia, unspecified: Secondary | ICD-10-CM | POA: Diagnosis not present

## 2019-04-30 DIAGNOSIS — M797 Fibromyalgia: Secondary | ICD-10-CM | POA: Diagnosis not present

## 2019-04-30 DIAGNOSIS — I10 Essential (primary) hypertension: Secondary | ICD-10-CM | POA: Diagnosis not present

## 2019-04-30 DIAGNOSIS — K219 Gastro-esophageal reflux disease without esophagitis: Secondary | ICD-10-CM | POA: Diagnosis not present

## 2019-05-19 DIAGNOSIS — M166 Other bilateral secondary osteoarthritis of hip: Secondary | ICD-10-CM | POA: Diagnosis not present

## 2019-05-19 DIAGNOSIS — G8929 Other chronic pain: Secondary | ICD-10-CM | POA: Diagnosis not present

## 2019-05-19 DIAGNOSIS — M25551 Pain in right hip: Secondary | ICD-10-CM | POA: Diagnosis not present

## 2019-05-19 DIAGNOSIS — M25552 Pain in left hip: Secondary | ICD-10-CM | POA: Diagnosis not present

## 2019-05-20 DIAGNOSIS — M166 Other bilateral secondary osteoarthritis of hip: Secondary | ICD-10-CM | POA: Insufficient documentation

## 2019-06-19 DIAGNOSIS — M76891 Other specified enthesopathies of right lower limb, excluding foot: Secondary | ICD-10-CM | POA: Diagnosis not present

## 2019-06-19 DIAGNOSIS — M76892 Other specified enthesopathies of left lower limb, excluding foot: Secondary | ICD-10-CM | POA: Diagnosis not present

## 2019-06-30 DIAGNOSIS — M5137 Other intervertebral disc degeneration, lumbosacral region: Secondary | ICD-10-CM | POA: Diagnosis not present

## 2019-06-30 DIAGNOSIS — M4807 Spinal stenosis, lumbosacral region: Secondary | ICD-10-CM | POA: Diagnosis not present

## 2019-06-30 DIAGNOSIS — M160 Bilateral primary osteoarthritis of hip: Secondary | ICD-10-CM | POA: Diagnosis not present

## 2019-06-30 DIAGNOSIS — M1611 Unilateral primary osteoarthritis, right hip: Secondary | ICD-10-CM | POA: Diagnosis not present

## 2019-06-30 DIAGNOSIS — M1612 Unilateral primary osteoarthritis, left hip: Secondary | ICD-10-CM | POA: Diagnosis not present

## 2019-06-30 DIAGNOSIS — M461 Sacroiliitis, not elsewhere classified: Secondary | ICD-10-CM | POA: Diagnosis not present

## 2019-06-30 DIAGNOSIS — R600 Localized edema: Secondary | ICD-10-CM | POA: Diagnosis not present

## 2019-07-02 DIAGNOSIS — M160 Bilateral primary osteoarthritis of hip: Secondary | ICD-10-CM | POA: Diagnosis not present

## 2019-07-02 DIAGNOSIS — G4700 Insomnia, unspecified: Secondary | ICD-10-CM | POA: Diagnosis not present

## 2019-07-02 DIAGNOSIS — E785 Hyperlipidemia, unspecified: Secondary | ICD-10-CM | POA: Diagnosis not present

## 2019-07-02 DIAGNOSIS — G8929 Other chronic pain: Secondary | ICD-10-CM | POA: Diagnosis not present

## 2019-07-02 DIAGNOSIS — M797 Fibromyalgia: Secondary | ICD-10-CM | POA: Diagnosis not present

## 2019-07-02 DIAGNOSIS — I10 Essential (primary) hypertension: Secondary | ICD-10-CM | POA: Diagnosis not present

## 2019-07-02 DIAGNOSIS — M25552 Pain in left hip: Secondary | ICD-10-CM | POA: Diagnosis not present

## 2019-07-02 DIAGNOSIS — M25551 Pain in right hip: Secondary | ICD-10-CM | POA: Diagnosis not present

## 2019-07-07 DIAGNOSIS — M160 Bilateral primary osteoarthritis of hip: Secondary | ICD-10-CM | POA: Diagnosis not present

## 2019-07-07 DIAGNOSIS — M76891 Other specified enthesopathies of right lower limb, excluding foot: Secondary | ICD-10-CM | POA: Diagnosis not present

## 2019-07-07 DIAGNOSIS — M76892 Other specified enthesopathies of left lower limb, excluding foot: Secondary | ICD-10-CM | POA: Diagnosis not present

## 2019-07-07 DIAGNOSIS — Z6841 Body Mass Index (BMI) 40.0 and over, adult: Secondary | ICD-10-CM | POA: Insufficient documentation

## 2019-08-20 ENCOUNTER — Encounter: Payer: Self-pay | Admitting: Family Medicine

## 2019-08-20 ENCOUNTER — Ambulatory Visit: Payer: Self-pay | Admitting: Family Medicine

## 2019-08-20 ENCOUNTER — Ambulatory Visit (INDEPENDENT_AMBULATORY_CARE_PROVIDER_SITE_OTHER): Payer: Medicare Other | Admitting: Family Medicine

## 2019-08-20 ENCOUNTER — Other Ambulatory Visit: Payer: Self-pay

## 2019-08-20 VITALS — BP 158/76 | HR 72 | Temp 97.3°F | Ht 59.0 in | Wt 210.8 lb

## 2019-08-20 DIAGNOSIS — Z7689 Persons encountering health services in other specified circumstances: Secondary | ICD-10-CM

## 2019-08-20 DIAGNOSIS — I10 Essential (primary) hypertension: Secondary | ICD-10-CM | POA: Diagnosis not present

## 2019-08-20 DIAGNOSIS — G4700 Insomnia, unspecified: Secondary | ICD-10-CM | POA: Insufficient documentation

## 2019-08-20 DIAGNOSIS — Z0000 Encounter for general adult medical examination without abnormal findings: Secondary | ICD-10-CM | POA: Diagnosis not present

## 2019-08-20 DIAGNOSIS — R5382 Chronic fatigue, unspecified: Secondary | ICD-10-CM

## 2019-08-20 MED ORDER — BLOOD PRESSURE KIT DEVI: 1.0000 [IU] | Freq: Two times a day (BID) | 0 refills | Status: AC

## 2019-08-20 NOTE — Progress Notes (Signed)
Subjective:    Patient ID: Kristin Porter, female    DOB: 1961-02-03, 59 y.o.   MRN: [id number]  Kristin Porter is a 59 y.o. female presenting on 08/20/2019 for Establish Care   HPI  Previous PCP was at [facility].  Records will be requested.  Past medical, family, and surgical history reviewed w/ pt.  No acute concerns today.  Reports had her Mammo completed last 10/2018 and her colonoscopy was in 02/2019.  Due for PAP testing at next visit.  Is currently scheduled to meet with Dr. Holley Raring for pain management 09/10/2019.   Depression screen PHQ 2/9 08/20/2019  Decreased Interest 1  Down, Depressed, Hopeless 1  PHQ - 2 Score 2  Altered sleeping 3  Tired, decreased energy 1  Change in appetite 1  Feeling bad or failure about yourself  0  Trouble concentrating 0  Moving slowly or fidgety/restless 0  Suicidal thoughts 0  PHQ-9 Score 7  Difficult doing work/chores Somewhat difficult    Social History   Tobacco Use  . Smoking status: Former Research scientist (life sciences)  . Smokeless tobacco: Never Used  Substance Use Topics  . Alcohol use: Not Currently  . Drug use: Never    Review of Systems  Constitutional: Negative.   HENT: Negative.   Eyes: Negative.   Respiratory: Negative.   Cardiovascular: Negative.   Gastrointestinal: Negative.   Endocrine: Negative.   Genitourinary: Negative.   Musculoskeletal: Negative.   Skin: Negative.   Allergic/Immunologic: Negative.   Neurological: Negative.   Hematological: Negative.   Psychiatric/Behavioral: Negative.    Per HPI unless specifically indicated above     Objective:    BP (!) 158/76 (BP Location: Left Arm, Patient Position: Sitting, Cuff Size: Large)   Pulse 72   Temp (!) 97.3 F (36.3 C) (Temporal)   Ht _0  (1.499 m)   Wt 210 lb 12.8 oz (95.6 kg)   BMI 42.58 kg/m   Wt Readings from Last 3 Encounters:  08/20/19 210 lb 12.8 oz (95.6 kg)  12/23/18 213 lb (96.6 kg)  03/09/18 200 lb (90.7 kg)    Physical  Exam Vitals reviewed.  Constitutional:      General: She is not in acute distress.    Appearance: Normal appearance. She is well-developed and well-groomed. She is obese. She is not ill-appearing or toxic-appearing.  HENT:     Head: Normocephalic.  Eyes:     General: Lids are normal. Vision grossly intact.        Right eye: No discharge.        Left eye: No discharge.     Extraocular Movements: Extraocular movements intact.     Conjunctiva/sclera: Conjunctivae normal.     Pupils: Pupils are equal, round, and reactive to light.  Cardiovascular:     Rate and Rhythm: Normal rate and regular rhythm.     Pulses: Normal pulses.          Dorsalis pedis pulses are 2+ on the right side and 2+ on the left side.       Posterior tibial pulses are 2+ on the right side and 2+ on the left side.     Heart sounds: Normal heart sounds. No murmur. No friction rub. No gallop.   Pulmonary:     Effort: Pulmonary effort is normal. No respiratory distress.     Breath sounds: Normal breath sounds.  Abdominal:     General: Abdomen is flat. Bowel sounds are normal. There is no distension.  Palpations: Abdomen is soft.  Musculoskeletal:     Right lower leg: No edema.     Left lower leg: No edema.  Feet:     Right foot:     Skin integrity: Skin integrity normal.     Left foot:     Skin integrity: Skin integrity normal.  Skin:    General: Skin is warm and dry.     Capillary Refill: Capillary refill takes less than 2 seconds.  Neurological:     General: No focal deficit present.     Mental Status: She is alert and oriented to person, place, and time.     Cranial Nerves: No cranial nerve deficit.     Sensory: No sensory deficit.     Motor: No weakness.     Coordination: Coordination normal.     Gait: Gait normal.  Psychiatric:        Attention and Perception: Attention and perception normal.        Mood and Affect: Mood and affect normal.        Speech: Speech normal.        Behavior: Behavior  normal. Behavior is cooperative.        Thought Content: Thought content normal.        Cognition and Memory: Cognition and memory normal.        Judgment: Judgment normal.     Results for orders placed or performed during the hospital encounter of 12/23/18  CBC with Differential  Result Value Ref Range   WBC 7.6 4.0 - 10.5 K/uL   RBC 4.48 3.87 - 5.11 MIL/uL   Hemoglobin 13.1 12.0 - 15.0 g/dL   HCT 39.4 36.0 - 46.0 %   MCV 87.9 80.0 - 100.0 fL   MCH 29.2 26.0 - 34.0 pg   MCHC 33.2 30.0 - 36.0 g/dL   RDW 13.8 11.5 - 15.5 %   Platelets 278 150 - 400 K/uL   nRBC 0.0 0.0 - 0.2 %   Neutrophils Relative % 64 %   Neutro Abs 4.8 1.7 - 7.7 K/uL   Lymphocytes Relative 25 %   Lymphs Abs 1.9 0.7 - 4.0 K/uL   Monocytes Relative 10 %   Monocytes Absolute 0.8 0.1 - 1.0 K/uL   Eosinophils Relative 1 %   Eosinophils Absolute 0.1 0.0 - 0.5 K/uL   Basophils Relative 0 %   Basophils Absolute 0.0 0.0 - 0.1 K/uL   Immature Granulocytes 0 %   Abs Immature Granulocytes 0.03 0.00 - 0.07 K/uL  Basic metabolic panel  Result Value Ref Range   Sodium 140 135 - 145 mmol/L   Potassium 4.4 3.5 - 5.1 mmol/L   Chloride 106 98 - 111 mmol/L   CO2 25 22 - 32 mmol/L   Glucose, Bld 125 (H) 70 - 99 mg/dL   BUN 19 6 - 20 mg/dL   Creatinine, Ser 0.69 0.44 - 1.00 mg/dL   Calcium 9.2 8.9 - 10.3 mg/dL   GFR calc non Af Amer >60 >60 mL/min   GFR calc Af Amer >60 >60 mL/min   Anion gap 9 5 - 15  Troponin I (High Sensitivity)  Result Value Ref Range   Troponin I (High Sensitivity) 4 <18 ng/L  Troponin I (High Sensitivity)  Result Value Ref Range   Troponin I (High Sensitivity) 4 <18 ng/L      Assessment & Plan:   Problem List Items Addressed This Visit      Cardiovascular and Mediastinum  Hypertension    Uncontrolled hypertension.  BP goal < 130/80.  Has not been taking BP readings at home.  BP cuff and machine ordered today.  Pt is not working on lifestyle modifications.  Taking medications tolerating  well without side effects.  Reports BP is elevated due to being in pain from fibromyalgia. Will watch BP with home logs and repeat BP in clinic before increasing medication. Complications: Morbid obesity, hyperlipidemia (historical)  Plan: 1. Continue taking amlodipine 23m daily and losartan 580mdaily 2. Obtain labs at physical appointment in 4 weeks  3. Encouraged heart healthy diet and increasing exercise to 30 minutes most days of the week, going no more than 2 days in a row without exercise. 4. Check BP 1-2 x per day at home, keep log, and bring to clinic at next appointment. 5. Follow up 1 month.        Relevant Medications   atorvastatin (LIPITOR) 80 MG tablet   losartan (COZAAR) 50 MG tablet   amLODipine (NORVASC) 10 MG tablet   Blood Pressure Monitoring (BLOOD PRESSURE KIT) DEVI     Other   Encounter to establish care with new doctor    Transferring from [facility] Records requested.  Will have her follow up in 1 month for physical with labs and to discuss preventative care screenings she is due for.       Other Visit Diagnoses    Routine medical exam    -  Primary   Relevant Orders   POCT Urinalysis Dipstick   Chronic fatigue       Relevant Orders   Nocturnal polysomnography      Meds ordered this encounter  Medications  . Blood Pressure Monitoring (BLOOD PRESSURE KIT) DEVI    Sig: 1 Units by Does not apply route in the morning and at bedtime.    Dispense:  1 each    Refill:  0      Follow up plan: Return in about 4 weeks (around 09/17/2019) for Physical, PAP, Labs, HTN F/U.   NiHarlin RainFNBedford Parkamily Nurse Practitioner [facility] 08/20/2019, 10:57 AM

## 2019-08-20 NOTE — Assessment & Plan Note (Signed)
Transferring from [facility].  Records requested.  Will have her follow up in 1 month for physical with labs and to discuss preventative care screenings she is due for.

## 2019-08-20 NOTE — Patient Instructions (Addendum)
I have sent in an order for a blood pressure machine with cuff.  Begin taking your blood pressure twice daily and we will follow up in 4 weeks.  If you find that your blood pressure is running greater than 130/80 to call us sooner and we will increase your blood pressure medications.  Try to get exercise a minimum of 30 minutes per day at least 5 days per week as well as  adequate water intake all while measuring blood pressure a few times per week.  Keep a blood pressure log and bring back to clinic at your next visit.  If your readings are consistently over 140/90 to contact our office/send me a MyChart message and we will see you sooner.  Can try DASH and Mediterranean diet options, avoiding processed foods, lowering sodium intake, avoiding pork products, and eating a plant based diet for optimal health.      Mediterranean Diet  Why follow it? Research shows. . Those who follow the Mediterranean diet have a reduced risk of heart disease  . The diet is associated with a reduced incidence of Parkinson's and Alzheimer's diseases . People following the diet may have longer life expectancies and lower rates of chronic diseases  . The Dietary Guidelines for Americans recommends the Mediterranean diet as an eating plan to promote health and prevent disease  What Is the Mediterranean Diet?  . Healthy eating plan based on typical foods and recipes of Mediterranean-style cooking . The diet is primarily a plant based diet; these foods should make up a majority of meals   Starches - Plant based foods should make up a majority of meals - They are an important sources of vitamins, minerals, energy, antioxidants, and fiber - Choose whole grains, foods high in fiber and minimally processed items  - Typical grain sources include wheat, oats, barley, corn, brown rice, bulgar, farro, millet, polenta, couscous  - Various types of beans include chickpeas, lentils, fava beans, black beans, white beans   Fruits   Veggies - Large quantities of antioxidant rich fruits & veggies; 6 or more servings  - Vegetables can be eaten raw or lightly drizzled with oil and cooked  - Vegetables common to the traditional Mediterranean Diet include: artichokes, arugula, beets, broccoli, brussel sprouts, cabbage, carrots, celery, collard greens, cucumbers, eggplant, kale, leeks, lemons, lettuce, mushrooms, okra, onions, peas, peppers, potatoes, pumpkin, radishes, rutabaga, shallots, spinach, sweet potatoes, turnips, zucchini - Fruits common to the Mediterranean Diet include: apples, apricots, avocados, cherries, clementines, dates, figs, grapefruits, grapes, melons, nectarines, oranges, peaches, pears, pomegranates, strawberries, tangerines  Fats - Replace butter and margarine with healthy oils, such as olive oil, canola oil, and tahini  - Limit nuts to no more than a handful a day  - Nuts include walnuts, almonds, pecans, pistachios, pine nuts  - Limit or avoid candied, honey roasted or heavily salted nuts - Olives are central to the [employer] - can be eaten whole or used in a variety of dishes   Meats Protein - Limiting red meat: no more than a few times a month - When eating red meat: choose lean cuts and keep the portion to the size of deck of cards - Eggs: approx. 0 to 4 times a week  - Fish and lean poultry: at least 2 a week  - Healthy protein sources include, chicken, [place], lean beef, lamb - Increase intake of seafood such as tuna, salmon, trout, mackerel, shrimp, scallops - Avoid or limit high fat processed meats such  as sausage and bacon  Dairy - Include moderate amounts of low fat dairy products  - Focus on healthy dairy such as fat free yogurt, skim milk, low or reduced fat cheese - Limit dairy products higher in fat such as whole or 2% milk, cheese, ice cream  Alcohol - Moderate amounts of red wine is ok  - No more than 5 oz daily for women (all ages) and men older than age 88  - No more than 10 oz  of wine daily for men younger than 74  Other - Limit sweets and other desserts  - Use herbs and spices instead of salt to flavor foods  - Herbs and spices common to the traditional Mediterranean Diet include: basil, bay leaves, chives, cloves, cumin, fennel, garlic, lavender, marjoram, mint, oregano, parsley, pepper, rosemary, sage, savory, sumac, tarragon, thyme   It's not just a diet, it's a lifestyle:  . The Mediterranean diet includes lifestyle factors typical of those in the region  . Foods, drinks and meals are best eaten with others and savored . Daily physical activity is important for overall good health . This could be strenuous exercise like running and aerobics . This could also be more leisurely activities such as walking, housework, yard-work, or taking the stairs . Moderation is the key; a balanced and healthy diet accommodates most foods and drinks . Consider portion sizes and frequency of consumption of certain foods   Meal Ideas & Options:  . Breakfast:  o Whole wheat toast or whole wheat English muffins with peanut butter & hard boiled egg o Steel cut oats topped with apples & cinnamon and skim milk  o Fresh fruit: banana, strawberries, melon, berries, peaches  o Smoothies: strawberries, bananas, greek yogurt, peanut butter o Low fat greek yogurt with blueberries and granola  o Egg white omelet with spinach and mushrooms o Breakfast couscous: whole wheat couscous, apricots, skim milk, cranberries  . Sandwiches:  o Hummus and grilled vegetables (peppers, zucchini, squash) on whole wheat bread   o Grilled chicken on whole wheat pita with lettuce, tomatoes, cucumbers or tzatziki  o Tuna salad on whole wheat bread: tuna salad made with greek yogurt, olives, red peppers, capers, green onions o Garlic rosemary lamb pita: lamb sauted with garlic, rosemary, salt & pepper; add lettuce, cucumber, greek yogurt to pita - flavor with lemon juice and black pepper  . Seafood:   o Mediterranean grilled salmon, seasoned with garlic, basil, parsley, lemon juice and black pepper o Shrimp, lemon, and spinach whole-grain pasta salad made with low fat greek yogurt  o Seared scallops with lemon orzo  o Seared tuna steaks seasoned salt, pepper, coriander topped with tomato mixture of olives, tomatoes, olive oil, minced garlic, parsley, green onions and cappers  . Meats:  o Herbed greek chicken salad with kalamata olives, cucumber, feta  o Red bell peppers stuffed with spinach, bulgur, lean ground beef (or lentils) & topped with feta   o Kebabs: skewers of chicken, tomatoes, onions, zucchini, squash  o [place] burgers: made with red onions, mint, dill, lemon juice, feta cheese topped with roasted red peppers . Vegetarian o Cucumber salad: cucumbers, artichoke hearts, celery, red onion, feta cheese, tossed in olive oil & lemon juice  o Hummus and whole grain pita points with a greek salad (lettuce, tomato, feta, olives, cucumbers, red onion) o Lentil soup with celery, carrots made with vegetable broth, garlic, salt and pepper  o Tabouli salad: parsley, bulgur, mint, scallions, cucumbers, tomato, radishes, lemon  juice, olive oil, salt and pepper.  I have sent in a referral for a sleep study, someone will call you to schedule.  If you haven't heard anything back in 1 week to contact our office and we will follow up on this.  We will plan to see you back in 1 month for your physical, preventative care screenings and to follow up on your hypertension.   You will receive a survey after today's visit either digitally by e-mail or paper by [employer]. Your experiences and feedback matter to [place].  Please respond so we know how we are doing as we provide care for you.  Call us with any questions/concerns/needs.  It is my goal to be available to you for your health concerns.  Thanks for choosing me to be a partner in your healthcare needs!  Harlin Rain, FNP-C Family Nurse  Practitioner [facility] Phone: [phone]

## 2019-08-20 NOTE — Assessment & Plan Note (Signed)
Uncontrolled hypertension.  BP goal < 130/80.  Has not been taking BP readings at home.  BP cuff and machine ordered today.  Pt is not working on lifestyle modifications.  Taking medications tolerating well without side effects.  Reports BP is elevated due to being in pain from fibromyalgia. Will watch BP with home logs and repeat BP in clinic before increasing medication. Complications: Morbid obesity, hyperlipidemia (historical)  Plan: 1. Continue taking amlodipine 10mg  daily and losartan 50mg  daily 2. Obtain labs at physical appointment in 4 weeks  3. Encouraged heart healthy diet and increasing exercise to 30 minutes most days of the week, going no more than 2 days in a row without exercise. 4. Check BP 1-2 x per day at home, keep log, and bring to clinic at next appointment. 5. Follow up 1 month.

## 2019-08-24 ENCOUNTER — Encounter: Payer: Self-pay | Admitting: Emergency Medicine

## 2019-08-24 ENCOUNTER — Other Ambulatory Visit: Payer: Self-pay

## 2019-08-24 ENCOUNTER — Emergency Department
Admission: EM | Admit: 2019-08-24 | Discharge: 2019-08-24 | Disposition: A | Discharge status: Home / Self Care | Payer: Medicare Other | Attending: Emergency Medicine | Admitting: Emergency Medicine

## 2019-08-24 DIAGNOSIS — M797 Fibromyalgia: Secondary | ICD-10-CM | POA: Insufficient documentation

## 2019-08-24 DIAGNOSIS — M160 Bilateral primary osteoarthritis of hip: Secondary | ICD-10-CM | POA: Insufficient documentation

## 2019-08-24 DIAGNOSIS — I10 Essential (primary) hypertension: Secondary | ICD-10-CM | POA: Insufficient documentation

## 2019-08-24 DIAGNOSIS — M545 Low back pain, unspecified: Secondary | ICD-10-CM

## 2019-08-24 DIAGNOSIS — Z79899 Other long term (current) drug therapy: Secondary | ICD-10-CM | POA: Insufficient documentation

## 2019-08-24 HISTORY — DX: Unspecified osteoarthritis, unspecified site: M19.90

## 2019-08-24 HISTORY — DX: Essential (primary) hypertension: I10

## 2019-08-24 HISTORY — DX: Fibromyalgia: M79.7

## 2019-08-24 HISTORY — DX: Polyneuropathy, unspecified: G62.9

## 2019-08-24 MED ORDER — OXYCODONE-ACETAMINOPHEN 5-325 MG PO TABS
1.0000 | ORAL_TABLET | Freq: Once | ORAL | Status: AC
  Administered 2019-08-24: 21:00:00 1 via ORAL
  Filled 2019-08-24: qty 1

## 2019-08-24 MED ORDER — OXYCODONE-ACETAMINOPHEN 5-325 MG PO TABS: 1.0000 | ORAL_TABLET | Freq: Four times a day (QID) | ORAL | 0 refills | Status: AC | PRN

## 2019-08-24 MED ORDER — PREDNISONE 50 MG PO TABS: ORAL_TABLET | ORAL | 0 refills | Status: DC

## 2019-08-24 MED ORDER — HYDROMORPHONE HCL 1 MG/ML IJ SOLN
0.5000 mg | Freq: Once | INTRAMUSCULAR | Status: AC
  Administered 2019-08-24: 0.5 mg via INTRAMUSCULAR
  Filled 2019-08-24: qty 1

## 2019-08-24 MED ORDER — ONDANSETRON 4 MG PO TBDP
4.0000 mg | ORAL_TABLET | Freq: Once | ORAL | Status: AC
  Administered 2019-08-24: 4 mg via ORAL
  Filled 2019-08-24: qty 1

## 2019-08-24 MED ORDER — PREDNISONE 20 MG PO TABS
60.0000 mg | ORAL_TABLET | Freq: Once | ORAL | Status: AC
  Administered 2019-08-24: 60 mg via ORAL
  Filled 2019-08-24: qty 3

## 2019-08-24 NOTE — ED Triage Notes (Signed)
Pt presents to ED via POV with c/o lower back pain. Pt states hx of arthritis to both hips, pt states pain has been progressively worsening. Pt ambulatory with a can to triage room at this time .

## 2019-08-24 NOTE — ED Provider Notes (Signed)
Emergency Department Provider Note  ____________________________________________  Time seen: Approximately 8:35 PM  I have reviewed the triage vital signs and the nursing notes.   HISTORY  Chief Complaint Back Pain   Historian Patient     HPI Kristin Porter is a 59 y.o. female presents to the emergency department with bilateral low back pain with right lower extremity radiculopathy.  Patient states that she has a history of fibromyalgia and has had low back pain for many years but states that she is experiencing an acute flare.  She has tried tramadol and anti-inflammatories at home with little relief.  She has an appointment with a pain management facility at the end of the month and is not currently receiving chronic narcotic pain medications.  No bowel or bladder incontinence or saddle anesthesia.  No fever or chills at home.  No dysuria, hematuria or increased urinary frequency.   Past Medical History:  Diagnosis Date  . Arthritis    Bilateral hips  . Fibromyalgia   . Hypertension   . Neuropathy      Immunizations up to date:  Yes.     Past Medical History:  Diagnosis Date  . Arthritis    Bilateral hips  . Fibromyalgia   . Hypertension   . Neuropathy     Patient Active Problem List   Diagnosis Date Noted  . Insomnia 08/20/2019  . Encounter to establish care with new doctor 08/20/2019  . BMI 40.0-44.9, adult ([place]) 07/07/2019  . Other bilateral secondary osteoarthritis of hip 05/20/2019  . Chronic pain of both hips 04/16/2018  . Prediabetes 01/23/2018  . Hot flashes 01/23/2018  . Thoracic myelopathy 12/26/2017  . Syrinx of spinal cord ([place]) 05/10/2017  . Lumbosacral spondylosis without myelopathy 09/05/2016  . History of total knee arthroplasty, left 08/16/2014  . Numbness in feet 05/17/2014  . Multiple joint pain 12/09/2013  . Hypertension 02/13/2013  . OA (osteoarthritis) of knee 07/30/2012  . GERD (gastroesophageal reflux disease) 06/19/2012   . Gait instability 02/24/2012  . Low back pain 02/18/2012  . Depression 10/30/2011  . Hyperlipidemia 02/17/2011  . Fibromyalgia 12/01/2010  . Dysesthesia 12/01/2010    History reviewed. No pertinent surgical history.  Prior to Admission medications   Medication Sig Start Date End Date Taking? Authorizing Provider  albuterol (PROVENTIL) (2.5 MG/3ML) 0.083% nebulizer solution Take 2.5 mg by nebulization every 6 (six) hours as needed for wheezing or shortness of breath.    [provider]  amitriptyline (ELAVIL) 10 MG tablet Take by mouth. 07/02/19 07/01/20  [provider]  amLODipine (NORVASC) 10 MG tablet Take by mouth. 04/30/19 04/29/20  [provider]  atorvastatin (LIPITOR) 80 MG tablet Take 80 mg by mouth daily. 03/18/19   [provider]  baclofen (LIORESAL) 10 MG tablet Take 1 tablet (10 mg total) by mouth 4 (four) times daily as needed for muscle spasms. 03/12/19   Gillis Santa, MD  Blood Pressure Monitoring (BLOOD PRESSURE KIT) DEVI 1 Units by Does not apply route in the morning and at bedtime. 08/20/19   Malfi, Lupita Raider, FNP  diclofenac Sodium (VOLTAREN) 1 % GEL Apply topically. 01/01/19 01/01/20  [provider]  fluticasone (FLONASE) 50 MCG/ACT nasal spray Place 1 spray into both nostrils 2 (two) times daily.    [provider]  lidocaine (LIDODERM) 5 % Place 1 patch onto the skin every 12 (twelve) hours. Remove & Discard patch within 12 hours or as directed by MD 12/23/18 12/23/19  Blake Divine, MD  losartan (COZAAR) 50 MG tablet Take by mouth. 07/02/19 07/01/20  [provider]  meloxicam (MOBIC) 15 MG tablet Take by mouth. 04/30/19   [provider]  NARCAN 4 MG/0.1ML LIQD nasal spray kit CALL 911. ADMINISTER A SINGLE SPRAY OF NARCAN IN ONE NOSTRIL. REPEAT EVERY 3 MINUTES AS NEEDED IF NO OR MINIMAL RESPONSE. 07/21/19   [provider]  omeprazole (PRILOSEC) 40 MG capsule Take by mouth. 12/18/18 12/18/19   [provider]  oxyCODONE-acetaminophen (PERCOCET/ROXICET) 5-325 MG tablet Take 1 tablet by mouth every 6 (six) hours as needed for up to 3 days. 08/24/19 08/27/19  Lannie Fields, PA-C  PARoxetine (PAXIL) 10 MG tablet Take 10 mg by mouth daily.    [provider]  predniSONE (DELTASONE) 50 MG tablet Take one tablet once daily for the next five days. 08/24/19   Lannie Fields, PA-C  pregabalin (LYRICA) 75 MG capsule 75 mg daily, 150 mg qhs 03/12/19   Gillis Santa, MD  traMADol (ULTRAM) 50 MG tablet Take by mouth. 04/30/19   [provider]  traZODone (DESYREL) 50 MG tablet Take 50 mg by mouth at bedtime.    [provider]    Allergies Tizanidine, Lisinopril, and Tramadol  No family history on file.  Social History Social History   Tobacco Use  . Smoking status: Former Research scientist (life sciences)  . Smokeless tobacco: Never Used  Substance Use Topics  . Alcohol use: Not Currently  . Drug use: Never     Review of Systems  Constitutional: No fever/chills Eyes:  No discharge ENT: No upper respiratory complaints. Respiratory: no cough. No SOB/ use of accessory muscles to breath Gastrointestinal:   No nausea, no vomiting.  No diarrhea.  No constipation. Musculoskeletal: Patient has low back pain.  Skin: Negative for rash, abrasions, lacerations, ecchymosis.    ____________________________________________   PHYSICAL EXAM:  VITAL SIGNS: ED Triage Vitals  Enc Vitals Group     BP 08/24/19 1745 (!) 154/90     Pulse Rate 08/24/19 1745 94     Resp 08/24/19 1745 17     Temp 08/24/19 1745 98.1 F (36.7 C)     Temp Source 08/24/19 1745 Oral     SpO2 08/24/19 1745 98 %     Weight 08/24/19 1744 203 lb (92.1 kg)     Height 08/24/19 1744 '4\' 11"'  (1.499 m)     Head Circumference --      Peak Flow --      Pain Score 08/24/19 1811 10     Pain Loc --      Pain Edu? --      Excl. in [place]? --      Constitutional: Alert and oriented. Well appearing and in no acute  distress. Eyes: Conjunctivae are normal. PERRL. EOMI. Head: Atraumatic. Cardiovascular: Normal rate, regular rhythm. Normal S1 and S2.  Good peripheral circulation. Respiratory: Normal respiratory effort without tachypnea or retractions. Lungs CTAB. Good air entry to the bases with no decreased or absent breath sounds Gastrointestinal: Bowel sounds x 4 quadrants. Soft and nontender to palpation. No guarding or rigidity. No distention. Musculoskeletal: Full range of motion to all extremities. No obvious deformities noted.  Patient has paraspinal muscle tenderness along the lumbar spine.  Positive straight leg raise test on the right. Neurologic:  Normal for age. No gross focal neurologic deficits are appreciated.  Skin:  Skin is warm, dry and intact. No rash noted. Psychiatric: Mood and affect are normal for age. Speech and behavior are  normal.   ____________________________________________   LABS (all labs ordered are listed, but only abnormal results are displayed)  Labs Reviewed - No data to display ____________________________________________  EKG   ____________________________________________  RADIOLOGY   No results found.  ____________________________________________    PROCEDURES  Procedure(s) performed:     Procedures     Medications  oxyCODONE-acetaminophen (PERCOCET/ROXICET) 5-325 MG per tablet 1 tablet (1 tablet Oral Given 08/24/19 2044)  ondansetron (ZOFRAN-ODT) disintegrating tablet 4 mg (4 mg Oral Given 08/24/19 2044)  predniSONE (DELTASONE) tablet 60 mg (60 mg Oral Given 08/24/19 2044)  HYDROmorphone (DILAUDID) injection 0.5 mg (0.5 mg Intramuscular Given 08/24/19 2153)     ____________________________________________   INITIAL IMPRESSION / ASSESSMENT AND PLAN / ED COURSE  Pertinent labs & imaging results that were available during my care of the patient were reviewed by me and considered in my medical decision making (see chart for details).     Assessment and plan: Low Back Pain:  59 year old female presents to the emergency department with concern for an acute fibromyalgia flare of her low back pain.  Patient was given Percocet and prednisone in the emergency department she reported no change in her pain.  I offered her a one-time dose of hydromorphone and she stated that her pain improved "some".  Patient was discharged with a short course of Percocet and a 5-day course of prednisone.  She was advised to keep appointment with pain management facility at the end of the month.  All patient questions were answered.  ____________________________________________  FINAL CLINICAL IMPRESSION(S) / ED DIAGNOSES  Final diagnoses:  Acute bilateral low back pain without sciatica      NEW MEDICATIONS STARTED DURING THIS VISIT:  ED Discharge Orders         Ordered    oxyCODONE-acetaminophen (PERCOCET/ROXICET) 5-325 MG tablet  Every 6 hours PRN     08/24/19 2214    predniSONE (DELTASONE) 50 MG tablet     08/24/19 2215              This chart was dictated using voice recognition software/Dragon. Despite best efforts to proofread, errors can occur which can change the meaning. Any change was purely unintentional.     Lannie Fields, PA-C 08/24/19 2243    Duffy Bruce, MD 08/26/19 2040

## 2019-08-24 NOTE — ED Notes (Signed)
See triage note  Presents with lower back pain   States pain has been there but getting worse  Is able to ambulate  Denies any recent injury

## 2019-09-07 DIAGNOSIS — G44229 Chronic tension-type headache, not intractable: Secondary | ICD-10-CM | POA: Diagnosis not present

## 2019-09-09 ENCOUNTER — Other Ambulatory Visit: Payer: Self-pay

## 2019-09-09 ENCOUNTER — Ambulatory Visit
Payer: Medicare Other | Attending: Student in an Organized Health Care Education/Training Program | Admitting: Student in an Organized Health Care Education/Training Program

## 2019-09-09 ENCOUNTER — Encounter: Payer: Self-pay | Admitting: Student in an Organized Health Care Education/Training Program

## 2019-09-09 VITALS — BP 168/108 | HR 105 | Temp 98.1°F | Resp 18 | Ht 59.0 in | Wt 205.0 lb

## 2019-09-09 DIAGNOSIS — M47817 Spondylosis without myelopathy or radiculopathy, lumbosacral region: Secondary | ICD-10-CM | POA: Diagnosis not present

## 2019-09-09 DIAGNOSIS — M25551 Pain in right hip: Secondary | ICD-10-CM | POA: Diagnosis not present

## 2019-09-09 DIAGNOSIS — M545 Low back pain, unspecified: Secondary | ICD-10-CM

## 2019-09-09 DIAGNOSIS — M25552 Pain in left hip: Secondary | ICD-10-CM

## 2019-09-09 DIAGNOSIS — Z96652 Presence of left artificial knee joint: Secondary | ICD-10-CM | POA: Diagnosis not present

## 2019-09-09 DIAGNOSIS — G950 Syringomyelia and syringobulbia: Secondary | ICD-10-CM | POA: Diagnosis not present

## 2019-09-09 DIAGNOSIS — M170 Bilateral primary osteoarthritis of knee: Secondary | ICD-10-CM

## 2019-09-09 DIAGNOSIS — G8929 Other chronic pain: Secondary | ICD-10-CM | POA: Insufficient documentation

## 2019-09-09 DIAGNOSIS — G894 Chronic pain syndrome: Secondary | ICD-10-CM | POA: Diagnosis not present

## 2019-09-09 DIAGNOSIS — M4714 Other spondylosis with myelopathy, thoracic region: Secondary | ICD-10-CM | POA: Diagnosis not present

## 2019-09-09 DIAGNOSIS — M797 Fibromyalgia: Secondary | ICD-10-CM | POA: Diagnosis not present

## 2019-09-09 MED ORDER — DICLOFENAC EPOLAMINE 1.3 % EX PTCH: 1.0000 | MEDICATED_PATCH | Freq: Two times a day (BID) | CUTANEOUS | 1 refills | Status: DC | PRN

## 2019-09-09 MED ORDER — NONFORMULARY OR COMPOUNDED ITEM: 2 refills | Status: AC

## 2019-09-09 NOTE — Progress Notes (Signed)
PROVIDER NOTE: Information contained herein reflects review and annotations entered in association with encounter. Interpretation of such information and data should be left to medically-trained personnel. Information provided to patient can be located elsewhere in the medical record under "Patient Instructions". Document created using STT-dictation technology, any transcriptional errors that may result from process are unintentional.    Patient: Kristin Porter  Service Category: E/M  Provider: Gillis Santa, MD  DOB: Dec 27, 1960  DOS: 09/09/2019  Referring Provider: Harrel Carina*  MRN: [id number]  Setting: Ambulatory outpatient  PCP: Verl Bangs, FNP  Type: Established Patient  Specialty: Interventional Pain Management    Location: Office  Delivery: Face-to-face     Primary Reason(s) for Visit: Encounter for prescription drug management. (Level of risk: moderate)  CC: Back Pain (low, mid and upper) and Leg Pain (bilateral to feet)  HPI  Kristin Porter is a 59 y.o. year old, female patient, who comes today for a medication management evaluation. She has Chronic pain of both hips; Depression; Fibromyalgia; GERD (gastroesophageal reflux disease); History of total knee arthroplasty, left; Low back pain; Lumbosacral spondylosis without myelopathy; Multiple joint pain; OA (osteoarthritis) of knee; Syrinx of spinal cord ([place]); Thoracic myelopathy; Hyperlipidemia; Hypertension; Insomnia; Numbness in feet; Prediabetes; Gait instability; Dysesthesia; BMI 40.0-44.9, adult ([place]); Hot flashes; Other bilateral secondary osteoarthritis of hip; Encounter to establish care with new doctor; and Chronic pain syndrome on their problem list. Her primarily concern today is the Back Pain (low, mid and upper) and Leg Pain (bilateral to feet)  Pain Assessment: Location: Mid, Upper, Lower Back Radiating: bilateral legs Onset: More than a month ago Duration: Chronic pain Quality: Aching, Constant, Throbbing,  Burning Severity: 10-Worst pain ever/10 (subjective, self-reported pain score)  Note: Reported level is compatible with observation.                         When using our objective Pain Scale, levels between 6 and 10/10 are said to belong in an emergency room, as it progressively worsens from a 6/10, described as severely limiting, requiring emergency care not usually available at an outpatient pain management facility. At a 6/10 level, communication becomes difficult and requires great effort. Assistance to reach the emergency department may be required. Facial flushing and profuse sweating along with potentially dangerous increases in heart rate and blood pressure will be evident. Effect on ADL:  Limits her ability to stand up for extended period of time. Timing: Constant Modifying factors: rest BP: (!) 168/108  HR: (!) 105  Ms. Graley was last scheduled for an appointment on Visit date not found for medication management. During today's appointment we reviewed [name]'s chronic pain status, as well as her outpatient medication regimen.  Patient states that she has been having increased mid upper and lower back pain.  She describes these as myofascial cramps.  She does have a history of fibromyalgia which she thinks is flaring up.  She has tried gabapentin in the past which caused side effects of GI upset.  She is currently on Lyrica.  She has tried and failed Cymbalta in the past.  Recently, her neurologist started her on amitriptyline at 10 mg which has been increased to 25 mg.  She states that she is not noticing a significant difference since starting this.  She is also having difficulty with sleeping.  She states that her pain is worse in the winter and spring months.  Of note, patient does have a thoracic syrinx that  is being monitored by Dr. Cari Caraway with neurosurgery.  Laboratory Chemistry Profile   Renal Lab Results  Component Value Date   BUN 19 12/23/2018   CREATININE 0.69  12/23/2018   GFRAA >60 12/23/2018   GFRNONAA >60 12/23/2018   PROTEINUR NEGATIVE 03/09/2018     Electrolytes Lab Results  Component Value Date   NA 140 12/23/2018   K 4.4 12/23/2018   CL 106 12/23/2018   CALCIUM 9.2 12/23/2018     Hepatic No results found for: AST, ALT, ALBUMIN, ALKPHOS, AMYLASE, LIPASE, AMMONIA   ID Lab Results  Component Value Date   PREGTESTUR NEGATIVE 03/09/2018     Bone No results found for: [place], [id number], BP1025EN2, [id number], 25OHVITD1, 25OHVITD2, 25OHVITD3, TESTOFREE, TESTOSTERONE   Endocrine Lab Results  Component Value Date   GLUCOSE 125 (H) 12/23/2018   GLUCOSEU NEGATIVE 03/09/2018     Neuropathy No results found for: VITAMINB12, FOLATE, HGBA1C, HIV   CNS No results found for: COLORCSF, APPEARCSF, RBCCOUNTCSF, WBCCSF, POLYSCSF, LYMPHSCSF, EOSCSF, PROTEINCSF, GLUCCSF, JCVIRUS, CSFOLI, IGGCSF, LABACHR, ACETBL, LABACHR, ACETBL   Inflammation (CRP: Acute  ESR: Chronic) No results found for: CRP, ESRSEDRATE, LATICACIDVEN   Rheumatology No results found for: RF, ANA, LABURIC, URICUR, LYMEIGGIGMAB, LYMEABIGMQN, HLAB27   Coagulation Lab Results  Component Value Date   PLT 278 12/23/2018     Cardiovascular Lab Results  Component Value Date   HGB 13.1 12/23/2018   HCT 39.4 12/23/2018     Screening Lab Results  Component Value Date   PREGTESTUR NEGATIVE 03/09/2018     Cancer No results found for: CEA, CA125, LABCA2   Allergens No results found for: ALMOND, APPLE, ASPARAGUS, AVOCADO, BANANA, BARLEY, BASIL, BAYLEAF, GREENBEAN, LIMABEAN, WHITEBEAN, BEEFIGE, REDBEET, BLUEBERRY, BROCCOLI, CABBAGE, MELON, CARROT, CASEIN, CASHEWNUT, CAULIFLOWER, CELERY     Note: Lab results reviewed.   Recent Diagnostic Imaging Results  DG Chest Portable 1 View CLINICAL DATA:  Chest pain  EXAM: PORTABLE CHEST 1 VIEW  COMPARISON:  None.  FINDINGS: There is slight atelectasis in the left mid lung. Lungs elsewhere are clear. Heart size and  pulmonary vascularity are normal. No adenopathy. There is slight aortic atherosclerosis. No bone lesions.  IMPRESSION: Slight left midlung atelectasis. No edema or consolidation. Heart size normal. Aortic Atherosclerosis (ICD10-I70.0).  Electronically Signed   By: Lowella Grip III M.D.   On: 12/23/2018 11:46  Complexity Note: Imaging results reviewed. Results shared with Ms. Olazabal, using Layman's terms.                               Meds   Current Outpatient Medications:  .  albuterol (PROVENTIL) (2.5 MG/3ML) 0.083% nebulizer solution, Take 2.5 mg by nebulization every 6 (six) hours as needed for wheezing or shortness of breath., Disp: , Rfl:  .  amitriptyline (ELAVIL) 10 MG tablet, Take by mouth., Disp: , Rfl:  .  amLODipine (NORVASC) 10 MG tablet, Take by mouth., Disp: , Rfl:  .  atorvastatin (LIPITOR) 80 MG tablet, Take 80 mg by mouth daily., Disp: , Rfl:  .  baclofen (LIORESAL) 10 MG tablet, Take 1 tablet (10 mg total) by mouth 4 (four) times daily as needed for muscle spasms., Disp: 120 each, Rfl: 5 .  Blood Pressure Monitoring (BLOOD PRESSURE KIT) DEVI, 1 Units by Does not apply route in the morning and at bedtime., Disp: 1 each, Rfl: 0 .  fluticasone (FLONASE) 50 MCG/ACT nasal spray, Place 1 spray into both nostrils 2 (  two) times daily., Disp: , Rfl:  .  meloxicam (MOBIC) 15 MG tablet, Take by mouth., Disp: , Rfl:  .  NARCAN 4 MG/0.1ML LIQD nasal spray kit, CALL 911. ADMINISTER A SINGLE SPRAY OF NARCAN IN ONE NOSTRIL. REPEAT EVERY 3 MINUTES AS NEEDED IF NO OR MINIMAL RESPONSE., Disp: , Rfl:  .  omeprazole (PRILOSEC) 40 MG capsule, Take by mouth., Disp: , Rfl:  .  PARoxetine (PAXIL) 10 MG tablet, Take 10 mg by mouth daily., Disp: , Rfl:  .  pregabalin (LYRICA) 75 MG capsule, 75 mg daily, 150 mg qhs, Disp: 90 capsule, Rfl: 5 .  traMADol (ULTRAM) 50 MG tablet, Take by mouth., Disp: , Rfl:  .  traZODone (DESYREL) 50 MG tablet, Take 50 mg by mouth at bedtime., Disp: , Rfl:  .   diclofenac (FLECTOR) 1.3 % PTCH, Place 1 patch onto the skin 2 (two) times daily as needed (back pain)., Disp: 60 patch, Rfl: 1 .  losartan (COZAAR) 50 MG tablet, Take by mouth., Disp: , Rfl:  .  NONFORMULARY OR COMPOUNDED ITEM, Sig: Apply 1-2 gm(s) (2-4 pumps) to affected area, 3-4 times/day. (1 pump = 0.5 gm), Disp: 1 each, Rfl: 2  ROS  Constitutional: Denies any fever or chills Gastrointestinal: No reported hemesis, hematochezia, vomiting, or acute GI distress Musculoskeletal: Denies any acute onset joint swelling, redness, loss of ROM, or weakness Neurological: No reported episodes of acute onset apraxia, aphasia, dysarthria, agnosia, amnesia, paralysis, loss of coordination, or loss of consciousness  Allergies  Ms. Lucier is allergic to tizanidine; lisinopril; and tramadol.  PFSH  Drug: Ms. Angelini  reports no history of drug use. Alcohol:  reports previous alcohol use. Tobacco:  reports that she has quit smoking. She has never used smokeless tobacco. Medical:  has a past medical history of Arthritis, Fibromyalgia, Hypertension, and Neuropathy. Surgical: Ms. Oatley  has no past surgical history on file. Family: family history is not on file.  Constitutional Exam  General appearance: Well nourished, well developed, and well hydrated. In no apparent acute distress Vitals:   09/09/19 1246  BP: (!) 168/108  Pulse: (!) 105  Resp: 18  Temp: 98.1 F (36.7 C)  TempSrc: Temporal  SpO2: 98%  Weight: 205 lb (93 kg)  Height: '4\' 11"'$  (1.499 m)   BMI Assessment: Estimated body mass index is 41.4 kg/m as calculated from the following:   Height as of this encounter: '4\' 11"'$  (1.499 m).   Weight as of this encounter: 205 lb (93 kg).  BMI interpretation table: BMI level Category Range association with higher incidence of chronic pain  <18 kg/m2 Underweight   18.5-24.9 kg/m2 Ideal body weight   25-29.9 kg/m2 Overweight Increased incidence by 20%  30-34.9 kg/m2 Obese (Class I) Increased  incidence by 68%  35-39.9 kg/m2 Severe obesity (Class II) Increased incidence by 136%  >40 kg/m2 Extreme obesity (Class III) Increased incidence by 254%   Patient's current BMI Ideal Body weight  Body mass index is 41.4 kg/m. Patient must be at least 60 in tall to calculate ideal body weight   BMI Readings from Last 4 Encounters:  09/09/19 41.40 kg/m  08/24/19 41.00 kg/m  08/20/19 42.58 kg/m  12/23/18 43.02 kg/m   Wt Readings from Last 4 Encounters:  09/09/19 205 lb (93 kg)  08/24/19 203 lb (92.1 kg)  08/20/19 210 lb 12.8 oz (95.6 kg)  12/23/18 213 lb (96.6 kg)    Psych/Mental status: Alert, oriented x 3 (person, place, & time)  Eyes: PERLA Respiratory: No evidence of acute respiratory distress  Cervical Spine Exam  Skin & Axial Inspection: No masses, redness, edema, swelling, or associated skin lesions Alignment: Symmetrical Functional ROM: Unrestricted ROM      Stability: No instability detected Muscle Tone/Strength: Functionally intact. No obvious neuro-muscular anomalies detected. Sensory (Neurological): Musculoskeletal pain pattern Palpation: No palpable anomalies              Upper Extremity (UE) Exam    Side: Right upper extremity  Side: Left upper extremity  Skin & Extremity Inspection: Skin color, temperature, and hair growth are WNL. No peripheral edema or cyanosis. No masses, redness, swelling, asymmetry, or associated skin lesions. No contractures.  Skin & Extremity Inspection: Skin color, temperature, and hair growth are WNL. No peripheral edema or cyanosis. No masses, redness, swelling, asymmetry, or associated skin lesions. No contractures.  Functional ROM: Unrestricted ROM          Functional ROM: Unrestricted ROM          Muscle Tone/Strength: Functionally intact. No obvious neuro-muscular anomalies detected.  Muscle Tone/Strength: Functionally intact. No obvious neuro-muscular anomalies detected.  Sensory (Neurological): Unimpaired          Sensory  (Neurological): Unimpaired          Palpation: No palpable anomalies              Palpation: No palpable anomalies              Provocative Test(s):  Phalen's test: deferred Tinel's test: deferred Apley's scratch test (touch opposite shoulder):  Action 1 (Across chest): deferred Action 2 (Overhead): deferred Action 3 (LB reach): deferred   Provocative Test(s):  Phalen's test: deferred Tinel's test: deferred Apley's scratch test (touch opposite shoulder):  Action 1 (Across chest): deferred Action 2 (Overhead): deferred Action 3 (LB reach): deferred    Thoracic Spine Area Exam  Skin & Axial Inspection: No masses, redness, or swelling Alignment: Symmetrical Functional ROM: Pain restricted ROM Stability: No instability detected Muscle Tone/Strength: Functionally intact. No obvious neuro-muscular anomalies detected. Sensory (Neurological): Musculoskeletal pain pattern Muscle strength & Tone: No palpable anomalies  Lumbar Exam  Skin & Axial Inspection: No masses, redness, or swelling Alignment: Symmetrical Functional ROM: Pain restricted ROM       Stability: No instability detected Muscle Tone/Strength: Functionally intact. No obvious neuro-muscular anomalies detected. Sensory (Neurological): Musculoskeletal pain pattern   Gait & Posture Assessment  Ambulation: Limited Gait: Limited. Using assistive device to ambulate Posture: Difficulty standing up straight, due to pain   Lower Extremity Exam    Side: Right lower extremity  Side: Left lower extremity  Stability: No instability observed          Stability: No instability observed          Skin & Extremity Inspection: Skin color, temperature, and hair growth are WNL. No peripheral edema or cyanosis. No masses, redness, swelling, asymmetry, or associated skin lesions. No contractures.  Skin & Extremity Inspection: Skin color, temperature, and hair growth are WNL. No peripheral edema or cyanosis. No masses, redness, swelling,  asymmetry, or associated skin lesions. No contractures.  Functional ROM: Decreased ROM for hip and knee joints          Functional ROM: Pain restricted ROM for hip and knee joints          Muscle Tone/Strength: Functionally intact. No obvious neuro-muscular anomalies detected.  Muscle Tone/Strength: Functionally intact. No obvious neuro-muscular anomalies detected.  Sensory (Neurological): Unimpaired  Sensory (Neurological): Unimpaired        DTR: Patellar: deferred today Achilles: deferred today Plantar: deferred today  DTR: Patellar: deferred today Achilles: deferred today Plantar: deferred today  Palpation: No palpable anomalies  Palpation: No palpable anomalies   Assessment   Status Diagnosis  Having a Flare-up Persistent Having a Flare-up 1. Fibromyalgia   2. Thoracic myelopathy (due to syrinx, s/p excision 12/23/2017)   3. Chronic pain of both hips   4. Chronic bilateral low back pain without sciatica   5. Lumbosacral spondylosis without myelopathy   6. Primary osteoarthritis of both knees   7. Syrinx of spinal cord ([place])   8. History of total knee arthroplasty, left   9. Chronic pain syndrome      Updated Problems: Problem  Chronic Pain Syndrome   1.  UDS today.  Patient has tramadol prescription from before and I instructed her to increase to 100 mg twice daily as needed for pain flare.  Once results are in from UDS, I can take over the patient's tramadol if a higher dose is benefiting her. 2.  Flector patch as below, compounded cream to apply to mid and lower back region. 3.  Discussed how opioid therapy can amplify pain related to fibromyalgia.  We will focus on nonopioid analgesics however patient can see if higher dose of tramadol as needed is beneficial for her breakthrough pain. 4.  Continue amitriptyline at 25 mg nightly prescribed by neurology.  This should help with sleep as well.  Can consider increasing at next visit.  Patient is on Paxil also be mindful  of serotonin syndrome with tramadol, amitriptyline, Paxil. 5.  Continue Lyrica as prescribed.  Plan of Care  Pharmacotherapy (Medications Ordered): Meds ordered this encounter  Medications  . diclofenac (FLECTOR) 1.3 % PTCH    Sig: Place 1 patch onto the skin 2 (two) times daily as needed (back pain).    Dispense:  60 patch    Refill:  1  . NONFORMULARY OR COMPOUNDED ITEM    Sig: Sig: Apply 1-2 gm(s) (2-4 pumps) to affected area, 3-4 times/day. (1 pump = 0.5 gm)    Dispense:  1 each    Refill:  2    Compounded cream: 6% Gabapentin, 2.5% Lidocaine, 0.5% Meloxicam, 5% Methocarbamol, 2.5% Prilocaine. Dispense: 120 gm Pump Bottle. (Dispenser: 1 pump = 0.5 gm.)    Orders:  Orders Placed This Encounter  Procedures  . Compliance Drug Analysis, Ur    Volume: 30 ml(s). Minimum 3 ml of urine is needed. Document temperature of fresh sample. Indications: Long term (current) use of opiate analgesic (Z79.891) Test#: [id number] (Comprehensive Profile)    Lab Orders     Compliance Drug Analysis, Ur  Planned follow-up:   Return in about 4 weeks (around 10/07/2019) for Medication Management.   Recent Visits No visits were found meeting these conditions.  Showing recent visits within past 90 days and meeting all other requirements   Today's Visits Date Type Provider Dept  09/09/19 Office Visit Gillis Santa, MD [facility]  Showing today's visits and meeting all other requirements   Future Appointments Date Type Provider Dept  10/07/19 Appointment Gillis Santa, MD [facility]  Showing future appointments within next 90 days and meeting all other requirements   Primary Care Physician: Verl Bangs, FNP Location: [facility] Note by: Gillis Santa, MD Date: 09/09/2019; Time: 2:01 PM  Note: This dictation was prepared with Dragon dictation. Any transcriptional errors that may  result from this process are unintentional.

## 2019-09-09 NOTE — Progress Notes (Signed)
Safety precautions to be maintained throughout the outpatient stay will include: orient to surroundings, keep bed in low position, maintain call bell within reach at all times, provide assistance with transfer out of bed and ambulation.  

## 2019-09-10 ENCOUNTER — Encounter: Payer: Medicare Other | Admitting: Student in an Organized Health Care Education/Training Program

## 2019-09-10 DIAGNOSIS — G894 Chronic pain syndrome: Secondary | ICD-10-CM | POA: Diagnosis not present

## 2019-09-11 ENCOUNTER — Other Ambulatory Visit: Payer: Self-pay | Admitting: Family Medicine

## 2019-09-11 DIAGNOSIS — R5382 Chronic fatigue, unspecified: Secondary | ICD-10-CM

## 2019-09-11 NOTE — Progress Notes (Signed)
Updated sleep study order with correct location preference

## 2019-09-15 LAB — COMPLIANCE DRUG ANALYSIS, UR

## 2019-09-17 ENCOUNTER — Ambulatory Visit: Payer: Medicare Other | Admitting: Family Medicine

## 2019-09-17 ENCOUNTER — Other Ambulatory Visit (HOSPITAL_COMMUNITY)
Admission: RE | Admit: 2019-09-17 | Discharge: 2019-09-17 | Disposition: A | Discharge status: Home / Self Care | Payer: Medicare Other | Source: Ambulatory Visit | Attending: Family Medicine | Admitting: Family Medicine

## 2019-09-17 ENCOUNTER — Encounter: Payer: Self-pay | Admitting: Family Medicine

## 2019-09-17 ENCOUNTER — Ambulatory Visit (INDEPENDENT_AMBULATORY_CARE_PROVIDER_SITE_OTHER): Payer: Medicare Other | Admitting: Family Medicine

## 2019-09-17 ENCOUNTER — Other Ambulatory Visit: Payer: Self-pay

## 2019-09-17 VITALS — BP 138/76 | HR 90 | Temp 96.9°F | Ht 59.0 in | Wt 211.0 lb

## 2019-09-17 DIAGNOSIS — R3129 Other microscopic hematuria: Secondary | ICD-10-CM

## 2019-09-17 DIAGNOSIS — Z79899 Other long term (current) drug therapy: Secondary | ICD-10-CM | POA: Insufficient documentation

## 2019-09-17 DIAGNOSIS — Z114 Encounter for screening for human immunodeficiency virus [HIV]: Secondary | ICD-10-CM

## 2019-09-17 DIAGNOSIS — Z13820 Encounter for screening for osteoporosis: Secondary | ICD-10-CM

## 2019-09-17 DIAGNOSIS — Z124 Encounter for screening for malignant neoplasm of cervix: Secondary | ICD-10-CM

## 2019-09-17 DIAGNOSIS — Z0000 Encounter for general adult medical examination without abnormal findings: Secondary | ICD-10-CM | POA: Diagnosis not present

## 2019-09-17 DIAGNOSIS — Z1159 Encounter for screening for other viral diseases: Secondary | ICD-10-CM | POA: Diagnosis not present

## 2019-09-17 DIAGNOSIS — R7309 Other abnormal glucose: Secondary | ICD-10-CM | POA: Diagnosis not present

## 2019-09-17 LAB — POCT GLYCOSYLATED HEMOGLOBIN (HGB A1C): Hemoglobin A1C: 5.7 % — AB (ref 4.0–5.6)

## 2019-09-17 LAB — POCT URINALYSIS DIPSTICK
Bilirubin, UA: NEGATIVE
Glucose, UA: NEGATIVE
Ketones, UA: NEGATIVE
Nitrite, UA: NEGATIVE
Protein, UA: NEGATIVE
Spec Grav, UA: 1.01 (ref 1.010–1.025)
Urobilinogen, UA: 0.2 E.U./dL
pH, UA: 5 (ref 5.0–8.0)

## 2019-09-17 NOTE — Patient Instructions (Signed)
As we discussed, have your labs drawn in the next 1-2 weeks and we will contact you with the results.  We have sent your PAP to the lab for testing.  Will contact you once the results are received.  For DEXA Scan (Bone mineral density) screening for osteoporosis  Call the [facility] below anytime to schedule your own appointment now that order has been placed.  [facility] [place], [place] [address] Phone: [phone]  [facility] [address] [place], [place] [address] Phone: [phone]  Well Visit, Ages 101 to 42: Care Instructions Overview  Well visits can help you stay healthy. Your provider has checked your overall health and may have suggested ways to take good care of yourself. Your provider also may have recommended tests. At home, you can help prevent illness with healthy eating, regular exercise, and other steps.  Follow-up care is a key part of your treatment and safety. Be sure to make and go to all appointments, and call your provider if you are having problems. It's also a good idea to know your test results and keep a list of the medicines you take.  How can you care for yourself at home?   Get screening tests that you and your doctor decide on. Screening helps find diseases before any symptoms appear.   Eat healthy foods. Choose fruits, vegetables, whole grains, protein, and low-fat dairy foods. Limit fat, especially saturated fat. Reduce salt in your diet.   Limit alcohol. If you are a man, have no more than 2 drinks a day or 14 drinks a week. If you are a woman, have no more than 1 drink a day or 7 drinks a week.   Get at least 30 minutes of physical activity on most days of the week.  We recommend you go no more than 2 days in a row without exercise. Walking is a good choice. You also may want to do other activities, such as running, swimming, cycling, or playing tennis or  team sports. Discuss any changes in your exercise program with your provider.   Reach and stay at a healthy weight. This will lower your risk for many problems, such as obesity, diabetes, heart disease, and high blood pressure.   Do not smoke or allow others to smoke around you. If you need help quitting, talk to your provider about stop-smoking programs and medicines. These can increase your chances of quitting for good.  Can call 1-800-QUIT-NOW 780 872 6101) for the [facility], assistance with smoking cessation.   Care for your mental health. It is easy to get weighed down by worry and stress. Learn strategies to manage stress, like deep breathing and mindfulness, and stay connected with your family and community. If you find you often feel sad or hopeless, talk with your provider. Treatment can help.   Talk to your provider about whether you have any risk factors for sexually transmitted infections (STIs). You can help prevent STIs if you wait to have sex with a new partner (or partners) until you've each been tested for STIs. It also helps if you use condoms (female or female condoms) and if you limit your sex partners to one person who only has sex with you. Vaccines are available for some STIs, such as HPV (these are age dependent).   Use birth control if it's important to you to prevent pregnancy. Talk with your provider about the choices available and what  might be best for you.   If you think you may have a problem with alcohol or drug use, talk to your provider. This includes prescription medicines (such as amphetamines and opioids) and illegal drugs (such as cocaine and methamphetamine). Your provider can help you figure out what type of treatment is best for you.   If you have concerns about domestic violence or intimate partner violence, there are resources available to you. National Domestic Abuse Hotline 567-435-6976   Protect your skin from too much sun. When  you're outdoors from 10 a.m. to 4 p.m., stay in the shade or cover up with clothing and a hat with a wide brim. Wear sunglasses that block UV rays. Even when it's cloudy, put broad-spectrum sunscreen (SPF 30 or higher) on any exposed skin.   See a dentist one or two times a year for checkups and to have your teeth cleaned.   See an eye doctor once per year for an eye exam.   Wear a seat belt in the car.  When should you call for help?  Watch closely for changes in your health, and be sure to contact your provider if you have any problems or symptoms that concern you.  We will plan to see you back in 3 months for hypertension and prediabetes follow up  You will receive a survey after today's visit either digitally by e-mail or paper by [place] mail. Your experiences and feedback matter to [place].  Please respond so we know how we are doing as we provide care for you.  Call us with any questions/concerns/needs.  It is my goal to be available to you for your health concerns.  Thanks for choosing me to be a partner in your healthcare needs!  Harlin Rain, FNP-C Family Nurse Practitioner [facility] Phone: [phone]

## 2019-09-17 NOTE — Assessment & Plan Note (Signed)
Annual physical exam without new findings.  Well adult with no acute concerns.  Plan: 1. Obtain health maintenance screenings as above according to age. - Increase physical activity to 30 minutes most days of the week.  - Eat healthy diet high in vegetables and fruits; low in refined carbohydrates. - Screening labs and tests as ordered - PAP testing completed today - Labs ordered - DEXA ordered 2. Return 1 year for annual physical.

## 2019-09-17 NOTE — Progress Notes (Addendum)
Subjective:    Patient ID: Kristin Porter, female    DOB: 05-23-1960, 59 y.o.   MRN: [id number]  Kristin Porter is a 59 y.o. female presenting on 09/17/2019 for Annual Exam   HPI  Ms. Tarlton presents to clinic for annual exam today without acute concerns.  HEALTH MAINTENANCE:  Weight/BMI: 1lb weight loss since last visit on 08/20/2019 Physical activity: No structured exercise Diet: Regular Seatbelt: Yes Mammogram: 01/02/2019 PAP: Completed today DEXA: Ordered Colon cancer screening: 03/10/2019 with [facility] HIV & Hep C Screening: Ordered today Optometry: Once a year Dentistry: Every 6 months  IMMUNIZATIONS: Influenza: Due next season Tetanus: Had 04/24/2018 COVID: Undecided  Depression screen [facility] 2/9 09/09/2019 08/20/2019  Decreased Interest 0 1  Down, Depressed, Hopeless 0 1  PHQ - 2 Score 0 2  Altered sleeping - 3  Tired, decreased energy - 1  Change in appetite - 1  Feeling bad or failure about yourself  - 0  Trouble concentrating - 0  Moving slowly or fidgety/restless - 0  Suicidal thoughts - 0  PHQ-9 Score - 7  Difficult doing work/chores - Somewhat difficult    Past Medical History:  Diagnosis Date  . Arthritis    Bilateral hips  . Fibromyalgia   . Hypertension   . Neuropathy    History reviewed. No pertinent surgical history. Social History   Socioeconomic History  . Marital status: Divorced    Spouse name: Not on file  . Number of children: Not on file  . Years of education: Not on file  . Highest education level: Not on file  Occupational History  . Not on file  Tobacco Use  . Smoking status: Former Research scientist (life sciences)  . Smokeless tobacco: Never Used  Substance and Sexual Activity  . Alcohol use: Not Currently  . Drug use: Never  . Sexual activity: Not on file  Other Topics Concern  . Not on file  Social History Narrative  . Not on file   Social Determinants of Health   Financial Resource Strain:   . Difficulty of Paying Living Expenses:    Food Insecurity:   . Worried About Charity fundraiser in the Last Year:   . Arboriculturist in the Last Year:   Transportation Needs:   . Film/video editor (Medical):   Marland Kitchen Lack of Transportation (Non-Medical):   Physical Activity:   . Days of Exercise per Week:   . Minutes of Exercise per Session:   Stress:   . Feeling of Stress :   Social Connections:   . Frequency of Communication with Friends and Family:   . Frequency of Social Gatherings with Friends and Family:   . Attends Religious Services:   . Active Member of Clubs or Organizations:   . Attends Archivist Meetings:   Marland Kitchen Marital Status:   Intimate Partner Violence:   . Fear of Current or Ex-Partner:   . Emotionally Abused:   Marland Kitchen Physically Abused:   . Sexually Abused:    History reviewed. No pertinent family history. Current Outpatient Medications on File Prior to Visit  Medication Sig  . albuterol (PROVENTIL) (2.5 MG/3ML) 0.083% nebulizer solution Take 2.5 mg by nebulization every 6 (six) hours as needed for wheezing or shortness of breath.  Marland Kitchen amitriptyline (ELAVIL) 10 MG tablet Take 25 mg by mouth at bedtime.   Marland Kitchen amLODipine (NORVASC) 10 MG tablet Take 10 mg by mouth daily.   Marland Kitchen atorvastatin (LIPITOR) 80 MG tablet Take 80  mg by mouth daily.  . baclofen (LIORESAL) 10 MG tablet Take 1 tablet (10 mg total) by mouth 4 (four) times daily as needed for muscle spasms.  . Blood Pressure Monitoring (BLOOD PRESSURE KIT) DEVI 1 Units by Does not apply route in the morning and at bedtime.  . fluticasone (FLONASE) 50 MCG/ACT nasal spray Place 1 spray into both nostrils 2 (two) times daily.  . meloxicam (MOBIC) 15 MG tablet Take 15 mg by mouth daily.   . NONFORMULARY OR COMPOUNDED ITEM Sig: Apply 1-2 gm(s) (2-4 pumps) to affected area, 3-4 times/day. (1 pump = 0.5 gm)  . omeprazole (PRILOSEC) 40 MG capsule Take by mouth daily as needed.   Marland Kitchen PARoxetine (PAXIL) 10 MG tablet Take 10 mg by mouth daily.  . pregabalin (LYRICA)  75 MG capsule 75 mg daily, 150 mg qhs  . traMADol (ULTRAM) 50 MG tablet Take 50 mg by mouth.   . traZODone (DESYREL) 50 MG tablet Take 50 mg by mouth at bedtime.  . diclofenac (FLECTOR) 1.3 % PTCH Place 1 patch onto the skin 2 (two) times daily as needed (back pain). (Patient not taking: Reported on 09/17/2019)  . losartan (COZAAR) 50 MG tablet 50 mg daily.   Marland Kitchen NARCAN 4 MG/0.1ML LIQD nasal spray kit CALL 911. ADMINISTER A SINGLE SPRAY OF NARCAN IN ONE NOSTRIL. REPEAT EVERY 3 MINUTES AS NEEDED IF NO OR MINIMAL RESPONSE.   No current facility-administered medications on file prior to visit.    Per HPI unless specifically indicated above     Objective:    BP 138/76 (BP Location: Left Arm, Patient Position: Sitting, Cuff Size: Large)   Pulse 90   Temp (!) 96.9 F (36.1 C) (Oral)   Ht 4' 11" (1.499 m)   Wt 211 lb (95.7 kg)   BMI 42.62 kg/m   Wt Readings from Last 3 Encounters:  09/17/19 211 lb (95.7 kg)  09/09/19 205 lb (93 kg)  08/24/19 203 lb (92.1 kg)   Physical Exam Vitals reviewed.  Constitutional:      General: She is not in acute distress.    Appearance: Normal appearance. She is well-developed and well-groomed. She is obese. She is not ill-appearing or toxic-appearing.  HENT:     Head: Normocephalic.     Nose:     Comments: Facemask in place covering mouth and nose Eyes:     General: Lids are normal. Vision grossly intact.        Right eye: No discharge.        Left eye: No discharge.     Extraocular Movements: Extraocular movements intact.     Conjunctiva/sclera: Conjunctivae normal.     Pupils: Pupils are equal, round, and reactive to light.  Cardiovascular:     Rate and Rhythm: Normal rate and regular rhythm.     Pulses: Normal pulses.          Dorsalis pedis pulses are 2+ on the right side and 2+ on the left side.       Posterior tibial pulses are 2+ on the right side and 2+ on the left side.     Heart sounds: Normal heart sounds. No murmur. No friction rub. No  gallop.   Pulmonary:     Effort: Pulmonary effort is normal. No respiratory distress.     Breath sounds: Normal breath sounds.  Abdominal:     General: Abdomen is flat. Bowel sounds are normal. There is no distension.     Palpations: Abdomen is soft.  There is no mass.     Tenderness: There is no abdominal tenderness. There is no guarding or rebound.     Hernia: No hernia is present. There is no hernia in the left inguinal area or right inguinal area.  Genitourinary:    General: Normal vulva.     Exam position: Lithotomy position.     Pubic Area: No rash or pubic lice.      Labia:        Right: No rash, tenderness, lesion or injury.        Left: No rash, tenderness, lesion or injury.      Urethra: No urethral pain, urethral swelling or urethral lesion.     Vagina: Normal. No vaginal discharge.     Cervix: Normal.     Uterus: Normal.      Adnexa: Right adnexa normal and left adnexa normal.  Musculoskeletal:        General: No tenderness. Normal range of motion.     Cervical back: Neck supple. No tenderness.     Right lower leg: No edema.     Left lower leg: No edema.  Feet:     Right foot:     Skin integrity: Skin integrity normal.     Left foot:     Skin integrity: Skin integrity normal.  Lymphadenopathy:     Cervical: No cervical adenopathy.     Lower Body: No right inguinal adenopathy. No left inguinal adenopathy.  Skin:    General: Skin is warm and dry.     Capillary Refill: Capillary refill takes less than 2 seconds.  Neurological:     General: No focal deficit present.     Mental Status: She is alert and oriented to person, place, and time.     Cranial Nerves: No cranial nerve deficit.     Sensory: No sensory deficit.     Motor: No weakness.     Coordination: Coordination normal.     Comments: Walking with cane for ambulation  Psychiatric:        Attention and Perception: Attention and perception normal.        Mood and Affect: Mood and affect normal.         Speech: Speech normal.        Behavior: Behavior normal. Behavior is cooperative.        Thought Content: Thought content normal.        Cognition and Memory: Cognition and memory normal.        Judgment: Judgment normal.     Results for orders placed or performed in visit on 09/17/19  HM MAMMOGRAPHY  Result Value Ref Range   HM Mammogram 0-4 Bi-Rad 0-4 Bi-Rad, Self Reported Normal  POCT Urinalysis Dipstick  Result Value Ref Range   Color, UA Yellow    Clarity, UA clear    Glucose, UA Negative Negative   Bilirubin, UA negative    Ketones, UA negative    Spec Grav, UA 1.010 1.010 - 1.025   Blood, UA trace    pH, UA 5.0 5.0 - 8.0   Protein, UA Negative Negative   Urobilinogen, UA 0.2 0.2 or 1.0 E.U./dL   Nitrite, UA negative    Leukocytes, UA Trace (A) Negative   Appearance     Odor    POCT glycosylated hemoglobin (Hb A1C)  Result Value Ref Range   Hemoglobin A1C 5.7 (A) 4.0 - 5.6 %   HbA1c POC (<> result, manual entry)       HbA1c, POC (prediabetic range)     HbA1c, POC (controlled diabetic range)    HM COLONOSCOPY  Result Value Ref Range   HM Colonoscopy See Report (in chart) See Report (in chart), Patient Reported      Assessment & Plan:   Problem List Items Addressed This Visit      Other   Routine medical exam    Annual physical exam without new findings.  Well adult with no acute concerns.  Plan: 1. Obtain health maintenance screenings as above according to age. - Increase physical activity to 30 minutes most days of the week.  - Eat healthy diet high in vegetables and fruits; low in refined carbohydrates. - Screening labs and tests as ordered - PAP testing completed today - Labs ordered - DEXA ordered 2. Return 1 year for annual physical.        Other Visit Diagnoses    Elevated glucose    -  Primary   Relevant Orders   POCT Urinalysis Dipstick (Completed)   POCT glycosylated hemoglobin (Hb A1C) (Completed)   Annual physical exam       Relevant  Orders   POCT Urinalysis Dipstick (Completed)   CBC with Differential   COMPLETE METABOLIC PANEL WITH GFR   Lipid Profile   Thyroid Panel With TSH   Screening for osteoporosis       Relevant Orders   DG Bone Density   Screening for HIV (human immunodeficiency virus)       Relevant Orders   HIV antibody (with reflex)   Encounter for hepatitis C screening test for low risk patient       Relevant Orders   Hepatitis C Antibody   Screening for cervical cancer       Relevant Orders   Cytology - PAP   Microscopic hematuria       Relevant Orders   Urinalysis, microscopic only      No orders of the defined types were placed in this encounter.     Follow up plan: Return in about 3 months (around 12/17/2019) for HTN and Prediabetes F/U.  Harlin Rain, FNP-C Family Nurse Practitioner [facility] 09/17/2019, 8:54 AM

## 2019-09-18 ENCOUNTER — Other Ambulatory Visit: Payer: Self-pay | Admitting: Family Medicine

## 2019-09-18 DIAGNOSIS — E782 Mixed hyperlipidemia: Secondary | ICD-10-CM

## 2019-09-18 LAB — CBC WITH DIFFERENTIAL/PLATELET
Absolute Monocytes: 546 cells/uL (ref 200–950)
Basophils Absolute: 31 cells/uL (ref 0–200)
Basophils Relative: 0.5 %
Eosinophils Absolute: 149 cells/uL (ref 15–500)
Eosinophils Relative: 2.4 %
HCT: 44 % (ref 35.0–45.0)
Hemoglobin: 14.2 g/dL (ref 11.7–15.5)
Lymphs Abs: 1761 cells/uL (ref 850–3900)
MCH: 28.9 pg (ref 27.0–33.0)
MCHC: 32.3 g/dL (ref 32.0–36.0)
MCV: 89.4 fL (ref 80.0–100.0)
MPV: 10.2 fL (ref 7.5–12.5)
Monocytes Relative: 8.8 %
Neutro Abs: 3714 cells/uL (ref 1500–7800)
Neutrophils Relative %: 59.9 %
Platelets: 296 10*3/uL (ref 140–400)
RBC: 4.92 10*6/uL (ref 3.80–5.10)
RDW: 13.5 % (ref 11.0–15.0)
Total Lymphocyte: 28.4 %
WBC: 6.2 10*3/uL (ref 3.8–10.8)

## 2019-09-18 LAB — COMPLETE METABOLIC PANEL WITH GFR
AG Ratio: 1.4 (calc) (ref 1.0–2.5)
ALT: 19 U/L (ref 6–29)
AST: 19 U/L (ref 10–35)
Albumin: 4.2 g/dL (ref 3.6–5.1)
Alkaline phosphatase (APISO): 60 U/L (ref 37–153)
BUN: 15 mg/dL (ref 7–25)
CO2: 26 mmol/L (ref 20–32)
Calcium: 9.6 mg/dL (ref 8.6–10.4)
Chloride: 104 mmol/L (ref 98–110)
Creat: 0.69 mg/dL (ref 0.50–0.99)
GFR, Est African American: 109 mL/min/{1.73_m2} (ref >=60)
GFR, Est Non African American: 94 mL/min/{1.73_m2} (ref >=60)
Globulin: 2.9 g/dL (calc) (ref 1.9–3.7)
Glucose, Bld: 101 mg/dL — ABNORMAL HIGH (ref 65–99)
Potassium: 4.7 mmol/L (ref 3.5–5.3)
Sodium: 140 mmol/L (ref 135–146)
Total Bilirubin: 0.4 mg/dL (ref 0.2–1.2)
Total Protein: 7.1 g/dL (ref 6.1–8.1)

## 2019-09-18 LAB — URINALYSIS, MICROSCOPIC ONLY
Bacteria, UA: NONE SEEN /HPF
Hyaline Cast: NONE SEEN /LPF
RBC / HPF: NONE SEEN /HPF (ref 0–2)
Squamous Epithelial / HPF: NONE SEEN /HPF (ref <=5)
WBC, UA: NONE SEEN /HPF (ref 0–5)

## 2019-09-18 LAB — THYROID PANEL WITH TSH
Free Thyroxine Index: 2 (ref 1.4–3.8)
T3 Uptake: 27 % (ref 22–35)
T4, Total: 7.4 ug/dL (ref 5.1–11.9)
TSH: 1.11 mIU/L (ref 0.40–4.50)

## 2019-09-18 LAB — HEPATITIS C ANTIBODY
Hepatitis C Ab: NONREACTIVE
SIGNAL TO CUT-OFF: 0.04 (ref <1.00)

## 2019-09-18 LAB — LIPID PANEL
Cholesterol: 233 mg/dL — ABNORMAL HIGH (ref <200)
HDL: 54 mg/dL (ref >=50)
LDL Cholesterol (Calc): 156 mg/dL (calc) — ABNORMAL HIGH
Non-HDL Cholesterol (Calc): 179 mg/dL (calc) — ABNORMAL HIGH (ref <130)
Total CHOL/HDL Ratio: 4.3 (calc) (ref <5.0)
Triglycerides: 111 mg/dL (ref <150)

## 2019-09-18 LAB — HIV ANTIBODY (ROUTINE TESTING W REFLEX): HIV 1&2 Ab, 4th Generation: NONREACTIVE

## 2019-09-18 MED ORDER — ATORVASTATIN CALCIUM 80 MG PO TABS: 80.0000 mg | ORAL_TABLET | Freq: Every day | ORAL | 1 refills | Status: DC

## 2019-09-22 DIAGNOSIS — G4730 Sleep apnea, unspecified: Secondary | ICD-10-CM | POA: Diagnosis not present

## 2019-09-28 ENCOUNTER — Other Ambulatory Visit: Payer: Self-pay

## 2019-09-28 ENCOUNTER — Encounter: Payer: Self-pay | Admitting: Family Medicine

## 2019-09-28 ENCOUNTER — Encounter (INDEPENDENT_AMBULATORY_CARE_PROVIDER_SITE_OTHER): Payer: Self-pay

## 2019-09-28 ENCOUNTER — Telehealth: Payer: Self-pay | Admitting: Family Medicine

## 2019-09-28 ENCOUNTER — Ambulatory Visit
Admission: RE | Admit: 2019-09-28 | Discharge: 2019-09-28 | Disposition: A | Discharge status: Home / Self Care | Payer: Medicare Other | Source: Ambulatory Visit | Attending: Family Medicine | Admitting: Family Medicine

## 2019-09-28 DIAGNOSIS — M85851 Other specified disorders of bone density and structure, right thigh: Secondary | ICD-10-CM | POA: Insufficient documentation

## 2019-09-28 DIAGNOSIS — M8580 Other specified disorders of bone density and structure, unspecified site: Secondary | ICD-10-CM | POA: Insufficient documentation

## 2019-09-28 DIAGNOSIS — Z13820 Encounter for screening for osteoporosis: Secondary | ICD-10-CM

## 2019-09-28 NOTE — Progress Notes (Signed)
Bone density shows osteopenia.  Would benefit from taking an over the counter supplement of Calcium 1200mg  and Vitamin D (should be a supplement with both together).  Needs to work on increasing weight bearing exercises (such as walking) to every day for at least 30 minutes.  We can repeat this test in 2-3 years.

## 2019-09-28 NOTE — Telephone Encounter (Signed)
[name], with [facility] is calling and having problems sending results by way of fax for the patients sleep study Fax number was verified. Please advise [phone].

## 2019-09-29 ENCOUNTER — Telehealth: Payer: Self-pay

## 2019-09-29 NOTE — Telephone Encounter (Signed)
Pt. Reports she has to have her sleep study repeated. States she was told she "needs something stronger than what I already take to help me sleep for the test." Please advise pt.

## 2019-09-30 ENCOUNTER — Other Ambulatory Visit: Payer: Self-pay | Admitting: Family Medicine

## 2019-09-30 DIAGNOSIS — F5104 Psychophysiologic insomnia: Secondary | ICD-10-CM

## 2019-09-30 MED ORDER — ZOLPIDEM TARTRATE 10 MG PO TABS: 10.0000 mg | ORAL_TABLET | Freq: Every evening | ORAL | 0 refills | Status: DC | PRN

## 2019-09-30 NOTE — Progress Notes (Signed)
PDMP reviewed.  Sleep study office is requesting repeat sleep study on 10/01/2019 over night and requesting non-benzodiazepine sleep aid for proper evaluation of sleep and OSA.  Rx for Ambien 10mg  x 1 sent to pharmacy.

## 2019-10-01 DIAGNOSIS — G4730 Sleep apnea, unspecified: Secondary | ICD-10-CM | POA: Diagnosis not present

## 2019-10-07 ENCOUNTER — Encounter: Payer: Self-pay | Admitting: Student in an Organized Health Care Education/Training Program

## 2019-10-07 ENCOUNTER — Other Ambulatory Visit: Payer: Self-pay

## 2019-10-07 ENCOUNTER — Ambulatory Visit
Payer: Medicare Other | Attending: Student in an Organized Health Care Education/Training Program | Admitting: Student in an Organized Health Care Education/Training Program

## 2019-10-07 VITALS — BP 153/87 | HR 93 | Temp 98.1°F | Ht 59.0 in | Wt 205.0 lb

## 2019-10-07 DIAGNOSIS — G8929 Other chronic pain: Secondary | ICD-10-CM

## 2019-10-07 DIAGNOSIS — M4714 Other spondylosis with myelopathy, thoracic region: Secondary | ICD-10-CM | POA: Insufficient documentation

## 2019-10-07 DIAGNOSIS — G894 Chronic pain syndrome: Secondary | ICD-10-CM | POA: Insufficient documentation

## 2019-10-07 DIAGNOSIS — M797 Fibromyalgia: Secondary | ICD-10-CM | POA: Diagnosis not present

## 2019-10-07 DIAGNOSIS — M25552 Pain in left hip: Secondary | ICD-10-CM | POA: Diagnosis not present

## 2019-10-07 DIAGNOSIS — Z96652 Presence of left artificial knee joint: Secondary | ICD-10-CM | POA: Insufficient documentation

## 2019-10-07 DIAGNOSIS — M25551 Pain in right hip: Secondary | ICD-10-CM | POA: Diagnosis not present

## 2019-10-07 DIAGNOSIS — M545 Low back pain, unspecified: Secondary | ICD-10-CM

## 2019-10-07 DIAGNOSIS — G950 Syringomyelia and syringobulbia: Secondary | ICD-10-CM

## 2019-10-07 MED ORDER — BUPRENORPHINE 5 MCG/HR TD PTWK: 1.0000 | MEDICATED_PATCH | TRANSDERMAL | 0 refills | Status: DC

## 2019-10-07 MED ORDER — PREGABALIN 75 MG PO CAPS: 150.0000 mg | ORAL_CAPSULE | Freq: Every day | ORAL | 5 refills | Status: DC

## 2019-10-07 NOTE — Progress Notes (Signed)
Safety precautions to be maintained throughout the outpatient stay will include: orient to surroundings, keep bed in low position, maintain call bell within reach at all times, provide assistance with transfer out of bed and ambulation.  

## 2019-10-07 NOTE — Progress Notes (Signed)
PROVIDER NOTE: Information contained herein reflects review and annotations entered in association with encounter. Interpretation of such information and data should be left to medically-trained personnel. Information provided to patient can be located elsewhere in the medical record under "Patient Instructions". Document created using STT-dictation technology, any transcriptional errors that may result from process are unintentional.    Patient: Kristin Porter  Service Category: E/M  Provider: Gillis Santa, MD  DOB: 04-10-1961  DOS: 10/07/2019  Referring Provider: Verl Bangs, FNP  MRN: [id number]  Setting: Ambulatory outpatient  PCP: Verl Bangs, FNP  Type: Established Patient  Specialty: Interventional Pain Management    Location: Office  Delivery: Face-to-face     Primary Reason(s) for Visit: Encounter for prescription drug management. (Level of risk: moderate)  CC: Shoulder Pain  HPI  Kristin Porter is a 59 y.o. year old, female patient, who comes today for a medication management evaluation. She has Chronic pain of both hips; Depression; Fibromyalgia; GERD (gastroesophageal reflux disease); History of total knee arthroplasty, left; Low back pain; Lumbosacral spondylosis without myelopathy; Multiple joint pain; OA (osteoarthritis) of knee; Syrinx of spinal cord ([place]); Thoracic myelopathy; Hyperlipidemia; Hypertension; Insomnia; Numbness in feet; Prediabetes; Gait instability; Dysesthesia; BMI 40.0-44.9, adult ([place]); Hot flashes; Other bilateral secondary osteoarthritis of hip; Encounter to establish care with new doctor; Chronic pain syndrome; Routine medical exam; and Osteopenia on their problem list. Her primarily concern today is the Shoulder Pain  Pain Assessment: Location: Right, Left, Lower, Mid, Other (Comment) Back(shoulder, feet and leg, eye) Radiating: Pain radiating down shouldwer down to arms and fingers, pain radiaities down both leg to feet. Onset: More than a month  ago Duration: Chronic pain Quality: Aching, Burning, Constant, Stabbing, Sharp, Shooting, Throbbing, Discomfort Severity: 9 /10 (subjective, self-reported pain score)  Note: Reported level is inconsistent with clinical observations.                         When using our objective Pain Scale, levels between 6 and 10/10 are said to belong in an emergency room, as it progressively worsens from a 6/10, described as severely limiting, requiring emergency care not usually available at an outpatient pain management facility. At a 6/10 level, communication becomes difficult and requires great effort. Assistance to reach the emergency department may be required. Facial flushing and profuse sweating along with potentially dangerous increases in heart rate and blood pressure will be evident. Effect on ADL: limits my daily activities Timing: Constant Modifying factors: sit down, heating pad and ice BP: (!) 153/87  HR: 93  Kristin Porter was last scheduled for an appointment on 09/09/2019 for medication management. During today's appointment we reviewed Kristin Porter's chronic pain status, as well as her outpatient medication regimen.  Patient follows up today for medication management.  She states that tramadol at 100 mg twice daily is not resulting in improved pain management and is in fact causing side effects of headaches.  She also states that Lyrica is causing her to be sedated and less functional during the day.  She does find benefit with baclofen for lower lumbar paraspinal muscle spasms.  We will transition to buprenorphine, Butrans patch to help with long-acting pain control.  We will also reduced Lyrica dose and see if that has any impact on her sedation.  Patient has tried and failed tramadol and oxycodone in the past.  Will benefit with a long-acting analgesic hopefully.  The patient  reports no history of drug use. Her body  mass index is 41.4 kg/m.  Further details on both, my assessment(s), as  well as the proposed treatment plan, please see below.  Controlled Substance Pharmacotherapy Assessment REMS (Risk Evaluation and Mitigation Strategy)  Analgesic: Butrans patch at 5 mcg an hour; discontinue tramadol given side effects of headache and lack of analgesic benefit.  Kristin Fischer, RN  10/07/2019  1:23 PM  Sign when Signing Visit Safety precautions to be maintained throughout the outpatient stay will include: orient to surroundings, keep bed in low position, maintain call bell within reach at all times, provide assistance with transfer out of bed and ambulation.     Monitoring:  PMP: PDMP reviewed during this encounter. Online review of the past 60-monthperiod conducted. Compliant with practice rules and regulations Last UDS on record: Summary  Date Value Ref Range Status  09/10/2019 Note  Final    Comment:    ==================================================================== Compliance Drug Analysis, Ur ==================================================================== Specimen Alert Note: Urinary creatinine is low; ability to detect some drugs may be compromised. Interpret results with caution. (Creatinine) ==================================================================== Test                             Result       Flag       Units Drug Present and Declared for Prescription Verification   Tramadol                       2744         EXPECTED   ng/mg creat   O-Desmethyltramadol            3256         EXPECTED   ng/mg creat    Source of tramadol is a prescription medication. O-desmethyltramadol    is an expected metabolite of tramadol. Drug Present not Declared for Prescription Verification   Acetaminophen                  PRESENT      UNEXPECTED   Diphenhydramine                PRESENT      UNEXPECTED Drug Absent but Declared for Prescription Verification   Pregabalin                     Not Detected UNEXPECTED   Baclofen                       Not  Detected UNEXPECTED   Amitriptyline                  Not Detected UNEXPECTED   Paroxetine                     Not Detected UNEXPECTED   Trazodone                      Not Detected UNEXPECTED   Diclofenac                     Not Detected UNEXPECTED    Topical diclofenac, as indicated in the declared medication list, is    not always detected even when used as directed. ==================================================================== Test                      Result    Flag   Units  Ref Range   Creatinine              16        LL     mg/dL      >=20 ==================================================================== Declared Medications:  The flagging and interpretation on this report are based on the  following declared medications.  Unexpected results may arise from  inaccuracies in the declared medications.  **Note: The testing scope of this panel includes these medications:  Amitriptyline  Baclofen  Paroxetine  Pregabalin  Tramadol  Trazodone  **Note: The testing scope of this panel does not include small to  moderate amounts of these reported medications:  Topical Diclofenac (Flector)  **Note: The testing scope of this panel does not include the  following reported medications:  Albuterol  Amlodipine  Atorvastatin  Fluticasone (Flonase)  Losartan  Meloxicam  Naloxone  Omeprazole  Undefined Miscellaneous Drug ==================================================================== For clinical consultation, please call [phone]. ====================================================================    UDS interpretation: Compliant          Medication Assessment Form: Reviewed. Patient indicates being compliant with therapy Treatment compliance: Compliant Risk Assessment Profile: Aberrant behavior: See initial evaluations. None observed or detected today Comorbid factors increasing risk of overdose: See initial evaluation. No additional risks detected  today Opioid risk tool (ORT):  Opioid Risk  09/09/2019  Alcohol 1  Illegal Drugs 0  Rx Drugs 0  Alcohol 0  Illegal Drugs 0  Rx Drugs 0  Age between 16-45 years  0  History of Preadolescent Sexual Abuse 0  Psychological Disease 0  Depression 0  Opioid Risk Tool Scoring 1  Opioid Risk Interpretation Low Risk    ORT Scoring interpretation table:  Score <3 = Low Risk for SUD  Score between 4-7 = Moderate Risk for SUD  Score >8 = High Risk for Opioid Abuse   Risk of substance use disorder (SUD): Low  Risk Mitigation Strategies:  Patient Counseling: Covered Patient-Prescriber Agreement (PPA): Present and active  Notification to other healthcare providers: Done  Pharmacologic Plan: Start Butrans patch as above.             Laboratory Chemistry Profile   Renal Lab Results  Component Value Date   BUN 15 09/17/2019   CREATININE 0.69 07/86/7544   BCR NOT APPLICABLE 92/05/69   GFRAA 109 09/17/2019   GFRNONAA 94 09/17/2019   SPECGRAV 1.010 09/17/2019   PHUR 5.0 09/17/2019   PROTEINUR Negative 09/17/2019     Electrolytes Lab Results  Component Value Date   NA 140 09/17/2019   K 4.7 09/17/2019   CL 104 09/17/2019   CALCIUM 9.6 09/17/2019     Hepatic Lab Results  Component Value Date   AST 19 09/17/2019   ALT 19 09/17/2019     ID Lab Results  Component Value Date   HIV NON-REACTIVE 09/17/2019   PREGTESTUR NEGATIVE 03/09/2018     Bone No results found for: VD25OH, VD125OH2TOT, QR9758IT2, PQ9826EB5, 25OHVITD1, 25OHVITD2, 25OHVITD3, TESTOFREE, TESTOSTERONE   Endocrine Lab Results  Component Value Date   GLUCOSE 101 (H) 09/17/2019   GLUCOSEU NEGATIVE 03/09/2018   HGBA1C 5.7 (A) 09/17/2019   TSH 1.11 09/17/2019     Neuropathy Lab Results  Component Value Date   HGBA1C 5.7 (A) 09/17/2019   HIV NON-REACTIVE 09/17/2019     CNS No results found for: COLORCSF, APPEARCSF, RBCCOUNTCSF, WBCCSF, POLYSCSF, LYMPHSCSF, EOSCSF, PROTEINCSF, GLUCCSF, JCVIRUS, CSFOLI,  IGGCSF, LABACHR, ACETBL, LABACHR, ACETBL   Inflammation (CRP: Acute  ESR: Chronic) No results found for:  CRP, ESRSEDRATE, LATICACIDVEN   Rheumatology No results found for: RF, ANA, LABURIC, URICUR, LYMEIGGIGMAB, LYMEABIGMQN, HLAB27   Coagulation Lab Results  Component Value Date   PLT 296 09/17/2019     Cardiovascular Lab Results  Component Value Date   HGB 14.2 09/17/2019   HCT 44.0 09/17/2019     Screening Lab Results  Component Value Date   HIV NON-REACTIVE 09/17/2019   PREGTESTUR NEGATIVE 03/09/2018     Cancer No results found for: CEA, CA125, LABCA2   Allergens No results found for: ALMOND, APPLE, ASPARAGUS, AVOCADO, BANANA, BARLEY, BASIL, BAYLEAF, GREENBEAN, LIMABEAN, WHITEBEAN, BEEFIGE, REDBEET, BLUEBERRY, BROCCOLI, CABBAGE, MELON, CARROT, CASEIN, CASHEWNUT, CAULIFLOWER, CELERY     Note: Lab results reviewed.   Recent Diagnostic Imaging Results  DG Bone Density EXAM: DUAL X-RAY ABSORPTIOMETRY (DXA) FOR BONE MINERAL DENSITY  IMPRESSION: crr  Your patient Ayeisha Liberatore completed a BMD test on 09/28/2019 using the [place] (analysis version: 14.10) manufactured by [employer]. The following summarizes the results of our evaluation.  PATIENT BIOGRAPHICAL: Name: Jhana, Giarratano Patient ID: [id number] Birth Date: Feb 12, 1961  Height:     59.0 in. Gender: Female Exam Date: 09/28/2019  Weight:     208.2 lbs. Indications: arthritis, lt knee replacement, POSTmenopausal Fractures:  Treatments: aspirin, Calcium, lyrica, multivitamin  ASSESSMENT: The BMD measured at Femur Neck Right is 0.827 g/cm2 with a T-score of -1.5.  This patient is considered OSTEOPENIC according to [facility]) criteria.  The scan quality is good.  Site Region Measured Measured WHO Young Adult BMD Date       Age      Classification T-score  AP Spine L1-L4 09/28/2019 61.3 Normal 0.2 1.201 g/cm2  DualFemur Neck Right  09/28/2019 61.3 Osteopenia -1.5 0.827 g/cm2  World Health Organization [facility]) criteria for post-menopausal, Caucasian Women: Normal:       T-score at or above -1 SD Osteopenia:   T-score between -1 and -2.5 SD Osteoporosis: T-score at or below -2.5 SD  RECOMMENDATIONS: 1. All patients should optimize calcium and vitamin D intake. 2. Consider FDA-approved medical therapies in postmenopausal women and men aged 19 years and older, based on the following: a. A hip or vertebral (clinical or morphometric) fracture b. T-score < -2.5 at the femoral neck or spine after appropriate evaluation to exclude secondary causes c. Low bone mass (T-score between -1.0 and -2.5 at the femoral neck or spine) and a 10-year probability of a hip fracture > 3% or a 10-year probability of a major osteoporosis-related fracture > 20% based on the [place]-adapted WHO algorithm d. Clinician judgment and/or patient preferences may indicate treatment for people with 10-year fracture probabilities above or below these levels  FOLLOW-UP: People with diagnosed cases of osteoporosis or at high risk for fracture should have regular bone mineral density tests. For patients eligible for Medicare, routine testing is allowed once every 2 years. The testing frequency can be increased to one year for patients who have rapidly progressing disease, those who are receiving or discontinuing medical therapy to restore bone mass, or have additional risk factors.  I have reviewed this report, and agree with the above findings.  Mark A. Thornton Papas, M.D. [facility] Radiology crr  Your patient IYANIA DENNE completed a FRAX assessment on 09/28/2019 using the [place] (analysis version: 14.10) manufactured by [employer]. The following summarizes the results of our evaluation.  PATIENT BIOGRAPHICAL: Name: Jenalyn, Girdner Patient ID: [id number] Birth Date: 01-07-1961 Height:  59.0 in. Gender:      Female    Age:        59.3       Weight:    208.2 lbs. Ethnicity:  Black                            Exam Date: 09/28/2019  FRAX* RESULTS:  (version: 3.5) 10-year Probability of Fracture1 Major Osteoporotic Fracture2 Hip Fracture 3.3% 0.3% Population: [place] (Black) Risk Factors: None  Based on Femur (Right) Neck BMD  1 -The 10-year probability of fracture may be lower than reported if the patient has received treatment. 2 -Major Osteoporotic Fracture: Clinical Spine, Forearm, Hip or Shoulder  *FRAX is a Materials engineer of the [employer] for Metabolic Bone Disease, a [place] (WHO) [facility].  ASSESSMENT: The probability of a major osteoporotic fracture is 3.3% within the next ten years. The probability of a hip fracture is 0.3% within the next ten years.  I have reviewed this report and agree with the above findings.  Mark A. Thornton Papas, M.D.  [facility]  Electronically Signed   By: Lavonia Dana M.D.   On: 09/28/2019 14:38  Complexity Note: Imaging results reviewed. Results shared with Ms. Redler, using Layman's terms.                               Meds   Current Outpatient Medications:  .  albuterol (PROVENTIL) (2.5 MG/3ML) 0.083% nebulizer solution, Take 2.5 mg by nebulization every 6 (six) hours as needed for wheezing or shortness of breath., Disp: , Rfl:  .  amitriptyline (ELAVIL) 10 MG tablet, Take 25 mg by mouth at bedtime. , Disp: , Rfl:  .  amLODipine (NORVASC) 10 MG tablet, Take 10 mg by mouth daily. , Disp: , Rfl:  .  atorvastatin (LIPITOR) 80 MG tablet, Take 1 tablet (80 mg total) by mouth daily., Disp: 90 tablet, Rfl: 1 .  baclofen (LIORESAL) 10 MG tablet, Take 1 tablet (10 mg total) by mouth 4 (four) times daily as needed for muscle spasms., Disp: 120 each, Rfl: 5 .  Blood Pressure Monitoring (BLOOD PRESSURE KIT) DEVI, 1 Units by Does not apply route in the morning and at bedtime., Disp: 1  each, Rfl: 0 .  fluticasone (FLONASE) 50 MCG/ACT nasal spray, Place 1 spray into both nostrils 2 (two) times daily., Disp: , Rfl:  .  losartan (COZAAR) 50 MG tablet, 50 mg daily. , Disp: , Rfl:  .  meloxicam (MOBIC) 15 MG tablet, Take 15 mg by mouth daily. , Disp: , Rfl:  .  NARCAN 4 MG/0.1ML LIQD nasal spray kit, CALL 911. ADMINISTER A SINGLE SPRAY OF NARCAN IN ONE NOSTRIL. REPEAT EVERY 3 MINUTES AS NEEDED IF NO OR MINIMAL RESPONSE., Disp: , Rfl:  .  NONFORMULARY OR COMPOUNDED ITEM, Sig: Apply 1-2 gm(s) (2-4 pumps) to affected area, 3-4 times/day. (1 pump = 0.5 gm), Disp: 1 each, Rfl: 2 .  omeprazole (PRILOSEC) 40 MG capsule, Take by mouth daily as needed. , Disp: , Rfl:  .  PARoxetine (PAXIL) 10 MG tablet, Take 10 mg by mouth daily., Disp: , Rfl:  .  pregabalin (LYRICA) 75 MG capsule, Take 2 capsules (150 mg total) by mouth at bedtime., Disp: 60 capsule, Rfl: 5 .  zolpidem (AMBIEN) 10 MG tablet, Take 1 tablet (10 mg total) by mouth at bedtime as  needed for sleep., Disp: 1 tablet, Rfl: 0 .  buprenorphine (BUTRANS) 5 MCG/HR PTWK, Place 1 patch onto the skin every 7 (seven) days., Disp: 4 patch, Rfl: 0 .  diclofenac (FLECTOR) 1.3 % PTCH, Place 1 patch onto the skin 2 (two) times daily as needed (back pain). (Patient not taking: Reported on 09/17/2019), Disp: 60 patch, Rfl: 1 .  traMADol (ULTRAM) 50 MG tablet, Take 50 mg by mouth. , Disp: , Rfl:  .  traZODone (DESYREL) 50 MG tablet, Take 50 mg by mouth at bedtime., Disp: , Rfl:   ROS  Constitutional: Denies any fever or chills Gastrointestinal: No reported hemesis, hematochezia, vomiting, or acute GI distress Musculoskeletal: Denies any acute onset joint swelling, redness, loss of ROM, or weakness Neurological: No reported episodes of acute onset apraxia, aphasia, dysarthria, agnosia, amnesia, paralysis, loss of coordination, or loss of consciousness  Allergies  Ms. Cherubini is allergic to tizanidine; lisinopril; and tramadol.  PFSH  Drug: Ms.  Tango  reports no history of drug use. Alcohol:  reports previous alcohol use. Tobacco:  reports that she has quit smoking. She has never used smokeless tobacco. Medical:  has a past medical history of Arthritis, Fibromyalgia, Hypertension, and Neuropathy. Surgical: Ms. Roeder  has no past surgical history on file. Family: family history is not on file.  Constitutional Exam  General appearance: Well nourished, well developed, and well hydrated. In no apparent acute distress Vitals:   10/07/19 1323  BP: (!) 153/87  Pulse: 93  Temp: 98.1 F (36.7 C)  SpO2: 98%  Weight: 205 lb (93 kg)  Height: '4\' 11"'  (1.499 m)   BMI Assessment: Estimated body mass index is 41.4 kg/m as calculated from the following:   Height as of this encounter: '4\' 11"'  (1.499 m).   Weight as of this encounter: 205 lb (93 kg).  BMI interpretation table: BMI level Category Range association with higher incidence of chronic pain  <18 kg/m2 Underweight   18.5-24.9 kg/m2 Ideal body weight   25-29.9 kg/m2 Overweight Increased incidence by 20%  30-34.9 kg/m2 Obese (Class I) Increased incidence by 68%  35-39.9 kg/m2 Severe obesity (Class II) Increased incidence by 136%  >40 kg/m2 Extreme obesity (Class III) Increased incidence by 254%   Patient's current BMI Ideal Body weight  Body mass index is 41.4 kg/m. Patient must be at least 60 in tall to calculate ideal body weight   BMI Readings from Last 4 Encounters:  10/07/19 41.40 kg/m  09/17/19 42.62 kg/m  09/09/19 41.40 kg/m  08/24/19 41.00 kg/m   Wt Readings from Last 4 Encounters:  10/07/19 205 lb (93 kg)  09/17/19 211 lb (95.7 kg)  09/09/19 205 lb (93 kg)  08/24/19 203 lb (92.1 kg)    Psych/Mental status: Alert, oriented x 3 (person, place, & time)       Eyes: PERLA Respiratory: No evidence of acute respiratory distress  Cervical Spine Exam  Skin & Axial Inspection: No masses, redness, edema, swelling, or associated skin lesions Alignment:  Symmetrical Functional ROM: Unrestricted ROM      Stability: No instability detected Muscle Tone/Strength: Functionally intact. No obvious neuro-muscular anomalies detected. Sensory (Neurological): Musculoskeletal pain pattern Palpation: No palpable anomalies              Upper Extremity (UE) Exam    Side: Right upper extremity  Side: Left upper extremity  Skin & Extremity Inspection: Skin color, temperature, and hair growth are WNL. No peripheral edema or cyanosis. No masses, redness, swelling, asymmetry, or associated  skin lesions. No contractures.  Skin & Extremity Inspection: Skin color, temperature, and hair growth are WNL. No peripheral edema or cyanosis. No masses, redness, swelling, asymmetry, or associated skin lesions. No contractures.  Functional ROM: Unrestricted ROM          Functional ROM: Unrestricted ROM          Muscle Tone/Strength: Functionally intact. No obvious neuro-muscular anomalies detected.  Muscle Tone/Strength: Functionally intact. No obvious neuro-muscular anomalies detected.  Sensory (Neurological): Musculoskeletal pain pattern          Sensory (Neurological): Musculoskeletal pain pattern          Palpation: No palpable anomalies              Palpation: No palpable anomalies              Provocative Test(s):  Phalen's test: deferred Tinel's test: deferred Apley's scratch test (touch opposite shoulder):  Action 1 (Across chest): deferred Action 2 (Overhead): deferred Action 3 (LB reach): deferred   Provocative Test(s):  Phalen's test: deferred Tinel's test: deferred Apley's scratch test (touch opposite shoulder):  Action 1 (Across chest): deferred Action 2 (Overhead): deferred Action 3 (LB reach): deferred    Thoracic Spine Area Exam  Skin & Axial Inspection: No masses, redness, or swelling Alignment: Symmetrical Functional ROM: Unrestricted ROM Stability: No instability detected Muscle Tone/Strength: Functionally intact. No obvious neuro-muscular  anomalies detected. Sensory (Neurological): Unimpaired Muscle strength & Tone: No palpable anomalies  Lumbar Exam  Skin & Axial Inspection: No masses, redness, or swelling Alignment: Symmetrical Functional ROM: Decreased ROM       Stability: No instability detected Muscle Tone/Strength: Functionally intact. No obvious neuro-muscular anomalies detected. Sensory (Neurological): Musculoskeletal pain pattern   Gait & Posture Assessment  Ambulation: Patient ambulates using a cane Gait: Limited. Using assistive device to ambulate Posture: Difficulty standing up straight, due to pain   Lower Extremity Exam    Side: Right lower extremity  Side: Left lower extremity  Stability: No instability observed          Stability: No instability observed          Skin & Extremity Inspection: Skin color, temperature, and hair growth are WNL. No peripheral edema or cyanosis. No masses, redness, swelling, asymmetry, or associated skin lesions. No contractures.  Skin & Extremity Inspection: Skin color, temperature, and hair growth are WNL. No peripheral edema or cyanosis. No masses, redness, swelling, asymmetry, or associated skin lesions. No contractures.  Functional ROM: Unrestricted ROM                  Functional ROM: Unrestricted ROM                  Muscle Tone/Strength: Functionally intact. No obvious neuro-muscular anomalies detected.  Muscle Tone/Strength: Functionally intact. No obvious neuro-muscular anomalies detected.  Sensory (Neurological): Musculoskeletal pain pattern        Sensory (Neurological): Musculoskeletal pain pattern        DTR: Patellar: deferred today Achilles: deferred today Plantar: deferred today  DTR: Patellar: deferred today Achilles: deferred today Plantar: deferred today  Palpation: No palpable anomalies  Palpation: No palpable anomalies   Assessment   Status Diagnosis  Controlled Controlled Controlled 1. Chronic pain syndrome   2. Fibromyalgia   3. Thoracic  myelopathy (due to syrinx, s/p excision 12/23/2017)   4. Chronic bilateral low back pain without sciatica   5. Syrinx of spinal cord ([place])   6. History of total knee arthroplasty, left  7. Chronic pain of both hips      1.  Discontinue tramadol, okay to utilize for breakthrough pain only.  Start Butrans patch as below.  Instructions provided. 2.  Reduce Lyrica 150 mg nightly and see if that helps out with daytime sedation. 3.  Continue baclofen as prescribed. 4.  Follow-up in 1 month.  Plan of Care  Pharmacotherapy (Medications Ordered): Meds ordered this encounter  Medications  . pregabalin (LYRICA) 75 MG capsule    Sig: Take 2 capsules (150 mg total) by mouth at bedtime.    Dispense:  60 capsule    Refill:  5  . buprenorphine (BUTRANS) 5 MCG/HR PTWK    Sig: Place 1 patch onto the skin every 7 (seven) days.    Dispense:  4 patch    Refill:  0    Do not place this medication, or any other prescription from our practice, on "Automatic Refill". Patient may have prescription filled one day early if pharmacy is closed on scheduled refill date.   Planned follow-up:   Return in about 4 weeks (around 11/04/2019) for Medication Management, in person.   Recent Visits Date Type Provider Dept  09/09/19 Office Visit Gillis Santa, MD [facility]  Showing recent visits within past 90 days and meeting all other requirements   Today's Visits Date Type Provider Dept  10/07/19 Office Visit Gillis Santa, MD [facility]  Showing today's visits and meeting all other requirements   Future Appointments Date Type Provider Dept  11/05/19 Appointment Gillis Santa, MD [facility]  Showing future appointments within next 90 days and meeting all other requirements   Primary Care Physician: Verl Bangs, FNP Location: [facility] Note by: Gillis Santa, MD Date: 10/07/2019; Time: 2:19 PM  Note: This dictation was prepared with Dragon  dictation. Any transcriptional errors that may result from this process are unintentional.

## 2019-10-07 NOTE — Patient Instructions (Signed)
Prescriptions for Buprenorphine patch and Lyrica have been sent to your pharmacy.

## 2019-10-13 ENCOUNTER — Ambulatory Visit (INDEPENDENT_AMBULATORY_CARE_PROVIDER_SITE_OTHER): Payer: Medicare Other

## 2019-10-13 VITALS — Ht 59.0 in | Wt 205.0 lb

## 2019-10-13 DIAGNOSIS — Z0000 Encounter for general adult medical examination without abnormal findings: Secondary | ICD-10-CM | POA: Diagnosis not present

## 2019-10-13 DIAGNOSIS — Z1231 Encounter for screening mammogram for malignant neoplasm of breast: Secondary | ICD-10-CM | POA: Diagnosis not present

## 2019-10-13 LAB — CYTOLOGY - PAP
Adequacy: ABSENT
Diagnosis: NEGATIVE

## 2019-10-13 NOTE — Progress Notes (Signed)
Subjective:   Kristin Porter is a 59 y.o. female who presents for an Initial Medicare Annual Wellness Visit.   I connected with Angelyne Hoke today by telephone and verified that I am speaking with the correct person using two identifiers. Location patient: home Location provider: work Persons participating in the virtual visit: patient, provider.   I discussed the limitations, risks, security and privacy concerns of performing an evaluation and management service by telephone and the availability of in person appointments. I also discussed with the patient that there may be a patient responsible charge related to this service. The patient expressed understanding and verbally consented to this telephonic visit.    Interactive audio and video telecommunications were attempted between this provider and patient, however failed, due to patient having technical difficulties OR patient did not have access to video capability.  We continued and completed visit with audio only.  Some vital signs may be absent or patient reported.   Time Spent with patient on telephone encounter: 20 minutes  Review of Systems      Cardiac Risk Factors include: advanced age (>54mn, >>75women)     Objective:    Today's Vitals   10/13/19 1441  Weight: 205 lb (93 kg)  Height: '4\' 11"'  (1.499 m)   Body mass index is 41.4 kg/m.  Advanced Directives 10/13/2019 08/24/2019 12/23/2018 03/09/2018  Does Patient Have a Medical Advance Directive? Yes No No Yes  Type of Advance Directive - - - HRoy Does patient want to make changes to medical advance directive? - - No - Patient declined -  Copy of HMelody Hillin Chart? - - - No - copy requested  Would patient like information on creating a medical advance directive? - No - Patient declined No - Patient declined -    Current Medications (verified) Outpatient Encounter Medications as of 10/13/2019  Medication Sig  .  albuterol (PROVENTIL) (2.5 MG/3ML) 0.083% nebulizer solution Take 2.5 mg by nebulization every 6 (six) hours as needed for wheezing or shortness of breath.  .Marland KitchenamLODipine (NORVASC) 10 MG tablet Take 10 mg by mouth daily.   .Marland Kitchenatorvastatin (LIPITOR) 80 MG tablet Take 1 tablet (80 mg total) by mouth daily.  . baclofen (LIORESAL) 10 MG tablet Take 1 tablet (10 mg total) by mouth 4 (four) times daily as needed for muscle spasms.  . buprenorphine (BUTRANS) 5 MCG/HR PTWK Place 1 patch onto the skin every 7 (seven) days.  . fluticasone (FLONASE) 50 MCG/ACT nasal spray Place 1 spray into both nostrils 2 (two) times daily.  .Marland Kitchenlosartan (COZAAR) 50 MG tablet 50 mg daily.   . meloxicam (MOBIC) 15 MG tablet Take 15 mg by mouth daily.   .Marland Kitchenomeprazole (PRILOSEC) 40 MG capsule Take by mouth daily as needed.   .Marland KitchenPARoxetine (PAXIL) 10 MG tablet Take 10 mg by mouth daily.  . pregabalin (LYRICA) 75 MG capsule Take 2 capsules (150 mg total) by mouth at bedtime.  . traMADol (ULTRAM) 50 MG tablet Take 50 mg by mouth.   . zolpidem (AMBIEN) 10 MG tablet Take 1 tablet (10 mg total) by mouth at bedtime as needed for sleep.  . [DISCONTINUED] amitriptyline (ELAVIL) 10 MG tablet Take 25 mg by mouth at bedtime.   .Marland Kitchenamitriptyline (ELAVIL) 25 MG tablet Take 25 mg by mouth at bedtime.  . Blood Pressure Monitoring (BLOOD PRESSURE KIT) DEVI 1 Units by Does not apply route in the morning and at bedtime. (  Patient not taking: Reported on 10/13/2019)  . NARCAN 4 MG/0.1ML LIQD nasal spray kit CALL 911. ADMINISTER A SINGLE SPRAY OF NARCAN IN ONE NOSTRIL. REPEAT EVERY 3 MINUTES AS NEEDED IF NO OR MINIMAL RESPONSE.  Marland Kitchen NONFORMULARY OR COMPOUNDED ITEM Sig: Apply 1-2 gm(s) (2-4 pumps) to affected area, 3-4 times/day. (1 pump = 0.5 gm)  . [DISCONTINUED] diclofenac (FLECTOR) 1.3 % PTCH Place 1 patch onto the skin 2 (two) times daily as needed (back pain). (Patient not taking: Reported on 09/17/2019)  . [DISCONTINUED] traZODone (DESYREL) 50 MG tablet  Take 50 mg by mouth at bedtime.   No facility-administered encounter medications on file as of 10/13/2019.    Allergies (verified) Tizanidine, Lisinopril, and Tramadol   History: Past Medical History:  Diagnosis Date  . Arthritis    Bilateral hips  . Fibromyalgia   . Hypertension   . Neuropathy    History reviewed. No pertinent surgical history. Family History  Family history unknown: Yes   Social History   Socioeconomic History  . Marital status: Divorced    Spouse name: Not on file  . Number of children: Not on file  . Years of education: Not on file  . Highest education level: Not on file  Occupational History  . Occupation: disability   Tobacco Use  . Smoking status: Former Smoker    Quit date: 1970    Years since quitting: 51.4  . Smokeless tobacco: Never Used  Substance and Sexual Activity  . Alcohol use: Not Currently  . Drug use: Never  . Sexual activity: Not on file  Other Topics Concern  . Not on file  Social History Narrative  . Not on file   Social Determinants of Health   Financial Resource Strain: Low Risk   . Difficulty of Paying Living Expenses: Not hard at all  Food Insecurity: No Food Insecurity  . Worried About Charity fundraiser in the Last Year: Never true  . Ran Out of Food in the Last Year: Never true  Transportation Needs: No Transportation Needs  . Lack of Transportation (Medical): No  . Lack of Transportation (Non-Medical): No  Physical Activity: Inactive  . Days of Exercise per Week: 0 days  . Minutes of Exercise per Session: 0 min  Stress:   . Feeling of Stress :   Social Connections: Moderately Isolated  . Frequency of Communication with Friends and Family: More than three times a week  . Frequency of Social Gatherings with Friends and Family: More than three times a week  . Attends Religious Services: Never  . Active Member of Clubs or Organizations: No  . Attends Archivist Meetings: Never  . Marital Status:  Divorced    Tobacco Counseling Counseling given: Not Answered   Clinical Intake:                        Activities of Daily Living In your present state of health, do you have any difficulty performing the following activities: 10/13/2019 08/20/2019  Hearing? N N  Comment no hearing aids -  Vision? N Y  Comment eyeglasses, Dr.Newman -  Difficulty concentrating or making decisions? N N  Walking or climbing stairs? Y Y  Comment - fibyromyalgia  Dressing or bathing? N N  Doing errands, shopping? N Y  Comment has medicaid transportation Facilities manager and eating ? N -  Using the Toilet? N -  In the past six months, have you accidently leaked  urine? N -  Do you have problems with loss of bowel control? N -  Managing your Medications? N -  Managing your Finances? N -  Housekeeping or managing your Housekeeping? N -  Some recent data might be hidden     Immunizations and Health Maintenance Immunization History  Administered Date(s) Administered  . Influenza-Unspecified 02/26/2011, 02/21/2012  . Tdap 09/04/2007, 04/24/2018   Health Maintenance Due  Topic Date Due  . COVID-19 Vaccine (1) Never done    Patient Care Team: Malfi, Lupita Raider, FNP as PCP - General (Family Medicine) Malfi, Lupita Raider, FNP (Family Medicine)  Indicate any recent Medical Services you may have received from other than [facility] providers in the past year (date may be approximate).     Assessment:   This is a routine wellness examination for Kristin Porter.  Hearing/Vision screen  Hearing Screening   '125Hz'  '250Hz'  '500Hz'  '1000Hz'  '2000Hz'  '3000Hz'  '4000Hz'  '6000Hz'  '8000Hz'   Right ear:           Left ear:           Vision Screening Comments: Dr.Newman   Dietary issues and exercise activities discussed: Current Exercise Habits: The patient does not participate in regular exercise at present, Exercise limited by: None identified  Goals Addressed   None    Depression Screen PHQ 2/9 Scores 10/13/2019  09/09/2019 08/20/2019  PHQ - 2 Score 0 0 2  PHQ- 9 Score - - 7    Fall Risk Fall Risk  10/13/2019 10/07/2019 09/09/2019  Falls in the past year? 1 0 0  Number falls in past yr: 1 - -  Comment tripped - -  Injury with Fall? 0 - -   FALL RISK PREVENTION PERTAINING TO THE HOME:  Any stairs in or around the home? Yes  If so, are there any without handrails? No    Has problem with stairs, trying to move due to stairs at her appt.   Home free of loose throw rugs in walkways, pet beds, electrical cords, etc? Yes  Adequate lighting in your home to reduce risk of falls? Yes   ASSISTIVE DEVICES UTILIZED TO PREVENT FALLS:  Life alert? No  Use of a cane, walker or w/c? Cane  Grab bars in the bathroom? No  Shower chair or bench in shower? yes Elevated toilet seat or a handicapped toilet? No    DME ORDERS:  DME order needed?  No   TIMED UP AND GO:  Unable to perform     Cognitive Function:        Screening Tests Health Maintenance  Topic Date Due  . COVID-19 Vaccine (1) Never done  . INFLUENZA VACCINE  12/20/2019  . MAMMOGRAM  01/01/2021  . PAP SMEAR-Modifier  09/17/2022  . TETANUS/TDAP  04/24/2028  . COLONOSCOPY  03/09/2029  . Hepatitis C Screening  Completed  . HIV Screening  Completed    Qualifies for Shingles Vaccine? Yes  Zostavax completed n/a. Due for Shingrix. Education has been provided regarding the importance of this vaccine. Pt has been advised to call insurance company to determine out of pocket expense. Advised may also receive vaccine at local pharmacy or Health Dept. Verbalized acceptance and understanding.  Tdap: up to date   Flu Vaccine: declined   Pneumococcal Vaccine: not indicated   Covid-19 Vaccine:  Information provided  Cancer Screenings:   Colorectal Screening: Completed 03/10/2019. Repeat every 10 years  Mammogram: Completed 01/02/2019. Ordered   Bone Density: not indicated   Lung Cancer Screening: (Low Dose  CT Chest recommended if Age  57-80 years, 30 pack-year currently smoking OR have quit w/in 15years.) does not qualify.    Additional Screening:  Hepatitis C Screening: does qualify; Completed 09/17/2019  Vision Screening: Recommended annual ophthalmology exams for early detection of glaucoma and other disorders of the eye. Is the patient up to date with their annual eye exam?  Yes  Who is the provider or what is the name of the office in which the pt attends annual eye exams? Dr.Newman   Dental Screening: Recommended annual dental exams for proper oral hygiene  Community Resource Referral:  CRR required this visit?  Yes     Plan:  I have personally reviewed and addressed the Medicare Annual Wellness questionnaire and have noted the following in the patient's chart:  A. Medical and social history B. Use of alcohol, tobacco or illicit drugs  C. Current medications and supplements D. Functional ability and status E.  Nutritional status F.  Physical activity G. Advance directives H. List of other physicians I.  Hospitalizations, surgeries, and ER visits in previous 12 months J.  [place] such as hearing and vision if needed, cognitive and depression L. Referrals and appointments   In addition, I have reviewed and discussed with patient certain preventive protocols, quality metrics, and best practice recommendations. A written personalized care plan for preventive services as well as general preventive health recommendations were provided to patient.   Signed,    Bevelyn Ngo, LPN   [id number]/[id number]  Nurse Health Advisor   Nurse Notes: none

## 2019-10-13 NOTE — Patient Instructions (Signed)
[name] , Thank you for taking time to come for your Medicare Wellness Visit. I appreciate your ongoing commitment to your health goals. Please review the following plan we discussed and let me know if I can assist you in the future.   Screening recommendations/referrals: Colonoscopy: completed 202, due 2030 Mammogram: Please call [phone] to schedule your mammogram.  Bone Density: not indicated  Recommended yearly ophthalmology/optometry visit for glaucoma screening and checkup Recommended yearly dental visit for hygiene and checkup  Vaccinations: Influenza vaccine: declined  Pneumococcal vaccine: not indicated  Tdap vaccine: up to date  Shingles vaccine: shingrix eligible, check with your insurance for coverage   Covid-19: We are recommending the vaccine to everyone who has not had an allergic reaction to any of the components of the vaccine. If you have specific questions about the vaccine, please bring them up with your health care provider to discuss them.   We will likely not be getting the vaccine in the office for the first rounds of vaccinations. The way they are releasing the vaccines is going to be through the health systems (like [place], [place], [facility], [facility]), through your county health department, or through the pharmacies.   The [facility] Department is giving vaccines to those 65+ and Health Care Workers Teachers and [place] providers start 07/15/19, Essential workers start 3/10 and those with co-morbidities start 08/12/19 Call [phone] to schedule  If you are 65+ you can get a vaccine through [facility] by signing up for an appointment.  You can sign up by going to: [email or web address].  You can get more information by going to: [email or web address]  [employer] next door is giving the [employer]- you can call [phone] or stop by there to schedule.  Advanced directives: Advance directive discussed with you today.  Please pick up a copy next time you are in the office. Once this is complete please bring a copy in to our office so we can scan it into your chart.  Conditions/risks identified: Discussed assistance for housing, someone will reach out to you to discuss assistance.   Next appointment: Follow up in one year for your annual wellness visit. 10/18/2020 at 240pm  Preventive Care 40-64 Years, Female Preventive care refers to lifestyle choices and visits with your health care provider that can promote health and wellness. What does preventive care include?  A yearly physical exam. This is also called an annual well check.  Dental exams once or twice a year.  Routine eye exams. Ask your health care provider how often you should have your eyes checked.  Personal lifestyle choices, including:  Daily care of your teeth and gums.  Regular physical activity.  Eating a healthy diet.  Avoiding tobacco and drug use.  Limiting alcohol use.  Practicing safe sex.  Taking low-dose aspirin daily starting at age 20.  Taking vitamin and mineral supplements as recommended by your health care provider. What happens during an annual well check? The services and screenings done by your health care provider during your annual well check will depend on your age, overall health, lifestyle risk factors, and family history of disease. Counseling  Your health care provider may ask you questions about your:  Alcohol use.  Tobacco use.  Drug use.  Emotional well-being.  Home and relationship well-being.  Sexual activity.  Eating habits.  Work and work Statistician.  Method of birth control.  Menstrual cycle.  Pregnancy history. Screening  You may have the following tests or measurements:  Height, weight, and BMI.  Blood pressure.  Lipid and cholesterol levels. These may be checked every 5 years, or more frequently if you are over 51 years old.  Skin check.  Lung cancer screening. You  may have this screening every year starting at age 52 if you have a 30-pack-year history of smoking and currently smoke or have quit within the past 15 years.  Fecal occult blood test (FOBT) of the stool. You may have this test every year starting at age 71.  Flexible sigmoidoscopy or colonoscopy. You may have a sigmoidoscopy every 5 years or a colonoscopy every 10 years starting at age 7.  Hepatitis C blood test.  Hepatitis B blood test.  Sexually transmitted disease (STD) testing.  Diabetes screening. This is done by checking your blood sugar (glucose) after you have not eaten for a while (fasting). You may have this done every 1-3 years.  Mammogram. This may be done every 1-2 years. Talk to your health care provider about when you should start having regular mammograms. This may depend on whether you have a family history of breast cancer.  BRCA-related cancer screening. This may be done if you have a family history of breast, ovarian, tubal, or peritoneal cancers.  Pelvic exam and Pap test. This may be done every 3 years starting at age 41. Starting at age 74, this may be done every 5 years if you have a Pap test in combination with an HPV test.  Bone density scan. This is done to screen for osteoporosis. You may have this scan if you are at high risk for osteoporosis. Discuss your test results, treatment options, and if necessary, the need for more tests with your health care provider. Vaccines  Your health care provider may recommend certain vaccines, such as:  Influenza vaccine. This is recommended every year.  Tetanus, diphtheria, and acellular pertussis (Tdap, Td) vaccine. You may need a Td booster every 10 years.  Zoster vaccine. You may need this after age 5.  Pneumococcal 13-valent conjugate (PCV13) vaccine. You may need this if you have certain conditions and were not previously vaccinated.  Pneumococcal polysaccharide (PPSV23) vaccine. You may need one or two doses if  you smoke cigarettes or if you have certain conditions. Talk to your health care provider about which screenings and vaccines you need and how often you need them. This information is not intended to replace advice given to you by your health care provider. Make sure you discuss any questions you have with your health care provider. Document Released: 06/03/2015 Document Revised: 01/25/2016 Document Reviewed: 03/08/2015 Elsevier Interactive Patient Education  2017 [place] Prevention in the Home Falls can cause injuries. They can happen to people of all ages. There are many things you can do to make your home safe and to help prevent falls. What can I do on the outside of my home?  Regularly fix the edges of walkways and driveways and fix any cracks.  Remove anything that might make you trip as you walk through a door, such as a raised step or threshold.  Trim any bushes or trees on the path to your home.  Use bright outdoor lighting.  Clear any walking paths of anything that might make someone trip, such as rocks or tools.  Regularly check to see if handrails are loose or broken. Make sure that both sides of any steps have handrails.  Any raised decks and porches should have guardrails on the edges.  Have any leaves, snow, or ice cleared regularly.  Use sand or salt on walking paths during winter.  Clean up any spills in your garage right away. This includes oil or grease spills. What can I do in the bathroom?  Use night lights.  Install grab bars by the toilet and in the tub and shower. Do not use towel bars as grab bars.  Use non-skid mats or decals in the tub or shower.  If you need to sit down in the shower, use a plastic, non-slip stool.  Keep the floor dry. Clean up any water that spills on the floor as soon as it happens.  Remove soap buildup in the tub or shower regularly.  Attach bath mats securely with double-sided non-slip rug tape.  Do not  have throw rugs and other things on the floor that can make you trip. What can I do in the bedroom?  Use night lights.  Make sure that you have a light by your bed that is easy to reach.  Do not use any sheets or blankets that are too big for your bed. They should not hang down onto the floor.  Have a firm chair that has side arms. You can use this for support while you get dressed.  Do not have throw rugs and other things on the floor that can make you trip. What can I do in the kitchen?  Clean up any spills right away.  Avoid walking on wet floors.  Keep items that you use a lot in easy-to-reach places.  If you need to reach something above you, use a strong step stool that has a grab bar.  Keep electrical cords out of the way.  Do not use floor polish or wax that makes floors slippery. If you must use wax, use non-skid floor wax.  Do not have throw rugs and other things on the floor that can make you trip. What can I do with my stairs?  Do not leave any items on the stairs.  Make sure that there are handrails on both sides of the stairs and use them. Fix handrails that are broken or loose. Make sure that handrails are as long as the stairways.  Check any carpeting to make sure that it is firmly attached to the stairs. Fix any carpet that is loose or worn.  Avoid having throw rugs at the top or bottom of the stairs. If you do have throw rugs, attach them to the floor with carpet tape.  Make sure that you have a light switch at the top of the stairs and the bottom of the stairs. If you do not have them, ask someone to add them for you. What else can I do to help prevent falls?  Wear shoes that:  Do not have high heels.  Have rubber bottoms.  Are comfortable and fit you well.  Are closed at the toe. Do not wear sandals.  If you use a stepladder:  Make sure that it is fully opened. Do not climb a closed stepladder.  Make sure that both sides of the stepladder are  locked into place.  Ask someone to hold it for you, if possible.  Clearly mark and make sure that you can see:  Any grab bars or handrails.  First and last steps.  Where the edge of each step is.  Use tools that help you move around (mobility aids) if they are needed. These include:  Canes.  Walkers.  Scooters.  Crutches.  Turn on the lights when you go into a dark area. Replace any light bulbs as soon as they burn out.  Set up your furniture so you have a clear path. Avoid moving your furniture around.  If any of your floors are uneven, fix them.  If there are any pets around you, be aware of where they are.  Review your medicines with your doctor. Some medicines can make you feel dizzy. This can increase your chance of falling. Ask your doctor what other things that you can do to help prevent falls. This information is not intended to replace advice given to you by your health care provider. Make sure you discuss any questions you have with your health care provider. Document Released: 03/03/2009 Document Revised: 10/13/2015 Document Reviewed: 06/11/2014 Elsevier Interactive Patient Education  2017 [employer].

## 2019-10-15 ENCOUNTER — Telehealth: Payer: Self-pay

## 2019-10-15 NOTE — Telephone Encounter (Signed)
Copied from [place] [phone]. Topic: General - Other >> Oct 15, 2019 10:23 AM Keene Breath wrote: Reason for CRM: Patient called to speak with the nurse, she believes had spoken with her last week about getting a letter from the doctor to be able to move to a lower level apt.  Patient stated that the person she spoke with told her to call and the office would assist her in getter this letter.  She did not recall who she spoke with or whether it was a Education officer, museum.  Please advise and call patient to discuss at [phone]   Pt informed will here something next week from [facility] or [place]ra Ricks reg letter request.

## 2019-10-21 NOTE — Telephone Encounter (Signed)
In patient's chart this was requested on 10/15/2019 from her Neurology office with [facility].  Please have patient contact them regarding this request.  Thanks

## 2019-10-21 NOTE — Telephone Encounter (Signed)
Attempted to contact the patient back, no answer. I left a detail message concern her request on her voicemail.

## 2019-11-05 ENCOUNTER — Encounter: Payer: Self-pay | Admitting: Student in an Organized Health Care Education/Training Program

## 2019-11-05 ENCOUNTER — Ambulatory Visit
Payer: Medicare Other | Attending: Student in an Organized Health Care Education/Training Program | Admitting: Student in an Organized Health Care Education/Training Program

## 2019-11-05 ENCOUNTER — Other Ambulatory Visit: Payer: Self-pay

## 2019-11-05 VITALS — BP 157/96 | HR 98 | Temp 98.0°F | Resp 16 | Ht 60.0 in | Wt 203.0 lb

## 2019-11-05 DIAGNOSIS — M4714 Other spondylosis with myelopathy, thoracic region: Secondary | ICD-10-CM | POA: Diagnosis not present

## 2019-11-05 DIAGNOSIS — M25552 Pain in left hip: Secondary | ICD-10-CM

## 2019-11-05 DIAGNOSIS — M545 Low back pain, unspecified: Secondary | ICD-10-CM

## 2019-11-05 DIAGNOSIS — M25551 Pain in right hip: Secondary | ICD-10-CM

## 2019-11-05 DIAGNOSIS — M797 Fibromyalgia: Secondary | ICD-10-CM | POA: Diagnosis not present

## 2019-11-05 DIAGNOSIS — G950 Syringomyelia and syringobulbia: Secondary | ICD-10-CM | POA: Insufficient documentation

## 2019-11-05 DIAGNOSIS — G8929 Other chronic pain: Secondary | ICD-10-CM | POA: Insufficient documentation

## 2019-11-05 DIAGNOSIS — Z96652 Presence of left artificial knee joint: Secondary | ICD-10-CM | POA: Insufficient documentation

## 2019-11-05 DIAGNOSIS — M47817 Spondylosis without myelopathy or radiculopathy, lumbosacral region: Secondary | ICD-10-CM | POA: Insufficient documentation

## 2019-11-05 DIAGNOSIS — G894 Chronic pain syndrome: Secondary | ICD-10-CM

## 2019-11-05 MED ORDER — MELOXICAM 15 MG PO TABS: 15.0000 mg | ORAL_TABLET | Freq: Every day | ORAL | 5 refills | Status: DC | PRN

## 2019-11-05 MED ORDER — PREGABALIN 75 MG PO CAPS: 150.0000 mg | ORAL_CAPSULE | Freq: Every day | ORAL | 5 refills | Status: DC

## 2019-11-05 MED ORDER — BUPRENORPHINE 7.5 MCG/HR TD PTWK: 1.0000 | MEDICATED_PATCH | TRANSDERMAL | 1 refills | Status: DC

## 2019-11-05 MED ORDER — TRAMADOL HCL 50 MG PO TABS: 50.0000 mg | ORAL_TABLET | Freq: Two times a day (BID) | ORAL | 1 refills | Status: DC | PRN

## 2019-11-05 MED ORDER — AMITRIPTYLINE HCL 25 MG PO TABS: 25.0000 mg | ORAL_TABLET | Freq: Every day | ORAL | 5 refills | Status: DC

## 2019-11-05 NOTE — Progress Notes (Signed)
Nursing Pain Medication Assessment:  Safety precautions to be maintained throughout the outpatient stay will include: orient to surroundings, keep bed in low position, maintain call bell within reach at all times, provide assistance with transfer out of bed and ambulation.  Medication Inspection Compliance: Kristin Porter did not comply with our request to bring her pills to be counted. She was reminded that bringing the medication bottles, even when empty, is a requirement.  Medication: None brought in. Pill/Patch Count: None available to be counted. Bottle Appearance: No container available. Did not bring bottle(s) to appointment. Filled Date: N/A Last Medication intake:  last Friday.  messed up a patch and does not have one on currently.

## 2019-11-05 NOTE — Progress Notes (Signed)
PROVIDER NOTE: Information contained herein reflects review and annotations entered in association with encounter. Interpretation of such information and data should be left to medically-trained personnel. Information provided to patient can be located elsewhere in the medical record under "Patient Instructions". Document created using STT-dictation technology, any transcriptional errors that may result from process are unintentional.    Patient: Kristin Porter  Service Category: E/M  Provider: Gillis Santa, MD  DOB: 1960/08/24  DOS: 11/05/2019  Specialty: Interventional Pain Management  MRN: [id number]  Setting: Ambulatory outpatient  PCP: Verl Bangs, FNP  Type: Established Patient    Referring Provider: Verl Bangs, FNP  Location: Office  Delivery: Face-to-face     HPI  Reason for encounter: Kristin Porter, a 59 y.o. year old female, is here today for evaluation and management of her Thoracic myelopathy [M47.14]. Kristin Porter primary complain today is Back Pain Last encounter: Practice (10/07/2019). My last encounter with her was on 10/07/2019. Pertinent problems: Kristin Porter has Chronic pain of both hips; Depression; Fibromyalgia; History of total knee arthroplasty, left; Lumbosacral spondylosis without myelopathy; Multiple joint pain; OA (osteoarthritis) of knee; and Chronic pain syndrome on their pertinent problem list. Pain Assessment: Severity of Chronic pain is reported as a 8 /10. Location: Back Lower/down both legs all the way to both feet.. Onset: More than a month ago. Quality: Aching, Sharp, Dull, Throbbing. Timing: Constant. Modifying factor(s): sitting down. Vitals:  height is 5' (1.524 m) and weight is 203 lb (92.1 kg). Her temporal temperature is 98 F (36.7 C). Her blood pressure is 157/96 (abnormal) and her pulse is 98. Her respiration is 16 and oxygen saturation is 100%.   Patient follows up today for medication management.  Of note she was a previous pain  patient at [facility] pain medicine with Dr. Manus Gunning.  At her last visit, her Lyrica dose was reduced to 150 mg nightly given side effects of sedation during the daytime.  She states that dose reduction has helped with her daytime drowsiness and that she does find benefit with Lyrica at bedtime.  She endorses pruritus with tramadol.  She does utilize Benadryl to help but it is not very effective for the itching.  At her last visit, she was started on buprenorphine at 5 mcg an hour.  She does find analgesic benefit with this medication.  She feels like she could obtain better benefit at a higher dose, will increase to 7.5 mcg patch today.  No side effects noted with buprenorphine.   Pharmacotherapy Assessment   10/08/2019  2   10/07/2019  Buprenorphine 5 Mcg/Hr Patch  4.00  28 Bi Lat   [id number]   [facility]   0  0.12 mg  Medicare   [place]  10/07/2019  2   10/07/2019  Pregabalin 75 MG Capsule  60.00  30 Bi Lat   [id number]   [facility] (4231)   0  1.00 LME  Medicare   [place]     Monitoring:  PMP: PDMP reviewed during this encounter.       Pharmacotherapy: No side-effects or adverse reactions reported. Compliance: No problems identified. Effectiveness: Clinically acceptable.  UDS:  Summary  Date Value Ref Range Status  09/10/2019 Note  Final    Comment:    ==================================================================== Compliance Drug Analysis, Ur ==================================================================== Specimen Alert Note: Urinary creatinine is low; ability to detect some drugs may be compromised. Interpret results with caution. (Creatinine) ==================================================================== Test  Result       Flag       Units Drug Present and Declared for Prescription Verification   Tramadol                       2744         EXPECTED   ng/mg creat   O-Desmethyltramadol            3256         EXPECTED   ng/mg creat    Source of tramadol is a  prescription medication. O-desmethyltramadol    is an expected metabolite of tramadol. Drug Present not Declared for Prescription Verification   Acetaminophen                  PRESENT      UNEXPECTED   Diphenhydramine                PRESENT      UNEXPECTED Drug Absent but Declared for Prescription Verification   Pregabalin                     Not Detected UNEXPECTED   Baclofen                       Not Detected UNEXPECTED   Amitriptyline                  Not Detected UNEXPECTED   Paroxetine                     Not Detected UNEXPECTED   Trazodone                      Not Detected UNEXPECTED   Diclofenac                     Not Detected UNEXPECTED    Topical diclofenac, as indicated in the declared medication list, is    not always detected even when used as directed. ==================================================================== Test                      Result    Flag   Units      Ref Range   Creatinine              16        LL     mg/dL      >=20 ==================================================================== Declared Medications:  The flagging and interpretation on this report are based on the  following declared medications.  Unexpected results may arise from  inaccuracies in the declared medications.  **Note: The testing scope of this panel includes these medications:  Amitriptyline  Baclofen  Paroxetine  Pregabalin  Tramadol  Trazodone  **Note: The testing scope of this panel does not include small to  moderate amounts of these reported medications:  Topical Diclofenac (Flector)  **Note: The testing scope of this panel does not include the  following reported medications:  Albuterol  Amlodipine  Atorvastatin  Fluticasone (Flonase)  Losartan  Meloxicam  Naloxone  Omeprazole  Undefined Miscellaneous Drug ==================================================================== For clinical consultation, please call [phone]. ====================================================================       ROS  Constitutional: Denies any fever or chills Gastrointestinal: No reported hemesis, hematochezia, vomiting, or acute GI distress Musculoskeletal: Denies any acute onset joint swelling, redness, loss of ROM, or weakness Neurological: No reported episodes of acute onset apraxia,  aphasia, dysarthria, agnosia, amnesia, paralysis, loss of coordination, or loss of consciousness  Medication Review  Blood Pressure Kit, NONFORMULARY OR COMPOUNDED ITEM, PARoxetine, albuterol, amLODipine, amitriptyline, atorvastatin, baclofen, buprenorphine, fluticasone, losartan, meloxicam, naloxone, omeprazole, pregabalin, traMADol, and zolpidem  History Review  Allergy: Kristin Porter is allergic to tizanidine, lisinopril, and tramadol. Drug: Kristin Porter  reports no history of drug use. Alcohol:  reports previous alcohol use. Tobacco:  reports that she quit smoking about 51 years ago. She has never used smokeless tobacco. Social: Kristin Porter  reports that she quit smoking about 51 years ago. She has never used smokeless tobacco. She reports previous alcohol use. She reports that she does not use drugs. Medical:  has a past medical history of Arthritis, Fibromyalgia, Hypertension, and Neuropathy. Surgical: Kristin Porter  has no past surgical history on file. Family: Family history is unknown by patient.  Laboratory Chemistry Profile   Renal Lab Results  Component Value Date   BUN 15 09/17/2019   CREATININE 0.69 29/92/4268   BCR NOT APPLICABLE 34/19/6222   GFRAA 109 09/17/2019   GFRNONAA 94 09/17/2019     Hepatic Lab Results  Component Value Date   AST 19 09/17/2019   ALT 19 09/17/2019     Electrolytes Lab Results  Component Value Date   NA 140 09/17/2019   K 4.7 09/17/2019   CL 104 09/17/2019   CALCIUM 9.6 09/17/2019     Bone No results found for: VD25OH, VD125OH2TOT, [id number], ER7408XK4, 25OHVITD1,  25OHVITD2, 25OHVITD3, TESTOFREE, TESTOSTERONE   Inflammation (CRP: Acute Phase) (ESR: Chronic Phase) No results found for: CRP, ESRSEDRATE, LATICACIDVEN     Note: Above Lab results reviewed.  Recent Imaging Review  DG Bone Density EXAM: DUAL X-RAY ABSORPTIOMETRY (DXA) FOR BONE MINERAL DENSITY  IMPRESSION: crr  Your patient Kristin Porter completed a BMD test on 09/28/2019 using the [place] (analysis version: 14.10) manufactured by [employer]. The following summarizes the results of our evaluation.  PATIENT BIOGRAPHICAL: Name: Kristin Porter, Kristin Porter Patient ID: [id number] Birth Date: 07/02/1960  Height:     59.0 in. Gender: Female Exam Date: 09/28/2019  Weight:     208.2 lbs. Indications: arthritis, lt knee replacement, POSTmenopausal Fractures:  Treatments: aspirin, Calcium, lyrica, multivitamin  ASSESSMENT: The BMD measured at Femur Neck Right is 0.827 g/cm2 with a T-score of -1.5.  This patient is considered OSTEOPENIC according to [facility]) criteria.  The scan quality is good.  Site Region Measured Measured WHO Young Adult BMD Date       Age      Classification T-score  AP Spine L1-L4 09/28/2019 61.3 Normal 0.2 1.201 g/cm2  DualFemur Neck Right 09/28/2019 61.3 Osteopenia -1.5 0.827 g/cm2  World Health Organization [facility]) criteria for post-menopausal, Caucasian Women: Normal:       T-score at or above -1 SD Osteopenia:   T-score between -1 and -2.5 SD Osteoporosis: T-score at or below -2.5 SD  RECOMMENDATIONS: 1. All patients should optimize calcium and vitamin D intake. 2. Consider FDA-approved medical therapies in postmenopausal women and men aged 25 years and older, based on the following: a. A hip or vertebral (clinical or morphometric) fracture b. T-score < -2.5 at the femoral neck or spine after appropriate evaluation to exclude secondary causes c. Low bone mass (T-score between -1.0 and -2.5 at the  femoral neck or spine) and a 10-year probability of a hip fracture > 3% or a 10-year probability of a major osteoporosis-related fracture > 20% based on the  [place]-adapted WHO algorithm d. Clinician judgment and/or patient preferences may indicate treatment for people with 10-year fracture probabilities above or below these levels  FOLLOW-UP: People with diagnosed cases of osteoporosis or at high risk for fracture should have regular bone mineral density tests. For patients eligible for Medicare, routine testing is allowed once every 2 years. The testing frequency can be increased to one year for patients who have rapidly progressing disease, those who are receiving or discontinuing medical therapy to restore bone mass, or have additional risk factors.  I have reviewed this report, and agree with the above findings.  Kristin Porter, M.D. [facility] crr  Your patient Kristin Porter completed a FRAX assessment on 09/28/2019 using the [place] (analysis version: 14.10) manufactured by [employer]. The following summarizes the results of our evaluation.  PATIENT BIOGRAPHICAL: Name: Kristin Porter, Kristin Porter Patient ID: [id number] Birth Date: 1960-06-20 Height:    59.0 in. Gender:     Female    Age:        59.3       Weight:    208.2 lbs. Ethnicity:  Black                            Exam Date: 09/28/2019  FRAX* RESULTS:  (version: 3.5) 10-year Probability of Fracture1 Major Osteoporotic Fracture2 Hip Fracture 3.3% 0.3% Population: [place] (Black) Risk Factors: None  Based on Femur (Right) Neck BMD  1 -The 10-year probability of fracture may be lower than reported if the patient has received treatment. 2 -Major Osteoporotic Fracture: Clinical Spine, Forearm, Hip or Shoulder  *FRAX is a Materials engineer of the [employer] for Metabolic Bone Disease, a [place] (WHO) [employer].  ASSESSMENT: The probability of a major osteoporotic fracture is 3.3% within the next ten years. The probability of a hip fracture is 0.3% within the next ten years.  I have reviewed this report and agree with the above findings.  Kristin Porter, M.D.  [facility]  Electronically Signed   By: Lavonia Dana M.D.   On: 09/28/2019 14:38 Note: Reviewed        Physical Exam  General appearance: Well nourished, well developed, and well hydrated. In no apparent acute distress Mental status: Alert, oriented x 3 (person, place, & time)       Respiratory: No evidence of acute respiratory distress Eyes: PERLA Vitals: BP (!) 157/96 (BP Location: Left Arm, Patient Position: Sitting, Cuff Size: Normal)   Pulse 98   Temp 98 F (36.7 C) (Temporal)   Resp 16   Ht 5' (1.524 m)   Wt 203 lb (92.1 kg)   SpO2 100%   BMI 39.65 kg/m  BMI: Estimated body mass index is 39.65 kg/m as calculated from the following:   Height as of this encounter: 5' (1.524 m).   Weight as of this encounter: 203 lb (92.1 kg). Ideal: Ideal body weight: 45.5 kg (100 lb 4.9 oz) Adjusted ideal body weight: 64.1 kg (141 lb 6.2 oz) Lumbar Spine Area Exam  Skin & Axial Inspection: No masses, redness, or swelling Alignment: Symmetrical Functional ROM: Pain restricted ROM affecting both sides Stability: No instability detected Muscle Tone/Strength: Functionally intact. No obvious neuro-muscular anomalies detected. Sensory (Neurological): Musculoskeletal pain pattern  Gait & Posture Assessment  Ambulation: Unassisted Gait: Relatively normal for age and body habitus Posture: WNL   Lower Extremity Exam    Side:  Right lower extremity  Side: Left lower extremity  Stability: No instability observed          Stability: No instability observed          Skin & Extremity Inspection: Skin color, temperature, and hair growth are WNL. No peripheral edema or cyanosis. No masses, redness, swelling, asymmetry, or  associated skin lesions. No contractures.  Skin & Extremity Inspection: Skin color, temperature, and hair growth are WNL. No peripheral edema or cyanosis. No masses, redness, swelling, asymmetry, or associated skin lesions. No contractures.  Functional ROM: Unrestricted ROM                  Functional ROM: Unrestricted ROM                  Muscle Tone/Strength: Functionally intact. No obvious neuro-muscular anomalies detected.  Muscle Tone/Strength: Functionally intact. No obvious neuro-muscular anomalies detected.  Sensory (Neurological): Musculoskeletal pain pattern        Sensory (Neurological): Musculoskeletal pain pattern        DTR: Patellar: deferred today Achilles: deferred today Plantar: deferred today  DTR: Patellar: deferred today Achilles: deferred today Plantar: deferred today  Palpation: No palpable anomalies  Palpation: No palpable anomalies   Assessment   Status Diagnosis  Persistent Responding Persistent 1. Thoracic myelopathy (due to syrinx, s/p excision 12/23/2017)   2. Fibromyalgia   3. Chronic pain syndrome   4. Chronic bilateral low back pain without sciatica   5. Syrinx of spinal cord ([place])   6. History of total knee arthroplasty, left   7. Chronic pain of both hips   8. Lumbosacral spondylosis without myelopathy      Updated Problems: Problem  Chronic Pain Syndrome  Chronic Pain of Both Hips  Lumbosacral Spondylosis Without Myelopathy  History of Total Knee Arthroplasty, Left  Multiple Joint Pain  Oa (Osteoarthritis) of Knee  Depression   Last Assessment & Plan:  She is tolerating zoloft started by Dr. Oliva Bustard  Last Assessment & Plan:  Formatting of this note might be different from the original. She is tolerating zoloft started by Dr. Oliva Bustard   Fibromyalgia   0109 Tricyclic antidepressants caused urinary retention.  Lyrica caused nightmares.  Zoloft and citalopram were not helpful.  Venlafaxine was not helpful.  She has had some relief with tramadol,  gabapentin and Cymbalta as well as IV lidocaine infusion.  Formatting of this note might be different from the original. 3235 Tricyclic antidepressants caused urinary retention.  Lyrica caused nightmares.  Zoloft and citalopram were not helpful.  Venlafaxine was not helpful.  She has had some relief with tramadol, gabapentin and Cymbalta as well as IV lidocaine infusion.   Gait Instability   Last Assessment & Plan:  She continues working with rehabilatation  Last Assessment & Plan:  Formatting of this note might be different from the original. She continues working with [employer] [facility]  Kristin Porter has a current medication list which includes the following long-term medication(s): albuterol, amitriptyline, amlodipine, atorvastatin, fluticasone, losartan, omeprazole, paroxetine, pregabalin, and zolpidem.  1. Increase Butrans patch to 7.5 mcg an hour. 2.  Refill chronic pain medications as below including Lyrica, amitriptyline, Mobic, tramadol.  Pharmacotherapy (Medications Ordered): Meds ordered this encounter  Medications  . meloxicam (MOBIC) 15 MG tablet    Sig: Take 1 tablet (15 mg total) by mouth daily as needed for pain.    Dispense:  30 tablet    Refill:  5  .  amitriptyline (ELAVIL) 25 MG tablet    Sig: Take 1 tablet (25 mg total) by mouth at bedtime.    Dispense:  30 tablet    Refill:  5  . pregabalin (LYRICA) 75 MG capsule    Sig: Take 2 capsules (150 mg total) by mouth at bedtime.    Dispense:  60 capsule    Refill:  5  . traMADol (ULTRAM) 50 MG tablet    Sig: Take 1 tablet (50 mg total) by mouth every 12 (twelve) hours as needed for severe pain.    Dispense:  60 tablet    Refill:  1  . buprenorphine (BUTRANS) 7.5 MCG/HR    Sig: Place 1 patch onto the skin every 7 (seven) days. For chronic pain syndrome    Dispense:  4 patch    Refill:  1    Do not place this medication, or any other prescription from our practice, on "Automatic  Refill". Patient may have prescription filled one day early if pharmacy is closed on scheduled refill date.   Follow-up plan:   Return in about 8 weeks (around 12/31/2019) for Medication Management, in person.   Recent Visits Date Type Provider Dept  10/07/19 Office Visit Gillis Santa, MD [facility]  09/09/19 Office Visit Gillis Santa, MD [facility]  Showing recent visits within past 90 days and meeting all other requirements Today's Visits Date Type Provider Dept  11/05/19 Office Visit Gillis Santa, MD [facility]  Showing today's visits and meeting all other requirements Future Appointments No visits were found meeting these conditions. Showing future appointments within next 90 days and meeting all other requirements  I discussed the assessment and treatment plan with the patient. The patient was provided an opportunity to ask questions and all were answered. The patient agreed with the plan and demonstrated an understanding of the instructions.  Patient advised to call back or seek an in-person evaluation if the symptoms or condition worsens.  Duration of encounter: 30 minutes.  Note by: Gillis Santa, MD Date: 11/05/2019; Time: 2:17 PM

## 2019-11-10 ENCOUNTER — Telehealth: Payer: Self-pay | Admitting: Family Medicine

## 2019-11-10 NOTE — Telephone Encounter (Signed)
  Community Resource Referral   [place] 11/10/2019  DOB: 03-06-1961   AGE: 59 y.o.   GENDER: female   PCP [place], Lupita Raider, FNP.   Called pt regarding Community Resource Referral for assistance with finding first floor housing. Pt stated that she was able to obtain a letter from her Neurologist and that she will be moving to a first floor apt by July 15th. Provided information to pt to please connect back with [facility] if she needs additional assistance with DME or making her apt more safe if she feels she should need that in the future and we can place a referral to [facility]. Closing referral pending any other needs of patient. [place] . [email or web address]  [phone]

## 2019-11-11 ENCOUNTER — Telehealth: Payer: Self-pay | Admitting: Pain Medicine

## 2019-11-11 MED ORDER — BUPRENORPHINE 7.5 MCG/HR TD PTWK: 1.0000 | MEDICATED_PATCH | TRANSDERMAL | 1 refills | Status: DC

## 2019-11-11 NOTE — Telephone Encounter (Signed)
Dr Holley Raring, could you resend the script for Butrans 7.5 to [facility].  The other [facility] does not have them at this time.  Thank you

## 2019-11-11 NOTE — Telephone Encounter (Signed)
Patient called stating [facility] on [address] states they can no longer get the patch strength that DR. Dossie Arbour wrote her at last visit, it is no longer being made. Please let patient know solution.

## 2019-12-21 ENCOUNTER — Other Ambulatory Visit: Payer: Self-pay

## 2019-12-21 ENCOUNTER — Ambulatory Visit (INDEPENDENT_AMBULATORY_CARE_PROVIDER_SITE_OTHER): Payer: Medicare Other | Admitting: Family Medicine

## 2019-12-21 ENCOUNTER — Encounter: Payer: Self-pay | Admitting: Family Medicine

## 2019-12-21 VITALS — BP 139/74 | HR 84 | Temp 98.4°F | Resp 17 | Ht 60.0 in | Wt 199.6 lb

## 2019-12-21 DIAGNOSIS — F339 Major depressive disorder, recurrent, unspecified: Secondary | ICD-10-CM | POA: Diagnosis not present

## 2019-12-21 DIAGNOSIS — G5600 Carpal tunnel syndrome, unspecified upper limb: Secondary | ICD-10-CM | POA: Insufficient documentation

## 2019-12-21 DIAGNOSIS — J302 Other seasonal allergic rhinitis: Secondary | ICD-10-CM | POA: Diagnosis not present

## 2019-12-21 DIAGNOSIS — I10 Essential (primary) hypertension: Secondary | ICD-10-CM

## 2019-12-21 DIAGNOSIS — K2100 Gastro-esophageal reflux disease with esophagitis, without bleeding: Secondary | ICD-10-CM

## 2019-12-21 DIAGNOSIS — E782 Mixed hyperlipidemia: Secondary | ICD-10-CM | POA: Diagnosis not present

## 2019-12-21 DIAGNOSIS — G5603 Carpal tunnel syndrome, bilateral upper limbs: Secondary | ICD-10-CM

## 2019-12-21 MED ORDER — ATORVASTATIN CALCIUM 80 MG PO TABS: 80.0000 mg | ORAL_TABLET | Freq: Every day | ORAL | 1 refills | Status: DC

## 2019-12-21 MED ORDER — AMLODIPINE BESYLATE 10 MG PO TABS: 10.0000 mg | ORAL_TABLET | Freq: Every day | ORAL | 1 refills | Status: DC

## 2019-12-21 MED ORDER — LOSARTAN POTASSIUM 50 MG PO TABS: 50.0000 mg | ORAL_TABLET | Freq: Every day | ORAL | 1 refills | Status: DC

## 2019-12-21 MED ORDER — OMEPRAZOLE 40 MG PO CPDR: 40.0000 mg | DELAYED_RELEASE_CAPSULE | Freq: Every day | ORAL | 1 refills | Status: DC | PRN

## 2019-12-21 MED ORDER — PAROXETINE HCL 10 MG PO TABS: 10.0000 mg | ORAL_TABLET | Freq: Every day | ORAL | 1 refills | Status: DC

## 2019-12-21 MED ORDER — FLUTICASONE PROPIONATE 50 MCG/ACT NA SUSP: 1.0000 | Freq: Two times a day (BID) | NASAL | 1 refills | Status: DC

## 2019-12-21 NOTE — Assessment & Plan Note (Signed)
Bilateral carpal tunnel x 3 months with worsening of symptoms over the past 2 weeks.  Has bought OTC cock-up braces to wear, which has slightly helped decrease symptoms.  Discussed referral to orthopedics, may rx occupational therapy, EMG, steroid injections and/or surgery.  Patient in agreement with orthopedics referral.  Plan: 1. Referral to orthopedics placed

## 2019-12-21 NOTE — Assessment & Plan Note (Signed)
Seasonal allergies currently stable and well controlled with fluticasone 1 spray twice per day.  Plan: 1. Continue fluticasone 1 spray twice per day

## 2019-12-21 NOTE — Assessment & Plan Note (Signed)
Currently well controlled on omeprazole 40mg  once daily.  Plan: 1. Continue omeprazole 40mg  once daily. Side effects discussed. Pt wants to continue med. 2. Avoid diet triggers. Reviewed need to seek care if globus sensation, difficulty swallowing, s/sx of GI bleed. 3. Follow up as needed and in 3 months.

## 2019-12-21 NOTE — Assessment & Plan Note (Signed)
Has continued to take atorvastatin 80mg  and tolerating well.  We will plan to recheck lipid labs in the next 3 months for re-evaluation.  Plan: 1. Continue atorvastatin 80mg  daily 2. Recheck labs in 3 months

## 2019-12-21 NOTE — Patient Instructions (Addendum)
For Mammogram screening for breast cancer   Call the [facility] below anytime to schedule your own appointment now that order has been placed.  [facility] [place], [place] [address] Phone: [phone]  [facility] [address] [place], [place] [address] Phone: [phone]  Your medication refills have been sent to your pharmacy on file.  CONTINUE losartan 50mg  once daily.    Some of the possible side effects are:  - angioedema: swelling of lips, mouth, and tongue.  If this rare side effect occurs, please go to ED. - cough: you could develop a dry, hacking cough caused by this medicine.  If it occurs, it will go away after stopping this medicine.  Call the clinic before stopping the medication. - kidney damage: we will monitor your labs when we start this medicine and at least one time per year.  If you do not have an change in kidney function when starting this medicine, it will provide kidney protection over time.   Try to get exercise a minimum of 30 minutes per day at least 5 days per week as well as  adequate water intake all while measuring blood pressure a few times per week.  Keep a blood pressure log and bring back to clinic at your next visit.  If your readings are consistently over 130/80 to contact our office/send me a MyChart message and we will see you sooner.  Can try DASH and Mediterranean diet options, avoiding processed foods, lowering sodium intake, avoiding pork products, and eating a plant based diet for optimal health.  Education and discussion with patient regarding hypertension as well as the effects on the organs and body.  Specifically, we spoke about kidney disease, kidney failure, heart attack, stroke and up to and including death, as likely outcomes if non-compliant with blood pressure regulation.  Discussed how all of these habits are attached to each other and each has  the effect on each other.  A referral to Orthopedics has been placed today.  If you have not heard from the specialty office or our referral coordinator within 1 week, please let us know and we will follow up with the referral coordinator for an update.  We will plan to see you back in 3 months for hypertension and prediabetes follow up visit  You will receive a survey after today's visit either digitally by e-mail or paper by [place] mail. Your experiences and feedback matter to [place].  Please respond so we know how we are doing as we provide care for you.  Call us with any questions/concerns/needs.  It is my goal to be available to you for your health concerns.  Thanks for choosing me to be a partner in your healthcare needs!  Harlin Rain, FNP-C Family Nurse Practitioner [facility] Phone: [phone]

## 2019-12-21 NOTE — Assessment & Plan Note (Signed)
EXH3-7/JIR6-7.  Currently taking paroxetine 10mg  daily and tolerating well.  Reviewed mood handout.   Plan: 1. Continue paroxetine 10mg  daily 2. Review mood handout 3. RTC in 3 months

## 2019-12-21 NOTE — Assessment & Plan Note (Signed)
Uncontrolled hypertension.  Reports BP at goal < 130/80 at home.  Pt is working on lifestyle modifications.  Taking medications tolerating well without side effects.  Complications: morbid obesity, GERD, hyperlipidemia  Plan: 1. Continue taking amlodipine 10mg  daily and losartan 50mg  daily 2. Obtain labs in 3 months 3. Encouraged heart healthy diet and increasing exercise to 30 minutes most days of the week, going no more than 2 days in a row without exercise. 4. Check BP 1-2 x per week at home, keep log, and bring to clinic at next appointment. 5. Follow up 3 months.

## 2019-12-21 NOTE — Progress Notes (Signed)
Subjective:    Patient ID: Kristin Porter, female    DOB: [birth year], 59 y.o.   MRN: [id number]  Kristin Porter is a 59 y.o. female presenting on 12/21/2019 for Hypertension and Wrist Pain   HPI   [name] presents to clinic for follow up on her hypertension and concerns for bilateral wrist pain with numbness/tingling in fingertips.  Reports she has had this discomfort in both of her wrists and numbness/tingling into her finger tips for > 3 months, with worsening of symptoms over the past 2 weeks.  Has bought cock-up wrist splints to wear that have helped lessen the pain, but only while she is wearing them.  Denies injury/fall/trauma or accident with either wrist.  Hypertension - She is not checking BP at home or outside of clinic.    - Current medications: amlodipine 10mg  daily, losratan 50mg  daily, tolerating well without side effects - She is not currently symptomatic. - Pt denies headache, lightheadedness, dizziness, changes in vision, chest tightness/pressure, palpitations, leg swelling, sudden loss of speech or loss of consciousness. - She  reports no regular exercise routine. - Her diet is high in salt, high in fat, and high in carbohydrates.  Depression screen [facility] 2/9 12/21/2019 11/05/2019 10/13/2019  Decreased Interest 0 0 0  Down, Depressed, Hopeless 0 0 0  PHQ - 2 Score 0 0 0  Altered sleeping 3 - -  Tired, decreased energy 1 - -  Change in appetite 2 - -  Feeling bad or failure about yourself  0 - -  Trouble concentrating 0 - -  Moving slowly or fidgety/restless 0 - -  Suicidal thoughts 0 - -  PHQ-9 Score 6 - -  Difficult doing work/chores Very difficult - -    Social History   Tobacco Use  . Smoking status: Former Smoker    Quit date: 1970    Years since quitting: 51.6  . Smokeless tobacco: Never Used  Vaping Use  . Vaping Use: Never used  Substance Use Topics  . Alcohol use: Not Currently  . Drug use: Never    Review of Systems  Constitutional:  Negative.   HENT: Negative.   Eyes: Negative.   Respiratory: Negative.   Cardiovascular: Negative.   Gastrointestinal: Negative.   Endocrine: Negative.   Genitourinary: Negative.   Musculoskeletal: Negative.   Skin: Negative.   Allergic/Immunologic: Negative.   Neurological: Negative.   Hematological: Negative.   Psychiatric/Behavioral: Negative.    Per HPI unless specifically indicated above     Objective:    BP (!) 139/74 (BP Location: Left Arm, Patient Position: Sitting, Cuff Size: Large)   Pulse 84   Temp 98.4 F (36.9 C) (Oral)   Resp 17   Ht 5' (1.524 m)   Wt 199 lb 9.6 oz (90.5 kg)   SpO2 96%   BMI 38.98 kg/m   Wt Readings from Last 3 Encounters:  12/21/19 199 lb 9.6 oz (90.5 kg)  11/05/19 203 lb (92.1 kg)  10/13/19 205 lb (93 kg)    Physical Exam Vitals reviewed.  Constitutional:      General: She is not in acute distress.    Appearance: Normal appearance. She is well-developed and well-groomed. She is obese. She is not ill-appearing or toxic-appearing.  HENT:     Head: Normocephalic and atraumatic.     Nose:     Comments: Lizbeth Bark is in place, covering mouth and nose. Eyes:     General: Lids are normal. Vision grossly intact.  Right eye: No discharge.        Left eye: No discharge.     Extraocular Movements: Extraocular movements intact.     Conjunctiva/sclera: Conjunctivae normal.     Pupils: Pupils are equal, round, and reactive to light.  Cardiovascular:     Rate and Rhythm: Normal rate and regular rhythm.     Pulses: Normal pulses.          Dorsalis pedis pulses are 2+ on the right side and 2+ on the left side.     Heart sounds: Normal heart sounds. No murmur heard.  No friction rub. No gallop.   Pulmonary:     Effort: Pulmonary effort is normal. No respiratory distress.     Breath sounds: Normal breath sounds.  Musculoskeletal:     Right forearm: Normal.     Left forearm: Normal.     Right wrist: No swelling, deformity, effusion,  lacerations or tenderness. Normal range of motion. Normal pulse.     Left wrist: No swelling, deformity, effusion, lacerations or tenderness. Normal range of motion. Normal pulse.     Right hand: No swelling, deformity, lacerations or tenderness. Normal range of motion. Normal strength. Normal sensation. Normal capillary refill. Normal pulse.     Left hand: No swelling, deformity, lacerations or tenderness. Normal range of motion. Normal strength. Normal sensation. Normal capillary refill. Normal pulse.     Right lower leg: No edema.     Left lower leg: No edema.     Comments: + Phalens & Tinels bilaterally  Skin:    General: Skin is warm and dry.     Capillary Refill: Capillary refill takes less than 2 seconds.  Neurological:     General: No focal deficit present.     Mental Status: She is alert and oriented to person, place, and time.  Psychiatric:        Attention and Perception: Attention and perception normal.        Mood and Affect: Mood and affect normal.        Speech: Speech normal.        Behavior: Behavior normal. Behavior is cooperative.        Thought Content: Thought content normal.        Cognition and Memory: Cognition and memory normal.        Judgment: Judgment normal.     Results for orders placed or performed in visit on 09/17/19  HM MAMMOGRAPHY  Result Value Ref Range   HM Mammogram 0-4 Bi-Rad 0-4 Bi-Rad, Self Reported Normal  CBC with Differential  Result Value Ref Range   WBC 6.2 3.8 - 10.8 Thousand/uL   RBC 4.92 3.80 - 5.10 Million/uL   Hemoglobin 14.2 11.7 - 15.5 g/dL   HCT 44.0 35 - 45 %   MCV 89.4 80.0 - 100.0 fL   MCH 28.9 27.0 - 33.0 pg   MCHC 32.3 32.0 - 36.0 g/dL   RDW 13.5 11.0 - 15.0 %   Platelets 296 140 - 400 Thousand/uL   MPV 10.2 7.5 - 12.5 fL   Neutro Abs 3,714 1,500 - 7,800 cells/uL   Lymphs Abs 1,761 850 - 3,900 cells/uL   Absolute Monocytes 546 200 - 950 cells/uL   Eosinophils Absolute 149 15 - 500 cells/uL   Basophils Absolute 31 0  - 200 cells/uL   Neutrophils Relative % 59.9 %   Total Lymphocyte 28.4 %   Monocytes Relative 8.8 %   Eosinophils Relative 2.4 %   Basophils  Relative 0.5 %  COMPLETE METABOLIC PANEL WITH GFR  Result Value Ref Range   Glucose, Bld 101 (H) 65 - 99 mg/dL   BUN 15 7 - 25 mg/dL   Creat 0.69 0.50 - 0.99 mg/dL   GFR, Est Non African American 94 > OR = 60 mL/min/1.43m2   GFR, Est African American 109 > OR = 60 mL/min/1.62m2   BUN/Creatinine Ratio NOT APPLICABLE 6 - 22 (calc)   Sodium 140 135 - 146 mmol/L   Potassium 4.7 3.5 - 5.3 mmol/L   Chloride 104 98 - 110 mmol/L   CO2 26 20 - 32 mmol/L   Calcium 9.6 8.6 - 10.4 mg/dL   Total Protein 7.1 6.1 - 8.1 g/dL   Albumin 4.2 3.6 - 5.1 g/dL   Globulin 2.9 1.9 - 3.7 g/dL (calc)   AG Ratio 1.4 1.0 - 2.5 (calc)   Total Bilirubin 0.4 0.2 - 1.2 mg/dL   Alkaline phosphatase (APISO) 60 37 - 153 U/L   AST 19 10 - 35 U/L   ALT 19 6 - 29 U/L  Lipid Profile  Result Value Ref Range   Cholesterol 233 (H) <200 mg/dL   HDL 54 > OR = 50 mg/dL   Triglycerides 111 <150 mg/dL   LDL Cholesterol (Calc) 156 (H) mg/dL (calc)   Total CHOL/HDL Ratio 4.3 <5.0 (calc)   Non-HDL Cholesterol (Calc) 179 (H) <130 mg/dL (calc)  Thyroid Panel With TSH  Result Value Ref Range   T3 Uptake 27 22 - 35 %   T4, Total 7.4 5.1 - 11.9 mcg/dL   Free Thyroxine Index 2.0 1.4 - 3.8   TSH 1.11 0.40 - 4.50 mIU/L  HIV antibody (with reflex)  Result Value Ref Range   HIV 1&2 Ab, 4th Generation NON-REACTIVE NON-REACTI  Hepatitis C Antibody  Result Value Ref Range   Hepatitis C Ab NON-REACTIVE NON-REACTI   SIGNAL TO CUT-OFF 0.04 <1.00  Urinalysis, microscopic only  Result Value Ref Range   WBC, UA NONE SEEN 0 - 5 /HPF   RBC / HPF NONE SEEN 0 - 2 /HPF   Squamous Epithelial / LPF NONE SEEN < OR = 5 /HPF   Bacteria, UA NONE SEEN NONE SEEN /HPF   Hyaline Cast NONE SEEN NONE SEEN /LPF  POCT Urinalysis Dipstick  Result Value Ref Range   Color, UA Yellow    Clarity, UA clear     Glucose, UA Negative Negative   Bilirubin, UA negative    Ketones, UA negative    Spec Grav, UA 1.010 1.010 - 1.025   Blood, UA trace    pH, UA 5.0 5.0 - 8.0   Protein, UA Negative Negative   Urobilinogen, UA 0.2 0.2 or 1.0 E.U./dL   Nitrite, UA negative    Leukocytes, UA Trace (A) Negative   Appearance     Odor    POCT glycosylated hemoglobin (Hb A1C)  Result Value Ref Range   Hemoglobin A1C 5.7 (A) 4.0 - 5.6 %   HbA1c POC (<> result, manual entry)     HbA1c, POC (prediabetic range)     HbA1c, POC (controlled diabetic range)    HM COLONOSCOPY  Result Value Ref Range   HM Colonoscopy See Report (in chart) See Report (in chart), Patient Reported  Cytology - PAP  Result Value Ref Range   Amendment      AMENDMENT NOTE: DIAGNOSTIC INFORMATION HAS NOT BEEN CHANGED. Data was   Amendment      incorrectly entered or omitted  which prevented electronic transmittal of   Amendment      results into the EMR. This has been corrected. (initials:   Amendment t.coleman/date: 10/13/2019)    Adequacy      Satisfactory for evaluation; transformation zone component ABSENT.   Diagnosis      - Negative for intraepithelial lesion or malignancy (NILM)      Assessment & Plan:   Problem List Items Addressed This Visit      Cardiovascular and Mediastinum   Hypertension    Uncontrolled hypertension.  Reports BP at goal < 130/80 at home.  Pt is working on lifestyle modifications.  Taking medications tolerating well without side effects.  Complications: morbid obesity, GERD, hyperlipidemia  Plan: 1. Continue taking amlodipine 10mg  daily and losartan 50mg  daily 2. Obtain labs in 3 months 3. Encouraged heart healthy diet and increasing exercise to 30 minutes most days of the week, going no more than 2 days in a row without exercise. 4. Check BP 1-2 x per week at home, keep log, and bring to clinic at next appointment. 5. Follow up 3 months.         Relevant Medications   losartan (COZAAR) 50 MG  tablet   amLODipine (NORVASC) 10 MG tablet   atorvastatin (LIPITOR) 80 MG tablet     Digestive   GERD (gastroesophageal reflux disease)    Currently well controlled on omeprazole 40mg  once daily.  Plan: 1. Continue omeprazole 40mg  once daily. Side effects discussed. Pt wants to continue med. 2. Avoid diet triggers. Reviewed need to seek care if globus sensation, difficulty swallowing, s/sx of GI bleed. 3. Follow up as needed and in 3 months.       Relevant Medications   omeprazole (PRILOSEC) 40 MG capsule     Nervous and Auditory   Carpal tunnel syndrome    Bilateral carpal tunnel x 3 months with worsening of symptoms over the past 2 weeks.  Has bought OTC cock-up braces to wear, which has slightly helped decrease symptoms.  Discussed referral to orthopedics, may rx occupational therapy, EMG, steroid injections and/or surgery.  Patient in agreement with orthopedics referral.  Plan: 1. Referral to orthopedics placed      Relevant Medications   PARoxetine (PAXIL) 10 MG tablet   Other Relevant Orders   AMB referral to orthopedics     Other   Depression    GAD7-2/PHQ9-6.  Currently taking paroxetine 10mg  daily and tolerating well.  Reviewed mood handout.   Plan: 1. Continue paroxetine 10mg  daily 2. Review mood handout 3. RTC in 3 months      Relevant Medications   PARoxetine (PAXIL) 10 MG tablet   Hyperlipidemia - Primary    Has continued to take atorvastatin 80mg  and tolerating well.  We will plan to recheck lipid labs in the next 3 months for re-evaluation.  Plan: 1. Continue atorvastatin 80mg  daily 2. Recheck labs in 3 months      Relevant Medications   losartan (COZAAR) 50 MG tablet   amLODipine (NORVASC) 10 MG tablet   atorvastatin (LIPITOR) 80 MG tablet   Seasonal allergies    Seasonal allergies currently stable and well controlled with fluticasone 1 spray twice per day.  Plan: 1. Continue fluticasone 1 spray twice per day      Relevant Medications    fluticasone (FLONASE) 50 MCG/ACT nasal spray      Meds ordered this encounter  Medications  . losartan (COZAAR) 50 MG tablet    Sig: Take 1 tablet (50  mg total) by mouth daily.    Dispense:  90 tablet    Refill:  1  . omeprazole (PRILOSEC) 40 MG capsule    Sig: Take 1 capsule (40 mg total) by mouth daily as needed.    Dispense:  90 capsule    Refill:  1  . amLODipine (NORVASC) 10 MG tablet    Sig: Take 1 tablet (10 mg total) by mouth daily.    Dispense:  90 tablet    Refill:  1  . atorvastatin (LIPITOR) 80 MG tablet    Sig: Take 1 tablet (80 mg total) by mouth daily.    Dispense:  90 tablet    Refill:  1  . fluticasone (FLONASE) 50 MCG/ACT nasal spray    Sig: Place 1 spray into both nostrils 2 (two) times daily.    Dispense:  9.9 mL    Refill:  1  . PARoxetine (PAXIL) 10 MG tablet    Sig: Take 1 tablet (10 mg total) by mouth daily.    Dispense:  90 tablet    Refill:  1      Follow up plan: Return in about 3 months (around 03/22/2020) for HTN and Prediabetes f/u visit.   Harlin Rain, [place] Family Nurse Practitioner [facility] 12/21/2019, 1:33 PM

## 2019-12-22 ENCOUNTER — Ambulatory Visit: Payer: Medicare Other | Admitting: Family Medicine

## 2019-12-29 ENCOUNTER — Other Ambulatory Visit: Payer: Self-pay

## 2019-12-29 ENCOUNTER — Ambulatory Visit
Payer: Medicare Other | Attending: Student in an Organized Health Care Education/Training Program | Admitting: Student in an Organized Health Care Education/Training Program

## 2019-12-29 ENCOUNTER — Encounter: Payer: Self-pay | Admitting: Student in an Organized Health Care Education/Training Program

## 2019-12-29 ENCOUNTER — Telehealth: Payer: Self-pay | Admitting: *Deleted

## 2019-12-29 VITALS — BP 158/89 | HR 80 | Temp 97.5°F | Resp 16 | Ht 59.0 in | Wt 199.0 lb

## 2019-12-29 DIAGNOSIS — M25551 Pain in right hip: Secondary | ICD-10-CM | POA: Diagnosis not present

## 2019-12-29 DIAGNOSIS — Z96652 Presence of left artificial knee joint: Secondary | ICD-10-CM | POA: Diagnosis not present

## 2019-12-29 DIAGNOSIS — G894 Chronic pain syndrome: Secondary | ICD-10-CM | POA: Diagnosis not present

## 2019-12-29 DIAGNOSIS — G950 Syringomyelia and syringobulbia: Secondary | ICD-10-CM | POA: Diagnosis not present

## 2019-12-29 DIAGNOSIS — M170 Bilateral primary osteoarthritis of knee: Secondary | ICD-10-CM | POA: Diagnosis not present

## 2019-12-29 DIAGNOSIS — G8929 Other chronic pain: Secondary | ICD-10-CM | POA: Insufficient documentation

## 2019-12-29 DIAGNOSIS — M25552 Pain in left hip: Secondary | ICD-10-CM | POA: Insufficient documentation

## 2019-12-29 DIAGNOSIS — M47817 Spondylosis without myelopathy or radiculopathy, lumbosacral region: Secondary | ICD-10-CM | POA: Diagnosis not present

## 2019-12-29 DIAGNOSIS — M4714 Other spondylosis with myelopathy, thoracic region: Secondary | ICD-10-CM | POA: Insufficient documentation

## 2019-12-29 DIAGNOSIS — M797 Fibromyalgia: Secondary | ICD-10-CM

## 2019-12-29 DIAGNOSIS — M545 Low back pain, unspecified: Secondary | ICD-10-CM

## 2019-12-29 MED ORDER — HYDROCODONE-ACETAMINOPHEN 5-325 MG PO TABS: 1.0000 | ORAL_TABLET | Freq: Three times a day (TID) | ORAL | 0 refills | Status: AC | PRN

## 2019-12-29 NOTE — Progress Notes (Signed)
PROVIDER NOTE: Information contained herein reflects review and annotations entered in association with encounter. Interpretation of such information and data should be left to medically-trained personnel. Information provided to patient can be located elsewhere in the medical record under "Patient Instructions". Document created using STT-dictation technology, any transcriptional errors that may result from process are unintentional.    Patient: Kristin Porter  Service Category: E/M  Provider: Gillis Santa, MD  DOB: Sep 26, 1960  DOS: 12/29/2019  Specialty: Interventional Pain Management  MRN: [id number]  Setting: Ambulatory outpatient  PCP: Verl Bangs, FNP  Type: Established Patient    Referring Provider: Verl Bangs, FNP  Location: Office  Delivery: Face-to-face     HPI  Reason for encounter: Kristin Porter, a 59 y.o. year old female, is here today for evaluation and management of her Fibromyalgia [M79.7]. Kristin Porter primary complain today is No chief complaint on file. Last encounter: Practice (11/11/2019). My last encounter with her was on 11/05/2019. Pertinent problems: Kristin Porter has Chronic pain of both hips; Depression; Fibromyalgia; History of total knee arthroplasty, left; Lumbosacral spondylosis without myelopathy; Multiple joint pain; OA (osteoarthritis) of knee; and Chronic pain syndrome on their pertinent problem list. Pain Assessment: Severity of Chronic pain, Neuropathic pain is reported as a 7 /10. Location: Back Lower/Radiating aspect of pain is NEW - through hips, down legs to feet. Onset: More than a month ago. Quality: Numbness, Tingling, Aching, Burning, Throbbing, Dull, Sharp, Shooting. Timing: Constant. Modifying factor(s): nothing. Vitals:  height is _0  (1.499 m) and weight is 199 lb (90.3 kg). Her temporal temperature is 97.5 F (36.4 C) (abnormal). Her blood pressure is 158/89 (abnormal) and her pulse is 80. Her respiration is 16 and oxygen saturation  is 99%.   Follows up today for medication management.  At last visit her Butrans patch was increased from 5 to 7.5 mcg an hour.  She is not endorsing any significant benefit with dose increase.  She has tried tramadol in the past which was not effective.  Discussed transitioning to low-dose hydrocodone as needed for pain flare.  Risks and benefits reviewed and patient would like to trial.  Pharmacotherapy Assessment   Analgesic: No significant analgesic benefit to Butrans patch at 5 mcg an hour, 7.5 mcg an hour.  We will transition to hydrocodone as below   Monitoring: [place] PMP: PDMP not reviewed this encounter.       Pharmacotherapy: No side-effects or adverse reactions reported. Compliance: No problems identified. Effectiveness: Not effective  Rise Patience, RN  12/29/2019  1:50 PM  Sign when Signing Visit Nursing Pain Medication Assessment:  Safety precautions to be maintained throughout the outpatient stay will include: orient to surroundings, keep bed in low position, maintain call bell within reach at all times, provide assistance with transfer out of bed and ambulation.  Medication Inspection Compliance: Pill count conducted under aseptic conditions, in front of the patient. Neither the pills nor the bottle was removed from the patient's sight at any time. Once count was completed pills were immediately returned to the patient in their original bottle.  Medication: Buprenorphine (Suboxone) Pill/Patch Count: 1 of 4 patches remain Pill/Patch Appearance: Markings consistent with prescribed medication Bottle Appearance: Standard pharmacy container. Clearly labeled. Filled Date: 07 / 21 / 2021 Last Medication intake:  12/23/2019    UDS:  Summary  Date Value Ref Range Status  09/10/2019 Note  Final    Comment:    ==================================================================== Compliance Drug Analysis,  Ur ==================================================================== Specimen Alert Note:  Urinary creatinine is low; ability to detect some drugs may be compromised. Interpret results with caution. (Creatinine) ==================================================================== Test                             Result       Flag       Units Drug Present and Declared for Prescription Verification   Tramadol                       2744         EXPECTED   ng/mg creat   O-Desmethyltramadol            3256         EXPECTED   ng/mg creat    Source of tramadol is a prescription medication. O-desmethyltramadol    is an expected metabolite of tramadol. Drug Present not Declared for Prescription Verification   Acetaminophen                  PRESENT      UNEXPECTED   Diphenhydramine                PRESENT      UNEXPECTED Drug Absent but Declared for Prescription Verification   Pregabalin                     Not Detected UNEXPECTED   Baclofen                       Not Detected UNEXPECTED   Amitriptyline                  Not Detected UNEXPECTED   Paroxetine                     Not Detected UNEXPECTED   Trazodone                      Not Detected UNEXPECTED   Diclofenac                     Not Detected UNEXPECTED    Topical diclofenac, as indicated in the declared medication list, is    not always detected even when used as directed. ==================================================================== Test                      Result    Flag   Units      Ref Range   Creatinine              16        LL     mg/dL      >=20 ==================================================================== Declared Medications:  The flagging and interpretation on this report are based on the  following declared medications.  Unexpected results may arise from  inaccuracies in the declared medications.  **Note: The testing scope of this panel includes these medications:  Amitriptyline  Baclofen  Paroxetine   Pregabalin  Tramadol  Trazodone  **Note: The testing scope of this panel does not include small to  moderate amounts of these reported medications:  Topical Diclofenac (Flector)  **Note: The testing scope of this panel does not include the  following reported medications:  Albuterol  Amlodipine  Atorvastatin  Fluticasone (Flonase)  Losartan  Meloxicam  Naloxone  Omeprazole  Undefined Miscellaneous Drug ==================================================================== For clinical consultation, please call [phone]. ====================================================================  ROS  Constitutional: Denies any fever or chills Gastrointestinal: No reported hemesis, hematochezia, vomiting, or acute GI distress Musculoskeletal: Low back pain, polyarthralgias, polymyalgias Neurological: No reported episodes of acute onset apraxia, aphasia, dysarthria, agnosia, amnesia, paralysis, loss of coordination, or loss of consciousness  Medication Review  Blood Pressure Kit, HYDROcodone-acetaminophen, PARoxetine, albuterol, amLODipine, amitriptyline, atorvastatin, baclofen, fluticasone, losartan, meloxicam, naloxone, omeprazole, pregabalin, and zolpidem  History Review  Allergy: Kristin Porter is allergic to tizanidine, lisinopril, and tramadol. Drug: Kristin Porter  reports no history of drug use. Alcohol:  reports previous alcohol use. Tobacco:  reports that she quit smoking about 51 years ago. She has never used smokeless tobacco. Social: Kristin Porter  reports that she quit smoking about 51 years ago. She has never used smokeless tobacco. She reports previous alcohol use. She reports that she does not use drugs. Medical:  has a past medical history of Arthritis, Fibromyalgia, Hypertension, and Neuropathy. Surgical: Kristin Porter  has no past surgical history on file. Family: Family history is unknown by patient.  Laboratory Chemistry Profile   Renal Lab Results  Component  Value Date   BUN 15 09/17/2019   CREATININE 0.69 71/69/6789   BCR NOT APPLICABLE 38/02/1750   GFRAA 109 09/17/2019   GFRNONAA 94 09/17/2019     Hepatic Lab Results  Component Value Date   AST 19 09/17/2019   ALT 19 09/17/2019     Electrolytes Lab Results  Component Value Date   NA 140 09/17/2019   K 4.7 09/17/2019   CL 104 09/17/2019   CALCIUM 9.6 09/17/2019     Bone No results found for: VD25OH, VD125OH2TOT, WC5852DP8, EU2353IR4, 25OHVITD1, 25OHVITD2, 25OHVITD3, TESTOFREE, TESTOSTERONE   Inflammation (CRP: Acute Phase) (ESR: Chronic Phase) No results found for: CRP, ESRSEDRATE, LATICACIDVEN     Note: Above Lab results reviewed.  Recent Imaging Review  DG Bone Density EXAM: DUAL X-RAY ABSORPTIOMETRY (DXA) FOR BONE MINERAL DENSITY  IMPRESSION: crr  Your patient Kristin Porter completed a BMD test on 09/28/2019 using the [place] (analysis version: 14.10) manufactured by [employer]. The following summarizes the results of our evaluation.  PATIENT BIOGRAPHICAL: Name: Kristin Porter, Kristin Porter Patient ID: [id number] Birth Date: 1960-05-30  Height:     59.0 in. Gender: Female Exam Date: 09/28/2019  Weight:     208.2 lbs. Indications: arthritis, lt knee replacement, POSTmenopausal Fractures:  Treatments: aspirin, Calcium, lyrica, multivitamin  ASSESSMENT: The BMD measured at Femur Neck Right is 0.827 g/cm2 with a T-score of -1.5.  This patient is considered OSTEOPENIC according to [facility]) criteria.  The scan quality is good.  Site Region Measured Measured WHO Young Adult BMD Date       Age      Classification T-score  AP Spine L1-L4 09/28/2019 61.3 Normal 0.2 1.201 g/cm2  DualFemur Neck Right 09/28/2019 61.3 Osteopenia -1.5 0.827 g/cm2  World Health Organization [facility]) criteria for post-menopausal, Caucasian Women: Normal:       T-score at or above -1 SD Osteopenia:   T-score between -1 and -2.5  SD Osteoporosis: T-score at or below -2.5 SD  RECOMMENDATIONS: 1. All patients should optimize calcium and vitamin D intake. 2. Consider FDA-approved medical therapies in postmenopausal women and men aged 83 years and older, based on the following: a. A hip or vertebral (clinical or morphometric) fracture b. T-score < -2.5 at the femoral neck or spine after appropriate evaluation to exclude secondary causes c. Low bone mass (T-score between -1.0 and -2.5 at the femoral  neck or spine) and a 10-year probability of a hip fracture > 3% or a 10-year probability of a major osteoporosis-related fracture > 20% based on the [place]-adapted WHO algorithm d. Clinician judgment and/or patient preferences may indicate treatment for people with 10-year fracture probabilities above or below these levels  FOLLOW-UP: People with diagnosed cases of osteoporosis or at high risk for fracture should have regular bone mineral density tests. For patients eligible for Medicare, routine testing is allowed once every 2 years. The testing frequency can be increased to one year for patients who have rapidly progressing disease, those who are receiving or discontinuing medical therapy to restore bone mass, or have additional risk factors.  I have reviewed this report, and agree with the above findings.  Mark A. Thornton Papas, M.D. [facility] crr  Your patient Kristin Porter completed a FRAX assessment on 09/28/2019 using the [place] (analysis version: 14.10) manufactured by [employer]. The following summarizes the results of our evaluation.  PATIENT BIOGRAPHICAL: Name: Kristin Porter, Kristin Porter Patient ID: [id number] Birth Date: February 12, 1961 Height:    59.0 in. Gender:     Female    Age:        59.3       Weight:    208.2 lbs. Ethnicity:  Black                            Exam Date: 09/28/2019  FRAX* RESULTS:  (version: 3.5) 10-year Probability of Fracture1 Major Osteoporotic  Fracture2 Hip Fracture 3.3% 0.3% Population: [place] (Black) Risk Factors: None  Based on Femur (Right) Neck BMD  1 -The 10-year probability of fracture may be lower than reported if the patient has received treatment. 2 -Major Osteoporotic Fracture: Clinical Spine, Forearm, Hip or Shoulder  *FRAX is a Materials engineer of the [employer] for Metabolic Bone Disease, a [place] (WHO) [facility].  ASSESSMENT: The probability of a major osteoporotic fracture is 3.3% within the next ten years. The probability of a hip fracture is 0.3% within the next ten years.  I have reviewed this report and agree with the above findings.  Mark A. Thornton Papas, M.D.  [facility]  Electronically Signed   By: Lavonia Dana M.D.   On: 09/28/2019 14:38 Note: Reviewed        Physical Exam  General appearance: Well nourished, well developed, and well hydrated. In no apparent acute distress Mental status: Alert, oriented x 3 (person, place, & time)       Respiratory: No evidence of acute respiratory distress Eyes: PERLA Vitals: BP (!) 158/89   Pulse 80   Temp (!) 97.5 F (36.4 C) (Temporal)   Resp 16   Ht _0  (1.499 m)   Wt 199 lb (90.3 kg)   SpO2 99%   BMI 40.19 kg/m  BMI: Estimated body mass index is 40.19 kg/m as calculated from the following:   Height as of this encounter: _1  (1.499 m).   Weight as of this encounter: 199 lb (90.3 kg). Ideal: Patient must be at least 60 in tall to calculate ideal body weight  Lumbar Spine Area Exam  Skin & Axial Inspection: No masses, redness, or swelling Alignment: Symmetrical Functional ROM: Pain restricted ROM affecting both sides Stability: No instability detected Muscle Tone/Strength: Functionally intact. No obvious neuro-muscular anomalies detected. Sensory (Neurological): Musculoskeletal pain pattern  Gait & Posture Assessment  Ambulation: Unassisted Gait: Relatively normal  for age and body habitus Posture: WNL   Lower Extremity Exam    Side: Right lower extremity  Side: Left lower extremity  Stability: No instability observed          Stability: No instability observed          Skin & Extremity Inspection: Skin color, temperature, and hair growth are WNL. No peripheral edema or cyanosis. No masses, redness, swelling, asymmetry, or associated skin lesions. No contractures.  Skin & Extremity Inspection: Skin color, temperature, and hair growth are WNL. No peripheral edema or cyanosis. No masses, redness, swelling, asymmetry, or associated skin lesions. No contractures.  Functional ROM: Unrestricted ROM                  Functional ROM: Unrestricted ROM                  Muscle Tone/Strength: Functionally intact. No obvious neuro-muscular anomalies detected.  Muscle Tone/Strength: Functionally intact. No obvious neuro-muscular anomalies detected.  Sensory (Neurological): Musculoskeletal pain pattern        Sensory (Neurological): Musculoskeletal pain pattern        DTR: Patellar: deferred today Achilles: deferred today Plantar: deferred today  DTR: Patellar: deferred today Achilles: deferred today Plantar: deferred today  Palpation: No palpable anomalies  Palpation: No palpable anomalies     Assessment   Status Diagnosis  Persistent Persistent Persistent 1. Fibromyalgia   2. Thoracic myelopathy (due to syrinx, s/p excision 12/23/2017)   3. Chronic bilateral low back pain without sciatica   4. Syrinx of spinal cord ([place])   5. History of total knee arthroplasty, left   6. Chronic pain of both hips   7. Lumbosacral spondylosis without myelopathy   8. Primary osteoarthritis of both knees   9. Chronic pain syndrome      Updated Problems: Problem  Chronic Pain Syndrome   Multifactorial related to thoracic myelopathy status post syrinx excision on August 2019, history of total knee arthroplasty on the left with lumbosacral spondylosis,  osteoarthritis of both knees, fibromyalgia.  Has tried and failed tramadol, Butrans at 5 mcg, 7.5 mcg.     Plan of Care  Problem-specific:  Chronic pain syndrome Requested Prescriptions   Signed Prescriptions Disp Refills  . HYDROcodone-acetaminophen (NORCO/VICODIN) 5-325 MG tablet 90 tablet 0    Sig: Take 1 tablet by mouth 3 (three) times daily as needed for severe pain. Must last 30 days.   Continue multimodal analgesics with Lyrica and amitriptyline as prescribed.  No refills needed.  Kristin Porter has a current medication list which includes the following long-term medication(s): albuterol, amitriptyline, amlodipine, atorvastatin, fluticasone, losartan, omeprazole, paroxetine, pregabalin, and zolpidem.  Pharmacotherapy (Medications Ordered): Meds ordered this encounter  Medications  . HYDROcodone-acetaminophen (NORCO/VICODIN) 5-325 MG tablet    Sig: Take 1 tablet by mouth 3 (three) times daily as needed for severe pain. Must last 30 days.    Dispense:  90 tablet    Refill:  0    Chronic Pain. (STOP Act - Not applicable). Fill one day early if closed on scheduled refill date.   Follow-up plan:   Return in about 4 weeks (around 01/26/2020) for Medication Management, in person.   Recent Visits Date Type Provider Dept  11/05/19 Office Visit Gillis Santa, MD [facility]  10/07/19 Office Visit Gillis Santa, MD [facility]  Showing recent visits within past 90 days and meeting all other requirements Today's Visits Date Type Provider Dept  12/29/19 Office Visit Holley Raring,  Carlus Pavlov, MD [facility]  Showing today's visits and meeting all other requirements Future Appointments Date Type Provider Dept  01/27/20 Appointment Gillis Santa, MD [facility]  Showing future appointments within next 90 days and meeting all other requirements  I discussed the assessment and treatment plan with the patient. The patient was provided an opportunity  to ask questions and all were answered. The patient agreed with the plan and demonstrated an understanding of the instructions.  Patient advised to call back or seek an in-person evaluation if the symptoms or condition worsens.  Duration of encounter: 30 minutes.  Note by: Gillis Santa, MD Date: 12/29/2019; Time: 3:38 PM

## 2019-12-29 NOTE — Telephone Encounter (Signed)
Done. Dr. Holley Raring spoke with pharmacist.

## 2019-12-29 NOTE — Assessment & Plan Note (Addendum)
Requested Prescriptions   Signed Prescriptions Disp Refills  . HYDROcodone-acetaminophen (NORCO/VICODIN) 5-325 MG tablet 90 tablet 0    Sig: Take 1 tablet by mouth 3 (three) times daily as needed for severe pain. Must last 30 days.   Continue multimodal analgesics with Lyrica and amitriptyline as prescribed.  No refills needed.

## 2019-12-29 NOTE — Patient Instructions (Signed)
Hydrocodone/APAP to last until 01/28/2020 has been escribed to your pharmacy.

## 2019-12-29 NOTE — Progress Notes (Signed)
Nursing Pain Medication Assessment:  Safety precautions to be maintained throughout the outpatient stay will include: orient to surroundings, keep bed in low position, maintain call bell within reach at all times, provide assistance with transfer out of bed and ambulation.  Medication Inspection Compliance: Pill count conducted under aseptic conditions, in front of the patient. Neither the pills nor the bottle was removed from the patient's sight at any time. Once count was completed pills were immediately returned to the patient in their original bottle.  Medication: Buprenorphine (Suboxone) Pill/Patch Count: 1 of 4 patches remain Pill/Patch Appearance: Markings consistent with prescribed medication Bottle Appearance: Standard pharmacy container. Clearly labeled. Filled Date: 07 / 21 / 2021 Last Medication intake:  12/23/2019

## 2020-01-13 ENCOUNTER — Telehealth: Payer: Self-pay

## 2020-01-13 NOTE — Telephone Encounter (Signed)
Copied from [place] [id number]. Topic: Referral - Request for Referral >> Jan 11, 2020 12:19 PM Gillis Ends D wrote: Has patient seen PCP for this complaint? Yes.   *If NO, is insurance requiring patient see PCP for this issue before PCP can refer them? Referral for which specialty: Orthopedics Preferred provider/office: [facility] Fax Number for referral is [phone] Reason for referral:Orthopedics     Referral refaxed over to [facility].

## 2020-01-20 DIAGNOSIS — Z96652 Presence of left artificial knee joint: Secondary | ICD-10-CM | POA: Diagnosis not present

## 2020-01-20 DIAGNOSIS — M65342 Trigger finger, left ring finger: Secondary | ICD-10-CM | POA: Diagnosis not present

## 2020-01-20 DIAGNOSIS — M461 Sacroiliitis, not elsewhere classified: Secondary | ICD-10-CM | POA: Diagnosis not present

## 2020-01-20 DIAGNOSIS — Z471 Aftercare following joint replacement surgery: Secondary | ICD-10-CM | POA: Diagnosis not present

## 2020-01-20 DIAGNOSIS — M160 Bilateral primary osteoarthritis of hip: Secondary | ICD-10-CM | POA: Diagnosis not present

## 2020-01-27 ENCOUNTER — Encounter: Payer: Medicare Other | Admitting: Student in an Organized Health Care Education/Training Program

## 2020-01-27 DIAGNOSIS — M79645 Pain in left finger(s): Secondary | ICD-10-CM | POA: Diagnosis not present

## 2020-01-27 DIAGNOSIS — M65342 Trigger finger, left ring finger: Secondary | ICD-10-CM | POA: Diagnosis not present

## 2020-02-03 DIAGNOSIS — H43812 Vitreous degeneration, left eye: Secondary | ICD-10-CM | POA: Diagnosis not present

## 2020-02-04 ENCOUNTER — Other Ambulatory Visit: Payer: Self-pay

## 2020-02-04 ENCOUNTER — Ambulatory Visit
Admission: RE | Admit: 2020-02-04 | Discharge: 2020-02-04 | Disposition: A | Discharge status: Home / Self Care | Payer: Medicare Other | Source: Ambulatory Visit | Attending: Family Medicine | Admitting: Family Medicine

## 2020-02-04 DIAGNOSIS — Z1231 Encounter for screening mammogram for malignant neoplasm of breast: Secondary | ICD-10-CM | POA: Diagnosis not present

## 2020-02-08 ENCOUNTER — Inpatient Hospital Stay
Admission: RE | Admit: 2020-02-08 | Discharge: 2020-02-08 | Disposition: A | Discharge status: Home / Self Care | Payer: Self-pay | Source: Ambulatory Visit | Attending: *Deleted | Admitting: *Deleted

## 2020-02-08 ENCOUNTER — Other Ambulatory Visit: Payer: Self-pay | Admitting: *Deleted

## 2020-02-08 DIAGNOSIS — Z1231 Encounter for screening mammogram for malignant neoplasm of breast: Secondary | ICD-10-CM

## 2020-02-09 DIAGNOSIS — M461 Sacroiliitis, not elsewhere classified: Secondary | ICD-10-CM | POA: Diagnosis not present

## 2020-02-09 DIAGNOSIS — M533 Sacrococcygeal disorders, not elsewhere classified: Secondary | ICD-10-CM | POA: Diagnosis not present

## 2020-02-18 ENCOUNTER — Encounter: Payer: Self-pay | Admitting: Student in an Organized Health Care Education/Training Program

## 2020-02-18 ENCOUNTER — Ambulatory Visit
Payer: Medicare Other | Attending: Student in an Organized Health Care Education/Training Program | Admitting: Student in an Organized Health Care Education/Training Program

## 2020-02-18 ENCOUNTER — Other Ambulatory Visit: Payer: Self-pay

## 2020-02-18 VITALS — BP 142/97 | HR 79 | Temp 97.9°F | Resp 18 | Ht 59.0 in | Wt 195.0 lb

## 2020-02-18 DIAGNOSIS — M47817 Spondylosis without myelopathy or radiculopathy, lumbosacral region: Secondary | ICD-10-CM | POA: Insufficient documentation

## 2020-02-18 DIAGNOSIS — M545 Low back pain, unspecified: Secondary | ICD-10-CM

## 2020-02-18 DIAGNOSIS — G8929 Other chronic pain: Secondary | ICD-10-CM | POA: Diagnosis not present

## 2020-02-18 DIAGNOSIS — M25551 Pain in right hip: Secondary | ICD-10-CM | POA: Diagnosis not present

## 2020-02-18 DIAGNOSIS — Z96652 Presence of left artificial knee joint: Secondary | ICD-10-CM | POA: Diagnosis not present

## 2020-02-18 DIAGNOSIS — M797 Fibromyalgia: Secondary | ICD-10-CM | POA: Diagnosis not present

## 2020-02-18 DIAGNOSIS — M170 Bilateral primary osteoarthritis of knee: Secondary | ICD-10-CM | POA: Diagnosis not present

## 2020-02-18 DIAGNOSIS — M4714 Other spondylosis with myelopathy, thoracic region: Secondary | ICD-10-CM

## 2020-02-18 DIAGNOSIS — G894 Chronic pain syndrome: Secondary | ICD-10-CM | POA: Insufficient documentation

## 2020-02-18 DIAGNOSIS — M25552 Pain in left hip: Secondary | ICD-10-CM | POA: Diagnosis not present

## 2020-02-18 DIAGNOSIS — G950 Syringomyelia and syringobulbia: Secondary | ICD-10-CM | POA: Diagnosis not present

## 2020-02-18 MED ORDER — HYDROCODONE-ACETAMINOPHEN 7.5-325 MG PO TABS: 1.0000 | ORAL_TABLET | Freq: Two times a day (BID) | ORAL | 0 refills | Status: AC | PRN

## 2020-02-18 NOTE — Progress Notes (Signed)
PROVIDER NOTE: Information contained herein reflects review and annotations entered in association with encounter. Interpretation of such information and data should be left to medically-trained personnel. Information provided to patient can be located elsewhere in the medical record under "Patient Instructions". Document created using STT-dictation technology, any transcriptional errors that may result from process are unintentional.    Patient: Kristin Porter  Service Category: E/M  Provider: Gillis Santa, MD  DOB: 10/26/1960  DOS: 02/18/2020  Specialty: Interventional Pain Management  MRN: [id number]  Setting: Ambulatory outpatient  PCP: Verl Bangs, FNP  Type: Established Patient    Referring Provider: Verl Bangs, FNP  Location: Office  Delivery: Face-to-face     HPI  Reason for encounter: [name], a 59 y.o. year old female, is here today for evaluation and management of her Fibromyalgia [M79.7]. Kristin Porter primary complain today is Back Pain (mid) and Neck Pain (headaches) Last encounter: Practice (12/29/2019). My last encounter with her was on 12/29/2019. Pertinent problems: Kristin Porter has Chronic pain of both hips; Depression; Fibromyalgia; History of total knee arthroplasty, left; Lumbosacral spondylosis without myelopathy; Multiple joint pain; OA (osteoarthritis) of knee; and Chronic pain syndrome on their pertinent problem list. Pain Assessment: Severity of Chronic pain is reported as a 8 /10. Location: Back Mid/up to neck/headaches. Onset: More than a month ago. Quality: Aching, Constant, Grimacing, Discomfort. Timing: Constant. Modifying factor(s): standing against a wall working on posture, heating pad,. Vitals:  height is '4\' 11"'  (1.499 m) and weight is 195 lb (88.5 kg). Her temperature is 97.9 F (36.6 C). Her blood pressure is 142/97 (abnormal) and her pulse is 79. Her respiration is 18 and oxygen saturation is 100%.   Patient follows up today for  medication management.  At her last visit she was transition from buprenorphine to hydrocodone 5 mg 3 times daily as needed.  While she is endorsing superior analgesic benefit with hydrocodone compared to buprenorphine, she is endorsing side effects of pruritus especially with her third dose.  She gets pain relief for approximately 4 to 5 hours.  As long as she takes 2 tablets a day, she states that the pruritus is manageable however if she does take 3rd  tablet, it becomes severe.  She is utilizing Benadryl for the pruritus which is somewhat helpful.  We discussed transitioning her frequency interval in dose to 7.5 mg twice daily as needed which will hopefully provide increased analgesic benefit and will hopefully allow [place] to avoid a third dose.  Of note patient did have a right sacroiliac joint injection on 02/09/2019.  She was reminded that she has a pain contract at this clinic and if any interventions are considered while she is on chronic opioid therapy, it is recommended that she has them done here.  Patient endorsed understanding.  Pharmacotherapy Assessment   Analgesic: Hydrocodone 5 mg 3 times daily as needed, quantity 90/month; MME equals 15  Monitoring: [place] PMP: PDMP not reviewed this encounter.       Pharmacotherapy: No side-effects or adverse reactions reported. Compliance: No problems identified. Effectiveness: Clinically acceptable.  No notes on file  UDS:  Summary  Date Value Ref Range Status  09/10/2019 Note  Final    Comment:    ==================================================================== Compliance Drug Analysis, Ur ==================================================================== Specimen Alert Note: Urinary creatinine is low; ability to detect some drugs may be compromised. Interpret results with caution. (Creatinine) ==================================================================== Test  Result       Flag       Units Drug  Present and Declared for Prescription Verification   Tramadol                       2744         EXPECTED   ng/mg creat   O-Desmethyltramadol            3256         EXPECTED   ng/mg creat    Source of tramadol is a prescription medication. O-desmethyltramadol    is an expected metabolite of tramadol. Drug Present not Declared for Prescription Verification   Acetaminophen                  PRESENT      UNEXPECTED   Diphenhydramine                PRESENT      UNEXPECTED Drug Absent but Declared for Prescription Verification   Pregabalin                     Not Detected UNEXPECTED   Baclofen                       Not Detected UNEXPECTED   Amitriptyline                  Not Detected UNEXPECTED   Paroxetine                     Not Detected UNEXPECTED   Trazodone                      Not Detected UNEXPECTED   Diclofenac                     Not Detected UNEXPECTED    Topical diclofenac, as indicated in the declared medication list, is    not always detected even when used as directed. ==================================================================== Test                      Result    Flag   Units      Ref Range   Creatinine              16        LL     mg/dL      >=20 ==================================================================== Declared Medications:  The flagging and interpretation on this report are based on the  following declared medications.  Unexpected results may arise from  inaccuracies in the declared medications.  **Note: The testing scope of this panel includes these medications:  Amitriptyline  Baclofen  Paroxetine  Pregabalin  Tramadol  Trazodone  **Note: The testing scope of this panel does not include small to  moderate amounts of these reported medications:  Topical Diclofenac (Flector)  **Note: The testing scope of this panel does not include the  following reported medications:  Albuterol  Amlodipine  Atorvastatin  Fluticasone (Flonase)  Losartan   Meloxicam  Naloxone  Omeprazole  Undefined Miscellaneous Drug ==================================================================== For clinical consultation, please call [phone]. ====================================================================      We will change to hydrocodone 7.5 mg twice daily as needed, quantity 60/month  ROS  Constitutional: Denies any fever or chills Gastrointestinal: No reported hemesis, hematochezia, vomiting, or acute GI distress Musculoskeletal: Mid back and low back pain Neurological:  No reported episodes of acute onset apraxia, aphasia, dysarthria, agnosia, amnesia, paralysis, loss of coordination, or loss of consciousness  Medication Review  Blood Pressure Kit, HYDROcodone-acetaminophen, PARoxetine, albuterol, amLODipine, amitriptyline, atorvastatin, baclofen, fluticasone, losartan, meloxicam, naloxone, omeprazole, pregabalin, and zolpidem  History Review  Allergy: Kristin Porter is allergic to tizanidine, lisinopril, and tramadol. Drug: Kristin Porter  reports no history of drug use. Alcohol:  reports previous alcohol use. Tobacco:  reports that she quit smoking about 51 years ago. She has never used smokeless tobacco. Social: Kristin Porter  reports that she quit smoking about 51 years ago. She has never used smokeless tobacco. She reports previous alcohol use. She reports that she does not use drugs. Medical:  has a past medical history of Arthritis, Fibromyalgia, Hypertension, and Neuropathy. Surgical: Kristin Porter  has a past surgical history that includes Breast biopsy (Right, 2018). Family: Family history is unknown by patient.  Laboratory Chemistry Profile   Renal Lab Results  Component Value Date   BUN 15 09/17/2019   CREATININE 0.69 03/50/0938   BCR NOT APPLICABLE 18/29/9371   GFRAA 109 09/17/2019   GFRNONAA 94 09/17/2019     Hepatic Lab Results  Component Value Date   AST 19 09/17/2019   ALT 19 09/17/2019     Electrolytes Lab  Results  Component Value Date   NA 140 09/17/2019   K 4.7 09/17/2019   CL 104 09/17/2019   CALCIUM 9.6 09/17/2019     Bone No results found for: VD25OH, VD125OH2TOT, IR6789FY1, OF7510CH8, 25OHVITD1, 25OHVITD2, 25OHVITD3, TESTOFREE, TESTOSTERONE   Inflammation (CRP: Acute Phase) (ESR: Chronic Phase) No results found for: CRP, ESRSEDRATE, LATICACIDVEN     Note: Above Lab results reviewed.  Recent Imaging Review  MM 3D SCREEN BREAST BILATERAL CLINICAL DATA:  Screening.  EXAM: DIGITAL SCREENING BILATERAL MAMMOGRAM WITH TOMO AND CAD  COMPARISON:  Previous exam(s).  ACR Breast Density Category b: There are scattered areas of fibroglandular density.  FINDINGS: There are no findings suspicious for malignancy. Images were processed with CAD.  IMPRESSION: No mammographic evidence of malignancy. A result letter of this screening mammogram will be mailed directly to the patient.  RECOMMENDATION: Screening mammogram in one year. (Code:[id number]-B-01Y)  BI-RADS CATEGORY  1: Negative.  Electronically Signed   By: Lillia Mountain M.D.   On: 02/08/2020 12:15 MM Outside Films Mammo This examination belongs to an outside facility and is stored here for  comparison purposes only.  Contact the originating outside institution for  any associated report or interpretation. MM Outside Films Mammo This examination belongs to an outside facility and is stored here for  comparison purposes only.  Contact the originating outside institution for  any associated report or interpretation. MM Outside Films Mammo This examination belongs to an outside facility and is stored here for  comparison purposes only.  Contact the originating outside institution for  any associated report or interpretation. MM Outside Films Mammo This examination belongs to an outside facility and is stored here for  comparison purposes only.  Contact the originating outside institution for  any associated report or  interpretation. MM Outside Films Mammo This examination belongs to an outside facility and is stored here for  comparison purposes only.  Contact the originating outside institution for  any associated report or interpretation. Note: Reviewed        Physical Exam  General appearance: Well nourished, well developed, and well hydrated. In no apparent acute distress Mental status: Alert, oriented x 3 (person, place, & time)  Respiratory: No evidence of acute respiratory distress Eyes: PERLA Vitals: BP (!) 142/97   Pulse 79   Temp 97.9 F (36.6 C)   Resp 18   Ht '4\' 11"'  (1.499 m)   Wt 195 lb (88.5 kg)   SpO2 100%   BMI 39.39 kg/m  BMI: Estimated body mass index is 39.39 kg/m as calculated from the following:   Height as of this encounter: '4\' 11"'  (1.499 m).   Weight as of this encounter: 195 lb (88.5 kg). Ideal: Patient must be at least 60 in tall to calculate ideal body weight    Lumbar Spine Area Exam  Skin & Axial Inspection:No masses, redness, or swelling Alignment:Symmetrical Functional CNO:BSJG restricted ROMaffecting both sides Stability:No instability detected Muscle Tone/Strength:Functionally intact. No obvious neuro-muscular anomalies detected. Sensory (Neurological):Musculoskeletal pain pattern  Gait & Posture Assessment  Ambulation:Unassisted Gait:Relatively normal for age and body habitus Posture:WNL  Lower Extremity Exam    Side:Right lower extremity  Side:Left lower extremity  Stability:No instability observed  Stability:No instability observed  Skin & Extremity Inspection:Skin color, temperature, and hair growth are WNL. No peripheral edema or cyanosis. No masses, redness, swelling, asymmetry, or associated skin lesions. No contractures.  Skin & Extremity Inspection:Skin color, temperature, and hair growth are WNL. No peripheral edema or cyanosis. No masses, redness, swelling, asymmetry, or associated skin lesions.  No contractures.  Functional GEZ:[id number] ROM   Functional PTW:SFKCLEXNTZGY ROM   Muscle Tone/Strength:Functionally intact. No obvious neuro-muscular anomalies detected.  Muscle Tone/Strength:Functionally intact. No obvious neuro-muscular anomalies detected.  Sensory (Neurological):Musculoskeletal pain pattern  Sensory (Neurological):Musculoskeletal pain pattern  DTR: Patellar:deferred today Achilles:deferred today Plantar:deferred today  DTR: Patellar:deferred today Achilles:deferred today Plantar:deferred today  Palpation:No palpable anomalies  Palpation:No palpable anomalies     Assessment   Status Diagnosis  Controlled Controlled Controlled 1. Fibromyalgia   2. Thoracic myelopathy (due to syrinx, s/p excision 12/23/2017)   3. Chronic bilateral low back pain without sciatica   4. Syrinx of spinal cord ([place])   5. History of total knee arthroplasty, left   6. Chronic pain of both hips   7. Lumbosacral spondylosis without myelopathy   8. Primary osteoarthritis of both knees   9. Chronic pain syndrome       Plan of Care  Kristin Porter has a current medication list which includes the following long-term medication(s): albuterol, amitriptyline, amlodipine, atorvastatin, fluticasone, losartan, omeprazole, paroxetine, pregabalin, and zolpidem.  Pharmacotherapy (Medications Ordered): Meds ordered this encounter  Medications  . HYDROcodone-acetaminophen (NORCO) 7.5-325 MG tablet    Sig: Take 1 tablet by mouth 2 (two) times daily as needed for severe pain. Must last 30 days.    Dispense:  60 tablet    Refill:  0    Chronic Pain. (STOP Act - Not applicable). Fill one day early if closed on scheduled refill date.  Marland Kitchen HYDROcodone-acetaminophen (NORCO) 7.5-325 MG tablet    Sig: Take 1 tablet by mouth 2 (two) times daily as needed for severe pain. Must last 30 days.    Dispense:  60 tablet    Refill:  0     Chronic Pain. (STOP Act - Not applicable). Fill one day early if closed on scheduled refill date.   Continue multimodal analgesics with amitriptyline, baclofen, Mobic as prescribed continue Lyrica 150 mg nightly.  Follow-up plan:   Return in about 2 months (around 04/26/2020) for Medication Management, in person.   Recent Visits Date Type Provider Dept  12/29/19 Office Visit Gillis Santa, MD [facility]  Showing recent visits within past 90 days and meeting all other requirements Today's Visits Date Type Provider Dept  02/18/20 Office Visit Gillis Santa, MD [facility]  Showing today's visits and meeting all other requirements Future Appointments Date Type Provider Dept  04/21/20 Appointment Gillis Santa, MD [facility]  Showing future appointments within next 90 days and meeting all other requirements  I discussed the assessment and treatment plan with the patient. The patient was provided an opportunity to ask questions and all were answered. The patient agreed with the plan and demonstrated an understanding of the instructions.  Patient advised to call back or seek an in-person evaluation if the symptoms or condition worsens.  Duration of encounter: 30 minutes.  Note by: Gillis Santa, MD Date: 02/18/2020; Time: 1:06 PM

## 2020-02-25 DIAGNOSIS — M7531 Calcific tendinitis of right shoulder: Secondary | ICD-10-CM | POA: Diagnosis not present

## 2020-02-25 DIAGNOSIS — M7542 Impingement syndrome of left shoulder: Secondary | ICD-10-CM | POA: Insufficient documentation

## 2020-02-25 DIAGNOSIS — M19011 Primary osteoarthritis, right shoulder: Secondary | ICD-10-CM | POA: Diagnosis not present

## 2020-02-25 DIAGNOSIS — M19012 Primary osteoarthritis, left shoulder: Secondary | ICD-10-CM | POA: Diagnosis not present

## 2020-03-04 DIAGNOSIS — H25013 Cortical age-related cataract, bilateral: Secondary | ICD-10-CM | POA: Diagnosis not present

## 2020-03-04 DIAGNOSIS — H43812 Vitreous degeneration, left eye: Secondary | ICD-10-CM | POA: Diagnosis not present

## 2020-03-22 ENCOUNTER — Ambulatory Visit (INDEPENDENT_AMBULATORY_CARE_PROVIDER_SITE_OTHER): Payer: Medicare Other | Admitting: Family Medicine

## 2020-03-22 ENCOUNTER — Other Ambulatory Visit: Payer: Self-pay

## 2020-03-22 ENCOUNTER — Encounter: Payer: Self-pay | Admitting: Family Medicine

## 2020-03-22 VITALS — BP 145/74 | HR 71 | Temp 97.3°F | Resp 18 | Ht 59.0 in | Wt 199.4 lb

## 2020-03-22 DIAGNOSIS — I10 Essential (primary) hypertension: Secondary | ICD-10-CM | POA: Diagnosis not present

## 2020-03-22 DIAGNOSIS — R61 Generalized hyperhidrosis: Secondary | ICD-10-CM

## 2020-03-22 DIAGNOSIS — R7303 Prediabetes: Secondary | ICD-10-CM

## 2020-03-22 DIAGNOSIS — E782 Mixed hyperlipidemia: Secondary | ICD-10-CM

## 2020-03-22 DIAGNOSIS — Z0000 Encounter for general adult medical examination without abnormal findings: Secondary | ICD-10-CM | POA: Diagnosis not present

## 2020-03-22 DIAGNOSIS — Z1159 Encounter for screening for other viral diseases: Secondary | ICD-10-CM | POA: Diagnosis not present

## 2020-03-22 MED ORDER — HYDROCHLOROTHIAZIDE 12.5 MG PO TABS: 12.5000 mg | ORAL_TABLET | Freq: Every day | ORAL | 0 refills | Status: DC

## 2020-03-22 NOTE — Assessment & Plan Note (Signed)
Pt requesting labs for hormones, has been having night sweats.  Discussed increase of paroxetine and will defer to labs at this time and re-evaluate again in 4 weeks.

## 2020-03-22 NOTE — Assessment & Plan Note (Signed)
Uncontrolled hypertension.  BP is not at goal < 130/80.  Pt is not working on lifestyle modifications.  Taking medications tolerating well without side effects.  Complications:  Prediabetes, GERD, HLD  Plan: 1. Continue taking amlodipine 10mg  daily and losartan 50mg  daily and BEGIN hydrochlorothiazide 12.5mg  daily 2. Obtain labs in 4 weeks  3. Encouraged heart healthy diet and increasing exercise to 30 minutes most days of the week, going no more than 2 days in a row without exercise. 4. Check BP 1-2 x per week at home, keep log, and bring to clinic at next appointment. 5. Follow up 4 weeks.

## 2020-03-22 NOTE — Assessment & Plan Note (Signed)
Status unknown.  Recheck labs.  Continue meds without changes today.  Refills provided. Followup after labs.  

## 2020-03-22 NOTE — Assessment & Plan Note (Signed)
Will have A1C drawn with labs for evaluation of prediabetes

## 2020-03-22 NOTE — Patient Instructions (Signed)
Have your labs drawn and we will contact you with the results.  Continue all of your medications as directed  I have sent in a prescription for hydrochlorothiazide 12.5mg  to take 1 tablet daily in the morning.  This medication is in addition to your losartan and amlodipine for your blood pressure control.  We will plan to see you back in 4 weeks for hypertension follow up and lab work  You will receive a survey after today's visit either digitally by e-mail or paper by [employer]. Your experiences and feedback matter to [place].  Please respond so we know how we are doing as we provide care for you.  Call us with any questions/concerns/needs.  It is my goal to be available to you for your health concerns.  Thanks for choosing me to be a partner in your healthcare needs!  Harlin Rain, FNP-C Family Nurse Practitioner [facility] Phone: [phone]

## 2020-03-22 NOTE — Progress Notes (Signed)
Subjective:    Patient ID: Kristin Porter, female    DOB: August 25, 1960, 59 y.o.   MRN: [id number]  Kristin Porter is a 59 y.o. female presenting on 03/22/2020 for Hypertension and Prediabetes   HPI  Hypertension - She is not checking BP at home or outside of clinic.    - Current medications: amlodipine 10mg  daily, losartan 50mg  daily, tolerating well without side effects - She is not currently symptomatic. - Pt denies headache, lightheadedness, dizziness, changes in vision, chest tightness/pressure, palpitations, leg swelling, sudden loss of speech or loss of consciousness. - She  reports no regular exercise routine. - Her diet is high in salt, high in fat, and high in carbohydrates.   Depression screen [facility] 2/9 03/22/2020 12/21/2019 11/05/2019  Decreased Interest 1 0 0  Down, Depressed, Hopeless 0 0 0  PHQ - 2 Score 1 0 0  Altered sleeping 3 3 -  Tired, decreased energy 1 1 -  Change in appetite 2 2 -  Feeling bad or failure about yourself  0 0 -  Trouble concentrating 1 0 -  Moving slowly or fidgety/restless 0 0 -  Suicidal thoughts 0 0 -  PHQ-9 Score 8 6 -  Difficult doing work/chores Not difficult at all Very difficult -    Social History   Tobacco Use  . Smoking status: Former Smoker    Quit date: 1970    Years since quitting: 51.8  . Smokeless tobacco: Never Used  Vaping Use  . Vaping Use: Never used  Substance Use Topics  . Alcohol use: Not Currently  . Drug use: Never    Review of Systems  Constitutional: Negative.        Night sweats  HENT: Negative.   Eyes: Negative.   Respiratory: Negative.   Cardiovascular: Negative.   Gastrointestinal: Negative.   Endocrine: Negative.   Genitourinary: Negative.   Musculoskeletal: Negative.   Skin: Negative.   Allergic/Immunologic: Negative.   Neurological: Negative.   Hematological: Negative.   Psychiatric/Behavioral: Negative.    Per HPI unless specifically indicated above     Objective:    BP (!)  145/74 (BP Location: Right Arm, Patient Position: Sitting, Cuff Size: Large)   Pulse 71   Temp (!) 97.3 F (36.3 C)   Resp 18   Ht 4\' 11"  (1.499 m)   Wt 199 lb 6.4 oz (90.4 kg)   SpO2 100%   BMI 40.27 kg/m   Wt Readings from Last 3 Encounters:  03/22/20 199 lb 6.4 oz (90.4 kg)  02/18/20 195 lb (88.5 kg)  12/29/19 199 lb (90.3 kg)    Physical Exam Vitals and nursing note reviewed.  Constitutional:      General: She is not in acute distress.    Appearance: Normal appearance. She is well-developed and well-groomed. She is morbidly obese. She is not ill-appearing or toxic-appearing.  HENT:     Head: Normocephalic and atraumatic.     Nose:     Comments: Lizbeth Bark is in place, covering mouth and nose. Eyes:     General: Lids are normal. Vision grossly intact.        Right eye: No discharge.        Left eye: No discharge.     Extraocular Movements: Extraocular movements intact.     Conjunctiva/sclera: Conjunctivae normal.     Pupils: Pupils are equal, round, and reactive to light.  Cardiovascular:     Rate and Rhythm: Normal rate and regular rhythm.  Pulses: Normal pulses.          Dorsalis pedis pulses are 2+ on the right side and 2+ on the left side.     Heart sounds: Normal heart sounds. No murmur heard.  No friction rub. No gallop.   Pulmonary:     Effort: Pulmonary effort is normal. No respiratory distress.     Breath sounds: Normal breath sounds.  Musculoskeletal:     Right lower leg: No edema.     Left lower leg: No edema.  Skin:    General: Skin is warm and dry.     Capillary Refill: Capillary refill takes less than 2 seconds.  Neurological:     General: No focal deficit present.     Mental Status: She is alert and oriented to person, place, and time.  Psychiatric:        Attention and Perception: Attention and perception normal.        Mood and Affect: Mood and affect normal.        Speech: Speech normal.        Behavior: Behavior normal. Behavior is  cooperative.        Thought Content: Thought content normal.        Cognition and Memory: Cognition and memory normal.        Judgment: Judgment normal.    Results for orders placed or performed in visit on 09/17/19  HM MAMMOGRAPHY  Result Value Ref Range   HM Mammogram 0-4 Bi-Rad 0-4 Bi-Rad, Self Reported Normal  CBC with Differential  Result Value Ref Range   WBC 6.2 3.8 - 10.8 Thousand/uL   RBC 4.92 3.80 - 5.10 Million/uL   Hemoglobin 14.2 11.7 - 15.5 g/dL   HCT 44.0 35 - 45 %   MCV 89.4 80.0 - 100.0 fL   MCH 28.9 27.0 - 33.0 pg   MCHC 32.3 32.0 - 36.0 g/dL   RDW 13.5 11.0 - 15.0 %   Platelets 296 140 - 400 Thousand/uL   MPV 10.2 7.5 - 12.5 fL   Neutro Abs 3,714 1,500 - 7,800 cells/uL   Lymphs Abs 1,761 850 - 3,900 cells/uL   Absolute Monocytes 546 200 - 950 cells/uL   Eosinophils Absolute 149 15.0 - 500.0 cells/uL   Basophils Absolute 31 0.0 - 200.0 cells/uL   Neutrophils Relative % 59.9 %   Total Lymphocyte 28.4 %   Monocytes Relative 8.8 %   Eosinophils Relative 2.4 %   Basophils Relative 0.5 %  COMPLETE METABOLIC PANEL WITH GFR  Result Value Ref Range   Glucose, Bld 101 (H) 65 - 99 mg/dL   BUN 15 7 - 25 mg/dL   Creat 0.69 0.50 - 0.99 mg/dL   GFR, Est Non African American 94 > OR = 60 mL/min/1.76m2   GFR, Est African American 109 > OR = 60 mL/min/1.31m2   BUN/Creatinine Ratio NOT APPLICABLE 6 - 22 (calc)   Sodium 140 135 - 146 mmol/L   Potassium 4.7 3.5 - 5.3 mmol/L   Chloride 104 98 - 110 mmol/L   CO2 26 20 - 32 mmol/L   Calcium 9.6 8.6 - 10.4 mg/dL   Total Protein 7.1 6.1 - 8.1 g/dL   Albumin 4.2 3.6 - 5.1 g/dL   Globulin 2.9 1.9 - 3.7 g/dL (calc)   AG Ratio 1.4 1.0 - 2.5 (calc)   Total Bilirubin 0.4 0.2 - 1.2 mg/dL   Alkaline phosphatase (APISO) 60 37 - 153 U/L   AST 19 10 - 35  U/L   ALT 19 6 - 29 U/L  Lipid Profile  Result Value Ref Range   Cholesterol 233 (H) <200 mg/dL   HDL 54 > OR = 50 mg/dL   Triglycerides 111 <150 mg/dL   LDL Cholesterol  (Calc) 156 (H) mg/dL (calc)   Total CHOL/HDL Ratio 4.3 <5.0 (calc)   Non-HDL Cholesterol (Calc) 179 (H) <130 mg/dL (calc)  Thyroid Panel With TSH  Result Value Ref Range   T3 Uptake 27 22 - 35 %   T4, Total 7.4 5.1 - 11.9 mcg/dL   Free Thyroxine Index 2.0 1.4 - 3.8   TSH 1.11 0.40 - 4.50 mIU/L  HIV antibody (with reflex)  Result Value Ref Range   HIV 1&2 Ab, 4th Generation NON-REACTIVE NON-REACTI  Hepatitis C Antibody  Result Value Ref Range   Hepatitis C Ab NON-REACTIVE NON-REACTI   SIGNAL TO CUT-OFF 0.04 <1.00  Urinalysis, microscopic only  Result Value Ref Range   WBC, UA NONE SEEN 0 - 5 /HPF   RBC / HPF NONE SEEN 0 - 2 /HPF   Squamous Epithelial / LPF NONE SEEN < OR = 5 /HPF   Bacteria, UA NONE SEEN NONE SEEN /HPF   Hyaline Cast NONE SEEN NONE SEEN /LPF  POCT Urinalysis Dipstick  Result Value Ref Range   Color, UA Yellow    Clarity, UA clear    Glucose, UA Negative Negative   Bilirubin, UA negative    Ketones, UA negative    Spec Grav, UA 1.010 1.010 - 1.025   Blood, UA trace    pH, UA 5.0 5.0 - 8.0   Protein, UA Negative Negative   Urobilinogen, UA 0.2 0.2 or 1.0 E.U./dL   Nitrite, UA negative    Leukocytes, UA Trace (A) Negative   Appearance     Odor    POCT glycosylated hemoglobin (Hb A1C)  Result Value Ref Range   Hemoglobin A1C 5.7 (A) 4.0 - 5.6 %   HbA1c POC (<> result, manual entry)     HbA1c, POC (prediabetic range)     HbA1c, POC (controlled diabetic range)    HM COLONOSCOPY  Result Value Ref Range   HM Colonoscopy See Report (in chart) See Report (in chart), Patient Reported  Cytology - PAP  Result Value Ref Range   Amendment      AMENDMENT NOTE: DIAGNOSTIC INFORMATION HAS NOT BEEN CHANGED. Data was   Amendment      incorrectly entered or omitted which prevented electronic transmittal of   Amendment      results into the EMR. This has been corrected. (initials:   Amendment t.coleman/date: 10/13/2019)    Adequacy      Satisfactory for evaluation;  transformation zone component ABSENT.   Diagnosis      - Negative for intraepithelial lesion or malignancy (NILM)      Assessment & Plan:   Problem List Items Addressed This Visit      Cardiovascular and Mediastinum   Hypertension - Primary    Uncontrolled hypertension.  BP is not at goal < 130/80.  Pt is not working on lifestyle modifications.  Taking medications tolerating well without side effects.  Complications:  Prediabetes, GERD, HLD  Plan: 1. Continue taking amlodipine 10mg  daily and losartan 50mg  daily and BEGIN hydrochlorothiazide 12.5mg  daily 2. Obtain labs in 4 weeks  3. Encouraged heart healthy diet and increasing exercise to 30 minutes most days of the week, going no more than 2 days in a row without exercise. 4.  Check BP 1-2 x per week at home, keep log, and bring to clinic at next appointment. 5. Follow up 4 weeks.       Relevant Medications   hydrochlorothiazide (HYDRODIURIL) 12.5 MG tablet   Other Relevant Orders   CBC with Differential   COMPLETE METABOLIC PANEL WITH GFR     Other   Hyperlipidemia    Status unknown.  Recheck labs.  Continue meds without changes today.  Refills provided. Followup after labs.       Relevant Medications   hydrochlorothiazide (HYDRODIURIL) 12.5 MG tablet   Other Relevant Orders   Lipid Profile   Prediabetes    Will have A1C drawn with labs for evaluation of prediabetes      Relevant Orders   HgB A1c   Night sweats    Pt requesting labs for hormones, has been having night sweats.  Discussed increase of paroxetine and will defer to labs at this time and re-evaluate again in 4 weeks.      Relevant Orders   FSH/LH      Meds ordered this encounter  Medications  . hydrochlorothiazide (HYDRODIURIL) 12.5 MG tablet    Sig: Take 1 tablet (12.5 mg total) by mouth daily.    Dispense:  90 tablet    Refill:  0    Follow up plan: Return in about 4 weeks (around 04/19/2020) for HTN F/U and labs.   Harlin Rain,  [place] [facility] 03/22/2020, 1:17 PM

## 2020-03-23 LAB — COMPLETE METABOLIC PANEL WITH GFR
AG Ratio: 1.3 (calc) (ref 1.0–2.5)
ALT: 18 U/L (ref 6–29)
AST: 17 U/L (ref 10–35)
Albumin: 4.3 g/dL (ref 3.6–5.1)
Alkaline phosphatase (APISO): 68 U/L (ref 37–153)
BUN: 15 mg/dL (ref 7–25)
CO2: 28 mmol/L (ref 20–32)
Calcium: 9.7 mg/dL (ref 8.6–10.4)
Chloride: 103 mmol/L (ref 98–110)
Creat: 0.71 mg/dL (ref 0.50–0.99)
GFR, Est African American: 107 mL/min/{1.73_m2} (ref >=60)
GFR, Est Non African American: 92 mL/min/{1.73_m2} (ref >=60)
Globulin: 3.3 g/dL (calc) (ref 1.9–3.7)
Glucose, Bld: 103 mg/dL — ABNORMAL HIGH (ref 65–99)
Potassium: 4.4 mmol/L (ref 3.5–5.3)
Sodium: 140 mmol/L (ref 135–146)
Total Bilirubin: 0.3 mg/dL (ref 0.2–1.2)
Total Protein: 7.6 g/dL (ref 6.1–8.1)

## 2020-03-23 LAB — CBC WITH DIFFERENTIAL/PLATELET
Absolute Monocytes: 599 cells/uL (ref 200–950)
Basophils Absolute: 37 cells/uL (ref 0–200)
Basophils Relative: 0.5 %
Eosinophils Absolute: 73 cells/uL (ref 15–500)
Eosinophils Relative: 1 %
HCT: 42.4 % (ref 35.0–45.0)
Hemoglobin: 14.1 g/dL (ref 11.7–15.5)
Lymphs Abs: 2051 cells/uL (ref 850–3900)
MCH: 29.1 pg (ref 27.0–33.0)
MCHC: 33.3 g/dL (ref 32.0–36.0)
MCV: 87.6 fL (ref 80.0–100.0)
MPV: 10.4 fL (ref 7.5–12.5)
Monocytes Relative: 8.2 %
Neutro Abs: 4541 cells/uL (ref 1500–7800)
Neutrophils Relative %: 62.2 %
Platelets: 319 10*3/uL (ref 140–400)
RBC: 4.84 10*6/uL (ref 3.80–5.10)
RDW: 14.3 % (ref 11.0–15.0)
Total Lymphocyte: 28.1 %
WBC: 7.3 10*3/uL (ref 3.8–10.8)

## 2020-03-23 LAB — HEMOGLOBIN A1C
Hgb A1c MFr Bld: 5.9 % of total Hgb — ABNORMAL HIGH (ref <5.7)
Mean Plasma Glucose: 123 (calc)
eAG (mmol/L): 6.8 (calc)

## 2020-03-23 LAB — FSH/LH
FSH: 58.9 m[IU]/mL
LH: 26.8 m[IU]/mL

## 2020-03-23 LAB — LIPID PANEL
Cholesterol: 258 mg/dL — ABNORMAL HIGH (ref <200)
HDL: 60 mg/dL (ref >=50)
LDL Cholesterol (Calc): 167 mg/dL (calc) — ABNORMAL HIGH
Non-HDL Cholesterol (Calc): 198 mg/dL (calc) — ABNORMAL HIGH (ref <130)
Total CHOL/HDL Ratio: 4.3 (calc) (ref <5.0)
Triglycerides: 160 mg/dL — ABNORMAL HIGH (ref <150)

## 2020-03-28 ENCOUNTER — Telehealth: Payer: Self-pay

## 2020-03-28 ENCOUNTER — Other Ambulatory Visit: Payer: Self-pay | Admitting: Family Medicine

## 2020-03-28 DIAGNOSIS — I10 Essential (primary) hypertension: Secondary | ICD-10-CM

## 2020-03-28 NOTE — Telephone Encounter (Signed)
I don't see in our last visit that we discussed going to cardiology.  I put in a referral per patient request.  TY

## 2020-03-28 NOTE — Telephone Encounter (Signed)
Called pt left VM with referral to cardiology.  KP

## 2020-03-28 NOTE — Telephone Encounter (Signed)
Copied from [place] [id number]. Topic: Referral - Status >> Mar 28, 2020  1:09 PM Alanda Slim E wrote: Reason for CRM: Pt called about her referral to a cardiologist/ Pt stated she was told she would have a referral placed after getting her blood work results Derrek Monaco call pt  today and advise asap

## 2020-03-31 ENCOUNTER — Telehealth: Payer: Self-pay

## 2020-03-31 NOTE — Telephone Encounter (Signed)
She called and left a vm saying she needed a script for her narcotics. I called her back and left a vm letting her know she has a med refill app on 12/2 and if she had any other questions to call us back.

## 2020-04-01 ENCOUNTER — Telehealth: Payer: Self-pay | Admitting: Student in an Organized Health Care Education/Training Program

## 2020-04-01 NOTE — Telephone Encounter (Signed)
Spoke with patient.  She states she had called the pharmacy over a week ago and they told her they did not have it.  Our records indicate that she could have fille dit yesterday.  Informed patient that it sometimes does not appear on their record until close to time to fill.  She will call the pharmacy again to have them fill script./

## 2020-04-01 NOTE — Telephone Encounter (Signed)
Patient states she has not been able to pick up her script for pain meds from October, the pharmacy is telling her she needs to bring a paper script for this medication. Please call her pharmacy and find out what needs to be done so she can get her medications.

## 2020-04-01 NOTE — Telephone Encounter (Signed)
Attempted to call patient for clarification.  LM

## 2020-04-04 ENCOUNTER — Other Ambulatory Visit: Payer: Self-pay

## 2020-04-04 ENCOUNTER — Encounter: Payer: Self-pay | Admitting: Cardiology

## 2020-04-04 ENCOUNTER — Ambulatory Visit (INDEPENDENT_AMBULATORY_CARE_PROVIDER_SITE_OTHER): Payer: Medicare Other | Admitting: Cardiology

## 2020-04-04 VITALS — BP 128/78 | HR 77 | Ht 59.0 in | Wt 204.2 lb

## 2020-04-04 DIAGNOSIS — E7800 Pure hypercholesterolemia, unspecified: Secondary | ICD-10-CM | POA: Diagnosis not present

## 2020-04-04 DIAGNOSIS — I10 Essential (primary) hypertension: Secondary | ICD-10-CM | POA: Diagnosis not present

## 2020-04-04 NOTE — Patient Instructions (Signed)
Medication Instructions:   Please call your Insurance and ask if they will cover [place] or [place]?   *If you need a refill on your cardiac medications before your next appointment, please call your pharmacy*   Lab Work:  Your physician recommends that you return for a FASTING lipid profile:  The week prior to your 2 month follow up appointment.  - You will need to be fasting. Please do not have anything to eat or drink after midnight the morning you have the lab work. You may only have water or black coffee with no cream or sugar. - Please go to the [facility]. You will check in at the front desk to the right as you walk into the atrium. Valet Parking is offered if needed. - No appointment needed. You may go any day between 7 am and 6 pm.  Testing/Procedures: None Ordered   Follow-Up: At [facility], you and your health needs are our priority.  As part of our continuing mission to provide you with exceptional heart care, we have created designated Provider Care Teams.  These Care Teams include your primary Cardiologist (physician) and Advanced Practice Providers (APPs -  Physician Assistants and Nurse Practitioners) who all work together to provide you with the care you need, when you need it.  We recommend signing up for the patient portal called "MyChart".  Sign up information is provided on this After Visit Summary.  MyChart is used to connect with patients for Virtual Visits (Telemedicine).  Patients are able to view lab/test results, encounter notes, upcoming appointments, etc.  Non-urgent messages can be sent to your provider as well.   To learn more about what you can do with MyChart, go to [email or web address].    Your next appointment:   2 month(s)  The format for your next appointment:   In Person  Provider:   Kate Sable, MD   Other Instructions

## 2020-04-04 NOTE — Progress Notes (Signed)
Cardiology Office Note:    Date:  04/04/2020   ID:  Kristin Porter, DOB January 30, 1961, MRN [id number]  PCP:  Verl Bangs, FNP  [facility] Cardiologist:  Kate Sable, MD  [facility] Electrophysiologist:  None   Referring MD: Verl Bangs, FNP   Chief Complaint  Patient presents with   OTHER    HTN and hyperlipidemia. Meds reviewed verbally with pt.    History of Present Illness:    Kristin Porter is a 59 y.o. female with a hx of hypertension, hyperlipidemia who presents due to elevated lateral levels.  Patient states having hyperlipidemia for over a year now.  She has been placed on high intensity statin, Lipitor 80 mg for which she has taken for at least 1 year.  She had a recent repeat fasting lipid profile 2 weeks ago showing elevated LDL, total, triglyceride levels.  She denies chest pain or shortness of breath at rest or with exertion.  Her blood pressures were previously elevated, blood pressure medications recently titrated by primary care provider.  Past Medical History:  Diagnosis Date   Arthritis    Bilateral hips   Fibromyalgia    Hypertension    Neuropathy     Past Surgical History:  Procedure Laterality Date   BREAST BIOPSY Right 2018   benign, clip was placed    Current Medications: Current Meds  Medication Sig   albuterol (PROVENTIL) (2.5 MG/3ML) 0.083% nebulizer solution Take 2.5 mg by nebulization every 6 (six) hours as needed for wheezing or shortness of breath.   amitriptyline (ELAVIL) 25 MG tablet Take 1 tablet (25 mg total) by mouth at bedtime.   amLODipine (NORVASC) 10 MG tablet Take 1 tablet (10 mg total) by mouth daily.   atorvastatin (LIPITOR) 80 MG tablet Take 1 tablet (80 mg total) by mouth daily.   baclofen (LIORESAL) 10 MG tablet Take 1 tablet (10 mg total) by mouth 4 (four) times daily as needed for muscle spasms.   Blood Pressure Monitoring (BLOOD PRESSURE KIT) DEVI 1 Units by Does not apply route in  the morning and at bedtime.   fluticasone (FLONASE) 50 MCG/ACT nasal spray Place 1 spray into both nostrils 2 (two) times daily.   hydrochlorothiazide (HYDRODIURIL) 12.5 MG tablet Take 1 tablet (12.5 mg total) by mouth daily.   HYDROcodone-acetaminophen (NORCO) 7.5-325 MG tablet Take 1 tablet by mouth 2 (two) times daily as needed for severe pain. Must last 30 days.   losartan (COZAAR) 50 MG tablet Take 1 tablet (50 mg total) by mouth daily.   meloxicam (MOBIC) 15 MG tablet Take 1 tablet (15 mg total) by mouth daily as needed for pain.   NARCAN 4 MG/0.1ML LIQD nasal spray kit CALL 911. ADMINISTER A SINGLE SPRAY OF NARCAN IN ONE NOSTRIL. REPEAT EVERY 3 MINUTES AS NEEDED IF NO OR MINIMAL RESPONSE.   omeprazole (PRILOSEC) 40 MG capsule Take 1 capsule (40 mg total) by mouth daily as needed.   PARoxetine (PAXIL) 10 MG tablet Take 1 tablet (10 mg total) by mouth daily.   pregabalin (LYRICA) 75 MG capsule Take 2 capsules (150 mg total) by mouth at bedtime.   zolpidem (AMBIEN) 10 MG tablet Take 1 tablet (10 mg total) by mouth at bedtime as needed for sleep.     Allergies:   Tizanidine, Lisinopril, and Tramadol   Social History   Socioeconomic History   Marital status: Divorced    Spouse name: Not on file   Number of children: Not on  file   Years of education: Not on file   Highest education level: Not on file  Occupational History   Occupation: disability   Tobacco Use   Smoking status: Former Smoker    Quit date: 1970    Years since quitting: 51.9   Smokeless tobacco: Never Used  Scientific laboratory technician Use: Never used  Substance and Sexual Activity   Alcohol use: Not Currently   Drug use: Never   Sexual activity: Not on file  Other Topics Concern   Not on file  Social History Narrative   Not on file   Social Determinants of Health   Financial Resource Strain: Low Risk    Difficulty of Paying Living Expenses: Not hard at all  Food Insecurity: No Food  Insecurity   Worried About Charity fundraiser in the Last Year: Never true   Arboriculturist in the Last Year: Never true  Transportation Needs: No Transportation Needs   Lack of Transportation (Medical): No   Lack of Transportation (Non-Medical): No  Physical Activity: Inactive   Days of Exercise per Week: 0 days   Minutes of Exercise per Session: 0 min  Stress:    Feeling of Stress : Not on file  Social Connections: Socially Isolated   Frequency of Communication with Friends and Family: More than three times a week   Frequency of Social Gatherings with Friends and Family: More than three times a week   Attends Religious Services: Never   Marine scientist or Organizations: No   Attends Archivist Meetings: Never   Marital Status: Divorced     Family History: The patient's Family history is unknown by patient.  ROS:   Please see the history of present illness.     All other systems reviewed and are negative.  EKGs/Labs/Other Studies Reviewed:    The following studies were reviewed today:   EKG:  EKG is  ordered today.  The ekg ordered today demonstrates sinus rhythm, normal ECG.  Recent Labs: 09/17/2019: TSH 1.11 03/22/2020: ALT 18; BUN 15; Creat 0.71; Hemoglobin 14.1; Platelets 319; Potassium 4.4; Sodium 140  Recent Lipid Panel    Component Value Date/Time   CHOL 258 (H) 03/22/2020 0911   TRIG 160 (H) 03/22/2020 0911   HDL 60 03/22/2020 0911   CHOLHDL 4.3 03/22/2020 0911   LDLCALC 167 (H) 03/22/2020 0911     Risk Assessment/Calculations:      Physical Exam:    VS:  BP 128/78 (BP Location: Right Arm, Patient Position: Sitting, Cuff Size: Large)    Pulse 77    Ht '4\' 11"'  (1.499 m)    Wt 204 lb 4 oz (92.6 kg)    SpO2 98%    BMI 41.25 kg/m     Wt Readings from Last 3 Encounters:  04/04/20 204 lb 4 oz (92.6 kg)  03/22/20 199 lb 6.4 oz (90.4 kg)  02/18/20 195 lb (88.5 kg)     GEN:  Well nourished, well developed in no acute  distress HEENT: Normal NECK: No JVD; No carotid bruits LYMPHATICS: No lymphadenopathy CARDIAC: RRR, no murmurs, rubs, gallops RESPIRATORY:  Clear to auscultation without rales, wheezing or rhonchi  ABDOMEN: Soft, non-tender, distended MUSCULOSKELETAL:  No edema; No deformity  SKIN: Warm and dry NEUROLOGIC:  Alert and oriented x 3 PSYCHIATRIC:  Normal affect   ASSESSMENT:    1. Pure hypercholesterolemia   2. Primary hypertension    PLAN:    In order of  problems listed above:  1. Patient with hyperlipidemia, on high intensity statin.  LDL not at goal, actually got worse, total cholesterol elevated.  Continue Lipitor 80 mg daily.  Start PCSK9.  Plan for repeat fasting lipid profile in about 2 months prior to follow-up visit. 2. History of hypertension, BP controlled.  Continue current BP meds. 3. Patient is morbidly obese, low-calorie diet, weight loss recommended.  Follow-up in 2 months    Shared Decision Making/Informed Consent       Medication Adjustments/Labs and Tests Ordered: Current medicines are reviewed at length with the patient today.  Concerns regarding medicines are outlined above.  Orders Placed This Encounter  Procedures   Lipid panel   EKG 12-Lead   No orders of the defined types were placed in this encounter.   Patient Instructions  Medication Instructions:   Please call your Insurance and ask if they will cover [place] or [place]?   *If you need a refill on your cardiac medications before your next appointment, please call your pharmacy*   Lab Work:  Your physician recommends that you return for a FASTING lipid profile:  The week prior to your 2 month follow up appointment.  - You will need to be fasting. Please do not have anything to eat or drink after midnight the morning you have the lab work. You may only have water or black coffee with no cream or sugar. - Please go to the [facility]. You will check in at the front desk to the  right as you walk into the atrium. Valet Parking is offered if needed. - No appointment needed. You may go any day between 7 am and 6 pm.  Testing/Procedures: None Ordered   Follow-Up: At [facility], you and your health needs are our priority.  As part of our continuing mission to provide you with exceptional heart care, we have created designated Provider Care Teams.  These Care Teams include your primary Cardiologist (physician) and Advanced Practice Providers (APPs -  Physician Assistants and Nurse Practitioners) who all work together to provide you with the care you need, when you need it.  We recommend signing up for the patient portal called "MyChart".  Sign up information is provided on this After Visit Summary.  MyChart is used to connect with patients for Virtual Visits (Telemedicine).  Patients are able to view lab/test results, encounter notes, upcoming appointments, etc.  Non-urgent messages can be sent to your provider as well.   To learn more about what you can do with MyChart, go to [email or web address].    Your next appointment:   2 month(s)  The format for your next appointment:   In Person  Provider:   Kate Sable, MD   Other Instructions      Signed, Kate Sable, MD  04/04/2020 5:15 PM    [place]

## 2020-04-05 ENCOUNTER — Telehealth: Payer: Self-pay | Admitting: Cardiology

## 2020-04-05 NOTE — Telephone Encounter (Signed)
Patient calling  States that she spoke with insurance and they confirmed both prescriptions Please call with any additional questions

## 2020-04-06 ENCOUNTER — Telehealth: Payer: Self-pay

## 2020-04-06 MED ORDER — REPATHA SURECLICK 140 MG/ML ~~LOC~~ SOAJ: 1.0000 "pen " | SUBCUTANEOUS | 11 refills | Status: DC

## 2020-04-06 NOTE — Telephone Encounter (Signed)
Prior Authorization sent for:  [facility] (Key: [id number]) Rx #: [id number] Repatha SureClick 140MG /ML auto-injectors   Form [employer] Medicare Part D Electronic Prior Authorization Form [phone] NCPDP)

## 2020-04-06 NOTE — Telephone Encounter (Signed)
Called patient and informed her that I sent a prescription in for Repatha to [facility] on [address].  Patient was grateful for the call back and confirmed she would pick up the prescription and start taking the injection every 14 days.

## 2020-04-07 NOTE — Telephone Encounter (Signed)
Patient notified of approval for: [facility] (Key: [id number]) - [id number] [place] 140MG /ML auto-injectors     Status: PA Response - Approved  Created: November 17th, 2021 [id number]  Sent: November 17th, 2021

## 2020-04-18 DIAGNOSIS — Z20822 Contact with and (suspected) exposure to covid-19: Secondary | ICD-10-CM | POA: Diagnosis not present

## 2020-04-21 ENCOUNTER — Ambulatory Visit
Payer: Medicare Other | Attending: Student in an Organized Health Care Education/Training Program | Admitting: Student in an Organized Health Care Education/Training Program

## 2020-04-21 ENCOUNTER — Other Ambulatory Visit: Payer: Self-pay

## 2020-04-21 ENCOUNTER — Encounter: Payer: Self-pay | Admitting: Student in an Organized Health Care Education/Training Program

## 2020-04-21 ENCOUNTER — Ambulatory Visit: Payer: Medicare Other | Admitting: Family Medicine

## 2020-04-21 VITALS — BP 152/77 | HR 68 | Temp 97.5°F | Resp 18 | Ht 59.0 in | Wt 194.0 lb

## 2020-04-21 DIAGNOSIS — G8929 Other chronic pain: Secondary | ICD-10-CM | POA: Diagnosis not present

## 2020-04-21 DIAGNOSIS — M25551 Pain in right hip: Secondary | ICD-10-CM

## 2020-04-21 DIAGNOSIS — M797 Fibromyalgia: Secondary | ICD-10-CM

## 2020-04-21 DIAGNOSIS — M170 Bilateral primary osteoarthritis of knee: Secondary | ICD-10-CM

## 2020-04-21 DIAGNOSIS — M4714 Other spondylosis with myelopathy, thoracic region: Secondary | ICD-10-CM | POA: Diagnosis not present

## 2020-04-21 DIAGNOSIS — Z96652 Presence of left artificial knee joint: Secondary | ICD-10-CM

## 2020-04-21 DIAGNOSIS — M25552 Pain in left hip: Secondary | ICD-10-CM | POA: Diagnosis not present

## 2020-04-21 DIAGNOSIS — M47817 Spondylosis without myelopathy or radiculopathy, lumbosacral region: Secondary | ICD-10-CM | POA: Diagnosis not present

## 2020-04-21 DIAGNOSIS — M545 Low back pain, unspecified: Secondary | ICD-10-CM | POA: Diagnosis not present

## 2020-04-21 DIAGNOSIS — G894 Chronic pain syndrome: Secondary | ICD-10-CM | POA: Diagnosis not present

## 2020-04-21 DIAGNOSIS — G950 Syringomyelia and syringobulbia: Secondary | ICD-10-CM

## 2020-04-21 MED ORDER — BACLOFEN 10 MG PO TABS: 10.0000 mg | ORAL_TABLET | Freq: Four times a day (QID) | ORAL | 5 refills | Status: DC | PRN

## 2020-04-21 MED ORDER — PREGABALIN 75 MG PO CAPS: 150.0000 mg | ORAL_CAPSULE | Freq: Every day | ORAL | 5 refills | Status: DC

## 2020-04-21 NOTE — Progress Notes (Signed)
Nursing Pain Medication Assessment:  Safety precautions to be maintained throughout the outpatient stay will include: orient to surroundings, keep bed in low position, maintain call bell within reach at all times, provide assistance with transfer out of bed and ambulation.  Medication Inspection Compliance: Pill count conducted under aseptic conditions, in front of the patient. Neither the pills nor the bottle was removed from the patient's sight at any time. Once count was completed pills were immediately returned to the patient in their original bottle.  Medication: Hydrocodone/APAP Pill/Patch Count: 48 of 60 pills remain Pill/Patch Appearance: Markings consistent with prescribed medication Bottle Appearance: Standard pharmacy container. Clearly labeled. Filled Date: 03/22/2020 Last Medication intake:  Kristin Porter

## 2020-04-21 NOTE — Progress Notes (Signed)
PROVIDER NOTE: Information contained herein reflects review and annotations entered in association with encounter. Interpretation of such information and data should be left to medically-trained personnel. Information provided to patient can be located elsewhere in the medical record under "Patient Instructions". Document created using STT-dictation technology, any transcriptional errors that may result from process are unintentional.    Patient: Kristin Porter  Service Category: E/M  Provider: Gillis Santa, MD  DOB: 1960/07/03  DOS: 04/21/2020  Specialty: Interventional Pain Management  MRN: [id number]  Setting: Ambulatory outpatient  PCP: Verl Bangs, FNP  Type: Established Patient    Referring Provider: Verl Bangs, FNP  Location: Office  Delivery: Face-to-face     HPI  [name], a 59 y.o. year old female, is here today because of her Fibromyalgia [M79.7]. [name] primary complain today is Back Pain (lower) Last encounter: My last encounter with her was on 04/01/2020. Pertinent problems: [name] has Chronic pain of both hips; Depression; Fibromyalgia; History of total knee arthroplasty, left; Lumbosacral spondylosis without myelopathy; Multiple joint pain; OA (osteoarthritis) of knee; and Chronic pain syndrome on their pertinent problem list. Pain Assessment: Severity of Chronic pain is reported as a 6 /10. Location: Back Lower/both hips. Onset: More than a month ago. Quality: Throbbing, Tingling, Numbness, Stabbing. Timing: Constant. Modifying factor(s): sitting. Vitals:  height is _0  (1.499 m) and weight is 194 lb (88 kg). Her temporal temperature is 97.5 F (36.4 C) (abnormal). Her blood pressure is 152/77 (abnormal) and her pulse is 68. Her respiration is 18 and oxygen saturation is 99%.   Reason for encounter: medication management.   No change in medical history since last visit.  Patient's pain is at baseline.  Patient continues multimodal pain  regimen as prescribed.  States that it provides pain relief and improvement in functional status.  ROS  Constitutional: Denies any fever or chills Gastrointestinal: No reported hemesis, hematochezia, vomiting, or acute GI distress Musculoskeletal: Denies any acute onset joint swelling, redness, loss of ROM, or weakness Neurological: No reported episodes of acute onset apraxia, aphasia, dysarthria, agnosia, amnesia, paralysis, loss of coordination, or loss of consciousness  Medication Review  Blood Pressure Kit, Evolocumab, HYDROcodone-acetaminophen, PARoxetine, albuterol, amLODipine, baclofen, fluticasone, hydrochlorothiazide, meloxicam, naloxone, omeprazole, pregabalin, and zolpidem  History Review  Allergy: [name] is allergic to tizanidine, lisinopril, and tramadol. Drug: [name]  reports no history of drug use. Alcohol:  reports previous alcohol use. Tobacco:  reports that she quit smoking about 51 years ago. She has never used smokeless tobacco. Social: Ms. Swalley  reports that she quit smoking about 51 years ago. She has never used smokeless tobacco. She reports previous alcohol use. She reports that she does not use drugs. Medical:  has a past medical history of Arthritis, Fibromyalgia, Hypertension, and Neuropathy. Surgical: [name]  has a past surgical history that includes Breast biopsy (Right, 2018). Family: Family history is unknown by patient.  Laboratory Chemistry Profile   Renal Lab Results  Component Value Date   BUN 15 03/22/2020   CREATININE 0.71 54/98/2641   BCR NOT APPLICABLE 58/30/9407   GFRAA 107 03/22/2020   GFRNONAA 92 03/22/2020     Hepatic Lab Results  Component Value Date   AST 17 03/22/2020   ALT 18 03/22/2020     Electrolytes Lab Results  Component Value Date   NA 140 03/22/2020   K 4.4 03/22/2020   CL 103 03/22/2020   CALCIUM 9.7 03/22/2020     Bone No  results found for: VD25OH, [id number], [id number], ZO1096EA5, 25OHVITD1,  25OHVITD2, 25OHVITD3, TESTOFREE, TESTOSTERONE   Inflammation (CRP: Acute Phase) (ESR: Chronic Phase) No results found for: CRP, ESRSEDRATE, LATICACIDVEN     Note: Above Lab results reviewed.  Recent Imaging Review  MM 3D SCREEN BREAST BILATERAL CLINICAL DATA:  Screening.  EXAM: DIGITAL SCREENING BILATERAL MAMMOGRAM WITH TOMO AND CAD  COMPARISON:  Previous exam(s).  ACR Breast Density Category b: There are scattered areas of fibroglandular density.  FINDINGS: There are no findings suspicious for malignancy. Images were processed with CAD.  IMPRESSION: No mammographic evidence of malignancy. A result letter of this screening mammogram will be mailed directly to the patient.  RECOMMENDATION: Screening mammogram in one year. (Code:[id number])  BI-RADS CATEGORY  1: Negative.  Electronically Signed   By: Lillia Mountain M.D.   On: 02/08/2020 12:15 MM Outside Films Mammo This examination belongs to an outside facility and is stored here for  comparison purposes only.  Contact the originating outside institution for  any associated report or interpretation. MM Outside Films Mammo This examination belongs to an outside facility and is stored here for  comparison purposes only.  Contact the originating outside institution for  any associated report or interpretation. MM Outside Films Mammo This examination belongs to an outside facility and is stored here for  comparison purposes only.  Contact the originating outside institution for  any associated report or interpretation. MM Outside Films Mammo This examination belongs to an outside facility and is stored here for  comparison purposes only.  Contact the originating outside institution for  any associated report or interpretation. MM Outside Films Mammo This examination belongs to an outside facility and is stored here for  comparison purposes only.  Contact the originating outside institution for  any associated report or  interpretation. Note: Reviewed        Physical Exam  General appearance: Well nourished, well developed, and well hydrated. In no apparent acute distress Mental status: Alert, oriented x 3 (person, place, & time)       Respiratory: No evidence of acute respiratory distress Eyes: PERLA Vitals: BP (!) 152/77   Pulse 68   Temp (!) 97.5 F (36.4 C) (Temporal)   Resp 18   Ht _0  (1.499 m)   Wt 194 lb (88 kg)   SpO2 99%   BMI 39.18 kg/m  BMI: Estimated body mass index is 39.18 kg/m as calculated from the following:   Height as of this encounter: _1  (1.499 m).   Weight as of this encounter: 194 lb (88 kg). Ideal: Patient must be at least 60 in tall to calculate ideal body weight   Lumbar Spine Area Exam  Skin & Axial Inspection:No masses, redness, or swelling Alignment:Symmetrical Functional WUJ:WJXB restricted ROMaffecting both sides Stability:No instability detected Muscle Tone/Strength:Functionally intact. No obvious neuro-muscular anomalies detected. Sensory (Neurological):Musculoskeletal pain pattern  Gait & Posture Assessment  Ambulation:Unassisted Gait:Relatively normal for age and body habitus Posture:WNL  Lower Extremity Exam    Side:Right lower extremity  Side:Left lower extremity  Stability:No instability observed  Stability:No instability observed  Skin & Extremity Inspection:Skin color, temperature, and hair growth are WNL. No peripheral edema or cyanosis. No masses, redness, swelling, asymmetry, or associated skin lesions. No contractures.  Skin & Extremity Inspection:Skin color, temperature, and hair growth are WNL. No peripheral edema or cyanosis. No masses, redness, swelling, asymmetry, or associated skin lesions. No contractures.  Functional JYN:[id number] ROM   Functional HQI:ONGEXBMWUXLK ROM   Muscle Tone/Strength:Functionally intact. No  obvious neuro-muscular anomalies  detected.  Muscle Tone/Strength:Functionally intact. No obvious neuro-muscular anomalies detected.  Sensory (Neurological):Musculoskeletal pain pattern  Sensory (Neurological):Musculoskeletal pain pattern  DTR: Patellar:deferred today Achilles:deferred today Plantar:deferred today  DTR: Patellar:deferred today Achilles:deferred today Plantar:deferred today  Palpation:No palpable anomalies  Palpation:No palpable anomalies     Assessment   Status Diagnosis  Controlled Controlled Controlled 1. Fibromyalgia   2. Thoracic myelopathy (due to syrinx, s/p excision 12/23/2017)   3. Chronic bilateral low back pain without sciatica   4. Syrinx of spinal cord ([place])   5. History of total knee arthroplasty, left   6. Chronic pain of both hips   7. Lumbosacral spondylosis without myelopathy   8. Primary osteoarthritis of both knees   9. Chronic pain syndrome       Plan of Care  Kristin Porter has a current medication list which includes the following long-term medication(s): albuterol, amlodipine, fluticasone, hydrochlorothiazide, paroxetine, omeprazole, pregabalin, and zolpidem.  Pharmacotherapy (Medications Ordered): Meds ordered this encounter  Medications  . baclofen (LIORESAL) 10 MG tablet    Sig: Take 1 tablet (10 mg total) by mouth 4 (four) times daily as needed for muscle spasms.    Dispense:  120 each    Refill:  5  . pregabalin (LYRICA) 75 MG capsule    Sig: Take 2 capsules (150 mg total) by mouth at bedtime.    Dispense:  60 capsule    Refill:  5    Follow-up plan:   Return in about 6 months (around 10/20/2020) for Medication Management, in person.   Recent Visits Date Type Provider Dept  02/18/20 Office Visit Gillis Santa, MD [facility]  Showing recent visits within past 90 days and meeting all other requirements Today's Visits Date Type Provider Dept  04/21/20 Office Visit Gillis Santa, MD [facility]   Showing today's visits and meeting all other requirements Future Appointments No visits were found meeting these conditions. Showing future appointments within next 90 days and meeting all other requirements  I discussed the assessment and treatment plan with the patient. The patient was provided an opportunity to ask questions and all were answered. The patient agreed with the plan and demonstrated an understanding of the instructions.  Patient advised to call back or seek an in-person evaluation if the symptoms or condition worsens.  Duration of encounter92mnutes.  Note by: BGillis Santa MD Date: 04/21/2020; Time: 11:11 AM

## 2020-04-21 NOTE — Patient Instructions (Signed)
Baclofen and Lyrica have been escribed to your pharmacy.

## 2020-06-02 ENCOUNTER — Other Ambulatory Visit
Admission: RE | Admit: 2020-06-02 | Discharge: 2020-06-02 | Disposition: A | Discharge status: Home / Self Care | Payer: Medicare Other | Attending: Cardiology | Admitting: Cardiology

## 2020-06-02 DIAGNOSIS — E7800 Pure hypercholesterolemia, unspecified: Secondary | ICD-10-CM | POA: Diagnosis not present

## 2020-06-02 LAB — LIPID PANEL
Cholesterol: 214 mg/dL — ABNORMAL HIGH (ref 0–200)
HDL: 52 mg/dL (ref >40)
LDL Cholesterol: 147 mg/dL — ABNORMAL HIGH (ref 0–99)
Total CHOL/HDL Ratio: 4.1 RATIO
Triglycerides: 76 mg/dL (ref <150)
VLDL: 15 mg/dL (ref 0–40)

## 2020-06-03 ENCOUNTER — Encounter: Payer: Self-pay | Admitting: *Deleted

## 2020-06-06 ENCOUNTER — Ambulatory Visit: Payer: Medicare Other | Admitting: Cardiology

## 2020-06-07 ENCOUNTER — Telehealth (INDEPENDENT_AMBULATORY_CARE_PROVIDER_SITE_OTHER): Payer: Medicare Other | Admitting: Cardiology

## 2020-06-07 ENCOUNTER — Other Ambulatory Visit: Payer: Self-pay

## 2020-06-07 ENCOUNTER — Encounter: Payer: Self-pay | Admitting: Cardiology

## 2020-06-07 VITALS — Ht 59.0 in | Wt 198.0 lb

## 2020-06-07 DIAGNOSIS — E7800 Pure hypercholesterolemia, unspecified: Secondary | ICD-10-CM | POA: Diagnosis not present

## 2020-06-07 DIAGNOSIS — K2100 Gastro-esophageal reflux disease with esophagitis, without bleeding: Secondary | ICD-10-CM

## 2020-06-07 DIAGNOSIS — Z6839 Body mass index (BMI) 39.0-39.9, adult: Secondary | ICD-10-CM

## 2020-06-07 DIAGNOSIS — I10 Essential (primary) hypertension: Secondary | ICD-10-CM

## 2020-06-07 MED ORDER — OMEPRAZOLE 40 MG PO CPDR: 40.0000 mg | DELAYED_RELEASE_CAPSULE | Freq: Two times a day (BID) | ORAL | 3 refills | Status: DC

## 2020-06-07 NOTE — Progress Notes (Signed)
Virtual Visit via Telephone Note   This visit type was conducted due to national recommendations for restrictions regarding the COVID-19 Pandemic (e.g. social distancing) in an effort to limit this patient's exposure and mitigate transmission in our community.  Due to her co-morbid illnesses, this patient is at least at moderate risk for complications without adequate follow up.  This format is felt to be most appropriate for this patient at this time.  The patient did not have access to video technology/had technical difficulties with video requiring transitioning to audio format only (telephone).  All issues noted in this document were discussed and addressed.  No physical exam could be performed with this format.  Please refer to the patient's chart for her  consent to telehealth for [facility].   Date:  06/07/2020   ID:  [name], DOB [birth year], MRN [id number]  Patient Location: Home Provider Location: Office/Clinic  PCP:  Verl Bangs, FNP  Cardiologist:  Kate Sable, MD  Electrophysiologist:  None   Evaluation Performed:  Follow-Up Visit  Chief Complaint: Cholesterol levels, reflux  History of Present Illness:    Kristin Porter is a 60 y.o. female with history of hypertension, hyperlipidemia, GERD, obesity who follows up due to hyperlipidemia.  Deviously seen for elevated cholesterol levels, elevated LDL, triglycerides despite being on high intensity statin..  Denies other cardiac symptoms.  Was started on PCSK9 for additional cholesterol control.  Tolerating Repatha without adverse effects.  States having worsening reflux symptoms.  Currently takes omeprazole 40 mg daily.  The patient does not have symptoms concerning for COVID-19 infection (fever, chills, cough, or new shortness of breath).    Past Medical History:  Diagnosis Date  . Arthritis    Bilateral hips  . Fibromyalgia   . Hypertension   . Neuropathy    Past Surgical History:   Procedure Laterality Date  . BREAST BIOPSY Right 2018   benign, clip was placed     Current Meds  Medication Sig  . albuterol (PROVENTIL) (2.5 MG/3ML) 0.083% nebulizer solution Take 2.5 mg by nebulization every 6 (six) hours as needed for wheezing or shortness of breath.  Marland Kitchen amLODipine (NORVASC) 10 MG tablet Take 1 tablet (10 mg total) by mouth daily.  . baclofen (LIORESAL) 10 MG tablet Take 1 tablet (10 mg total) by mouth 4 (four) times daily as needed for muscle spasms.  . Blood Pressure Monitoring (BLOOD PRESSURE KIT) DEVI 1 Units by Does not apply route in the morning and at bedtime.  . Evolocumab (REPATHA SURECLICK) 998 MG/ML SOAJ Inject 1 pen into the skin every 14 (fourteen) days.  . fluticasone (FLONASE) 50 MCG/ACT nasal spray Place 1 spray into both nostrils 2 (two) times daily.  . hydrochlorothiazide (HYDRODIURIL) 12.5 MG tablet Take 1 tablet (12.5 mg total) by mouth daily.  . meloxicam (MOBIC) 15 MG tablet Take 1 tablet (15 mg total) by mouth daily as needed for pain.  Marland Kitchen NARCAN 4 MG/0.1ML LIQD nasal spray kit CALL 911. ADMINISTER A SINGLE SPRAY OF NARCAN IN ONE NOSTRIL. REPEAT EVERY 3 MINUTES AS NEEDED IF NO OR MINIMAL RESPONSE.  Marland Kitchen omeprazole (PRILOSEC) 40 MG capsule Take 1 capsule (40 mg total) by mouth in the morning and at bedtime.  Marland Kitchen PARoxetine (PAXIL) 10 MG tablet Take 1 tablet (10 mg total) by mouth daily.  . pregabalin (LYRICA) 75 MG capsule Take 2 capsules (150 mg total) by mouth at bedtime.  . [DISCONTINUED] omeprazole (PRILOSEC) 40 MG capsule Take 1 capsule (40  mg total) by mouth daily as needed.     Allergies:   Tizanidine, Lisinopril, and Tramadol   Social History   Tobacco Use  . Smoking status: Former Smoker    Quit date: 1970    Years since quitting: 52.0  . Smokeless tobacco: Never Used  Vaping Use  . Vaping Use: Never used  Substance Use Topics  . Alcohol use: Not Currently  . Drug use: Never     Family Hx: The patient's Family history is unknown by  patient.  ROS:   Please see the history of present illness.     All other systems reviewed and are negative.   Prior CV studies:   The following studies were reviewed today:    Labs/Other Tests and Data Reviewed:    EKG:  No ECG reviewed.  Recent Labs: 09/17/2019: TSH 1.11 03/22/2020: ALT 18; BUN 15; Creat 0.71; Hemoglobin 14.1; Platelets 319; Potassium 4.4; Sodium 140   Recent Lipid Panel Lab Results  Component Value Date/Time   CHOL 214 (H) 06/02/2020 01:36 PM   TRIG 76 06/02/2020 01:36 PM   HDL 52 06/02/2020 01:36 PM   CHOLHDL 4.1 06/02/2020 01:36 PM   LDLCALC 147 (H) 06/02/2020 01:36 PM   LDLCALC 167 (H) 03/22/2020 09:11 AM    Wt Readings from Last 3 Encounters:  06/07/20 198 lb (89.8 kg)  04/21/20 194 lb (88 kg)  04/04/20 204 lb 4 oz (92.6 kg)     Objective:    Vital Signs:  Ht '4\' 11"'  (1.499 m)   Wt 198 lb (89.8 kg)   BMI 39.99 kg/m    VITAL SIGNS:  reviewed  ASSESSMENT & PLAN:    1. Hyperlipidemia, cholesterol values much improved from prior.  Continue  and Repatha as prescribed.  Plan to repeat lipid panel in 3 months. 2. Hypertension, continue HCTZ, amlodipine for BP control. 3. Obesity, low-cholesterol diet, low calorie diet, weight loss advised. 4. GERD symptoms, increase omeprazole to 40 mg twice daily.  Follow-up with PCP if symptoms persist.  Follow-up in 3 months after fasting lipid profile  COVID-19 Education: The signs and symptoms of COVID-19 were discussed with the patient and how to seek care for testing (follow up with PCP or arrange E-visit).  The importance of social distancing was discussed today.  Time:   Today, I have spent 35 minutes with the patient with telehealth technology discussing the above problems.     Medication Adjustments/Labs and Tests Ordered: Current medicines are reviewed at length with the patient today.  Concerns regarding medicines are outlined above.   Tests Ordered: Orders Placed This Encounter   Procedures  . Lipid panel    Medication Changes: Meds ordered this encounter  Medications  . omeprazole (PRILOSEC) 40 MG capsule    Sig: Take 1 capsule (40 mg total) by mouth in the morning and at bedtime.    Dispense:  60 capsule    Refill:  3    Follow Up:  In Person in 3 month(s)  Signed, Kate Sable, MD  06/07/2020 11:49 AM    [place]

## 2020-06-07 NOTE — Patient Instructions (Signed)
Medication Instructions:  Your physician has recommended you make the following change in your medication:  1- INCREASE Protonix to 40 mg by mouth two times a day.  *If you need a refill on your cardiac medications before your next appointment, please call your pharmacy*  Lab Work: Your physician recommends that you return for lab work in: 3 months  (around September 05, 2020) - Will be checking your cholesterol levels.  - You will need to be fasting. Please do not have anything to eat or drink after midnight the morning you have the lab work. You may only have water or black coffee with no cream or sugar. - Please go to the [facility]. You will check in at the front desk to the right as you walk into the atrium. Valet Parking is offered if needed. - No appointment needed. You may go any day between 7 am and 6 pm.  If you have labs (blood work) drawn today and your tests are completely normal, you will receive your results only by: Marland Kitchen MyChart Message (if you have MyChart) OR . A paper copy in the mail If you have any lab test that is abnormal or we need to change your treatment, we will call you to review the results.  Testing/Procedures: none  Follow-Up: At [facility], you and your health needs are our priority.  As part of our continuing mission to provide you with exceptional heart care, we have created designated Provider Care Teams.  These Care Teams include your primary Cardiologist (physician) and Advanced Practice Providers (APPs -  Physician Assistants and Nurse Practitioners) who all work together to provide you with the care you need, when you need it.  We recommend signing up for the patient portal called "MyChart".  Sign up information is provided on this After Visit Summary.  MyChart is used to connect with patients for Virtual Visits (Telemedicine).  Patients are able to view lab/test results, encounter notes, upcoming appointments, etc.  Non-urgent messages can be sent  to your provider as well.   To learn more about what you can do with MyChart, go to [email or web address].    Your next appointment:   3 month(s) - make sure you have had your fasting lab work sometime within the week or so prior to coming to your follow up appointment.   The format for your next appointment:   In Person  Provider:   You may see Kate Sable, MD or one of the following Advanced Practice Providers on your designated Care Team:    Murray Hodgkins, NP  Christell Faith, PA-C  Marrianne Mood, PA-C  Cadence [place], [place]  Laurann Montana, NP

## 2020-06-19 DIAGNOSIS — I10 Essential (primary) hypertension: Secondary | ICD-10-CM | POA: Diagnosis not present

## 2020-06-19 DIAGNOSIS — M797 Fibromyalgia: Secondary | ICD-10-CM | POA: Diagnosis not present

## 2020-06-19 DIAGNOSIS — Z885 Allergy status to narcotic agent status: Secondary | ICD-10-CM | POA: Diagnosis not present

## 2020-06-19 DIAGNOSIS — M549 Dorsalgia, unspecified: Secondary | ICD-10-CM | POA: Diagnosis not present

## 2020-06-19 DIAGNOSIS — Z79899 Other long term (current) drug therapy: Secondary | ICD-10-CM | POA: Diagnosis not present

## 2020-06-19 DIAGNOSIS — Z888 Allergy status to other drugs, medicaments and biological substances status: Secondary | ICD-10-CM | POA: Diagnosis not present

## 2020-06-20 ENCOUNTER — Telehealth: Payer: Self-pay

## 2020-06-20 NOTE — Telephone Encounter (Signed)
No changes will be made without an appointment.  Please schedule the patient an appointment to discuss medication changes.

## 2020-06-20 NOTE — Telephone Encounter (Signed)
Pt is requesting a stronger pain med. Says what she has is not working for her. She is scheduled to return in May 2022

## 2020-06-29 ENCOUNTER — Ambulatory Visit
Payer: Medicare Other | Attending: Student in an Organized Health Care Education/Training Program | Admitting: Student in an Organized Health Care Education/Training Program

## 2020-06-29 ENCOUNTER — Other Ambulatory Visit: Payer: Self-pay

## 2020-06-29 ENCOUNTER — Encounter: Payer: Self-pay | Admitting: Student in an Organized Health Care Education/Training Program

## 2020-06-29 VITALS — BP 139/88 | HR 102 | Temp 97.7°F | Resp 16 | Ht 59.0 in | Wt 198.0 lb

## 2020-06-29 DIAGNOSIS — G950 Syringomyelia and syringobulbia: Secondary | ICD-10-CM | POA: Diagnosis not present

## 2020-06-29 DIAGNOSIS — M25551 Pain in right hip: Secondary | ICD-10-CM | POA: Diagnosis not present

## 2020-06-29 DIAGNOSIS — M797 Fibromyalgia: Secondary | ICD-10-CM | POA: Diagnosis not present

## 2020-06-29 DIAGNOSIS — M545 Low back pain, unspecified: Secondary | ICD-10-CM | POA: Diagnosis not present

## 2020-06-29 DIAGNOSIS — G894 Chronic pain syndrome: Secondary | ICD-10-CM | POA: Diagnosis not present

## 2020-06-29 DIAGNOSIS — G8929 Other chronic pain: Secondary | ICD-10-CM

## 2020-06-29 DIAGNOSIS — M47817 Spondylosis without myelopathy or radiculopathy, lumbosacral region: Secondary | ICD-10-CM | POA: Diagnosis not present

## 2020-06-29 DIAGNOSIS — M4714 Other spondylosis with myelopathy, thoracic region: Secondary | ICD-10-CM

## 2020-06-29 DIAGNOSIS — Z96652 Presence of left artificial knee joint: Secondary | ICD-10-CM

## 2020-06-29 DIAGNOSIS — M25552 Pain in left hip: Secondary | ICD-10-CM

## 2020-06-29 MED ORDER — MELOXICAM 15 MG PO TABS: 15.0000 mg | ORAL_TABLET | Freq: Every day | ORAL | 5 refills | Status: DC | PRN

## 2020-06-29 MED ORDER — HYDROCODONE-ACETAMINOPHEN 10-325 MG PO TABS: 1.0000 | ORAL_TABLET | Freq: Two times a day (BID) | ORAL | 0 refills | Status: AC | PRN

## 2020-06-29 NOTE — Progress Notes (Signed)
PROVIDER NOTE: Information contained herein reflects review and annotations entered in association with encounter. Interpretation of such information and data should be left to medically-trained personnel. Information provided to patient can be located elsewhere in the medical record under "Patient Instructions". Document created using STT-dictation technology, any transcriptional errors that may result from process are unintentional.    Patient: Kristin Porter  Service Category: E/M  Provider: Gillis Santa, MD  DOB: [birth year]  DOS: 06/29/2020  Specialty: Interventional Pain Management  MRN: [id number]  Setting: Ambulatory outpatient  PCP: Verl Bangs, FNP  Type: Established Patient    Referring Provider: Verl Bangs, FNP  Location: Office  Delivery: Face-to-face     HPI  Kristin Porter, a 60 y.o. year old female, is here today because of her Fibromyalgia [M79.7]. Kristin Porter primary complain today is Back Pain (low) and Hip Pain (bilateral) Last encounter: My last encounter with her was on 04/21/2020. Pertinent problems: Kristin Porter has Chronic pain of both hips; Depression; Fibromyalgia; History of total knee arthroplasty, left; Lumbosacral spondylosis without myelopathy; Multiple joint pain; OA (osteoarthritis) of knee; and Chronic pain syndrome on their pertinent problem list. Pain Assessment: Severity of Chronic pain is reported as a 7 /10. Location: Back Lower/blateral hips. Onset: More than a month ago. Quality: Pounding,Constant,Aching. Timing: Constant. Modifying factor(s): medications, sitting stretching. Vitals:  height is '4\' 11"'  (1.499 m) and weight is 198 lb (89.8 kg). Her temporal temperature is 97.7 F (36.5 C). Her blood pressure is 139/88 and her pulse is 102 (abnormal). Her respiration is 16 and oxygen saturation is 99%.   Reason for encounter: medication management.    ED visit on 1/30 for increased mid thoracic pain related to fibromyalgia. No inciting or  traumatic event. States that this pain flare was different since it was higher than her typical lumbar back pain. Related to fibromyalgia flare, pt received Toradol and Valium as well as Lidocaine patches in ED and was discharged. Feels that pain flare is improving overall.  Patient is requesting a slight increase her hydrocodone to optimize her pain management which is a little bit more challenging for her in the winter months for her fibromyalgia.  She continues multimodal analgesics with baclofen 10 mg 4 times daily as needed, Mobic 15 mg daily as well as Lyrica 150 mg nightly.  She is also on Paxil 10 mg for depression.   Pharmacotherapy Assessment   Analgesic: Increase hydrocodone from 7.5 mg twice daily as needed to 10 mg twice daily as needed. Monitoring: [place] PMP: PDMP reviewed during this encounter.       Pharmacotherapy: No side-effects or adverse reactions reported. Compliance: No problems identified. Effectiveness: Clinically acceptable.  Hart Rochester, RN  06/29/2020  1:51 PM  Sign when Signing Visit Nursing Pain Medication Assessment:  Safety precautions to be maintained throughout the outpatient stay will include: orient to surroundings, keep bed in low position, maintain call bell within reach at all times, provide assistance with transfer out of bed and ambulation.  Medication Inspection Compliance: Pill count conducted under aseptic conditions, in front of the patient. Neither the pills nor the bottle was removed from the patient's sight at any time. Once count was completed pills were immediately returned to the patient in their original bottle.  Medication: Hydrocodone/APAP Pill/Patch Count: 6 of 60 pills remain Pill/Patch Appearance: Markings consistent with prescribed medication Bottle Appearance: Standard pharmacy container. Clearly labeled. Filled Date: 90 / 02 / 2021 Last Medication intake:  Day before yesterday  UDS:  Summary  Date Value Ref Range Status   09/10/2019 Note  Final    Comment:    ==================================================================== Compliance Drug Analysis, Ur ==================================================================== Specimen Alert Note: Urinary creatinine is low; ability to detect some drugs may be compromised. Interpret results with caution. (Creatinine) ==================================================================== Test                             Result       Flag       Units Drug Present and Declared for Prescription Verification   Tramadol                       2744         EXPECTED   ng/mg creat   O-Desmethyltramadol            3256         EXPECTED   ng/mg creat    Source of tramadol is a prescription medication. O-desmethyltramadol    is an expected metabolite of tramadol. Drug Present not Declared for Prescription Verification   Acetaminophen                  PRESENT      UNEXPECTED   Diphenhydramine                PRESENT      UNEXPECTED Drug Absent but Declared for Prescription Verification   Pregabalin                     Not Detected UNEXPECTED   Baclofen                       Not Detected UNEXPECTED   Amitriptyline                  Not Detected UNEXPECTED   Paroxetine                     Not Detected UNEXPECTED   Trazodone                      Not Detected UNEXPECTED   Diclofenac                     Not Detected UNEXPECTED    Topical diclofenac, as indicated in the declared medication list, is    not always detected even when used as directed. ==================================================================== Test                      Result    Flag   Units      Ref Range   Creatinine              16        LL     mg/dL      >=20 ==================================================================== Declared Medications:  The flagging and interpretation on this report are based on the  following declared medications.  Unexpected results may arise from  inaccuracies in the  declared medications.  **Note: The testing scope of this panel includes these medications:  Amitriptyline  Baclofen  Paroxetine  Pregabalin  Tramadol  Trazodone  **Note: The testing scope of this panel does not include small to  moderate amounts of these reported medications:  Topical Diclofenac (Flector)  **Note: The testing scope of this panel does not include the  following  reported medications:  Albuterol  Amlodipine  Atorvastatin  Fluticasone (Flonase)  Losartan  Meloxicam  Naloxone  Omeprazole  Undefined Miscellaneous Drug ==================================================================== For clinical consultation, please call [phone]. ====================================================================      ROS  Constitutional: Denies any fever or chills Gastrointestinal: No reported hemesis, hematochezia, vomiting, or acute GI distress Musculoskeletal: Thoracic and lumbar back pain Neurological: No reported episodes of acute onset apraxia, aphasia, dysarthria, agnosia, amnesia, paralysis, loss of coordination, or loss of consciousness  Medication Review  Blood Pressure Kit, Evolocumab, HYDROcodone-acetaminophen, PARoxetine, albuterol, amLODipine, baclofen, fluticasone, hydrochlorothiazide, meloxicam, omeprazole, and pregabalin  History Review  Allergy: Ms. Sheahan is allergic to tizanidine, lisinopril, and tramadol. Drug: Ms. Towe  reports no history of drug use. Alcohol:  reports previous alcohol use. Tobacco:  reports that she quit smoking about 52 years ago. She has never used smokeless tobacco. Social: Ms. Batley  reports that she quit smoking about 52 years ago. She has never used smokeless tobacco. She reports previous alcohol use. She reports that she does not use drugs. Medical:  has a past medical history of Arthritis, Fibromyalgia, Hypertension, and Neuropathy. Surgical: Ms. Broadfoot  has a past surgical history that includes Breast biopsy  (Right, 2018). Family: Family history is unknown by patient.  Laboratory Chemistry Profile   Renal Lab Results  Component Value Date   BUN 15 03/22/2020   CREATININE 0.71 20/25/4270   BCR NOT APPLICABLE 62/37/6283   GFRAA 107 03/22/2020   GFRNONAA 92 03/22/2020     Hepatic Lab Results  Component Value Date   AST 17 03/22/2020   ALT 18 03/22/2020     Electrolytes Lab Results  Component Value Date   NA 140 03/22/2020   K 4.4 03/22/2020   CL 103 03/22/2020   CALCIUM 9.7 03/22/2020     Bone No results found for: VD25OH, VD125OH2TOT, TD1761YW7, PX1062IR4, 25OHVITD1, 25OHVITD2, 25OHVITD3, TESTOFREE, TESTOSTERONE   Inflammation (CRP: Acute Phase) (ESR: Chronic Phase) No results found for: CRP, ESRSEDRATE, LATICACIDVEN     Note: Above Lab results reviewed.  Recent Imaging Review  MM 3D SCREEN BREAST BILATERAL CLINICAL DATA:  Screening.  EXAM: DIGITAL SCREENING BILATERAL MAMMOGRAM WITH TOMO AND CAD  COMPARISON:  Previous exam(s).  ACR Breast Density Category b: There are scattered areas of fibroglandular density.  FINDINGS: There are no findings suspicious for malignancy. Images were processed with CAD.  IMPRESSION: No mammographic evidence of malignancy. A result letter of this screening mammogram will be mailed directly to the patient.  RECOMMENDATION: Screening mammogram in one year. (Code:[id number]-B-01Y)  BI-RADS CATEGORY  1: Negative.  Electronically Signed   By: Lillia Mountain M.D.   On: 02/08/2020 12:15 MM Outside Films Mammo This examination belongs to an outside facility and is stored here for  comparison purposes only.  Contact the originating outside institution for  any associated report or interpretation. MM Outside Films Mammo This examination belongs to an outside facility and is stored here for  comparison purposes only.  Contact the originating outside institution for  any associated report or interpretation. MM Outside Films Mammo This  examination belongs to an outside facility and is stored here for  comparison purposes only.  Contact the originating outside institution for  any associated report or interpretation. MM Outside Films Mammo This examination belongs to an outside facility and is stored here for  comparison purposes only.  Contact the originating outside institution for  any associated report or interpretation. MM Outside Films Mammo This examination belongs to an outside facility and  is stored here for  comparison purposes only.  Contact the originating outside institution for  any associated report or interpretation. Note: Reviewed        Physical Exam  General appearance: Well nourished, well developed, and well hydrated. In no apparent acute distress Mental status: Alert, oriented x 3 (person, place, & time)       Respiratory: No evidence of acute respiratory distress Eyes: PERLA Vitals: BP 139/88 (BP Location: Right Arm, Patient Position: Sitting, Cuff Size: Large)   Pulse (!) 102   Temp 97.7 F (36.5 C) (Temporal)   Resp 16   Ht '4\' 11"'  (1.499 m)   Wt 198 lb (89.8 kg)   SpO2 99%   BMI 39.99 kg/m  BMI: Estimated body mass index is 39.99 kg/m as calculated from the following:   Height as of this encounter: '4\' 11"'  (1.499 m).   Weight as of this encounter: 198 lb (89.8 kg). Ideal: Patient must be at least 60 in tall to calculate ideal body weight  Lumbar Spine Area Exam  Skin & Axial Inspection:No masses, redness, or swelling Alignment:Symmetrical Functional XAJ:OINO restricted ROMaffecting both sides Stability:No instability detected Muscle Tone/Strength:Functionally intact. No obvious neuro-muscular anomalies detected. Sensory (Neurological):Musculoskeletal pain pattern  Gait & Posture Assessment  Ambulation:Unassisted Gait:Relatively normal for age and body habitus Posture:WNL  Lower Extremity Exam    Side:Right lower extremity  Side:Left lower extremity   Stability:No instability observed  Stability:No instability observed  Skin & Extremity Inspection:Skin color, temperature, and hair growth are WNL. No peripheral edema or cyanosis. No masses, redness, swelling, asymmetry, or associated skin lesions. No contractures.  Skin & Extremity Inspection:Skin color, temperature, and hair growth are WNL. No peripheral edema or cyanosis. No masses, redness, swelling, asymmetry, or associated skin lesions. No contractures.  Functional MVE:[id number] ROM   Functional MOQ:[id number] ROM   Muscle Tone/Strength:Functionally intact. No obvious neuro-muscular anomalies detected.  Muscle Tone/Strength:Functionally intact. No obvious neuro-muscular anomalies detected.  Sensory (Neurological):Musculoskeletal pain pattern  Sensory (Neurological):Musculoskeletal pain pattern  DTR: Patellar:deferred today Achilles:deferred today Plantar:deferred today  DTR: Patellar:deferred today Achilles:deferred today Plantar:deferred today  Palpation:No palpable anomalies  Palpation:No palpable anomalies     Assessment   Status Diagnosis  Having a Flare-up Controlled Controlled 1. Fibromyalgia   2. Thoracic myelopathy (due to syrinx, s/p excision 12/23/2017)   3. Chronic bilateral low back pain without sciatica   4. Syrinx of spinal cord ([place])   5. History of total knee arthroplasty, left   6. Chronic pain of both hips   7. Lumbosacral spondylosis without myelopathy   8. Chronic pain syndrome       Plan of Care   Ms. Jonie O Ordaz has a current medication list which includes the following long-term medication(s): albuterol, amlodipine, fluticasone, hydrochlorothiazide, omeprazole, paroxetine, and pregabalin.  Pharmacotherapy (Medications Ordered): Meds ordered this encounter  Medications  . meloxicam (MOBIC) 15 MG tablet    Sig: Take 1 tablet (15 mg total) by mouth  daily as needed for pain.    Dispense:  30 tablet    Refill:  5  . HYDROcodone-acetaminophen (NORCO) 10-325 MG tablet    Sig: Take 1 tablet by mouth 2 (two) times daily as needed for severe pain. Must last 30 days.    Dispense:  60 tablet    Refill:  0    Chronic Pain. (STOP Act - Not applicable). Fill one day early if closed on scheduled refill date.  Marland Kitchen HYDROcodone-acetaminophen (NORCO) 10-325 MG tablet    Sig: Take 1  tablet by mouth 2 (two) times daily as needed for severe pain. Must last 30 days.    Dispense:  60 tablet    Refill:  0    Chronic Pain. (STOP Act - Not applicable). Fill one day early if closed on scheduled refill date.   Follow-up plan:   Return if symptoms worsen or fail to improve.   Recent Visits Date Type Provider Dept  04/21/20 Office Visit Gillis Santa, MD [facility]  Showing recent visits within past 90 days and meeting all other requirements Today's Visits Date Type Provider Dept  06/29/20 Office Visit Gillis Santa, MD [facility]  Showing today's visits and meeting all other requirements Future Appointments No visits were found meeting these conditions. Showing future appointments within next 90 days and meeting all other requirements  I discussed the assessment and treatment plan with the patient. The patient was provided an opportunity to ask questions and all were answered. The patient agreed with the plan and demonstrated an understanding of the instructions.  Patient advised to call back or seek an in-person evaluation if the symptoms or condition worsens.  Duration of encounter: 10mnutes.  Note by: BGillis Santa MD Date: 06/29/2020; Time: 2:33 PM

## 2020-06-29 NOTE — Progress Notes (Signed)
Nursing Pain Medication Assessment:  Safety precautions to be maintained throughout the outpatient stay will include: orient to surroundings, keep bed in low position, maintain call bell within reach at all times, provide assistance with transfer out of bed and ambulation.  Medication Inspection Compliance: Pill count conducted under aseptic conditions, in front of the patient. Neither the pills nor the bottle was removed from the patient's sight at any time. Once count was completed pills were immediately returned to the patient in their original bottle.  Medication: Hydrocodone/APAP Pill/Patch Count: 6 of 60 pills remain Pill/Patch Appearance: Markings consistent with prescribed medication Bottle Appearance: Standard pharmacy container. Clearly labeled. Filled Date: 62 / 02 / 2021 Last Medication intake:  Day before yesterday

## 2020-08-26 ENCOUNTER — Other Ambulatory Visit: Payer: Self-pay

## 2020-08-26 DIAGNOSIS — M797 Fibromyalgia: Secondary | ICD-10-CM

## 2020-08-26 DIAGNOSIS — G894 Chronic pain syndrome: Secondary | ICD-10-CM

## 2020-08-26 DIAGNOSIS — I10 Essential (primary) hypertension: Secondary | ICD-10-CM

## 2020-08-26 DIAGNOSIS — F339 Major depressive disorder, recurrent, unspecified: Secondary | ICD-10-CM

## 2020-08-26 MED ORDER — PAROXETINE HCL 10 MG PO TABS: 10.0000 mg | ORAL_TABLET | Freq: Every day | ORAL | 0 refills | Status: DC

## 2020-08-26 MED ORDER — HYDROCHLOROTHIAZIDE 12.5 MG PO TABS: 12.5000 mg | ORAL_TABLET | Freq: Every day | ORAL | 0 refills | Status: DC

## 2020-08-26 MED ORDER — AMLODIPINE BESYLATE 10 MG PO TABS: 10.0000 mg | ORAL_TABLET | Freq: Every day | ORAL | 1 refills | Status: DC

## 2020-08-26 MED ORDER — PREGABALIN 75 MG PO CAPS: 150.0000 mg | ORAL_CAPSULE | Freq: Every day | ORAL | 2 refills | Status: DC

## 2020-09-05 ENCOUNTER — Telehealth: Payer: Self-pay | Admitting: *Deleted

## 2020-09-05 ENCOUNTER — Ambulatory Visit (INDEPENDENT_AMBULATORY_CARE_PROVIDER_SITE_OTHER): Payer: Medicare Other | Admitting: Cardiology

## 2020-09-05 ENCOUNTER — Encounter: Payer: Self-pay | Admitting: Cardiology

## 2020-09-05 ENCOUNTER — Other Ambulatory Visit: Payer: Self-pay

## 2020-09-05 VITALS — BP 118/86 | HR 73 | Ht 59.0 in | Wt 207.0 lb

## 2020-09-05 DIAGNOSIS — E7800 Pure hypercholesterolemia, unspecified: Secondary | ICD-10-CM

## 2020-09-05 DIAGNOSIS — I10 Essential (primary) hypertension: Secondary | ICD-10-CM

## 2020-09-05 NOTE — Telephone Encounter (Signed)
Pt has been approved for Repatha 140 mg/ml.  Request Reference Number: [id number]. REPATHA SURE INJ 140MG /ML is approved through 05/20/2021. Your patient may now fill this prescription and it will be covered.

## 2020-09-05 NOTE — Progress Notes (Signed)
Cardiology Office Note:    Date:  09/05/2020   ID:  [name], DOB 1961-01-23, MRN [id number]  PCP:  Verl Bangs, [place]  Cardiologist:  Kate Sable, MD  Advanced Practice Provider:  No care team member to display Electrophysiologist:  None       Referring MD: Verl Bangs, FNP   Chief Complaint  Patient presents with  . Other    3 month follow up - Patient c/o left shoulder pain that runs up her neck and numbness in hands when sleeping. Meds reviewed verbally with patient.     History of Present Illness:    Karime AZKA STEGER is a 60 y.o. female with a hx of hypertension, hyperlipidemia, GERD, obesity who presents for follow-up.  Previously seen due to hyperlipidemia.  Previously on Lipitor, this was stopped due to muscle aches, making her fibromyalgia worse.  She was started on Repatha which she currently tolerates.  Previous cholesterol levels show marked improvement.  Repeat cholesterol level pending, she forgot to obtain these last week.  She otherwise feels well, blood pressure is well controlled.  Past Medical History:  Diagnosis Date  . Arthritis    Bilateral hips  . Fibromyalgia   . Hypertension   . Neuropathy     Past Surgical History:  Procedure Laterality Date  . BREAST BIOPSY Right 2018   benign, clip was placed    Current Medications: Current Meds  Medication Sig  . albuterol (PROVENTIL) (2.5 MG/3ML) 0.083% nebulizer solution Take 2.5 mg by nebulization every 6 (six) hours as needed for wheezing or shortness of breath.  Marland Kitchen amLODipine (NORVASC) 10 MG tablet Take 1 tablet (10 mg total) by mouth daily.  . baclofen (LIORESAL) 10 MG tablet Take 1 tablet (10 mg total) by mouth 4 (four) times daily as needed for muscle spasms.  . Blood Pressure Monitoring (BLOOD PRESSURE KIT) DEVI 1 Units by Does not apply route in the morning and at bedtime.  . Evolocumab (REPATHA SURECLICK) 235 MG/ML SOAJ Inject 1 pen  into the skin every 14 (fourteen) days.  . fluticasone (FLONASE) 50 MCG/ACT nasal spray Place 1 spray into both nostrils 2 (two) times daily.  . hydrochlorothiazide (HYDRODIURIL) 12.5 MG tablet Take 1 tablet (12.5 mg total) by mouth daily.  Marland Kitchen HYDROcodone-acetaminophen (NORCO) 10-325 MG tablet Take 1 tablet by mouth 2 (two) times daily as needed for severe pain. Must last 30 days.  . meloxicam (MOBIC) 15 MG tablet Take 1 tablet (15 mg total) by mouth daily as needed for pain.  Marland Kitchen omeprazole (PRILOSEC) 40 MG capsule Take 1 capsule (40 mg total) by mouth in the morning and at bedtime.  Marland Kitchen PARoxetine (PAXIL) 10 MG tablet Take 1 tablet (10 mg total) by mouth daily.  . pregabalin (LYRICA) 75 MG capsule Take 2 capsules (150 mg total) by mouth at bedtime.     Allergies:   Tizanidine, Lisinopril, and Tramadol   Social History   Socioeconomic History  . Marital status: Divorced    Spouse name: Not on file  . Number of children: Not on file  . Years of education: Not on file  . Highest education level: Not on file  Occupational History  . Occupation: disability   Tobacco Use  . Smoking status: Former Smoker    Quit date: 1970    Years since quitting: 52.3  . Smokeless tobacco: Never Used  Vaping Use  . Vaping Use: Never used  Substance and  Sexual Activity  . Alcohol use: Not Currently  . Drug use: Never  . Sexual activity: Not on file  Other Topics Concern  . Not on file  Social History Narrative  . Not on file   Social Determinants of Health   Financial Resource Strain: Low Risk   . Difficulty of Paying Living Expenses: Not hard at all  Food Insecurity: No Food Insecurity  . Worried About Charity fundraiser in the Last Year: Never true  . Ran Out of Food in the Last Year: Never true  Transportation Needs: No Transportation Needs  . Lack of Transportation (Medical): No  . Lack of Transportation (Non-Medical): No  Physical Activity: Inactive  . Days of Exercise per Week: 0 days   . Minutes of Exercise per Session: 0 min  Stress: Not on file  Social Connections: Socially Isolated  . Frequency of Communication with Friends and Family: More than three times a week  . Frequency of Social Gatherings with Friends and Family: More than three times a week  . Attends Religious Services: Never  . Active Member of Clubs or Organizations: No  . Attends Archivist Meetings: Never  . Marital Status: Divorced     Family History: The patient's Family history is unknown by patient.  ROS:   Please see the history of present illness.     All other systems reviewed and are negative.  EKGs/Labs/Other Studies Reviewed:    The following studies were reviewed today:   EKG:  EKG is  ordered today.  The ekg ordered today demonstrates normal sinus rhythm  Recent Labs: 09/17/2019: TSH 1.11 03/22/2020: ALT 18; BUN 15; Creat 0.71; Hemoglobin 14.1; Platelets 319; Potassium 4.4; Sodium 140  Recent Lipid Panel    Component Value Date/Time   CHOL 214 (H) 06/02/2020 1336   TRIG 76 06/02/2020 1336   HDL 52 06/02/2020 1336   CHOLHDL 4.1 06/02/2020 1336   VLDL 15 06/02/2020 1336   LDLCALC 147 (H) 06/02/2020 1336   LDLCALC 167 (H) 03/22/2020 0911     Risk Assessment/Calculations:      Physical Exam:    VS:  BP 118/86 (BP Location: Left Arm, Patient Position: Sitting, Cuff Size: Large)   Pulse 73   Ht $R'4\' 11"'DO$  (1.499 m)   Wt 207 lb (93.9 kg)   SpO2 97%   BMI 41.81 kg/m     Wt Readings from Last 3 Encounters:  09/05/20 207 lb (93.9 kg)  06/29/20 198 lb (89.8 kg)  06/07/20 198 lb (89.8 kg)     GEN:  Well nourished, well developed in no acute distress HEENT: Normal NECK: No JVD; No carotid bruits LYMPHATICS: No lymphadenopathy CARDIAC: RRR, no murmurs, rubs, gallops RESPIRATORY:  Clear to auscultation without rales, wheezing or rhonchi  ABDOMEN: Soft, non-tender, non-distended MUSCULOSKELETAL:  No edema; No deformity  SKIN: Warm and dry NEUROLOGIC:  Alert  and oriented x 3 PSYCHIATRIC:  Normal affect   ASSESSMENT:    1. Pure hypercholesterolemia   2. Primary hypertension   3. Morbid obesity ([place])    PLAN:    In order of problems listed above:  1. Hyperlipidemia, statin intolerance, continue Repatha.  Repeat fasting lipid profile.  Call patient regarding medication adjustments if needed. 2. Hypertension, BP controlled.  Continue current BP meds. 3. Morbid obesity, low-calorie diet, weight loss advised.  Follow-up in 6 months.     Medication Adjustments/Labs and Tests Ordered: Current medicines are reviewed at length with the patient today.  Concerns  regarding medicines are outlined above.  Orders Placed This Encounter  Procedures  . EKG 12-Lead   No orders of the defined types were placed in this encounter.   Patient Instructions  Medication Instructions:   Your physician recommends that you continue on your current medications as directed. Please refer to the Current Medication list given to you today.  *If you need a refill on your cardiac medications before your next appointment, please call your pharmacy*   Lab Work: None ordered If you have labs (blood work) drawn today and your tests are completely normal, you will receive your results only by: Marland Kitchen MyChart Message (if you have MyChart) OR . A paper copy in the mail If you have any lab test that is abnormal or we need to change your treatment, we will call you to review the results.   Testing/Procedures: None ordered   Follow-Up: At [facility], you and your health needs are our priority.  As part of our continuing mission to provide you with exceptional heart care, we have created designated Provider Care Teams.  These Care Teams include your primary Cardiologist (physician) and Advanced Practice Providers (APPs -  Physician Assistants and Nurse Practitioners) who all work together to provide you with the care you need, when you need it.  We recommend signing  up for the patient portal called "MyChart".  Sign up information is provided on this After Visit Summary.  MyChart is used to connect with patients for Virtual Visits (Telemedicine).  Patients are able to view lab/test results, encounter notes, upcoming appointments, etc.  Non-urgent messages can be sent to your provider as well.   To learn more about what you can do with MyChart, go to [email or web address].    Your next appointment:   6 month(s)  The format for your next appointment:   In Person  Provider:   Kate Sable, MD   Other Instructions      Signed, Kate Sable, MD  09/05/2020 4:28 PM    [place]

## 2020-09-05 NOTE — Telephone Encounter (Signed)
Pt requiring PA for Repatha 140 mg/ml. PA has been submitted via Covermymeds. Awaiting Approval.  Please wait for [employer] 2017 NCPDP to return a determination.

## 2020-09-05 NOTE — Patient Instructions (Signed)

## 2020-09-08 ENCOUNTER — Other Ambulatory Visit
Admission: RE | Admit: 2020-09-08 | Discharge: 2020-09-08 | Disposition: A | Discharge status: Home / Self Care | Payer: Medicare Other | Attending: Cardiology | Admitting: Cardiology

## 2020-09-08 DIAGNOSIS — E7800 Pure hypercholesterolemia, unspecified: Secondary | ICD-10-CM

## 2020-09-08 LAB — LIPID PANEL
Cholesterol: 145 mg/dL (ref 0–200)
HDL: 47 mg/dL (ref >40)
LDL Cholesterol: 81 mg/dL (ref 0–99)
Total CHOL/HDL Ratio: 3.1 RATIO
Triglycerides: 87 mg/dL (ref <150)
VLDL: 17 mg/dL (ref 0–40)

## 2020-09-09 ENCOUNTER — Telehealth: Payer: Self-pay

## 2020-09-09 NOTE — Telephone Encounter (Signed)
Spoke with patient and informed her of the Lipid Panel results.

## 2020-10-13 ENCOUNTER — Other Ambulatory Visit: Payer: Self-pay

## 2020-10-13 ENCOUNTER — Ambulatory Visit
Payer: Medicare Other | Attending: Student in an Organized Health Care Education/Training Program | Admitting: Student in an Organized Health Care Education/Training Program

## 2020-10-13 ENCOUNTER — Encounter: Payer: Self-pay | Admitting: Student in an Organized Health Care Education/Training Program

## 2020-10-13 VITALS — BP 155/98 | HR 90 | Temp 97.7°F | Resp 16 | Ht 59.0 in | Wt 198.0 lb

## 2020-10-13 DIAGNOSIS — Z96652 Presence of left artificial knee joint: Secondary | ICD-10-CM | POA: Diagnosis present

## 2020-10-13 DIAGNOSIS — G8929 Other chronic pain: Secondary | ICD-10-CM | POA: Diagnosis present

## 2020-10-13 DIAGNOSIS — M25551 Pain in right hip: Secondary | ICD-10-CM

## 2020-10-13 DIAGNOSIS — M797 Fibromyalgia: Secondary | ICD-10-CM | POA: Diagnosis not present

## 2020-10-13 DIAGNOSIS — M4714 Other spondylosis with myelopathy, thoracic region: Secondary | ICD-10-CM | POA: Diagnosis not present

## 2020-10-13 DIAGNOSIS — M25552 Pain in left hip: Secondary | ICD-10-CM | POA: Diagnosis present

## 2020-10-13 DIAGNOSIS — M545 Low back pain, unspecified: Secondary | ICD-10-CM | POA: Diagnosis not present

## 2020-10-13 DIAGNOSIS — M47817 Spondylosis without myelopathy or radiculopathy, lumbosacral region: Secondary | ICD-10-CM | POA: Insufficient documentation

## 2020-10-13 DIAGNOSIS — G950 Syringomyelia and syringobulbia: Secondary | ICD-10-CM

## 2020-10-13 DIAGNOSIS — G894 Chronic pain syndrome: Secondary | ICD-10-CM | POA: Diagnosis present

## 2020-10-13 DIAGNOSIS — M170 Bilateral primary osteoarthritis of knee: Secondary | ICD-10-CM | POA: Diagnosis present

## 2020-10-13 MED ORDER — BACLOFEN 10 MG PO TABS: 10.0000 mg | ORAL_TABLET | Freq: Four times a day (QID) | ORAL | 5 refills | Status: DC | PRN

## 2020-10-13 MED ORDER — MELOXICAM 15 MG PO TABS: 15.0000 mg | ORAL_TABLET | Freq: Every day | ORAL | 5 refills | Status: DC | PRN

## 2020-10-13 MED ORDER — HYDROCODONE-ACETAMINOPHEN 10-325 MG PO TABS: 1.0000 | ORAL_TABLET | Freq: Two times a day (BID) | ORAL | 0 refills | Status: AC | PRN

## 2020-10-13 NOTE — Progress Notes (Signed)
PROVIDER NOTE: Information contained herein reflects review and annotations entered in association with encounter. Interpretation of such information and data should be left to medically-trained personnel. Information provided to patient can be located elsewhere in the medical record under "Patient Instructions". Document created using STT-dictation technology, any transcriptional errors that may result from process are unintentional.    Patient: Kristin Porter  Service Category: E/M  Provider: Gillis Santa, MD  DOB: 06/16/1960  DOS: 10/13/2020  Specialty: Interventional Pain Management  MRN: [id number]  Setting: Ambulatory outpatient  PCP: Verl Bangs, FNP  Type: Established Patient    Referring Provider: Verl Bangs, FNP  Location: Office  Delivery: Face-to-face     HPI  Ms. Minda [employer], a 60 y.o. year old female, is here today because of her Fibromyalgia [M79.7]. Ms. Wormley primary complain today is Back Pain Last encounter: My last encounter with her was on 06/29/20 Pertinent problems: Ms. Zylka has Chronic pain of both hips; Depression; Fibromyalgia; History of total knee arthroplasty, left; Lumbosacral spondylosis without myelopathy; Multiple joint pain; OA (osteoarthritis) of knee; and Chronic pain syndrome on their pertinent problem list. Pain Assessment: Severity of Chronic pain is reported as a 8 /10. Location: Back Lower/denies. Onset: More than a month ago. Quality:  (stings). Timing: Constant. Modifying factor(s): rest, meds, heat and col. Vitals:  height is '4\' 11"'  (1.499 m) and weight is 198 lb (89.8 kg). Her temperature is 97.7 F (36.5 C). Her blood pressure is 155/98 (abnormal) and her pulse is 90. Her respiration is 16 and oxygen saturation is 98%.   Reason for encounter: medication management.     Patient had a fall at [facility] last month where she injured her back however that is improving.  Polymyalgias and arthralgias pronounced to low back, bilateral  hips.  History of fibromyalgia and lumbar spondylosis.  Refill of hydrocodone, baclofen, Mobic as below no change in dose.  Follow-up in 2 months.   Pharmacotherapy Assessment   Analgesic:hydrocodone 10 mg twice daily as needed. Monitoring: [place] PMP: PDMP reviewed during this encounter.       Pharmacotherapy: No side-effects or adverse reactions reported. Compliance: No problems identified. Effectiveness: Clinically acceptable.  Ignatius Specking, RN  10/13/2020 12:53 PM  Sign when Signing Visit Nursing Pain Medication Assessment:  Safety precautions to be maintained throughout the outpatient stay will include: orient to surroundings, keep bed in low position, maintain call bell within reach at all times, provide assistance with transfer out of bed and ambulation.  Medication Inspection Compliance: Pill count conducted under aseptic conditions, in front of the patient. Neither the pills nor the bottle was removed from the patient's sight at any time. Once count was completed pills were immediately returned to the patient in their original bottle.  Medication: See above Pill/Patch Count: 6 of 60 pills remain Pill/Patch Appearance: Markings consistent with prescribed medication Bottle Appearance: Standard pharmacy container. Clearly labeled. Filled Date: 4 / 06 / 2022 Last Medication intake:  Yesterday    UDS:  Summary  Date Value Ref Range Status  09/10/2019 Note  Final    Comment:    ==================================================================== Compliance Drug Analysis, Ur ==================================================================== Specimen Alert Note: Urinary creatinine is low; ability to detect some drugs may be compromised. Interpret results with caution. (Creatinine) ==================================================================== Test                             Result       Flag  Units Drug Present and Declared for Prescription Verification    Tramadol                       2744         EXPECTED   ng/mg creat   O-Desmethyltramadol            3256         EXPECTED   ng/mg creat    Source of tramadol is a prescription medication. O-desmethyltramadol    is an expected metabolite of tramadol. Drug Present not Declared for Prescription Verification   Acetaminophen                  PRESENT      UNEXPECTED   Diphenhydramine                PRESENT      UNEXPECTED Drug Absent but Declared for Prescription Verification   Pregabalin                     Not Detected UNEXPECTED   Baclofen                       Not Detected UNEXPECTED   Amitriptyline                  Not Detected UNEXPECTED   Paroxetine                     Not Detected UNEXPECTED   Trazodone                      Not Detected UNEXPECTED   Diclofenac                     Not Detected UNEXPECTED    Topical diclofenac, as indicated in the declared medication list, is    not always detected even when used as directed. ==================================================================== Test                      Result    Flag   Units      Ref Range   Creatinine              16        LL     mg/dL      >=20 ==================================================================== Declared Medications:  The flagging and interpretation on this report are based on the  following declared medications.  Unexpected results may arise from  inaccuracies in the declared medications.  **Note: The testing scope of this panel includes these medications:  Amitriptyline  Baclofen  Paroxetine  Pregabalin  Tramadol  Trazodone  **Note: The testing scope of this panel does not include small to  moderate amounts of these reported medications:  Topical Diclofenac (Flector)  **Note: The testing scope of this panel does not include the  following reported medications:  Albuterol  Amlodipine  Atorvastatin  Fluticasone (Flonase)  Losartan  Meloxicam  Naloxone  Omeprazole  Undefined  Miscellaneous Drug ==================================================================== For clinical consultation, please call [phone]. ====================================================================      ROS  Constitutional: Denies any fever or chills Gastrointestinal: No reported hemesis, hematochezia, vomiting, or acute GI distress Musculoskeletal: Thoracic and lumbar back pain Neurological: No reported episodes of acute onset apraxia, aphasia, dysarthria, agnosia, amnesia, paralysis, loss of coordination, or loss of consciousness  Medication Review  Blood Pressure Kit, Evolocumab, HYDROcodone-acetaminophen, PARoxetine,  albuterol, amLODipine, baclofen, fluticasone, hydrochlorothiazide, meloxicam, omeprazole, and pregabalin  History Review  Allergy: Ms. Benecke is allergic to tizanidine, lisinopril, and tramadol. Drug: Ms. Siravo  reports no history of drug use. Alcohol:  reports previous alcohol use. Tobacco:  reports that she quit smoking about 52 years ago. She has never used smokeless tobacco. Social: Ms. Arutyunyan  reports that she quit smoking about 52 years ago. She has never used smokeless tobacco. She reports previous alcohol use. She reports that she does not use drugs. Medical:  has a past medical history of Arthritis, Fibromyalgia, Hypertension, and Neuropathy. Surgical: Ms. Brave  has a past surgical history that includes Breast biopsy (Right, 2018). Family: Family history is unknown by patient.  Laboratory Chemistry Profile   Renal Lab Results  Component Value Date   BUN 15 03/22/2020   CREATININE 0.71 38/87/1961   BCR NOT APPLICABLE 74/71/8550   GFRAA 107 03/22/2020   GFRNONAA 92 03/22/2020     Hepatic Lab Results  Component Value Date   AST 17 03/22/2020   ALT 18 03/22/2020     Electrolytes Lab Results  Component Value Date   NA 140 03/22/2020   K 4.4 03/22/2020   CL 103 03/22/2020   CALCIUM 9.7 03/22/2020     Bone No results found  for: VD25OH, VD125OH2TOT, ZT8682BR4, VT5521VG7, 25OHVITD1, 25OHVITD2, 25OHVITD3, TESTOFREE, TESTOSTERONE   Inflammation (CRP: Acute Phase) (ESR: Chronic Phase) No results found for: CRP, ESRSEDRATE, LATICACIDVEN     Note: Above Lab results reviewed.  Recent Imaging Review  MM 3D SCREEN BREAST BILATERAL CLINICAL DATA:  Screening.  EXAM: DIGITAL SCREENING BILATERAL MAMMOGRAM WITH TOMO AND CAD  COMPARISON:  Previous exam(s).  ACR Breast Density Category b: There are scattered areas of fibroglandular density.  FINDINGS: There are no findings suspicious for malignancy. Images were processed with CAD.  IMPRESSION: No mammographic evidence of malignancy. A result letter of this screening mammogram will be mailed directly to the patient.  RECOMMENDATION: Screening mammogram in one year. (Code:SM-B-01Y)  BI-RADS CATEGORY  1: Negative.  Electronically Signed   By: Lillia Mountain M.D.   On: 02/08/2020 12:15 MM Outside Films Mammo This examination belongs to an outside facility and is stored here for  comparison purposes only.  Contact the originating outside institution for  any associated report or interpretation. MM Outside Films Mammo This examination belongs to an outside facility and is stored here for  comparison purposes only.  Contact the originating outside institution for  any associated report or interpretation. MM Outside Films Mammo This examination belongs to an outside facility and is stored here for  comparison purposes only.  Contact the originating outside institution for  any associated report or interpretation. MM Outside Films Mammo This examination belongs to an outside facility and is stored here for  comparison purposes only.  Contact the originating outside institution for  any associated report or interpretation. MM Outside Films Mammo This examination belongs to an outside facility and is stored here for  comparison purposes only.  Contact the  originating outside institution for  any associated report or interpretation. Note: Reviewed        Physical Exam  General appearance: Well nourished, well developed, and well hydrated. In no apparent acute distress Mental status: Alert, oriented x 3 (person, place, & time)       Respiratory: No evidence of acute respiratory distress Eyes: PERLA Vitals: BP (!) 155/98 Comment: Has not taken BP med today  Pulse 90   Temp 97.7 F (  36.5 C)   Resp 16   Ht '4\' 11"'  (1.499 m)   Wt 198 lb (89.8 kg)   SpO2 98%   BMI 39.99 kg/m  BMI: Estimated body mass index is 39.99 kg/m as calculated from the following:   Height as of this encounter: '4\' 11"'  (1.499 m).   Weight as of this encounter: 198 lb (89.8 kg). Ideal: Patient must be at least 60 in tall to calculate ideal body weight  Lumbar Spine Area Exam  Skin & Axial Inspection:No masses, redness, or swelling Alignment:Symmetrical Functional ZOX:WRUE restricted ROMaffecting both sides Stability:No instability detected Muscle Tone/Strength:Functionally intact. No obvious neuro-muscular anomalies detected. Sensory (Neurological):Musculoskeletal pain pattern  Gait & Posture Assessment  Ambulation:Unassisted Gait:Relatively normal for age and body habitus Posture:WNL  Lower Extremity Exam    Side:Right lower extremity  Side:Left lower extremity  Stability:No instability observed  Stability:No instability observed  Skin & Extremity Inspection:Skin color, temperature, and hair growth are WNL. No peripheral edema or cyanosis. No masses, redness, swelling, asymmetry, or associated skin lesions. No contractures.  Skin & Extremity Inspection:Skin color, temperature, and hair growth are WNL. No peripheral edema or cyanosis. No masses, redness, swelling, asymmetry, or associated skin lesions. No contractures.  Functional AVW:[id number] ROM   Functional FAO:ZHYQMVHQIONG ROM    Muscle Tone/Strength:Functionally intact. No obvious neuro-muscular anomalies detected.  Muscle Tone/Strength:Functionally intact. No obvious neuro-muscular anomalies detected.  Sensory (Neurological):Musculoskeletal pain pattern  Sensory (Neurological):Musculoskeletal pain pattern  DTR: Patellar:deferred today Achilles:deferred today Plantar:deferred today  DTR: Patellar:deferred today Achilles:deferred today Plantar:deferred today  Palpation:No palpable anomalies  Palpation:No palpable anomalies     Assessment   Status Diagnosis  Having a Flare-up Controlled Controlled 1. Fibromyalgia   2. Thoracic myelopathy (due to syrinx, s/p excision 12/23/2017)   3. Chronic bilateral low back pain without sciatica   4. Syrinx of spinal cord ([place])   5. History of total knee arthroplasty, left   6. Chronic pain of both hips   7. Lumbosacral spondylosis without myelopathy   8. Chronic pain syndrome   9. Primary osteoarthritis of both knees       Plan of Care   Ms. Raechelle O Walmsley has a current medication list which includes the following long-term medication(s): albuterol, amlodipine, fluticasone, hydrochlorothiazide, omeprazole, paroxetine, and pregabalin.  Pharmacotherapy (Medications Ordered): Meds ordered this encounter  Medications  . baclofen (LIORESAL) 10 MG tablet    Sig: Take 1 tablet (10 mg total) by mouth 4 (four) times daily as needed for muscle spasms.    Dispense:  120 each    Refill:  5  . meloxicam (MOBIC) 15 MG tablet    Sig: Take 1 tablet (15 mg total) by mouth daily as needed for pain.    Dispense:  30 tablet    Refill:  5  . HYDROcodone-acetaminophen (NORCO) 10-325 MG tablet    Sig: Take 1 tablet by mouth every 12 (twelve) hours as needed for severe pain. Must last 30 days.    Dispense:  60 tablet    Refill:  0    Chronic Pain. (STOP Act - Not applicable). Fill one day early if closed on scheduled refill date.   Orders Placed  This Encounter  Procedures  . ToxASSURE Select 13 (MW), Urine    Volume: 30 ml(s). Minimum 3 ml of urine is needed. Document temperature of fresh sample. Indications: Long term (current) use of opiate analgesic (E95.284)    Order Specific Question:   Release to patient    Answer:   Immediate  .  Ambulatory referral to Physical Therapy    Referral Priority:   Routine    Referral Type:   Physical Medicine    Referral Reason:   Specialty Services Required    Requested Specialty:   Physical Therapy    Number of Visits Requested:   1   Follow-up plan:   Return in about 12 weeks (around 01/05/2021) for Medication Management, in person.   Recent Visits No visits were found meeting these conditions. Showing recent visits within past 90 days and meeting all other requirements Today's Visits Date Type Provider Dept  10/13/20 Office Visit Gillis Santa, MD [facility]  Showing today's visits and meeting all other requirements Future Appointments No visits were found meeting these conditions. Showing future appointments within next 90 days and meeting all other requirements  I discussed the assessment and treatment plan with the patient. The patient was provided an opportunity to ask questions and all were answered. The patient agreed with the plan and demonstrated an understanding of the instructions.  Patient advised to call back or seek an in-person evaluation if the symptoms or condition worsens.  Duration of encounter: 19mnutes.  Note by: BGillis Santa MD Date: 10/13/2020; Time: 1:05 PM

## 2020-10-13 NOTE — Progress Notes (Signed)
Nursing Pain Medication Assessment:  Safety precautions to be maintained throughout the outpatient stay will include: orient to surroundings, keep bed in low position, maintain call bell within reach at all times, provide assistance with transfer out of bed and ambulation.  Medication Inspection Compliance: Pill count conducted under aseptic conditions, in front of the patient. Neither the pills nor the bottle was removed from the patient's sight at any time. Once count was completed pills were immediately returned to the patient in their original bottle.  Medication: See above Pill/Patch Count: 6 of 60 pills remain Pill/Patch Appearance: Markings consistent with prescribed medication Bottle Appearance: Standard pharmacy container. Clearly labeled. Filled Date: 4 / 06 / 2022 Last Medication intake:  Yesterday

## 2020-10-18 ENCOUNTER — Ambulatory Visit (INDEPENDENT_AMBULATORY_CARE_PROVIDER_SITE_OTHER): Payer: Medicare Other

## 2020-10-18 VITALS — Ht 59.0 in | Wt 198.0 lb

## 2020-10-18 DIAGNOSIS — Z0000 Encounter for general adult medical examination without abnormal findings: Secondary | ICD-10-CM | POA: Diagnosis not present

## 2020-10-18 NOTE — Patient Instructions (Signed)
Kristin Porter , Thank you for taking time to come for your Medicare Wellness Visit. I appreciate your ongoing commitment to your health goals. Please review the following plan we discussed and let me know if I can assist you in the future.   Screening recommendations/referrals: Colonoscopy: completed 03/10/2019 Recommended yearly ophthalmology/optometry visit for glaucoma screening and checkup Recommended yearly dental visit for hygiene and checkup  Vaccinations: Influenza vaccine: decline Pneumococcal vaccine: n/a Tdap vaccine: completed 04/24/2018, due 04/24/2028 Shingles vaccine: discussed   Covid-19:  decline  Advanced directives: Advance directive discussed with you today. .  Conditions/risks identified: none  Next appointment: Follow up in one year for your annual wellness visit.   Preventive Care 62 Years and Older, Female Preventive care refers to lifestyle choices and visits with your health care provider that can promote health and wellness. What does preventive care include?  A yearly physical exam. This is also called an annual well check.  Dental exams once or twice a year.  Routine eye exams. Ask your health care provider how often you should have your eyes checked.  Personal lifestyle choices, including:  Daily care of your teeth and gums.  Regular physical activity.  Eating a healthy diet.  Avoiding tobacco and drug use.  Limiting alcohol use.  Practicing safe sex.  Taking low doses of aspirin every day.  Taking vitamin and mineral supplements as recommended by your health care provider. What happens during an annual well check? The services and screenings done by your health care provider during your annual well check will depend on your age, overall health, lifestyle risk factors, and family history of disease. Counseling  Your health care provider may ask you questions about your:  Alcohol use.  Tobacco use.  Drug use.  Emotional  well-being.  Home and relationship well-being.  Sexual activity.  Eating habits.  History of falls.  Memory and ability to understand (cognition).  Work and work Statistician. Screening  You may have the following tests or measurements:  Height, weight, and BMI.  Blood pressure.  Lipid and cholesterol levels. These may be checked every 5 years, or more frequently if you are over 67 years old.  Skin check.  Lung cancer screening. You may have this screening every year starting at age 70 if you have a 30-pack-year history of smoking and currently smoke or have quit within the past 15 years.  Fecal occult blood test (FOBT) of the stool. You may have this test every year starting at age 64.  Flexible sigmoidoscopy or colonoscopy. You may have a sigmoidoscopy every 5 years or a colonoscopy every 10 years starting at age 26.  Prostate cancer screening. Recommendations will vary depending on your family history and other risks.  Hepatitis C blood test.  Hepatitis B blood test.  Sexually transmitted disease (STD) testing.  Diabetes screening. This is done by checking your blood sugar (glucose) after you have not eaten for a while (fasting). You may have this done every 1-3 years.  Abdominal aortic aneurysm (AAA) screening. You may need this if you are a current or former smoker.  Osteoporosis. You may be screened starting at age 88 if you are at high risk. Talk with your health care provider about your test results, treatment options, and if necessary, the need for more tests. Vaccines  Your health care provider may recommend certain vaccines, such as:  Influenza vaccine. This is recommended every year.  Tetanus, diphtheria, and acellular pertussis (Tdap, Td) vaccine. You may need a  Td booster every 10 years.  Zoster vaccine. You may need this after age 41.  Pneumococcal 13-valent conjugate (PCV13) vaccine. One dose is recommended after age 78.  Pneumococcal  polysaccharide (PPSV23) vaccine. One dose is recommended after age 81. Talk to your health care provider about which screenings and vaccines you need and how often you need them. This information is not intended to replace advice given to you by your health care provider. Make sure you discuss any questions you have with your health care provider. Document Released: 06/03/2015 Document Revised: 01/25/2016 Document Reviewed: 03/08/2015 Elsevier Interactive Patient Education  2017 [place] Prevention in the Home Falls can cause injuries. They can happen to people of all ages. There are many things you can do to make your home safe and to help prevent falls. What can I do on the outside of my home?  Regularly fix the edges of walkways and driveways and fix any cracks.  Remove anything that might make you trip as you walk through a door, such as a raised step or threshold.  Trim any bushes or trees on the path to your home.  Use bright outdoor lighting.  Clear any walking paths of anything that might make someone trip, such as rocks or tools.  Regularly check to see if handrails are loose or broken. Make sure that both sides of any steps have handrails.  Any raised decks and porches should have guardrails on the edges.  Have any leaves, snow, or ice cleared regularly.  Use sand or salt on walking paths during winter.  Clean up any spills in your garage right away. This includes oil or grease spills. What can I do in the bathroom?  Use night lights.  Install grab bars by the toilet and in the tub and shower. Do not use towel bars as grab bars.  Use non-skid mats or decals in the tub or shower.  If you need to sit down in the shower, use a plastic, non-slip stool.  Keep the floor dry. Clean up any water that spills on the floor as soon as it happens.  Remove soap buildup in the tub or shower regularly.  Attach bath mats securely with double-sided non-slip rug  tape.  Do not have throw rugs and other things on the floor that can make you trip. What can I do in the bedroom?  Use night lights.  Make sure that you have a light by your bed that is easy to reach.  Do not use any sheets or blankets that are too big for your bed. They should not hang down onto the floor.  Have a firm chair that has side arms. You can use this for support while you get dressed.  Do not have throw rugs and other things on the floor that can make you trip. What can I do in the kitchen?  Clean up any spills right away.  Avoid walking on wet floors.  Keep items that you use a lot in easy-to-reach places.  If you need to reach something above you, use a strong step stool that has a grab bar.  Keep electrical cords out of the way.  Do not use floor polish or wax that makes floors slippery. If you must use wax, use non-skid floor wax.  Do not have throw rugs and other things on the floor that can make you trip. What can I do with my stairs?  Do not leave any items on the stairs.  Make sure that there are handrails on both sides of the stairs and use them. Fix handrails that are broken or loose. Make sure that handrails are as long as the stairways.  Check any carpeting to make sure that it is firmly attached to the stairs. Fix any carpet that is loose or worn.  Avoid having throw rugs at the top or bottom of the stairs. If you do have throw rugs, attach them to the floor with carpet tape.  Make sure that you have a light switch at the top of the stairs and the bottom of the stairs. If you do not have them, ask someone to add them for you. What else can I do to help prevent falls?  Wear shoes that:  Do not have high heels.  Have rubber bottoms.  Are comfortable and fit you well.  Are closed at the toe. Do not wear sandals.  If you use a stepladder:  Make sure that it is fully opened. Do not climb a closed stepladder.  Make sure that both sides of the  stepladder are locked into place.  Ask someone to hold it for you, if possible.  Clearly mark and make sure that you can see:  Any grab bars or handrails.  First and last steps.  Where the edge of each step is.  Use tools that help you move around (mobility aids) if they are needed. These include:  Canes.  Walkers.  Scooters.  Crutches.  Turn on the lights when you go into a dark area. Replace any light bulbs as soon as they burn out.  Set up your furniture so you have a clear path. Avoid moving your furniture around.  If any of your floors are uneven, fix them.  If there are any pets around you, be aware of where they are.  Review your medicines with your doctor. Some medicines can make you feel dizzy. This can increase your chance of falling. Ask your doctor what other things that you can do to help prevent falls. This information is not intended to replace advice given to you by your health care provider. Make sure you discuss any questions you have with your health care provider. Document Released: 03/03/2009 Document Revised: 10/13/2015 Document Reviewed: 06/11/2014 Elsevier Interactive Patient Education  2017 [employer].

## 2020-10-18 NOTE — Progress Notes (Signed)
I connected with Kristin Porter today by telephone and verified that I am speaking with the correct person using two identifiers. Location patient: home Location provider: work Persons participating in the virtual visit: Kristin Porter movement psychotherapist, Glenna Durand LPN.   I discussed the limitations, risks, security and privacy concerns of performing an evaluation and management service by telephone and the availability of in person appointments. I also discussed with the patient that there may be a patient responsible charge related to this service. The patient expressed understanding and verbally consented to this telephonic visit.    Interactive audio and video telecommunications were attempted between this provider and patient, however failed, due to patient having technical difficulties OR patient did not have access to video capability.  We continued and completed visit with audio only.     Vital signs may be patient reported or missing.  Subjective:   Kristin Porter is a 60 y.o. female who presents for Medicare Annual (Subsequent) preventive examination.  Review of Systems     Cardiac Risk Factors include: dyslipidemia;hypertension;obesity (BMI >30kg/m2);sedentary lifestyle     Objective:    Today's Vitals   10/18/20 1445 10/18/20 1446  Weight: 198 lb (89.8 kg)   Height: 4' 11" (1.499 m)   PainSc:  8    Body mass index is 39.99 kg/m.  Advanced Directives 10/18/2020 04/21/2020 12/29/2019 10/13/2019 08/24/2019 12/23/2018 03/09/2018  Does Patient Have a Medical Advance Directive? No No No Yes No No Yes  Type of Advance Directive - - - - - - Press photographer  Does patient want to make changes to medical advance directive? - - Yes (MAU/Ambulatory/Procedural Areas - Information given) - - No - Patient declined -  Copy of [place] in Chart? - - - - - - No - copy requested  Would patient like information on creating a medical advance directive? - - - - No -  Patient declined No - Patient declined -    Current Medications (verified) Outpatient Encounter Medications as of 10/18/2020  Medication Sig  . albuterol (PROVENTIL) (2.5 MG/3ML) 0.083% nebulizer solution Take 2.5 mg by nebulization every 6 (six) hours as needed for wheezing or shortness of breath.  Marland Kitchen amLODipine (NORVASC) 10 MG tablet Take 1 tablet (10 mg total) by mouth daily.  . baclofen (LIORESAL) 10 MG tablet Take 1 tablet (10 mg total) by mouth 4 (four) times daily as needed for muscle spasms.  . Blood Pressure Monitoring (BLOOD PRESSURE KIT) DEVI 1 Units by Does not apply route in the morning and at bedtime.  . Evolocumab (REPATHA SURECLICK) 476 MG/ML SOAJ Inject 1 pen into the skin every 14 (fourteen) days.  . fluticasone (FLONASE) 50 MCG/ACT nasal spray Place 1 spray into both nostrils 2 (two) times daily.  . hydrochlorothiazide (HYDRODIURIL) 12.5 MG tablet Take 1 tablet (12.5 mg total) by mouth daily.  Derrill Memo ON 11/18/2020] HYDROcodone-acetaminophen (NORCO) 10-325 MG tablet Take 1 tablet by mouth every 12 (twelve) hours as needed for severe pain. Must last 30 days.  . meloxicam (MOBIC) 15 MG tablet Take 1 tablet (15 mg total) by mouth daily as needed for pain.  Marland Kitchen omeprazole (PRILOSEC) 40 MG capsule Take 1 capsule (40 mg total) by mouth in the morning and at bedtime.  Marland Kitchen PARoxetine (PAXIL) 10 MG tablet Take 1 tablet (10 mg total) by mouth daily.  . pregabalin (LYRICA) 75 MG capsule Take 2 capsules (150 mg total) by mouth at bedtime.   No facility-administered encounter medications on  file as of 10/18/2020.    Allergies (verified) Tizanidine, Lisinopril, and Tramadol   History: Past Medical History:  Diagnosis Date  . Arthritis    Bilateral hips  . Fibromyalgia   . Hypertension   . Neuropathy    Past Surgical History:  Procedure Laterality Date  . BREAST BIOPSY Right 2018   benign, clip was placed   Family History  Family history unknown: Yes   Social History    Socioeconomic History  . Marital status: Divorced    Spouse name: Not on file  . Number of children: Not on file  . Years of education: Not on file  . Highest education level: Not on file  Occupational History  . Occupation: disability   Tobacco Use  . Smoking status: Former Smoker    Quit date: 1970    Years since quitting: 52.4  . Smokeless tobacco: Never Used  Vaping Use  . Vaping Use: Never used  Substance and Sexual Activity  . Alcohol use: Not Currently  . Drug use: Never  . Sexual activity: Not Currently  Other Topics Concern  . Not on file  Social History Narrative  . Not on file   Social Determinants of Health   Financial Resource Strain: Low Risk   . Difficulty of Paying Living Expenses: Not hard at all  Food Insecurity: No Food Insecurity  . Worried About Charity fundraiser in the Last Year: Never true  . Ran Out of Food in the Last Year: Never true  Transportation Needs: No Transportation Needs  . Lack of Transportation (Medical): No  . Lack of Transportation (Non-Medical): No  Physical Activity: Inactive  . Days of Exercise per Week: 0 days  . Minutes of Exercise per Session: 0 min  Stress: No Stress Concern Present  . Feeling of Stress : Not at all  Social Connections: Not on file    Tobacco Counseling Counseling given: Not Answered   Clinical Intake:  Pre-visit preparation completed: Yes  Pain : 0-10 Pain Score: 8  Pain Type: Chronic pain Pain Location: Generalized Pain Descriptors / Indicators: Aching,Tingling,Throbbing,Stabbing Pain Onset: More than a month ago Pain Frequency: Constant     Nutritional Status: BMI > 30  Obese Nutritional Risks: None Diabetes: No  How often do you need to have someone help you when you read instructions, pamphlets, or other written materials from your doctor or pharmacy?: 1 - Never What is the last grade level you completed in school?: 11th grade  Diabetic? no  Interpreter Needed?:  No  Information entered by :: NAllen LPN   Activities of Daily Living In your present state of health, do you have any difficulty performing the following activities: 10/18/2020  Hearing? N  Vision? N  Difficulty concentrating or making decisions? N  Walking or climbing stairs? N  Dressing or bathing? N  Doing errands, shopping? N  Preparing Food and eating ? N  Using the Toilet? N  In the past six months, have you accidently leaked urine? Y  Comment wears a liner  Do you have problems with loss of bowel control? N  Managing your Medications? N  Managing your Finances? N  Housekeeping or managing your Housekeeping? N  Some recent data might be hidden    Patient Care Team: Malfi, Lupita Raider, FNP as PCP - General (Family Medicine) Kate Sable, MD as PCP - Cardiology (Cardiology) Malfi, Lupita Raider, FNP (Family Medicine)  Indicate any recent Medical Services you may have received from other than  [facility] providers in the past year (date may be approximate).     Assessment:   This is a routine wellness examination for Kristin Porter.  Hearing/Vision screen No exam data present  Dietary issues and exercise activities discussed: Current Exercise Habits: The patient does not participate in regular exercise at present  Goals Addressed            This Visit's Progress   . Patient Stated       10/18/2020, wants to weigh 125-135 pounds      Depression Screen PHQ 2/9 Scores 10/18/2020 06/29/2020 04/21/2020 03/22/2020 12/21/2019 11/05/2019 10/13/2019  PHQ - 2 Score 0 0 0 1 0 0 0  PHQ- 9 Score - - - 8 6 - -  Exception Documentation - - - - - Patient refusal -    Fall Risk Fall Risk  10/18/2020 10/13/2020 06/29/2020 04/21/2020 02/18/2020  Falls in the past year? 1 1 0 0 0  Comment fell in [employer] - - - -  Number falls in past yr: 0 0 - - 0  Comment - - - - -  Injury with Fall? 0 1 - - 0  Comment - Went to Urgent care - - -  Risk for fall due to : Medication side effect;Impaired mobility  Impaired balance/gait - - -  Risk for fall due to: Comment - fell on rug at [place] - - -  Follow up Falls evaluation completed;Education provided;Falls prevention discussed Falls prevention discussed - - -    FALL RISK PREVENTION PERTAINING TO THE HOME:  Any stairs in or around the home? No  If so, are there any without handrails? n/a Home free of loose throw rugs in walkways, pet beds, electrical cords, etc? Yes  Adequate lighting in your home to reduce risk of falls? Yes   ASSISTIVE DEVICES UTILIZED TO PREVENT FALLS:  Life alert? No  Use of a cane, walker or w/c? Yes  Grab bars in the bathroom? Yes  Shower chair or bench in shower? No  Elevated toilet seat or a handicapped toilet? No   TIMED UP AND GO:  Was the test performed? No .  .   Cognitive Function:     6CIT Screen 10/18/2020  What Year? 0 points  What month? 0 points  What time? 0 points  Count back from 20 0 points  Months in reverse 0 points  Repeat phrase 4 points  Total Score 4    Immunizations Immunization History  Administered Date(s) Administered  . Influenza-Unspecified 02/26/2011, 02/21/2012  . Tdap 09/04/2007, 04/24/2018    TDAP status: Up to date  Flu Vaccine status: Declined, Education has been provided regarding the importance of this vaccine but patient still declined. Advised may receive this vaccine at local pharmacy or Health Dept. Aware to provide a copy of the vaccination record if obtained from local pharmacy or Health Dept. Verbalized acceptance and understanding.  Pneumococcal vaccine status: Up to date  Covid-19 vaccine status: Declined, Education has been provided regarding the importance of this vaccine but patient still declined. Advised may receive this vaccine at local pharmacy or Health Dept.or vaccine clinic. Aware to provide a copy of the vaccination record if obtained from local pharmacy or Health Dept. Verbalized acceptance and understanding.  Qualifies for Shingles  Vaccine? Yes   Zostavax completed No   Shingrix Completed?: No.    Education has been provided regarding the importance of this vaccine. Patient has been advised to call insurance company to determine out of pocket expense  if they have not yet received this vaccine. Advised may also receive vaccine at local pharmacy or Health Dept. Verbalized acceptance and understanding.  Screening Tests Health Maintenance  Topic Date Due  . COVID-19 Vaccine (1) Never done  . Zoster Vaccines- Shingrix (1 of 2) Never done  . INFLUENZA VACCINE  12/19/2020  . MAMMOGRAM  02/03/2022  . PAP SMEAR-Modifier  09/17/2022  . TETANUS/TDAP  04/24/2028  . COLONOSCOPY (Pts 45-50yr Insurance coverage will need to be confirmed)  03/09/2029  . Hepatitis C Screening  Completed  . HIV Screening  Completed  . HPV VACCINES  Aged Out    Health Maintenance  Health Maintenance Due  Topic Date Due  . COVID-19 Vaccine (1) Never done  . Zoster Vaccines- Shingrix (1 of 2) Never done    Colorectal cancer screening: Type of screening: Colonoscopy. Completed 03/10/2019. Repeat every 10 years  Mammogram status: Completed 02/08/2020. Repeat every year  Bone Density status: 09/28/2019  Lung Cancer Screening: (Low Dose CT Chest recommended if Age 655-80years, 30 pack-year currently smoking OR have quit w/in 15years.) does not qualify.   Lung Cancer Screening Referral: no  Additional Screening:  Hepatitis C Screening: does qualify; Completed 09/17/2019  Vision Screening: Recommended annual ophthalmology exams for early detection of glaucoma and other disorders of the eye. Is the patient up to date with their annual eye exam?  Yes  Who is the provider or what is the name of the office in which the patient attends annual eye exams? Dr. NLucia GaskinsIf pt is not established with a provider, would they like to be referred to a provider to establish care? No .   Dental Screening: Recommended annual dental exams for proper oral  hygiene  Community Resource Referral / Chronic Care Management: CRR required this visit?  No   CCM required this visit?  No      Plan:     I have personally reviewed and noted the following in the patient's chart:   . Medical and social history . Use of alcohol, tobacco or illicit drugs  . Current medications and supplements including opioid prescriptions.  . Functional ability and status . Nutritional status . Physical activity . Advanced directives . List of other physicians . Hospitalizations, surgeries, and ER visits in previous 12 months . Vitals . Screenings to include cognitive, depression, and falls . Referrals and appointments  In addition, I have reviewed and discussed with patient certain preventive protocols, quality metrics, and best practice recommendations. A written personalized care plan for preventive services as well as general preventive health recommendations were provided to patient.     NKellie Simmering LPN   [id number]  Nurse Notes:

## 2020-10-20 ENCOUNTER — Encounter: Payer: Medicare Other | Admitting: Student in an Organized Health Care Education/Training Program

## 2020-10-21 LAB — TOXASSURE SELECT 13 (MW), URINE

## 2020-11-09 ENCOUNTER — Ambulatory Visit
Payer: Medicare Other | Attending: Student in an Organized Health Care Education/Training Program | Admitting: Physical Therapy

## 2020-11-09 DIAGNOSIS — G894 Chronic pain syndrome: Secondary | ICD-10-CM | POA: Insufficient documentation

## 2020-11-09 DIAGNOSIS — M545 Low back pain, unspecified: Secondary | ICD-10-CM

## 2020-11-09 DIAGNOSIS — M797 Fibromyalgia: Secondary | ICD-10-CM | POA: Insufficient documentation

## 2020-11-09 NOTE — Therapy (Signed)
[place] [facility] [address], [place], [address] Phone: [phone]   Fax:  [phone]  Physical Therapy Evaluation  Patient Details  Name: [name] MRN: [id number] Date of Birth: [birth year] No data recorded  Encounter Date: 11/09/2020   PT End of Session - 11/09/20 1346     Visit Number 1    Number of Visits 17    Date for PT Re-Evaluation 01/04/21    Progress Note Due on Visit 10    PT Start Time 1300    PT Stop Time 1343    PT Time Calculation (min) 43 min    Activity Tolerance Patient tolerated treatment well    Behavior During Therapy [facility] for tasks assessed/performed             Past Medical History:  Diagnosis Date   Arthritis    Bilateral hips   Fibromyalgia    Hypertension    Neuropathy     Past Surgical History:  Procedure Laterality Date   BREAST BIOPSY Right 2018   benign, clip was placed    There were no vitals filed for this visit.    Subjective Assessment - 11/09/20 1300     Subjective Kristin Porter is a 60 year old female who presents to therapy with c/o LBP for over a year. She denies MOI but does report falling in April 2022 in which she was seen/cleared in Urgent Care. She feels that her LBP is worsening as she feels stiffer and sorer. She reports that sitting, standing, and walking are primary aggravators of her pain. She currently takes hydrocodone, baclofen, and meloxicam as alleviators. She rates her pain today as an 8/10 and 10/10 at worst. Patient denies loss of bowel and bladder control, lack of sensation in LEs (except numbness in feet), or other medical conditions that may prevent physical therapy treatment. She does have a PMH of fibromyalgia, L TKA, peripheral neuropathy, osteopenia, HTN, and chronic pain. She would like to return to bowling and improve her ability to perform ADLs. Pt is currently amb with a SPC.    Pertinent History Kristin Porter is  a 60 year old female who presents to therapy with c/o LBP for over a year. She denies MOI but does report falling in April 2022 in which she was seen/cleared in Urgent Care. She feels that her LBP is worsening as she feels stiffer and sorer. She reports that sitting, standing, and walking are primary aggravators of her pain. She currently takes hydrocodone, baclofen, and meloxicam as alleviators. She rates her pain today as an 8/10 and 10/10 at worst. Patient denies loss of bowel and bladder control, lack of sensation in LEs (except numbness in feet), or other medical conditions that may prevent physical therapy treatment. She does have a PMH of fibromyalgia, L TKA, peripheral neuropathy, osteopenia, HTN, and chronic pain. She would like to return to bowling and improving ability to perform ADL. Pt currently amb with a SPC.    Limitations Lifting;Standing;Walking;House hold activities    How long can you sit comfortably? 45 min    How long can you stand comfortably? 5-7    How long can you walk comfortably? 83f    Diagnostic tests MRI    Patient Stated Goals Being able to perform household activities    Currently in Pain? Yes    Pain Score 8     Pain Location Back    Pain Orientation Lower  Pain Descriptors / Indicators Aching;Burning;Constant;Dull;Sharp    Pain Type Chronic pain    Pain Radiating Towards n/a    Pain Onset More than a month ago    Pain Frequency Constant    Aggravating Factors  Bending, liftin, twisting, standing, walking    Pain Relieving Factors medication and sitting    Effect of Pain on Daily Activities limits household activities    Multiple Pain Sites No               Objective:   Lumbar AROM Flex: 75 deg Ext: 30 deg Lateral flexion L & R  WFL Rotation L&R: WFL  *no pain with overpressure with all motions  PAIVM: P-A Grade 2 mobilizations L1-L5 decreased patients concordant pain. Patient positioned prone.   Thoracic Spine Rotation: WFL  *no pain  with overpressure  Hip AROM/PROM: WFL (noted decreased bilateral hip extension) improved with MET  FADIR/FADER (-)  Hip Strength MMT 4/5 in all planes except IR/ER   Observations:  Posture: Forward head, rounded shoulders, increased lumbar lordosis  Gait: decreased step length, increased bilat hip ER throughout cycle, L Trendelenburg, decreased arm swing L, AMB with SPC in R UE  Palpation: point tenderness to R QL  Test & Measures  5XSTS: 22 sec 10 MWT: 0.64ms (self selected speed) Single Leg Stance: <10 sec bilaterally Plank: deferred  Therapeutic Exercise  Sit to Stands: 3 x 10 reps PT reviewed the following HEP with patient with patient able to demonstrate a set of the following with min cuing for correction needed. PT educated patient on parameters of therex (how/when to inc/decrease intensity, frequency, rep/set range, stretch hold time, and purpose of therex) with verbalized understanding        Objective measurements completed on examination: See above findings.               PT Education - 11/09/20 1457     Education Details Pt educated on HEP and POC    Person(s) Educated Patient    Methods Explanation;Demonstration    Comprehension Verbalized understanding;Returned demonstration              PT Short Term Goals - 11/09/20 1650       PT SHORT TERM GOAL #1   Title Pt will be independent with HEP in order to self manage condition.    Baseline No prior HEP    Time 4    Period Weeks    Status New    Target Date 01/04/21               PT Long Term Goals - 11/09/20 1651       PT LONG TERM GOAL #1   Title Pt report decrease in LBP no greater than 5/10 in order to demonstrate clinically significance and improve QOL.    Baseline 11/09/2020: 8/10    Time 8    Period Weeks    Status New    Target Date 01/04/21      PT LONG TERM GOAL #2   Title Pt will perform 5XSTS within 12 seconds in order to demostrate improved LE strength  and decrease the likelihood of falling.    Baseline 11/09/2020: 22 sec    Time 8    Period Weeks    Status New    Target Date 01/04/21      PT LONG TERM GOAL #3   Title Pt will improve FOTO score to 45 in order to demonstrate improvement of functional mobility and quality  of life.    Baseline 11/09/2020: 30    Time 8    Period Weeks    Status New    Target Date 01/04/21      PT LONG TERM GOAL #4   Title Pt will improve single leg stance to 20 sec or greater in order to demonstrate increased LE strength, increased in balance, and decrease likelihood of falling.    Baseline 11/09/2020: <10 seconds    Time 8    Period Weeks    Status New    Target Date 01/04/21      PT LONG TERM GOAL #5   Title Pt will increase gait speed in 10MWT to >0.5ms to demonstrate appropriate speed for limited community ambulation    Baseline 11/10/20 0.776m    Time 8    Period Weeks    Status New                    Plan - 11/09/20 1627     Clinical Impression Statement Jamesa HoMarinellos a 89.o.female who presents with primary c/o chronic LBP. Pt demonstrates decreased LE strength, decreased core stability, decreased activity tolerance, balance deficits, and pain. Activity limitations in lifting, cleaining, walking, and standing limits participation in activities of daily living and community ambulation. Pt will benefit from skilled PT in order to improve pain, functional mobilty, and decrease risk of falls.    Personal Factors and Comorbidities Comorbidity 3+    Comorbidities chronic pain, fibromyalgia    Examination-Activity Limitations Lift;Bend;Locomotion Level;Stand;Stairs;Sleep;Sit    Examination-Participation Restrictions Cleaning;Laundry;Other;Community Activity    Stability/Clinical Decision Making Evolving/Moderate complexity    Clinical Decision Making Moderate    Rehab Potential Fair    PT Frequency 2x / week    PT Duration 8 weeks    PT Treatment/Interventions ADLs/Self Care  Home Management;Cryotherapy;Electrical Stimulation;Moist Heat;Traction;Gait training;Stair training;Functional mobility training;Therapeutic activities;Therapeutic exercise;Balance training;Neuromuscular re-education;Patient/family education;Manual techniques;Passive range of motion;Dry needling;Taping;Spinal Manipulations;Joint Manipulations;Ultrasound;Iontophoresis 10m210ml Dexamethasone    PT Next Visit Plan Initiate stretching, core and LE strengthening (record plank time & set goal)    PT Home Exercise Plan Sit to stands    Consulted and Agree with Plan of Care Patient             Patient will benefit from skilled therapeutic intervention in order to improve the following deficits and impairments:  Abnormal gait, Decreased balance, Decreased endurance, Decreased mobility, Difficulty walking, Hypomobility, Impaired sensation, Improper body mechanics, Decreased range of motion, Obesity, Pain, Decreased activity tolerance, Decreased strength, Impaired flexibility  Visit Diagnosis: Chronic low back pain without sciatica, unspecified back pain laterality     Problem List Patient Active Problem List   Diagnosis Date Noted   Night sweats 03/22/2020   Carpal tunnel syndrome 12/21/2019   Seasonal allergies 12/21/2019   Osteopenia 09/28/2019   Routine medical exam 09/17/2019   Chronic pain syndrome 09/09/2019   Insomnia 08/20/2019   Encounter to establish care with new doctor 08/20/2019   BMI 40.0-44.9, adult ([facility]/16/2021   Other bilateral secondary osteoarthritis of hip 05/20/2019   Chronic pain of both hips 04/16/2018   Prediabetes 01/23/2018   Hot flashes 01/23/2018   Thoracic myelopathy 12/26/2017   Syrinx of spinal cord ([place]/21/2018   Lumbosacral spondylosis without myelopathy 09/05/2016   History of total knee arthroplasty, left 08/16/2014   Numbness in feet 05/17/2014   Multiple joint pain 12/09/2013   Hypertension 02/13/2013   OA (osteoarthritis) of knee 07/30/2012    GERD (gastroesophageal reflux  disease) 06/19/2012   Gait instability 02/24/2012   Low back pain 02/18/2012   Depression 10/30/2011   Hyperlipidemia 02/17/2011   Fibromyalgia 12/01/2010   Dysesthesia 12/01/2010    Durwin Reges DPT Sharion Settler, SPT  Durwin Reges 11/10/2020, 11:00 AM  [place] [facility] [address] [place], [address] Phone: [phone]   Fax:  [phone]  Name: LATANGA NEDROW MRN: [id number] Date of Birth: [birth year]

## 2020-11-10 ENCOUNTER — Encounter: Payer: Self-pay | Admitting: Physical Therapy

## 2020-11-16 ENCOUNTER — Other Ambulatory Visit: Payer: Self-pay

## 2020-11-16 ENCOUNTER — Ambulatory Visit: Payer: Medicare Other | Admitting: Physical Therapy

## 2020-11-16 DIAGNOSIS — M797 Fibromyalgia: Secondary | ICD-10-CM | POA: Diagnosis not present

## 2020-11-16 DIAGNOSIS — M545 Low back pain, unspecified: Secondary | ICD-10-CM

## 2020-11-16 NOTE — Therapy (Signed)
[place] [facility] [address] [place], [address] Phone: [phone]   Fax:  [phone]  Physical Therapy Treatment  Patient Details  Name: Kristin Porter MRN: [id number] Date of Birth: August 05, 1960 No data recorded  Encounter Date: 11/16/2020   PT End of Session - 11/16/20 1610     Visit Number 2    Number of Visits 17    Date for PT Re-Evaluation 01/04/21    Authorization - Visit Number 2    Progress Note Due on Visit 10    PT Start Time 1604    PT Stop Time 1645    PT Time Calculation (min) 41 min    Activity Tolerance Patient tolerated treatment well    Behavior During Therapy [facility] for tasks assessed/performed             Past Medical History:  Diagnosis Date   Arthritis    Bilateral hips   Fibromyalgia    Hypertension    Neuropathy     Past Surgical History:  Procedure Laterality Date   BREAST BIOPSY Right 2018   benign, clip was placed    There were no vitals filed for this visit.   Subjective Assessment - 11/16/20 1604     Subjective Pt reports feeling tired today with pain 6/10 on NPRS currently. She reports being active in HEP and states she feels that she can handle more exercises.    Pertinent History Kristin Porter is a 60 year old female who presents to therapy with c/o LBP for over a year. She denies MOI but does report falling in April 2022 in which she was seen/cleared in Urgent Care. She feels that her LBP is worsening as she feels stiffer and sorer. She reports that sitting, standing, and walking are primary aggravators of her pain. She currently takes hydrocodone, baclofen, and meloxicam as alleviators. She rates her pain today as an 8/10 and 10/10 at worst. Patient denies loss of bowel and bladder control, lack of sensation in LEs (except numbness in feet), or other medical conditions that may prevent physical therapy treatment. She does have a PMH of fibromyalgia, L TKA,  peripheral neuropathy, osteopenia, HTN, and chronic pain. She would like to return to bowling and improving ability to perform ADL. Pt currently amb with a SPC.    Limitations Lifting;Standing;Walking;House hold activities    How long can you sit comfortably? 45 min    How long can you stand comfortably? 5-7    How long can you walk comfortably? 57ft    Diagnostic tests MRI    Patient Stated Goals Being able to perform household activities               Therex  Nu Step level 2 x 5 min seat 7 LE only  TRX squat 2 x 10 with cues to correct feet distance and depth  Open books L side lying > R 3 x 20 sec  DKTC 1 x 20 sec  Seated forward flexion on exercise ball emphasis on repetitive motions 1 x 60 sec  Modified bird dog 2 x 5 reps with 3 sec hold  *cues to hold and   Monster walks 1 x 15 ft R<>L with hand held assist to maintain balance    Ther-Ex PT reviewed the following HEP with patient with patient able to demonstrate a set of the following with min cuing for correction needed. PT educated patient on parameters of therex (how/when to  inc/decrease intensity, frequency, rep/set range, stretch hold time, and purpose of therex) with verbalized understanding.                    PT Education - 11/17/20 0807     Education Details therex form/technique    Person(s) Educated Patient    Methods Explanation;Demonstration;Verbal cues    Comprehension Verbalized understanding;Returned demonstration;Verbal cues required              PT Short Term Goals - 11/09/20 1650       PT SHORT TERM GOAL #1   Title Pt will be independent with HEP in order to self manage condition.    Baseline No prior HEP    Time 4    Period Weeks    Status New    Target Date 01/04/21               PT Long Term Goals - 11/09/20 1651       PT LONG TERM GOAL #1   Title Pt report decrease in LBP no greater than 5/10 in order to demonstrate clinically significance and  improve QOL.    Baseline 11/09/2020: 8/10    Time 8    Period Weeks    Status New    Target Date 01/04/21      PT LONG TERM GOAL #2   Title Pt will perform 5XSTS within 12 seconds in order to demostrate improved LE strength and decrease the likelihood of falling.    Baseline 11/09/2020: 22 sec    Time 8    Period Weeks    Status New    Target Date 01/04/21      PT LONG TERM GOAL #3   Title Pt will improve FOTO score to 45 in order to demonstrate improvement of functional mobility and quality of life.    Baseline 11/09/2020: 30    Time 8    Period Weeks    Status New    Target Date 01/04/21      PT LONG TERM GOAL #4   Title Pt will improve single leg stance to 20 sec or greater in order to demonstrate increased LE strength, increased in balance, and decrease likelihood of falling.    Baseline 11/09/2020: <10 seconds    Time 8    Period Weeks    Status New    Target Date 01/04/21      PT LONG TERM GOAL #5   Title Pt will increase gait speed in 10MWT to >0.72m/s to demonstrate appropriate speed for limited community ambulation    Baseline 11/10/20 0.35m/s    Time 8    Period Weeks    Status New                   Plan - 11/16/20 1653     Clinical Impression Statement Pt tolerated session well with reports of decrease in LBP at end of session. Pt back pain responded positively to repetitive motion (flexion), however patient demonstrated increased need for rest breaks. Pt will continue to benefit from skilled physical therapy to address impairments and achieve set goals.    Personal Factors and Comorbidities Comorbidity 3+    Comorbidities chronic pain, fibromyalgia    Examination-Activity Limitations Lift;Bend;Locomotion Level;Stand;Stairs;Sleep;Sit    Examination-Participation Restrictions Cleaning;Laundry;Other;Community Activity    PT Treatment/Interventions ADLs/Self Care Home Management;Cryotherapy;Electrical Stimulation;Moist Heat;Traction;Gait training;Stair  training;Functional mobility training;Therapeutic activities;Therapeutic exercise;Balance training;Neuromuscular re-education;Patient/family education;Manual techniques;Passive range of motion;Dry needling;Taping;Spinal Manipulations;Joint Manipulations;Ultrasound;Iontophoresis 4mg /ml Dexamethasone  PT Next Visit Plan Initiate stretching, core and LE strengthening (record plank time & set goal)    PT Home Exercise Plan Sit to stands, monster walks, modified planks, repeated flexion             Patient will benefit from skilled therapeutic intervention in order to improve the following deficits and impairments:  Abnormal gait, Decreased balance, Decreased endurance, Decreased mobility, Difficulty walking, Hypomobility, Impaired sensation, Improper body mechanics, Decreased range of motion, Obesity, Pain, Decreased activity tolerance, Decreased strength, Impaired flexibility  Visit Diagnosis: Chronic low back pain without sciatica, unspecified back pain laterality     Problem List Patient Active Problem List   Diagnosis Date Noted   Night sweats 03/22/2020   Carpal tunnel syndrome 12/21/2019   Seasonal allergies 12/21/2019   Osteopenia 09/28/2019   Routine medical exam 09/17/2019   Chronic pain syndrome 09/09/2019   Insomnia 08/20/2019   Encounter to establish care with new doctor 08/20/2019   BMI 40.0-44.9, adult ([place]) 07/07/2019   Other bilateral secondary osteoarthritis of hip 05/20/2019   Chronic pain of both hips 04/16/2018   Prediabetes 01/23/2018   Hot flashes 01/23/2018   Thoracic myelopathy 12/26/2017   Syrinx of spinal cord ([place]) 05/10/2017   Lumbosacral spondylosis without myelopathy 09/05/2016   History of total knee arthroplasty, left 08/16/2014   Numbness in feet 05/17/2014   Multiple joint pain 12/09/2013   Hypertension 02/13/2013   OA (osteoarthritis) of knee 07/30/2012   GERD (gastroesophageal reflux disease) 06/19/2012   Gait instability 02/24/2012   Low  back pain 02/18/2012   Depression 10/30/2011   Hyperlipidemia 02/17/2011   Fibromyalgia 12/01/2010   Dysesthesia 12/01/2010    Durwin Reges DPT Sharion Settler, SPT  Durwin Reges 11/17/2020, 8:09 AM  [facility] [address] [place], [address] Phone: [phone]   Fax:  [phone]  Name: Kristin Porter MRN: [id number] Date of Birth: 1960-12-08

## 2020-11-17 ENCOUNTER — Encounter: Payer: Self-pay | Admitting: Physical Therapy

## 2020-11-23 ENCOUNTER — Encounter: Payer: Self-pay | Admitting: Physical Therapy

## 2020-11-23 ENCOUNTER — Ambulatory Visit
Payer: Medicare Other | Attending: Student in an Organized Health Care Education/Training Program | Admitting: Physical Therapy

## 2020-11-23 DIAGNOSIS — R269 Unspecified abnormalities of gait and mobility: Secondary | ICD-10-CM | POA: Diagnosis not present

## 2020-11-23 DIAGNOSIS — M797 Fibromyalgia: Secondary | ICD-10-CM | POA: Insufficient documentation

## 2020-11-23 DIAGNOSIS — M545 Low back pain, unspecified: Secondary | ICD-10-CM

## 2020-11-23 DIAGNOSIS — G8929 Other chronic pain: Secondary | ICD-10-CM

## 2020-11-23 DIAGNOSIS — G894 Chronic pain syndrome: Secondary | ICD-10-CM | POA: Diagnosis not present

## 2020-11-23 NOTE — Therapy (Signed)
[facility] [address], [place], [address] Phone: [phone]   Fax:  [phone]  Physical Therapy Treatment  Patient Details  Name: [name] MRN: [id number] Date of Birth: [birth year] No data recorded  Encounter Date: 11/23/2020   PT End of Session - 11/24/20 0826     Visit Number 3    Number of Visits 17    Date for PT Re-Evaluation 01/04/21    Authorization - Visit Number 3    Progress Note Due on Visit 10    PT Start Time 1350    PT Stop Time 1430    PT Time Calculation (min) 40 min    Activity Tolerance Patient tolerated treatment well    Behavior During Therapy [facility] for tasks assessed/performed             Past Medical History:  Diagnosis Date   Arthritis    Bilateral hips   Fibromyalgia    Hypertension    Neuropathy     Past Surgical History:  Procedure Laterality Date   BREAST BIOPSY Right 2018   benign, clip was placed    There were no vitals filed for this visit.   Subjective Assessment - 11/23/20 1352     Subjective Pt reports feeling well today with pain 5/10 on NPRS currently, She reports HEP and flexion based exercises are helping reduce her stiffness and pain especially in the morning.    Pertinent History [name] is a 60 year old female who presents to therapy with c/o LBP for over a year. She denies MOI but does report falling in April 2022 in which she was seen/cleared in Urgent Care. She feels that her LBP is worsening as she feels stiffer and sorer. She reports that sitting, standing, and walking are primary aggravators of her pain. She currently takes hydrocodone, baclofen, and meloxicam as alleviators. She rates her pain today as an 8/10 and 10/10 at worst. Patient denies loss of bowel and bladder control, lack of sensation in LEs (except numbness in feet), or other medical conditions that may prevent physical therapy treatment. She does have a PMH of  fibromyalgia, L TKA, peripheral neuropathy, osteopenia, HTN, and chronic pain. She would like to return to bowling and improving ability to perform ADL. Pt currently amb with a SPC.    Limitations Lifting;Standing;Walking;House hold activities    Patient Stated Goals Being able to perform household activities             Therapeutic Exercise  Nu Step level 2 x 5 min seat 7 LE only   TRX squat 2 x 10 with cues to correct feet distance and depth   Leg press 1 x 10reps  55#, 1 x 10 reps 95#, 1 x 10 reps #115 (4 th whole visible on seat)  Modified bird dog 2 x 5 reps with 3 sec hold  *cues to hold and tighten core   Monster walks distance: halfway around dug out  R<>L with hand held assist to maintain balance or on dug out wall  Lateral step downs 4" step 1 x 10 each side cues to use UE less  6" straight leg hold in supine 1 x 30sec    Plank test 1 x 6 sec stomach did not totally clear mat table                           PT Education -  11/23/20 1614     Education Details therex form/technique    Person(s) Educated Patient    Methods Explanation;Demonstration    Comprehension Verbalized understanding;Returned demonstration              PT Short Term Goals - 11/09/20 1650       PT SHORT TERM GOAL #1   Title Pt will be independent with HEP in order to self manage condition.    Baseline No prior HEP    Time 4    Period Weeks    Status New    Target Date 01/04/21               PT Long Term Goals - 11/09/20 1651       PT LONG TERM GOAL #1   Title Pt report decrease in LBP no greater than 5/10 in order to demonstrate clinically significance and improve QOL.    Baseline 11/09/2020: 8/10    Time 8    Period Weeks    Status New    Target Date 01/04/21      PT LONG TERM GOAL #2   Title Pt will perform 5XSTS within 12 seconds in order to demostrate improved LE strength and decrease the likelihood of falling.    Baseline 11/09/2020: 22 sec     Time 8    Period Weeks    Status New    Target Date 01/04/21      PT LONG TERM GOAL #3   Title Pt will improve FOTO score to 45 in order to demonstrate improvement of functional mobility and quality of life.    Baseline 11/09/2020: 30    Time 8    Period Weeks    Status New    Target Date 01/04/21      PT LONG TERM GOAL #4   Title Pt will improve single leg stance to 20 sec or greater in order to demonstrate increased LE strength, increased in balance, and decrease likelihood of falling.    Baseline 11/09/2020: <10 seconds    Time 8    Period Weeks    Status New    Target Date 01/04/21      PT LONG TERM GOAL #5   Title Pt will increase gait speed in 10MWT to >0.55m/s to demonstrate appropriate speed for limited community ambulation    Baseline 11/10/20 0.70m/s    Time 8    Period Weeks    Status New                   Plan - 11/23/20 1403     Clinical Impression Statement Pt tolerated session well evidenced by reported decrease in LBP from 5/10 to 3/10 following therapeutic exercise. Pt continues to display decreased LE and cores weakness and decreased activity tolerance. Pt needed multimodal cues to ensure proper form and safe exercise progression. Pt will continue to benefit from skilled therapy    Personal Factors and Comorbidities Comorbidity 3+    Comorbidities chronic pain, fibromyalgia    Examination-Activity Limitations Lift;Bend;Locomotion Level;Stand;Stairs;Sleep;Sit    Examination-Participation Restrictions Cleaning;Laundry;Other;Community Activity    PT Treatment/Interventions ADLs/Self Care Home Management;Cryotherapy;Electrical Stimulation;Moist Heat;Traction;Gait training;Stair training;Functional mobility training;Therapeutic activities;Therapeutic exercise;Balance training;Neuromuscular re-education;Patient/family education;Manual techniques;Passive range of motion;Dry needling;Taping;Spinal Manipulations;Joint Manipulations;Ultrasound;Iontophoresis  4mg /ml Dexamethasone    PT Next Visit Plan Initiate stretching, core and LE strengthening (record plank time & set goal)    PT Home Exercise Plan Sit to stands, monster walks, modified planks, repeated flexion  Patient will benefit from skilled therapeutic intervention in order to improve the following deficits and impairments:  Abnormal gait, Decreased balance, Decreased endurance, Decreased mobility, Difficulty walking, Hypomobility, Impaired sensation, Improper body mechanics, Decreased range of motion, Obesity, Pain, Decreased activity tolerance, Decreased strength, Impaired flexibility  Visit Diagnosis: Chronic low back pain without sciatica, unspecified back pain laterality     Problem List Patient Active Problem List   Diagnosis Date Noted   Night sweats 03/22/2020   Carpal tunnel syndrome 12/21/2019   Seasonal allergies 12/21/2019   Osteopenia 09/28/2019   Routine medical exam 09/17/2019   Chronic pain syndrome 09/09/2019   Insomnia 08/20/2019   Encounter to establish care with new doctor 08/20/2019   BMI 40.0-44.9, adult ([place]) 07/07/2019   Other bilateral secondary osteoarthritis of hip 05/20/2019   Chronic pain of both hips 04/16/2018   Prediabetes 01/23/2018   Hot flashes 01/23/2018   Thoracic myelopathy 12/26/2017   Syrinx of spinal cord ([place]) 05/10/2017   Lumbosacral spondylosis without myelopathy 09/05/2016   History of total knee arthroplasty, left 08/16/2014   Numbness in feet 05/17/2014   Multiple joint pain 12/09/2013   Hypertension 02/13/2013   OA (osteoarthritis) of knee 07/30/2012   GERD (gastroesophageal reflux disease) 06/19/2012   Gait instability 02/24/2012   Low back pain 02/18/2012   Depression 10/30/2011   Hyperlipidemia 02/17/2011   Fibromyalgia 12/01/2010   Dysesthesia 12/01/2010    Durwin Reges DPT Sharion Settler, SPT  Durwin Reges 11/24/2020, 8:27 AM  [facility] [address], [place], [address] Phone: [phone]   Fax:  [phone]  Name: SHANDIE BERTZ MRN: [id number] Date of Birth: 08/31/1960

## 2020-11-25 ENCOUNTER — Ambulatory Visit: Payer: Medicare Other | Admitting: Physical Therapy

## 2020-11-29 ENCOUNTER — Ambulatory Visit: Payer: Medicare Other | Admitting: Physical Therapy

## 2020-11-29 DIAGNOSIS — M797 Fibromyalgia: Secondary | ICD-10-CM | POA: Diagnosis not present

## 2020-11-29 DIAGNOSIS — M545 Low back pain, unspecified: Secondary | ICD-10-CM

## 2020-11-29 DIAGNOSIS — G8929 Other chronic pain: Secondary | ICD-10-CM

## 2020-11-29 NOTE — Therapy (Signed)
[facility] [address] [place], [address] Phone: [phone]   Fax:  [phone]  Physical Therapy Treatment  Patient Details  Name: Kristin Porter MRN: [id number] Date of Birth: 1960-12-19 No data recorded  Encounter Date: 11/29/2020   PT End of Session - 11/29/20 1538     Visit Number 4    Number of Visits 17    Date for PT Re-Evaluation 01/04/21    Authorization - Visit Number 4    Progress Note Due on Visit 10    PT Start Time 0277    PT Stop Time 1555    PT Time Calculation (min) 38 min    Activity Tolerance Patient tolerated treatment well    Behavior During Therapy [facility] for tasks assessed/performed             Past Medical History:  Diagnosis Date   Arthritis    Bilateral hips   Fibromyalgia    Hypertension    Neuropathy     Past Surgical History:  Procedure Laterality Date   BREAST BIOPSY Right 2018   benign, clip was placed    There were no vitals filed for this visit.   Subjective Assessment - 11/29/20 1520     Subjective Pt reports feeling very fatigued due fibromyalgia flare up. She reports she has had mulitple muscle spasm in her back, legs, arms, and chest. She reports her LBP is a 6/10 on NPRS today. She does report that she feels she can walk and stand for a longer duration.    Pertinent History Kristin Porter is a 60 year old female who presents to therapy with c/o LBP for over a year. She denies MOI but does report falling in April 2022 in which she was seen/cleared in Urgent Care. She feels that her LBP is worsening as she feels stiffer and sorer. She reports that sitting, standing, and walking are primary aggravators of her pain. She currently takes hydrocodone, baclofen, and meloxicam as alleviators. She rates her pain today as an 8/10 and 10/10 at worst. Patient denies loss of bowel and bladder control, lack of sensation in LEs (except numbness in feet), or other  medical conditions that may prevent physical therapy treatment. She does have a PMH of fibromyalgia, L TKA, peripheral neuropathy, osteopenia, HTN, and chronic pain. She would like to return to bowling and improving ability to perform ADL. Pt currently amb with a SPC.    Limitations Lifting;Standing;Walking;House hold activities    How long can you sit comfortably? 45 min    How long can you stand comfortably? 5-7    How long can you walk comfortably? 59ft    Diagnostic tests MRI    Patient Stated Goals Being able to perform household activities            Therapeutic Exercise  Nu Step level 3 x 5 min seat 7 LE only  Total Gym Squats 3 x 10 reps level 26  Mini lunges bilat LE 2 x 12 reps UE support on treadmill cues to bend front knee and drop back knee.   Dead Bug with exercise ball 1 x 10 reps with feet on mat table cues to perform with opposite arm opposite leg and hold 3 sec   Glute bridges 1 x 10 with cues to hold and contract buttocks and core for 2-3 sec  Roll outs on exercise ball 1 x 12 reps (seated lumbar flexion)  PT Education - 11/29/20 1540     Education Details Pt educated on therex form/technique and communicating with therapist about fatigue.    Person(s) Educated Patient    Methods Explanation;Demonstration    Comprehension Verbalized understanding;Returned demonstration              PT Short Term Goals - 11/09/20 1650       PT SHORT TERM GOAL #1   Title Pt will be independent with HEP in order to self manage condition.    Baseline No prior HEP    Time 4    Period Weeks    Status New    Target Date 01/04/21               PT Long Term Goals - 11/09/20 1651       PT LONG TERM GOAL #1   Title Pt report decrease in LBP no greater than 5/10 in order to demonstrate clinically significance and improve QOL.    Baseline 11/09/2020: 8/10    Time 8    Period Weeks    Status New    Target Date 01/04/21      PT LONG TERM GOAL #2    Title Pt will perform 5XSTS within 12 seconds in order to demostrate improved LE strength and decrease the likelihood of falling.    Baseline 11/09/2020: 22 sec    Time 8    Period Weeks    Status New    Target Date 01/04/21      PT LONG TERM GOAL #3   Title Pt will improve FOTO score to 45 in order to demonstrate improvement of functional mobility and quality of life.    Baseline 11/09/2020: 30    Time 8    Period Weeks    Status New    Target Date 01/04/21      PT LONG TERM GOAL #4   Title Pt will improve single leg stance to 20 sec or greater in order to demonstrate increased LE strength, increased in balance, and decrease likelihood of falling.    Baseline 11/09/2020: <10 seconds    Time 8    Period Weeks    Status New    Target Date 01/04/21      PT LONG TERM GOAL #5   Title Pt will increase gait speed in 10MWT to >0.20m/s to demonstrate appropriate speed for limited community ambulation    Baseline 11/10/20 0.26m/s    Time 8    Period Weeks    Status New                   Plan - 11/29/20 1557     Clinical Impression Statement Pt tolerated session well evidenced by decrease in LBP from 6/10 to 4/10 following therapeutic exercise. Pt continues to display decreased LE strength, decreased core strength, and decreased activity tolerance. PT decreased exercise intensity this session to accomodate for patient fatigue. PT utilized multimodal cueing to ensure proper form/technique. Continue PT POC as able.    Personal Factors and Comorbidities Comorbidity 3+    Comorbidities chronic pain, fibromyalgia    Examination-Activity Limitations Lift;Bend;Locomotion Level;Stand;Stairs;Sleep;Sit    Examination-Participation Restrictions Cleaning;Laundry;Other;Community Activity    PT Treatment/Interventions ADLs/Self Care Home Management;Cryotherapy;Electrical Stimulation;Moist Heat;Traction;Gait training;Stair training;Functional mobility training;Therapeutic activities;Therapeutic  exercise;Balance training;Neuromuscular re-education;Patient/family education;Manual techniques;Passive range of motion;Dry needling;Taping;Spinal Manipulations;Joint Manipulations;Ultrasound;Iontophoresis 4mg /ml Dexamethasone    PT Next Visit Plan Initiate stretching, core and LE strengthening (record plank time & set goal)    PT Home  Exercise Plan Sit to stands, monster walks, modified planks, repeated flexion             Patient will benefit from skilled therapeutic intervention in order to improve the following deficits and impairments:  Abnormal gait, Decreased balance, Decreased endurance, Decreased mobility, Difficulty walking, Hypomobility, Impaired sensation, Improper body mechanics, Decreased range of motion, Obesity, Pain, Decreased activity tolerance, Decreased strength, Impaired flexibility  Visit Diagnosis: Chronic low back pain without sciatica, unspecified back pain laterality     Problem List Patient Active Problem List   Diagnosis Date Noted   Night sweats 03/22/2020   Carpal tunnel syndrome 12/21/2019   Seasonal allergies 12/21/2019   Osteopenia 09/28/2019   Routine medical exam 09/17/2019   Chronic pain syndrome 09/09/2019   Insomnia 08/20/2019   Encounter to establish care with new doctor 08/20/2019   BMI 40.0-44.9, adult ([place]) 07/07/2019   Other bilateral secondary osteoarthritis of hip 05/20/2019   Chronic pain of both hips 04/16/2018   Prediabetes 01/23/2018   Hot flashes 01/23/2018   Thoracic myelopathy 12/26/2017   Syrinx of spinal cord ([place]) 05/10/2017   Lumbosacral spondylosis without myelopathy 09/05/2016   History of total knee arthroplasty, left 08/16/2014   Numbness in feet 05/17/2014   Multiple joint pain 12/09/2013   Hypertension 02/13/2013   OA (osteoarthritis) of knee 07/30/2012   GERD (gastroesophageal reflux disease) 06/19/2012   Gait instability 02/24/2012   Low back pain 02/18/2012   Depression 10/30/2011   Hyperlipidemia  02/17/2011   Fibromyalgia 12/01/2010   Dysesthesia 12/01/2010     Durwin Reges DPT Sharion Settler, SPT  Durwin Reges 11/30/2020, 9:12 AM  [facility] [address] [place], [address] Phone: [phone]   Fax:  [phone]  Name: Kristin Porter MRN: [id number] Date of Birth: 1961/02/13

## 2020-12-01 ENCOUNTER — Ambulatory Visit: Payer: Medicare Other | Admitting: Physical Therapy

## 2020-12-01 ENCOUNTER — Encounter: Payer: Self-pay | Admitting: Physical Therapy

## 2020-12-01 DIAGNOSIS — G8929 Other chronic pain: Secondary | ICD-10-CM

## 2020-12-01 DIAGNOSIS — M545 Low back pain, unspecified: Secondary | ICD-10-CM

## 2020-12-01 DIAGNOSIS — M797 Fibromyalgia: Secondary | ICD-10-CM | POA: Diagnosis not present

## 2020-12-01 NOTE — Therapy (Signed)
[facility] [address] [place], [address] Phone: [phone]   Fax:  [phone]  Physical Therapy Treatment  Patient Details  Name: Kristin Porter MRN: [id number] Date of Birth: 1960/06/27 No data recorded  Encounter Date: 12/01/2020   PT End of Session - 12/01/20 1028     Visit Number 5    Number of Visits 17    Date for PT Re-Evaluation 01/04/21    Authorization - Visit Number 5    Progress Note Due on Visit 10    PT Start Time 1028    PT Stop Time 1108    PT Time Calculation (min) 40 min    Activity Tolerance Patient tolerated treatment well    Behavior During Therapy [facility] for tasks assessed/performed             Past Medical History:  Diagnosis Date   Arthritis    Bilateral hips   Fibromyalgia    Hypertension    Neuropathy     Past Surgical History:  Procedure Laterality Date   BREAST BIOPSY Right 2018   benign, clip was placed    There were no vitals filed for this visit.   Subjective Assessment - 12/01/20 1033     Subjective Pt continues to feel very fatigued due fibromyalgia flare up. She reports she has had mulitple muscle spasm in her back, legs, arms, and chest. She reports her LBP is a 5/10 on NPRS today. She does report that she feels that her back pain doesn't last as long as it used to.    Pertinent History Kristin Porter is a 60 year old female who presents to therapy with c/o LBP for over a year. She denies MOI but does report falling in April 2022 in which she was seen/cleared in Urgent Care. She feels that her LBP is worsening as she feels stiffer and sorer. She reports that sitting, standing, and walking are primary aggravators of her pain. She currently takes hydrocodone, baclofen, and meloxicam as alleviators. She rates her pain today as an 8/10 and 10/10 at worst. Patient denies loss of bowel and bladder control, lack of sensation in LEs (except numbness in  feet), or other medical conditions that may prevent physical therapy treatment. She does have a PMH of fibromyalgia, L TKA, peripheral neuropathy, osteopenia, HTN, and chronic pain. She would like to return to bowling and improving ability to perform ADL. Pt currently amb with a SPC.    Limitations Lifting;Standing;Walking;House hold activities    How long can you sit comfortably? 45 min    How long can you stand comfortably? 5-7    How long can you walk comfortably? 81ft    Diagnostic tests MRI    Patient Stated Goals Being able to perform household activities            Therex  Nu Step level 3 x 5 min seat 7 LE only for LE strengthening   TRX squat 2 x 10 with cues to correct feet distance and depth  Trx Lateral lunges 2 x 8 each side cues to shift wt to one leg and push back up and alternate    Leg press 1 x 12reps  35#, 1 x 10 reps 35#, single LE at a time (4 th whole visible on seat)   Dead Bug with exercise ball 1 x 10 reps with feet on mat table cues to perform with opposite arm opposite leg and hold 3  sec   Glute bridges 1 x 10 with cues to hold and contract buttocks and core for 2-3 sec  Red exercise ball roll outs 1 x 20 reps (repetitive motions) 2 sec hold working on breathing in through nose out through mouth.                        PT Education - 12/01/20 1108     Education Details Pt educated on therex form/technique    Person(s) Educated Patient    Methods Explanation;Demonstration    Comprehension Verbalized understanding;Returned demonstration              PT Short Term Goals - 11/09/20 1650       PT SHORT TERM GOAL #1   Title Pt will be independent with HEP in order to self manage condition.    Baseline No prior HEP    Time 4    Period Weeks    Status New    Target Date 01/04/21               PT Long Term Goals - 11/09/20 1651       PT LONG TERM GOAL #1   Title Pt report decrease in LBP no greater than 5/10 in  order to demonstrate clinically significance and improve QOL.    Baseline 11/09/2020: 8/10    Time 8    Period Weeks    Status New    Target Date 01/04/21      PT LONG TERM GOAL #2   Title Pt will perform 5XSTS within 12 seconds in order to demostrate improved LE strength and decrease the likelihood of falling.    Baseline 11/09/2020: 22 sec    Time 8    Period Weeks    Status New    Target Date 01/04/21      PT LONG TERM GOAL #3   Title Pt will improve FOTO score to 45 in order to demonstrate improvement of functional mobility and quality of life.    Baseline 11/09/2020: 30    Time 8    Period Weeks    Status New    Target Date 01/04/21      PT LONG TERM GOAL #4   Title Pt will improve single leg stance to 20 sec or greater in order to demonstrate increased LE strength, increased in balance, and decrease likelihood of falling.    Baseline 11/09/2020: <10 seconds    Time 8    Period Weeks    Status New    Target Date 01/04/21      PT LONG TERM GOAL #5   Title Pt will increase gait speed in 10MWT to >0.42m/s to demonstrate appropriate speed for limited community ambulation    Baseline 11/10/20 0.39m/s    Time 8    Period Weeks    Status New                   Plan - 12/01/20 1110     Clinical Impression Statement Pt tolerated session well evidenced by decrease in LBP from 5/10 to 3/10 following therapeutic exercise. Pt continues to display decreased LE strength, decreased core strength, and decreased activity tolerance. PT decreased exercise intensity this session to accomodate for patient fatigue. PT utilized multimodal cueing to ensure proper form/technique.    Personal Factors and Comorbidities Comorbidity 3+    Comorbidities chronic pain, fibromyalgia    Examination-Activity Limitations Lift;Bend;Locomotion Level;Stand;Stairs;Sleep;Sit    Examination-Participation Restrictions  Cleaning;Laundry;Other;Community Activity    Rehab Potential Fair    PT Frequency 2x /  week    PT Duration 8 weeks    PT Treatment/Interventions ADLs/Self Care Home Management;Cryotherapy;Electrical Stimulation;Moist Heat;Traction;Gait training;Stair training;Functional mobility training;Therapeutic activities;Therapeutic exercise;Balance training;Neuromuscular re-education;Patient/family education;Manual techniques;Passive range of motion;Dry needling;Taping;Spinal Manipulations;Joint Manipulations;Ultrasound;Iontophoresis 4mg /ml Dexamethasone    PT Next Visit Plan Initiate stretching, core and LE strengthening (record plank time & set goal)    PT Home Exercise Plan Sit to stands, monster walks, modified planks, repeated flexion             Patient will benefit from skilled therapeutic intervention in order to improve the following deficits and impairments:  Abnormal gait, Decreased balance, Decreased endurance, Decreased mobility, Difficulty walking, Hypomobility, Impaired sensation, Improper body mechanics, Decreased range of motion, Obesity, Pain, Decreased activity tolerance, Decreased strength, Impaired flexibility  Visit Diagnosis: Chronic low back pain without sciatica, unspecified back pain laterality     Problem List Patient Active Problem List   Diagnosis Date Noted   Night sweats 03/22/2020   Carpal tunnel syndrome 12/21/2019   Seasonal allergies 12/21/2019   Osteopenia 09/28/2019   Routine medical exam 09/17/2019   Chronic pain syndrome 09/09/2019   Insomnia 08/20/2019   Encounter to establish care with new doctor 08/20/2019   BMI 40.0-44.9, adult ([place]) 07/07/2019   Other bilateral secondary osteoarthritis of hip 05/20/2019   Chronic pain of both hips 04/16/2018   Prediabetes 01/23/2018   Hot flashes 01/23/2018   Thoracic myelopathy 12/26/2017   Syrinx of spinal cord ([place]) 05/10/2017   Lumbosacral spondylosis without myelopathy 09/05/2016   History of total knee arthroplasty, left 08/16/2014   Numbness in feet 05/17/2014   Multiple joint pain  12/09/2013   Hypertension 02/13/2013   OA (osteoarthritis) of knee 07/30/2012   GERD (gastroesophageal reflux disease) 06/19/2012   Gait instability 02/24/2012   Low back pain 02/18/2012   Depression 10/30/2011   Hyperlipidemia 02/17/2011   Fibromyalgia 12/01/2010   Dysesthesia 12/01/2010     Durwin Reges DPT Sharion Settler, SPT  Durwin Reges 12/01/2020, 2:27 PM  [facility] [address] [place], [address] Phone: [phone]   Fax:  [phone]  Name: Kristin Porter MRN: [id number] Date of Birth: 02/06/1961

## 2020-12-06 ENCOUNTER — Ambulatory Visit: Payer: Medicare Other | Admitting: Physical Therapy

## 2020-12-08 ENCOUNTER — Ambulatory Visit: Payer: Medicare Other | Admitting: Physical Therapy

## 2020-12-12 ENCOUNTER — Ambulatory Visit (INDEPENDENT_AMBULATORY_CARE_PROVIDER_SITE_OTHER): Payer: Medicare Other | Admitting: Internal Medicine

## 2020-12-12 ENCOUNTER — Encounter: Payer: Self-pay | Admitting: Internal Medicine

## 2020-12-12 ENCOUNTER — Other Ambulatory Visit: Payer: Self-pay

## 2020-12-12 VITALS — BP 148/74 | HR 78 | Temp 97.5°F | Resp 18 | Ht 59.0 in | Wt 205.4 lb

## 2020-12-12 DIAGNOSIS — I700 Atherosclerosis of aorta: Secondary | ICD-10-CM

## 2020-12-12 DIAGNOSIS — F339 Major depressive disorder, recurrent, unspecified: Secondary | ICD-10-CM

## 2020-12-12 DIAGNOSIS — M8588 Other specified disorders of bone density and structure, other site: Secondary | ICD-10-CM

## 2020-12-12 DIAGNOSIS — E66812 Obesity, class 2: Secondary | ICD-10-CM | POA: Insufficient documentation

## 2020-12-12 DIAGNOSIS — K2100 Gastro-esophageal reflux disease with esophagitis, without bleeding: Secondary | ICD-10-CM

## 2020-12-12 DIAGNOSIS — Z0001 Encounter for general adult medical examination with abnormal findings: Secondary | ICD-10-CM

## 2020-12-12 DIAGNOSIS — F5101 Primary insomnia: Secondary | ICD-10-CM

## 2020-12-12 DIAGNOSIS — I10 Essential (primary) hypertension: Secondary | ICD-10-CM

## 2020-12-12 DIAGNOSIS — M797 Fibromyalgia: Secondary | ICD-10-CM | POA: Diagnosis not present

## 2020-12-12 DIAGNOSIS — G894 Chronic pain syndrome: Secondary | ICD-10-CM | POA: Diagnosis not present

## 2020-12-12 DIAGNOSIS — R7303 Prediabetes: Secondary | ICD-10-CM

## 2020-12-12 DIAGNOSIS — Z6841 Body Mass Index (BMI) 40.0 and over, adult: Secondary | ICD-10-CM | POA: Insufficient documentation

## 2020-12-12 DIAGNOSIS — E782 Mixed hyperlipidemia: Secondary | ICD-10-CM

## 2020-12-12 MED ORDER — TRIAMCINOLONE ACETONIDE 0.1 % EX CREA: 1.0000 "application " | TOPICAL_CREAM | Freq: Two times a day (BID) | CUTANEOUS | 0 refills | Status: AC

## 2020-12-12 MED ORDER — ALBUTEROL SULFATE (2.5 MG/3ML) 0.083% IN NEBU: 2.5000 mg | INHALATION_SOLUTION | Freq: Four times a day (QID) | RESPIRATORY_TRACT | 0 refills | Status: DC | PRN

## 2020-12-12 MED ORDER — PANTOPRAZOLE SODIUM 40 MG PO TBEC: 40.0000 mg | DELAYED_RELEASE_TABLET | Freq: Two times a day (BID) | ORAL | 1 refills | Status: DC

## 2020-12-12 NOTE — Assessment & Plan Note (Signed)
C-Met and lipid profile today Continue Repatha per cardiology Encourage low-fat diet 

## 2020-12-12 NOTE — Assessment & Plan Note (Signed)
A1c today Encourage low-carb diet and exercise for weight loss 

## 2020-12-12 NOTE — Assessment & Plan Note (Signed)
C-Met and lipid profile today Continue Repatha per cardiology Encourage low-fat diet

## 2020-12-12 NOTE — Assessment & Plan Note (Signed)
We will have her cut her Amitriptyline in half to 12.5 mg nightly to see if this improves her irritability

## 2020-12-12 NOTE — Assessment & Plan Note (Signed)
Encourage diet and exercise for weight loss 

## 2020-12-12 NOTE — Assessment & Plan Note (Signed)
Stable on Paroxetine Support offered

## 2020-12-12 NOTE — Assessment & Plan Note (Signed)
Continue Meloxicam, Baclofen, Pregabalin and Hydrocodone as prescribed for pain management

## 2020-12-12 NOTE — Progress Notes (Signed)
Subjective:    Patient ID: Kristin Porter, female    DOB: [birth year], 60 y.o.   MRN: [id number]  HPI  Patient presents to the clinic today for her annual exam.  She is also due to follow-up chronic conditions.  HTN: Her BP today is 148/74.  She is taking Amlodipine and HCTZ as prescribed.  She feels like her blood pressure is elevated because of her chronic pain.  ECG from 08/2020 reviewed.  GERD: Triggered by her hernia.  She has breakthrough on Omeprazole. She takes Tums OTC as needed with some relief of symptoms. There is no upper GI on file.  Osteopenia: Bone density from 09/2019 reviewed.  She is not currently taking any Calcium or Vitamin D OTC.  She does not get weightbearing exercise and daily.  HLD with Aortic Atherosclerosis: Her last LDL was 81, triglycerides 87, 08/2020.  She is taking Repatha as prescribed.  She tries to consume a low-fat diet.  She follows with cardiology.  Prediabetes: Her last A1c was 5.9%, 03/2020.  She is not currently taking any oral diabetic medication.  She does not check her sugars.  Depression: Chronic, managed on Paroxetine.  She is not currently seeing a therapist.  She denies anxiety, SI/HI.  Insomnia:  She is taking Amitriptyline but reports this leaves her irritable the next morning. She has tried Ambien, Trazadone in the past with minimal relief. There is no sleep study on file.  Chronic Pain/Fibromyalgia: Worsening back pain. Managed on Meloxicam, Baclofen, Pregabalin and Hydrocodone.  She follows with pain management.  Flu: 02/2012 Tetanus: 04/2018 COVID: never Shingrix: never Pap smear: 08/2019 Mammogram: 01/2020 Bone density: 09/2019 Colon screening: 02/2019 Vision screening: annually Dentist: biannually  Diet: She does eat meat. She consumes fruits and veggies. She tries to avoid fried foods. She drinks mostly water, some juice. Exercise: Exercise  Review of Systems     Past Medical History:  Diagnosis Date   Arthritis     Bilateral hips   Fibromyalgia    Hypertension    Neuropathy     Current Outpatient Medications  Medication Sig Dispense Refill   albuterol (PROVENTIL) (2.5 MG/3ML) 0.083% nebulizer solution Take 2.5 mg by nebulization every 6 (six) hours as needed for wheezing or shortness of breath.     amLODipine (NORVASC) 10 MG tablet Take 1 tablet (10 mg total) by mouth daily. 90 tablet 1   baclofen (LIORESAL) 10 MG tablet Take 1 tablet (10 mg total) by mouth 4 (four) times daily as needed for muscle spasms. 120 each 5   Blood Pressure Monitoring (BLOOD PRESSURE KIT) DEVI 1 Units by Does not apply route in the morning and at bedtime. 1 each 0   Evolocumab (REPATHA SURECLICK) 628 MG/ML SOAJ Inject 1 pen into the skin every 14 (fourteen) days. 2 mL 11   fluticasone (FLONASE) 50 MCG/ACT nasal spray Place 1 spray into both nostrils 2 (two) times daily. 9.9 mL 1   hydrochlorothiazide (HYDRODIURIL) 12.5 MG tablet Take 1 tablet (12.5 mg total) by mouth daily. 90 tablet 0   HYDROcodone-acetaminophen (NORCO) 10-325 MG tablet Take 1 tablet by mouth every 12 (twelve) hours as needed for severe pain. Must last 30 days. 60 tablet 0   meloxicam (MOBIC) 15 MG tablet Take 1 tablet (15 mg total) by mouth daily as needed for pain. 30 tablet 5   omeprazole (PRILOSEC) 40 MG capsule Take 1 capsule (40 mg total) by mouth in the morning and at bedtime. 60 capsule 3  PARoxetine (PAXIL) 10 MG tablet Take 1 tablet (10 mg total) by mouth daily. 90 tablet 0   pregabalin (LYRICA) 75 MG capsule Take 2 capsules (150 mg total) by mouth at bedtime. 60 capsule 2   No current facility-administered medications for this visit.    Allergies  Allergen Reactions   Tizanidine Other (See Comments)    Throat closing and trouble breathing    Lisinopril Cough   Tramadol Itching    Can take with Benadryl    Family History  Family history unknown: Yes    Social History   Socioeconomic History   Marital status: Divorced    Spouse  name: Not on file   Number of children: Not on file   Years of education: Not on file   Highest education level: Not on file  Occupational History   Occupation: disability   Tobacco Use   Smoking status: Former    Types: Cigarettes    Quit date: 1970    Years since quitting: 52.5   Smokeless tobacco: Never  Vaping Use   Vaping Use: Never used  Substance and Sexual Activity   Alcohol use: Not Currently   Drug use: Never   Sexual activity: Not Currently  Other Topics Concern   Not on file  Social History Narrative   Not on file   Social Determinants of Health   Financial Resource Strain: Low Risk    Difficulty of Paying Living Expenses: Not hard at all  Food Insecurity: No Food Insecurity   Worried About Charity fundraiser in the Last Year: Never true   [place] in the Last Year: Never true  Transportation Needs: No Transportation Needs   Lack of Transportation (Medical): No   Lack of Transportation (Non-Medical): No  Physical Activity: Inactive   Days of Exercise per Week: 0 days   Minutes of Exercise per Session: 0 min  Stress: No Stress Concern Present   Feeling of Stress : Not at all  Social Connections: Not on file  Intimate Partner Violence: Not on file     Constitutional: Denies fever, malaise, fatigue, headache or abrupt weight changes.  HEENT: Patient reports sore throat from acid reflux.  Denies eye pain, eye redness, ear pain, ringing in the ears, wax buildup, runny nose, nasal congestion, bloody nose. Respiratory: Patient reports nocturnal cough from acid reflux.  Denies difficulty breathing, shortness of breath, or sputum production.   Cardiovascular: Denies chest pain, chest tightness, palpitations or swelling in the hands or feet.  Gastrointestinal: Denies abdominal pain, bloating, constipation, diarrhea or blood in the stool.  GU: Denies urgency, frequency, pain with urination, burning sensation, blood in urine, odor or  discharge. Musculoskeletal: Patient reports chronic muscle and joint pain.  Denies decrease in range of motion, difficulty with gait, or joint swelling.  Skin: Patient reports dry skin of face.  Denies redness, lesions or ulcercations.  Neurological: Patient reports insomnia.  Denies dizziness, difficulty with memory, difficulty with speech or problems with balance and coordination.  Psych: Patient has a history of depression.  Denies anxiety, SI/HI.  No other specific complaints in a complete review of systems (except as listed in HPI above).  Objective:   Physical Exam BP (!) 148/74 (BP Location: Right Arm, Patient Position: Sitting, Cuff Size: Large)   Pulse 78   Temp (!) 97.5 F (36.4 C) (Temporal)   Resp 18   Ht '4\' 11"'  (1.499 m)   Wt 205 lb 6.4 oz (93.2 kg)  SpO2 100%   BMI 41.49 kg/m   Wt Readings from Last 3 Encounters:  10/18/20 198 lb (89.8 kg)  10/13/20 198 lb (89.8 kg)  09/05/20 207 lb (93.9 kg)    General: Appears her stated age, obese, in NAD. Skin: Warm, dry and intact.  Dry skin noted to face. HEENT: Head: normal shape and size; Eyes: sclera white and EOMs intact;  Cardiovascular: Normal rate and rhythm. S1,S2 noted.  No murmur, rubs or gallops noted. No JVD or BLE edema. No carotid bruits noted. Pulmonary/Chest: Normal effort and positive vesicular breath sounds. No respiratory distress. No wheezes, rales or ronchi noted.  Abdomen: Soft and nontender. Normal bowel sounds.  Musculoskeletal: Walks with cane.  Gait slow and steady with assistive device. Neurological: Alert and oriented.  Psychiatric: Mood and affect normal. Behavior is normal. Judgment and thought content normal.    BMET    Component Value Date/Time   NA 140 03/22/2020 0911   K 4.4 03/22/2020 0911   CL 103 03/22/2020 0911   CO2 28 03/22/2020 0911   GLUCOSE 103 (H) 03/22/2020 0911   BUN 15 03/22/2020 0911   CREATININE 0.71 03/22/2020 0911   CALCIUM 9.7 03/22/2020 0911   GFRNONAA 92  03/22/2020 0911   GFRAA 107 03/22/2020 0911    Lipid Panel     Component Value Date/Time   CHOL 145 09/08/2020 0914   TRIG 87 09/08/2020 0914   HDL 47 09/08/2020 0914   CHOLHDL 3.1 09/08/2020 0914   VLDL 17 09/08/2020 0914   LDLCALC 81 09/08/2020 0914   LDLCALC 167 (H) 03/22/2020 0911    CBC    Component Value Date/Time   WBC 7.3 03/22/2020 0911   RBC 4.84 03/22/2020 0911   HGB 14.1 03/22/2020 0911   HCT 42.4 03/22/2020 0911   PLT 319 03/22/2020 0911   MCV 87.6 03/22/2020 0911   MCH 29.1 03/22/2020 0911   MCHC 33.3 03/22/2020 0911   RDW 14.3 03/22/2020 0911   LYMPHSABS 2,051 03/22/2020 0911   MONOABS 0.8 12/23/2018 1153   EOSABS 73 03/22/2020 0911   BASOSABS 37 03/22/2020 0911    Hgb A1C Lab Results  Component Value Date   HGBA1C 5.9 (H) 03/22/2020            Assessment & Plan:   Preventative Health Maintenance:  Encouraged her to get a flu shot in the fall Tetanus UTD She declines COVID-vaccine. Encouraged her to get her shingles vaccine at the pharmacy Pap smear UTD Mammogram due 01/2021 Bone density UTD Colon screening UTD Encouraged her to consume a balanced diet and exercise regimen Advised her to see an eye doctor and dentist annually Will check CBC, c-Met, lipid, A1c today  RTC in 6 months, follow-up chronic conditions Webb Silversmith, NP This visit occurred during the SARS-CoV-2 public health emergency.  Safety protocols were in place, including screening questions prior to the visit, additional usage of staff PPE, and extensive cleaning of exam room while observing appropriate contact time as indicated for disinfecting solutions.

## 2020-12-12 NOTE — Assessment & Plan Note (Signed)
Will change omeprazole to Pantoprazole 40 mg twice daily Referral to GI for evaluation/treatment of hiatal hernia with, possible surgical intervention Encourage weight loss as this can help reduce reflux symptoms CBC and c-Met today

## 2020-12-12 NOTE — Assessment & Plan Note (Signed)
Elevated today Continue Amlodipine and HCTZ Reinforced DASH diet and exercise weight loss C-Met today

## 2020-12-12 NOTE — Assessment & Plan Note (Signed)
Encouraged daily weight bearing exercise 

## 2020-12-12 NOTE — Patient Instructions (Signed)
Health Maintenance, Female Adopting a healthy lifestyle and getting preventive care are important in promoting health and wellness. Ask your health care provider about: The right schedule for you to have regular tests and exams. Things you can do on your own to prevent diseases and keep yourself healthy. What should I know about diet, weight, and exercise? Eat a healthy diet  Eat a diet that includes plenty of vegetables, fruits, low-fat dairy products, and lean protein. Do not eat a lot of foods that are high in solid fats, added sugars, or sodium.  Maintain a healthy weight Body mass index (BMI) is used to identify weight problems. It estimates body fat based on height and weight. Your health care provider can help determineyour BMI and help you achieve or maintain a healthy weight. Get regular exercise Get regular exercise. This is one of the most important things you can do for your health. Most adults should: Exercise for at least 150 minutes each week. The exercise should increase your heart rate and make you sweat (moderate-intensity exercise). Do strengthening exercises at least twice a week. This is in addition to the moderate-intensity exercise. Spend less time sitting. Even light physical activity can be beneficial. Watch cholesterol and blood lipids Have your blood tested for lipids and cholesterol at 62 years of age, then havethis test every 5 years. Have your cholesterol levels checked more often if: Your lipid or cholesterol levels are high. You are older than 62 years of age. You are at high risk for heart disease. What should I know about cancer screening? Depending on your health history and family history, you may need to have cancer screening at various ages. This may include screening for: Breast cancer. Cervical cancer. Colorectal cancer. Skin cancer. Lung cancer. What should I know about heart disease, diabetes, and high blood pressure? Blood pressure and heart  disease High blood pressure causes heart disease and increases the risk of stroke. This is more likely to develop in people who have high blood pressure readings, are of African descent, or are overweight. Have your blood pressure checked: Every 3-5 years if you are 18-39 years of age. Every year if you are 40 years old or older. Diabetes Have regular diabetes screenings. This checks your fasting blood sugar level. Have the screening done: Once every three years after age 40 if you are at a normal weight and have a low risk for diabetes. More often and at a younger age if you are overweight or have a high risk for diabetes. What should I know about preventing infection? Hepatitis B If you have a higher risk for hepatitis B, you should be screened for this virus. Talk with your health care provider to find out if you are at risk forhepatitis B infection. Hepatitis C Testing is recommended for: Everyone born from 1945 through 1965. Anyone with known risk factors for hepatitis C. Sexually transmitted infections (STIs) Get screened for STIs, including gonorrhea and chlamydia, if: You are sexually active and are younger than 62 years of age. You are older than 62 years of age and your health care provider tells you that you are at risk for this type of infection. Your sexual activity has changed since you were last screened, and you are at increased risk for chlamydia or gonorrhea. Ask your health care provider if you are at risk. Ask your health care provider about whether you are at high risk for HIV. Your health care provider may recommend a prescription medicine to help   prevent HIV infection. If you choose to take medicine to prevent HIV, you should first get tested for HIV. You should then be tested every 3 months for as long as you are taking the medicine. Pregnancy If you are about to stop having your period (premenopausal) and you may become pregnant, seek counseling before you get  pregnant. Take 400 to 800 micrograms (mcg) of folic acid every day if you become pregnant. Ask for birth control (contraception) if you want to prevent pregnancy. Osteoporosis and menopause Osteoporosis is a disease in which the bones lose minerals and strength with aging. This can result in bone fractures. If you are 65 years old or older, or if you are at risk for osteoporosis and fractures, ask your health care provider if you should: Be screened for bone loss. Take a calcium or vitamin D supplement to lower your risk of fractures. Be given hormone replacement therapy (HRT) to treat symptoms of menopause. Follow these instructions at home: Lifestyle Do not use any products that contain nicotine or tobacco, such as cigarettes, e-cigarettes, and chewing tobacco. If you need help quitting, ask your health care provider. Do not use street drugs. Do not share needles. Ask your health care provider for help if you need support or information about quitting drugs. Alcohol use Do not drink alcohol if: Your health care provider tells you not to drink. You are pregnant, may be pregnant, or are planning to become pregnant. If you drink alcohol: Limit how much you use to 0-1 drink a day. Limit intake if you are breastfeeding. Be aware of how much alcohol is in your drink. In the [place] one drink equals one 12 oz bottle of beer (355 mL), one 5 oz glass of wine (148 mL), or one 1 oz glass of hard liquor (44 mL). General instructions Schedule regular health, dental, and eye exams. Stay current with your vaccines. Tell your health care provider if: You often feel depressed. You have ever been abused or do not feel safe at home. Summary Adopting a healthy lifestyle and getting preventive care are important in promoting health and wellness. Follow your health care provider's instructions about healthy diet, exercising, and getting tested or screened for diseases. Follow your health care provider's  instructions on monitoring your cholesterol and blood pressure. This information is not intended to replace advice given to you by your health care provider. Make sure you discuss any questions you have with your healthcare provider. Document Revised: 04/30/2018 Document Reviewed: 04/30/2018 Elsevier Patient Education  2022 [employer]  

## 2020-12-13 ENCOUNTER — Ambulatory Visit: Payer: Medicare Other | Admitting: Physical Therapy

## 2020-12-13 LAB — COMPLETE METABOLIC PANEL WITH GFR
AG Ratio: 1.3 (calc) (ref 1.0–2.5)
ALT: 16 U/L (ref 6–29)
AST: 17 U/L (ref 10–35)
Albumin: 4.2 g/dL (ref 3.6–5.1)
Alkaline phosphatase (APISO): 65 U/L (ref 37–153)
BUN: 11 mg/dL (ref 7–25)
CO2: 24 mmol/L (ref 20–32)
Calcium: 9.1 mg/dL (ref 8.6–10.4)
Chloride: 107 mmol/L (ref 98–110)
Creat: 0.63 mg/dL (ref 0.50–1.05)
Globulin: 3.3 g/dL (calc) (ref 1.9–3.7)
Glucose, Bld: 102 mg/dL — ABNORMAL HIGH (ref 65–99)
Potassium: 4.4 mmol/L (ref 3.5–5.3)
Sodium: 141 mmol/L (ref 135–146)
Total Bilirubin: 0.3 mg/dL (ref 0.2–1.2)
Total Protein: 7.5 g/dL (ref 6.1–8.1)
eGFR: 100 mL/min/{1.73_m2} (ref >=60)

## 2020-12-13 LAB — LIPID PANEL
Cholesterol: 206 mg/dL — ABNORMAL HIGH (ref <200)
HDL: 51 mg/dL (ref >=50)
LDL Cholesterol (Calc): 131 mg/dL (calc) — ABNORMAL HIGH
Non-HDL Cholesterol (Calc): 155 mg/dL (calc) — ABNORMAL HIGH (ref <130)
Total CHOL/HDL Ratio: 4 (calc) (ref <5.0)
Triglycerides: 129 mg/dL (ref <150)

## 2020-12-13 LAB — CBC
HCT: 39.9 % (ref 35.0–45.0)
Hemoglobin: 13.5 g/dL (ref 11.7–15.5)
MCH: 29.8 pg (ref 27.0–33.0)
MCHC: 33.8 g/dL (ref 32.0–36.0)
MCV: 88.1 fL (ref 80.0–100.0)
MPV: 9.7 fL (ref 7.5–12.5)
Platelets: 286 10*3/uL (ref 140–400)
RBC: 4.53 10*6/uL (ref 3.80–5.10)
RDW: 13.5 % (ref 11.0–15.0)
WBC: 6.6 10*3/uL (ref 3.8–10.8)

## 2020-12-13 LAB — SEDIMENTATION RATE: Sed Rate: 34 mm/h — ABNORMAL HIGH (ref 0–30)

## 2020-12-13 LAB — HEMOGLOBIN A1C
Hgb A1c MFr Bld: 5.7 % of total Hgb — ABNORMAL HIGH (ref <5.7)
Mean Plasma Glucose: 117 mg/dL
eAG (mmol/L): 6.5 mmol/L

## 2020-12-13 LAB — C-REACTIVE PROTEIN: CRP: 15.2 mg/L — ABNORMAL HIGH (ref <8.0)

## 2020-12-15 ENCOUNTER — Ambulatory Visit: Payer: Medicare Other | Admitting: Physical Therapy

## 2020-12-20 ENCOUNTER — Ambulatory Visit: Payer: Medicare Other | Admitting: Physical Therapy

## 2020-12-21 ENCOUNTER — Encounter: Payer: Self-pay | Admitting: Internal Medicine

## 2020-12-22 ENCOUNTER — Ambulatory Visit
Payer: Medicare Other | Attending: Student in an Organized Health Care Education/Training Program | Admitting: Physical Therapy

## 2020-12-27 ENCOUNTER — Ambulatory Visit: Payer: Medicare Other | Admitting: Physical Therapy

## 2020-12-29 ENCOUNTER — Ambulatory Visit: Payer: Medicare Other | Admitting: Physical Therapy

## 2020-12-31 ENCOUNTER — Ambulatory Visit
Admission: EM | Admit: 2020-12-31 | Discharge: 2020-12-31 | Disposition: A | Discharge status: Home / Self Care | Payer: Medicare Other | Attending: Family Medicine | Admitting: Family Medicine

## 2020-12-31 ENCOUNTER — Other Ambulatory Visit: Payer: Self-pay

## 2020-12-31 DIAGNOSIS — J069 Acute upper respiratory infection, unspecified: Secondary | ICD-10-CM | POA: Diagnosis present

## 2020-12-31 DIAGNOSIS — U071 COVID-19: Secondary | ICD-10-CM | POA: Insufficient documentation

## 2020-12-31 MED ORDER — BENZONATATE 100 MG PO CAPS: 200.0000 mg | ORAL_CAPSULE | Freq: Three times a day (TID) | ORAL | 0 refills | Status: DC

## 2020-12-31 MED ORDER — IPRATROPIUM BROMIDE 0.06 % NA SOLN: 2.0000 | Freq: Four times a day (QID) | NASAL | 12 refills | Status: DC

## 2020-12-31 MED ORDER — PROMETHAZINE-DM 6.25-15 MG/5ML PO SYRP: 5.0000 mL | ORAL_SOLUTION | Freq: Four times a day (QID) | ORAL | 0 refills | Status: DC | PRN

## 2020-12-31 NOTE — ED Provider Notes (Signed)
[facility]    CSN: [id number] Arrival date & time: 12/31/20  1254      History   Chief Complaint Chief Complaint  Patient presents with   Cough   Generalized Body Aches    HPI Kristin Porter is a 60 y.o. female.   HPI  60 year old female here for evaluation of respiratory complaints.  Patient is here because her son was exposed to [name] at work and she is afraid that she has contracted it.  She has been experiencing body aches, runny nose with nasal congestion and brown nasal discharge, ear pain, fever with a T-max of 101,, productive cough or white sputum with wheezing, and a headache for the past 3 days.  She states that she developed nausea this morning.  She denies shortness of breath, vomiting, or diarrhea.  Past Medical History:  Diagnosis Date   Arthritis    Bilateral hips   Fibromyalgia    Hypertension    Neuropathy     Patient Active Problem List   Diagnosis Date Noted   Aortic atherosclerosis ([place]) 12/12/2020   Morbid obesity ([place]) 12/12/2020   Osteopenia 09/28/2019   Chronic pain syndrome 09/09/2019   Insomnia 08/20/2019   Prediabetes 01/23/2018   Hypertension 02/13/2013   GERD (gastroesophageal reflux disease) 06/19/2012   Depression 10/30/2011   Hyperlipidemia 02/17/2011   Fibromyalgia 12/01/2010    Past Surgical History:  Procedure Laterality Date   BREAST BIOPSY Right 2018   benign, clip was placed    OB History   No obstetric history on file.      Home Medications    Prior to Admission medications   Medication Sig Start Date End Date Taking? Authorizing Provider  albuterol (PROVENTIL) (2.5 MG/3ML) 0.083% nebulizer solution Take 3 mLs (2.5 mg total) by nebulization every 6 (six) hours as needed for wheezing or shortness of breath. 12/12/20  Yes Baity, Coralie Keens, NP  amitriptyline (ELAVIL) 25 MG tablet Take 12.5 mg by mouth at bedtime. 09/20/20  Yes [provider]  amLODipine (NORVASC) 10 MG tablet Take 1 tablet  (10 mg total) by mouth daily. 08/26/20 08/26/21 Yes Karamalegos, Devonne Doughty, DO  baclofen (LIORESAL) 10 MG tablet Take 1 tablet (10 mg total) by mouth 4 (four) times daily as needed for muscle spasms. 10/13/20  Yes Gillis Santa, MD  benzonatate (TESSALON) 100 MG capsule Take 2 capsules (200 mg total) by mouth every 8 (eight) hours. 12/31/20  Yes Margarette Canada, NP  Blood Pressure Monitoring (BLOOD PRESSURE KIT) DEVI 1 Units by Does not apply route in the morning and at bedtime. 08/20/19  Yes Malfi, Lupita Raider, FNP  Evolocumab (REPATHA SURECLICK) 225 MG/ML SOAJ Inject 1 pen into the skin every 14 (fourteen) days. 04/06/20  Yes Agbor-Etang, Aaron Edelman, MD  fluticasone (FLONASE) 50 MCG/ACT nasal spray Place 1 spray into both nostrils 2 (two) times daily. 12/21/19  Yes Malfi, Lupita Raider, FNP  hydrochlorothiazide (HYDRODIURIL) 12.5 MG tablet Take 1 tablet (12.5 mg total) by mouth daily. 08/26/20  Yes Karamalegos, Alexander J, DO  ipratropium (ATROVENT) 0.06 % nasal spray Place 2 sprays into both nostrils 4 (four) times daily. 12/31/20  Yes Margarette Canada, NP  meloxicam (MOBIC) 15 MG tablet Take 1 tablet (15 mg total) by mouth daily as needed for pain. 10/13/20  Yes Gillis Santa, MD  pantoprazole (PROTONIX) 40 MG tablet Take 1 tablet (40 mg total) by mouth 2 (two) times daily. 12/12/20  Yes Jearld Fenton, NP  PARoxetine (PAXIL) 10 MG tablet Take  1 tablet (10 mg total) by mouth daily. 08/26/20  Yes Karamalegos, Devonne Doughty, DO  pregabalin (LYRICA) 75 MG capsule Take 2 capsules (150 mg total) by mouth at bedtime. Patient taking differently: Take 75 mg by mouth at bedtime. 08/26/20  Yes Karamalegos, Devonne Doughty, DO  promethazine-dextromethorphan (PROMETHAZINE-DM) 6.25-15 MG/5ML syrup Take 5 mLs by mouth 4 (four) times daily as needed. 12/31/20  Yes Margarette Canada, NP  triamcinolone cream (KENALOG) 0.1 % Apply 1 application topically 2 (two) times daily. 12/12/20  Yes Baity, Coralie Keens, NP    Family History Family History  Family history  unknown: Yes    Social History Social History   Tobacco Use   Smoking status: Former    Types: Cigarettes    Quit date: 1970    Years since quitting: 52.6   Smokeless tobacco: Never  Vaping Use   Vaping Use: Never used  Substance Use Topics   Alcohol use: Not Currently   Drug use: Never     Allergies   Tizanidine, Lisinopril, and Tramadol   Review of Systems Review of Systems  Constitutional:  Positive for fever. Negative for activity change and appetite change.  HENT:  Positive for congestion, ear pain, rhinorrhea and sore throat.   Respiratory:  Positive for cough and wheezing. Negative for shortness of breath.   Gastrointestinal:  Positive for nausea. Negative for diarrhea and vomiting.  Musculoskeletal:  Positive for arthralgias and myalgias.  Neurological:  Positive for headaches.  Hematological: Negative.   Psychiatric/Behavioral: Negative.      Physical Exam Triage Vital Signs ED Triage Vitals  Enc Vitals Group     BP 12/31/20 1341 123/89     Pulse Rate 12/31/20 1341 82     Resp 12/31/20 1341 18     Temp 12/31/20 1341 98.5 F (36.9 C)     Temp Source 12/31/20 1341 Oral     SpO2 12/31/20 1341 97 %     Weight 12/31/20 1339 203 lb (92.1 kg)     Height 12/31/20 1339 4' 11" (1.499 m)     Head Circumference --      Peak Flow --      Pain Score 12/31/20 1339 0     Pain Loc --      Pain Edu? --      Excl. in [place]? --    No data found.  Updated Vital Signs BP 123/89 (BP Location: Left Arm)   Pulse 82   Temp 98.5 F (36.9 C) (Oral)   Resp 18   Ht 4' 11" (1.499 m)   Wt 203 lb (92.1 kg)   SpO2 97%   BMI 41.00 kg/m   Visual Acuity Right Eye Distance:   Left Eye Distance:   Bilateral Distance:    Right Eye Near:   Left Eye Near:    Bilateral Near:     Physical Exam Vitals and nursing note reviewed.  Constitutional:      General: She is not in acute distress.    Appearance: Normal appearance. She is normal weight. She is not ill-appearing.   HENT:     Head: Normocephalic and atraumatic.     Right Ear: Tympanic membrane, ear canal and external ear normal. There is no impacted cerumen.     Left Ear: Tympanic membrane, ear canal and external ear normal. There is no impacted cerumen.     Nose: Congestion and rhinorrhea present.     Mouth/Throat:     Mouth: Mucous membranes are moist.  Pharynx: Oropharynx is clear. Posterior oropharyngeal erythema present.  Cardiovascular:     Rate and Rhythm: Normal rate and regular rhythm.     Pulses: Normal pulses.     Heart sounds: Normal heart sounds. No murmur heard.   No gallop.  Pulmonary:     Effort: Pulmonary effort is normal.     Breath sounds: Normal breath sounds. No wheezing, rhonchi or rales.  Musculoskeletal:     Cervical back: Normal range of motion and neck supple.  Lymphadenopathy:     Cervical: Cervical adenopathy present.  Skin:    General: Skin is warm and dry.     Capillary Refill: Capillary refill takes less than 2 seconds.     Findings: No erythema or rash.  Neurological:     General: No focal deficit present.     Mental Status: She is alert and oriented to person, place, and time.  Psychiatric:        Mood and Affect: Mood normal.        Thought Content: Thought content normal.        Judgment: Judgment normal.     UC Treatments / Results  Labs (all labs ordered are listed, but only abnormal results are displayed) Labs Reviewed  SARS CORONAVIRUS 2 (TAT 6-24 HRS)    EKG   Radiology No results found.  Procedures Procedures (including critical care time)  Medications Ordered in UC Medications - No data to display  Initial Impression / Assessment and Plan / UC Course  I have reviewed the triage vital signs and the nursing notes.  Pertinent labs & imaging results that were available during my care of the patient were reviewed by me and considered in my medical decision making (see chart for details).  Patient is a pleasant, nontoxic-appearing  60 year old female here for evaluation of respiratory symptoms that been going on for the past 3 days.  Her son was exposed to a coworker who was positive for COVID and she is concerned that he brought home to her.  Patient has a history of diabetes and hypertension as well as neuropathy and fibromyalgia.  She does have an albuterol nebulizer at home but there is no respiratory diagnosis present in her chart.  Patient's physical exam reveals pearly gray tympanic membranes bilaterally with a normal light reflex and clear external auditory canals.  Nasal mucosa is erythematous and edematous with clear nasal discharge.  Oropharyngeal exam reveals mild posterior oropharyngeal erythema with clear postnasal drip.  Patient does have bilateral anterior cervical lymphadenopathy on exam.  Cardiopulmonary exam reveals clear lung sounds in all fields.  Will swab patient for COVID and discharged home to isolate pending the results.  Due to patient's comorbidities and age and she does qualify for antiviral therapy.  She has a CMP from 12/12/2020 in her chart which shows a GFR of 100.  If she is positive we will prescribe Paxlovid.  In the meantime will discharge home on Atrovent nasal spray for nasal congestion and postnasal drip, Tessalon Perles and Promethazine DM cough syrup as needed for cough and congestion.  Patient advised to use her albuterol nebulizer at home every 4-6 hours as needed for wheezing.  Return precautions and ER precautions reviewed with patient.   Final Clinical Impressions(s) / UC Diagnoses   Final diagnoses:  Viral URI with cough     Discharge Instructions      Isolate at home pending the results of your COVID test.  If you test positive then you will  have to quarantine for 5 days from the start of your symptoms.  After 5 days you can break quarantine if your symptoms have improved and you have not had a fever for 24 hours without taking Tylenol or ibuprofen.  Use over-the-counter Tylenol  and ibuprofen as needed for body aches and fever.  Use the Tessalon Perles during the day as needed for cough and the Promethazine DM cough syrup at nighttime as will make you drowsy.  Use the Atrovent nasal spray, 2 squirts in each nostril every 6 hours, as needed for nasal congestion.  Use your albuterol nebulizer every 4-6 hours as needed for wheezing.  If you develop any increased shortness of breath-especially at rest, you are unable to speak in full sentences, or is a late sign your lips are turning blue you need to go the ER for evaluation.      ED Prescriptions     Medication Sig Dispense Auth. Provider   benzonatate (TESSALON) 100 MG capsule Take 2 capsules (200 mg total) by mouth every 8 (eight) hours. 21 capsule Margarette Canada, NP   ipratropium (ATROVENT) 0.06 % nasal spray Place 2 sprays into both nostrils 4 (four) times daily. 15 mL Margarette Canada, NP   promethazine-dextromethorphan (PROMETHAZINE-DM) 6.25-15 MG/5ML syrup Take 5 mLs by mouth 4 (four) times daily as needed. 118 mL Margarette Canada, NP      PDMP not reviewed this encounter.   Margarette Canada, NP 12/31/20 1424

## 2020-12-31 NOTE — Discharge Instructions (Addendum)
Isolate at home pending the results of your COVID test.  If you test positive then you will have to quarantine for 5 days from the start of your symptoms.  After 5 days you can break quarantine if your symptoms have improved and you have not had a fever for 24 hours without taking Tylenol or ibuprofen.  Use over-the-counter Tylenol and ibuprofen as needed for body aches and fever.  Use the Tessalon Perles during the day as needed for cough and the Promethazine DM cough syrup at nighttime as will make you drowsy.  Use the Atrovent nasal spray, 2 squirts in each nostril every 6 hours, as needed for nasal congestion.  Use your albuterol nebulizer every 4-6 hours as needed for wheezing.  If you develop any increased shortness of breath-especially at rest, you are unable to speak in full sentences, or is a late sign your lips are turning blue you need to go the ER for evaluation.

## 2020-12-31 NOTE — ED Triage Notes (Signed)
Pt c/o body aches, cough, headache, scratchy throat and some nausea. Pt states symptoms started several days ago. Pt thinks her son may have come into contact with COVID, so she is concerned she may have COVID.

## 2021-01-01 ENCOUNTER — Telehealth: Payer: Self-pay | Admitting: Emergency Medicine

## 2021-01-01 LAB — SARS CORONAVIRUS 2 (TAT 6-24 HRS): SARS Coronavirus 2: POSITIVE — AB

## 2021-01-01 MED ORDER — NIRMATRELVIR/RITONAVIR (PAXLOVID)TABLET: 3.0000 | ORAL_TABLET | Freq: Two times a day (BID) | ORAL | 0 refills | Status: AC

## 2021-01-01 MED ORDER — NIRMATRELVIR/RITONAVIR (PAXLOVID)TABLET: 3.0000 | ORAL_TABLET | Freq: Two times a day (BID) | ORAL | 0 refills | Status: DC

## 2021-01-01 NOTE — Telephone Encounter (Signed)
Patient tested positive for COVID-19.  Due to her comorbidities of obesity, hypertension, and diabetes I have prescribed Paxlovid.  I spoke with the patient on the phone this morning after verifying her name and date of birth and informed her that she was positive.  She was already aware as she had read her results on MyChart.  I also informed the patient that I had sent in a prescription for Paxlovid and that she was to start it today.  She will take it twice a day for 5 days.  ER precautions reviewed with patient.

## 2021-01-01 NOTE — Addendum Note (Signed)
Addended by: Margarette Canada on: 01/01/2021 09:35 AM   Modules accepted: Orders

## 2021-01-02 ENCOUNTER — Telehealth: Payer: Self-pay | Admitting: Internal Medicine

## 2021-01-02 ENCOUNTER — Telehealth (HOSPITAL_COMMUNITY): Payer: Self-pay | Admitting: Emergency Medicine

## 2021-01-02 MED ORDER — ONDANSETRON 4 MG PO TBDP: 4.0000 mg | ORAL_TABLET | Freq: Three times a day (TID) | ORAL | 0 refills | Status: DC | PRN

## 2021-01-02 NOTE — Telephone Encounter (Signed)
Patient needs a script sent over for the Albuterol Inhaler. Patient states that she was diagnosis with covid over the week at [facility].

## 2021-01-03 ENCOUNTER — Ambulatory Visit
Payer: Medicare Other | Attending: Student in an Organized Health Care Education/Training Program | Admitting: Student in an Organized Health Care Education/Training Program

## 2021-01-03 ENCOUNTER — Other Ambulatory Visit: Payer: Self-pay

## 2021-01-03 ENCOUNTER — Ambulatory Visit: Payer: Medicare Other | Admitting: Physical Therapy

## 2021-01-03 ENCOUNTER — Encounter: Payer: Self-pay | Admitting: Student in an Organized Health Care Education/Training Program

## 2021-01-03 DIAGNOSIS — M4714 Other spondylosis with myelopathy, thoracic region: Secondary | ICD-10-CM | POA: Diagnosis not present

## 2021-01-03 DIAGNOSIS — G894 Chronic pain syndrome: Secondary | ICD-10-CM | POA: Diagnosis not present

## 2021-01-03 DIAGNOSIS — Z96652 Presence of left artificial knee joint: Secondary | ICD-10-CM

## 2021-01-03 DIAGNOSIS — M25552 Pain in left hip: Secondary | ICD-10-CM

## 2021-01-03 DIAGNOSIS — M25551 Pain in right hip: Secondary | ICD-10-CM

## 2021-01-03 DIAGNOSIS — M797 Fibromyalgia: Secondary | ICD-10-CM | POA: Diagnosis not present

## 2021-01-03 DIAGNOSIS — M47817 Spondylosis without myelopathy or radiculopathy, lumbosacral region: Secondary | ICD-10-CM

## 2021-01-03 DIAGNOSIS — M545 Low back pain, unspecified: Secondary | ICD-10-CM | POA: Diagnosis not present

## 2021-01-03 DIAGNOSIS — G8929 Other chronic pain: Secondary | ICD-10-CM

## 2021-01-03 DIAGNOSIS — G950 Syringomyelia and syringobulbia: Secondary | ICD-10-CM

## 2021-01-03 MED ORDER — HYDROCODONE-ACETAMINOPHEN 10-325 MG PO TABS: 1.0000 | ORAL_TABLET | Freq: Two times a day (BID) | ORAL | 0 refills | Status: AC | PRN

## 2021-01-03 MED ORDER — PREGABALIN 75 MG PO CAPS: 150.0000 mg | ORAL_CAPSULE | Freq: Every day | ORAL | 2 refills | Status: DC

## 2021-01-03 MED ORDER — ALBUTEROL SULFATE HFA 108 (90 BASE) MCG/ACT IN AERS
2.0000 | INHALATION_SPRAY | Freq: Four times a day (QID) | RESPIRATORY_TRACT | 0 refills | Status: DC | PRN
Start: 2021-01-03 — End: 2022-03-01

## 2021-01-03 NOTE — Progress Notes (Signed)
Patient: Kristin Porter  Service Category: E/M  Provider: Gillis Santa, MD  DOB: 01-30-1961  DOS: 01/03/2021  Location: Office  MRN: [id number]  Setting: Ambulatory outpatient  Referring Provider: Verl Bangs, FNP  Type: Established Patient  Specialty: Interventional Pain Management  PCP: Jearld Fenton, NP  Location: Home  Delivery: TeleHealth     Virtual Encounter - Pain Management PROVIDER NOTE: Information contained herein reflects review and annotations entered in association with encounter. Interpretation of such information and data should be left to medically-trained personnel. Information provided to patient can be located elsewhere in the medical record under "Patient Instructions". Document created using STT-dictation technology, any transcriptional errors that may result from process are unintentional.    Contact & Pharmacy Preferred: [phone] Home: [phone] (home) Mobile: [phone] (mobile) E-mail: [email or web address]  [id number] - Kristine Linea, Lorina Rabon - [place] [address] Phone: [phone] Fax: [phone]  [id number] [place]: [phone]: [phone]  Pre-screening  Ms. Cage offered "in-person" vs "virtual" encounter. She indicated preferring virtual for this encounter.   Reason COVID-19*  Social distancing based on CDC and AMA recommendations.   I contacted DPajaroson 01/03/2021 via video conference.      I clearly identified myself as B8/29/2022 MD. I verified that I was speaking with the correct person using two identifiers (Name: Kristin Porter and date of birth: 1Jan 16, 1962.  Consent I sought verbal advanced consent from DPryorfor virtual visit interactions. I informed Kristin Porter of possible security and privacy concerns, risks, and limitations associated with providing  "not-in-person" medical evaluation and management services. I also informed Ms. Bogie of the availability of "in-person" appointments. Finally, I informed her that there would be a charge for the virtual visit and that she could be  personally, fully or partially, financially responsible for it. Ms. HStareexpressed understanding and agreed to proceed.   Historic Elements   Ms. Eleonora OERION HERMANSis a 60y.o. year old, female patient evaluated today after our last contact on 10/13/2020. Kristin Porter has a past medical history of Arthritis, Fibromyalgia, Hypertension, and Neuropathy. She also  has a past surgical history that includes Breast biopsy (Right, 2018). Ms. HNarayanhas a current medication list which includes the following prescription(s): albuterol, amitriptyline, amlodipine, baclofen, blood pressure kit, repatha sureclick, fluticasone, [START ON 01/05/2021] hydrocodone-acetaminophen, [START ON 02/04/2021] hydrocodone-acetaminophen, ipratropium, meloxicam, nirmatrelvir/ritonavir eua, pantoprazole, paroxetine, promethazine-dextromethorphan, triamcinolone cream, albuterol, ondansetron, and pregabalin. She  reports that she quit smoking about 52 years ago. Her smoking use included cigarettes. She has never used smokeless tobacco. She reports that she does not currently use alcohol. She reports that she does not use drugs. Ms. HHelmeris allergic to tizanidine, lisinopril, and tramadol.   HPI  Today, she is being contacted for medication management.  -VV given + Covid 19 -Polymyalgias and arthralgias pronounced to low back, bilateral hips.  History of fibromyalgia and lumbar spondylosis. -Refill of hydrocodone and Lyrica as below.  No change in dose.   Pharmacotherapy Assessment   Analgesic: Hydrocodone 10 mg twice daily as needed, quantity 60/month   Monitoring: [place] PMP: PDMP not reviewed this encounter.       Pharmacotherapy: No side-effects or adverse reactions reported. Compliance: No  problems identified. Effectiveness: Clinically acceptable. Plan: Refer to "POC". UDS:  Summary  Date Value Ref Range Status  10/13/2020 Note  Final  Comment:    ==================================================================== [employer] Select 13 (MW) ==================================================================== Test                             Result       Flag       Units  Drug Present and Declared for Prescription Verification   Hydrocodone                    697          EXPECTED   ng/mg creat   Hydromorphone                  365          EXPECTED   ng/mg creat   Dihydrocodeine                 240          EXPECTED   ng/mg creat   Norhydrocodone                 620          EXPECTED   ng/mg creat    Sources of hydrocodone include scheduled prescription medications.    Hydromorphone, dihydrocodeine and norhydrocodone are expected    metabolites of hydrocodone. Hydromorphone and dihydrocodeine are    also available as scheduled prescription medications.  Drug Present not Declared for Prescription Verification   Oxazepam                       21           UNEXPECTED ng/mg creat   Temazepam                      15           UNEXPECTED ng/mg creat    Oxazepam and temazepam are expected metabolites of diazepam.    Oxazepam is also an expected metabolite of other benzodiazepine    drugs, including chlordiazepoxide, prazepam, clorazepate, halazepam,    and temazepam.  Oxazepam and temazepam are available as scheduled    prescription medications.  ==================================================================== Test                      Result    Flag   Units      Ref Range   Creatinine              149              mg/dL      >=20 ==================================================================== Declared Medications:  The flagging and interpretation on this report are based on the  following declared medications.  Unexpected results may arise from  inaccuracies in  the declared medications.   **Note: The testing scope of this panel includes these medications:   Hydrocodone   **Note: The testing scope of this panel does not include the  following reported medications:   Acetaminophen  Albuterol  Amlodipine  Baclofen  Evolocumab (Repatha)  Fluticasone  Hydrochlorothiazide  Meloxicam  Omeprazole  Paroxetine  Pregabalin ==================================================================== For clinical consultation, please call [phone]. ====================================================================      Laboratory Chemistry Profile   Renal Lab Results  Component Value Date   BUN 11 12/12/2020   CREATININE 0.63 16/96/7893   BCR NOT APPLICABLE 81/05/7508   GFRAA 107 03/22/2020   GFRNONAA 92 03/22/2020    Hepatic Lab Results  Component Value  Date   AST 17 12/12/2020   ALT 16 12/12/2020    Electrolytes Lab Results  Component Value Date   NA 141 12/12/2020   K 4.4 12/12/2020   CL 107 12/12/2020   CALCIUM 9.1 12/12/2020    Bone No results found for: VD25OH, VD125OH2TOT, KV4259DG3, OV5643PI9, 25OHVITD1, 25OHVITD2, 25OHVITD3, TESTOFREE, TESTOSTERONE  Inflammation (CRP: Acute Phase) (ESR: Chronic Phase) Lab Results  Component Value Date   CRP 15.2 (H) 12/12/2020   ESRSEDRATE 34 (H) 12/12/2020         Note: Above Lab results reviewed.  Imaging  MM 3D SCREEN BREAST BILATERAL CLINICAL DATA:  Screening.  EXAM: DIGITAL SCREENING BILATERAL MAMMOGRAM WITH TOMO AND CAD  COMPARISON:  Previous exam(s).  ACR Breast Density Category b: There are scattered areas of fibroglandular density.  FINDINGS: There are no findings suspicious for malignancy. Images were processed with CAD.  IMPRESSION: No mammographic evidence of malignancy. A result letter of this screening mammogram will be mailed directly to the patient.  RECOMMENDATION: Screening mammogram in one year. (Code:[id number]-B-01Y)  BI-RADS CATEGORY  1:  Negative.  Electronically Signed   By: Lillia Mountain M.D.   On: 02/08/2020 12:15 MM Outside Films Mammo This examination belongs to an outside facility and is stored here for  comparison purposes only.  Contact the originating outside institution for  any associated report or interpretation. MM Outside Films Mammo This examination belongs to an outside facility and is stored here for  comparison purposes only.  Contact the originating outside institution for  any associated report or interpretation. MM Outside Films Mammo This examination belongs to an outside facility and is stored here for  comparison purposes only.  Contact the originating outside institution for  any associated report or interpretation. MM Outside Films Mammo This examination belongs to an outside facility and is stored here for  comparison purposes only.  Contact the originating outside institution for  any associated report or interpretation. MM Outside Films Mammo This examination belongs to an outside facility and is stored here for  comparison purposes only.  Contact the originating outside institution for  any associated report or interpretation.  Assessment  The primary encounter diagnosis was Thoracic myelopathy (due to syrinx, s/p excision 12/23/2017). Diagnoses of Fibromyalgia, Chronic pain syndrome, Chronic bilateral low back pain without sciatica, Syrinx of spinal cord (HCC), History of total knee arthroplasty, left, Chronic pain of both hips, and Lumbosacral spondylosis without myelopathy were also pertinent to this visit.  Plan of Care  Problem-specific:  No problem-specific Assessment & Plan notes found for this encounter.  Ms. LUCINDA SPELLS has a current medication list which includes the following long-term medication(s): albuterol, amlodipine, fluticasone, ipratropium, pantoprazole, paroxetine, albuterol, and pregabalin.  Pharmacotherapy (Medications Ordered): Meds ordered this encounter   Medications   HYDROcodone-acetaminophen (NORCO) 10-325 MG tablet    Sig: Take 1 tablet by mouth every 12 (twelve) hours as needed for severe pain. Must last 30 days.    Dispense:  60 tablet    Refill:  0    Chronic Pain: STOP Act (Not applicable) Fill 1 day early if closed on refill date. Avoid benzodiazepines within 8 hours of opioids   HYDROcodone-acetaminophen (NORCO) 10-325 MG tablet    Sig: Take 1 tablet by mouth every 12 (twelve) hours as needed for severe pain. Must last 30 days.    Dispense:  60 tablet    Refill:  0    Chronic Pain: STOP Act (Not applicable) Fill 1 day early if closed  on refill date. Avoid benzodiazepines within 8 hours of opioids   pregabalin (LYRICA) 75 MG capsule    Sig: Take 2 capsules (150 mg total) by mouth at bedtime.    Dispense:  60 capsule    Refill:  2   Orders:  No orders of the defined types were placed in this encounter.  Follow-up plan:   Return in about 9 weeks (around 03/07/2021) for Medication Management, in person.    Recent Visits Date Type Provider Dept  10/13/20 Office Visit Gillis Santa, MD [facility]  Showing recent visits within past 90 days and meeting all other requirements Today's Visits Date Type Provider Dept  01/03/21 Telemedicine Gillis Santa, MD [facility]  Showing today's visits and meeting all other requirements Future Appointments No visits were found meeting these conditions. Showing future appointments within next 90 days and meeting all other requirements I discussed the assessment and treatment plan with the patient. The patient was provided an opportunity to ask questions and all were answered. The patient agreed with the plan and demonstrated an understanding of the instructions.  Patient advised to call back or seek an in-person evaluation if the symptoms or condition worsens.  Duration of encounter: 65mnutes.  Note by: BGillis Santa MD Date: 01/03/2021; Time: 2:08 PM

## 2021-01-03 NOTE — Addendum Note (Signed)
Addended by: Jearld Fenton on: 01/03/2021 07:47 AM   Modules accepted: Orders

## 2021-01-03 NOTE — Telephone Encounter (Signed)
Rx for albuterol sent to pharmacy

## 2021-01-05 ENCOUNTER — Encounter: Payer: Medicare Other | Admitting: Physical Therapy

## 2021-01-09 ENCOUNTER — Encounter: Payer: Medicare Other | Admitting: Physical Therapy

## 2021-01-11 ENCOUNTER — Encounter: Payer: Medicare Other | Admitting: Physical Therapy

## 2021-01-16 ENCOUNTER — Encounter: Payer: Medicare Other | Admitting: Physical Therapy

## 2021-01-18 ENCOUNTER — Encounter: Payer: Medicare Other | Admitting: Physical Therapy

## 2021-02-08 DIAGNOSIS — H25013 Cortical age-related cataract, bilateral: Secondary | ICD-10-CM | POA: Diagnosis not present

## 2021-02-17 ENCOUNTER — Other Ambulatory Visit: Payer: Self-pay

## 2021-02-17 ENCOUNTER — Ambulatory Visit (INDEPENDENT_AMBULATORY_CARE_PROVIDER_SITE_OTHER): Payer: Medicare Other | Admitting: Gastroenterology

## 2021-02-17 ENCOUNTER — Encounter: Payer: Self-pay | Admitting: Gastroenterology

## 2021-02-17 VITALS — BP 125/82 | HR 79 | Temp 97.6°F | Ht 59.0 in | Wt 204.2 lb

## 2021-02-17 DIAGNOSIS — R1319 Other dysphagia: Secondary | ICD-10-CM

## 2021-02-17 DIAGNOSIS — K219 Gastro-esophageal reflux disease without esophagitis: Secondary | ICD-10-CM

## 2021-02-17 NOTE — Addendum Note (Signed)
Addended by: Eliseo Squires on: 02/17/2021 10:01 AM   Modules accepted: Orders, SmartSet

## 2021-02-17 NOTE — Progress Notes (Signed)
Cephas Darby, MD [address]  [place]  [place], [place] [address]  Main: [phone]  Fax: [phone]    Gastroenterology Consultation  Referring Provider:     Jearld Fenton, NP Primary Care Physician:  Jearld Fenton, NP Primary Gastroenterologist:  Dr. Cephas Darby Reason for Consultation:     Chronic GERD        HPI:   Kristin Porter is a 60 y.o. female referred by Dr. Jearld Fenton, NP  for consultation & management of chronic GERD.  Patient reports that she has been suffering from reflux for several years, previously on omeprazole.  Within last 1 month, she reports having exacerbation of her GERD symptoms with regurgitation, heartburn and difficulty swallowing hard solid foods.  She is currently on Protonix 40 mg daily.  When patient takes twice a day, she did notice improvement in her symptoms.  Patient was told that she has a hiatal hernia.  Patient never had an upper endoscopy before  She does not smoke or drink alcohol  NSAIDs: None  Antiplts/Anticoagulants/Anti thrombotics: None  GI Procedures: Colonoscopy in 2020 at [facility], normal  Past Medical History:  Diagnosis Date   Arthritis    Bilateral hips   Fibromyalgia    Hypertension    Neuropathy     Past Surgical History:  Procedure Laterality Date   BREAST BIOPSY Right 2018   benign, clip was placed    Current Outpatient Medications:    albuterol (PROVENTIL) (2.5 MG/3ML) 0.083% nebulizer solution, Take 3 mLs (2.5 mg total) by nebulization every 6 (six) hours as needed for wheezing or shortness of breath., Disp: 75 mL, Rfl: 0   albuterol (VENTOLIN HFA) 108 (90 Base) MCG/ACT inhaler, Inhale 2 puffs into the lungs every 6 (six) hours as needed for wheezing or shortness of breath., Disp: 8 g, Rfl: 0   amitriptyline (ELAVIL) 25 MG tablet, Take 12.5 mg by mouth at bedtime., Disp: , Rfl:    amLODipine (NORVASC) 10 MG tablet, Take 1 tablet (10 mg total) by mouth daily., Disp: 90  tablet, Rfl: 1   baclofen (LIORESAL) 10 MG tablet, Take 1 tablet (10 mg total) by mouth 4 (four) times daily as needed for muscle spasms., Disp: 120 each, Rfl: 5   Blood Pressure Monitoring (BLOOD PRESSURE KIT) DEVI, 1 Units by Does not apply route in the morning and at bedtime., Disp: 1 each, Rfl: 0   Evolocumab (REPATHA SURECLICK) 753 MG/ML SOAJ, Inject 1 pen into the skin every 14 (fourteen) days., Disp: 2 mL, Rfl: 11   fluticasone (FLONASE) 50 MCG/ACT nasal spray, Place 1 spray into both nostrils 2 (two) times daily., Disp: 9.9 mL, Rfl: 1   HYDROcodone-acetaminophen (NORCO) 10-325 MG tablet, Take 1 tablet by mouth every 12 (twelve) hours as needed for severe pain. Must last 30 days., Disp: 60 tablet, Rfl: 0   ipratropium (ATROVENT) 0.06 % nasal spray, Place 2 sprays into both nostrils 4 (four) times daily., Disp: 15 mL, Rfl: 12   meloxicam (MOBIC) 15 MG tablet, Take 1 tablet (15 mg total) by mouth daily as needed for pain., Disp: 30 tablet, Rfl: 5   ondansetron (ZOFRAN ODT) 4 MG disintegrating tablet, Take 1 tablet (4 mg total) by mouth every 8 (eight) hours as needed for nausea or vomiting., Disp: 15 tablet, Rfl: 0   pantoprazole (PROTONIX) 40 MG tablet, Take 1 tablet (40 mg total) by mouth 2 (two) times daily., Disp: 180 tablet, Rfl: 1  PARoxetine (PAXIL) 10 MG tablet, Take 1 tablet (10 mg total) by mouth daily., Disp: 90 tablet, Rfl: 0   pregabalin (LYRICA) 75 MG capsule, Take 2 capsules (150 mg total) by mouth at bedtime., Disp: 60 capsule, Rfl: 2   promethazine-dextromethorphan (PROMETHAZINE-DM) 6.25-15 MG/5ML syrup, Take 5 mLs by mouth 4 (four) times daily as needed., Disp: 118 mL, Rfl: 0   triamcinolone cream (KENALOG) 0.1 %, Apply 1 application topically 2 (two) times daily., Disp: 30 g, Rfl: 0    Family History  Family history unknown: Yes     Social History   Tobacco Use   Smoking status: Former    Types: Cigarettes    Quit date: 1970    Years since quitting: 52.7    Smokeless tobacco: Never  Vaping Use   Vaping Use: Never used  Substance Use Topics   Alcohol use: Not Currently   Drug use: Never    Allergies as of 02/17/2021 - Review Complete 02/17/2021  Allergen Reaction Noted   Tizanidine Other (See Comments) 07/08/2017   Lisinopril Cough 06/23/2013   Tramadol Itching 12/18/2016    Review of Systems:    All systems reviewed and negative except where noted in HPI.   Physical Exam:  BP 125/82 (BP Location: Left Arm, Patient Position: Sitting, Cuff Size: Large)   Pulse 79   Temp 97.6 F (36.4 C) (Temporal)   Ht _0  (1.499 m)   Wt 204 lb 3.2 oz (92.6 kg)   BMI 41.24 kg/m  No LMP recorded. Patient is postmenopausal.  General:   Alert,  Well-developed, well-nourished, pleasant and cooperative in NAD Head:  Normocephalic and atraumatic. Eyes:  Sclera clear, no icterus.   Conjunctiva pink. Ears:  Normal auditory acuity. Nose:  No deformity, discharge, or lesions. Mouth:  No deformity or lesions,oropharynx pink & moist. Neck:  Supple; no masses or thyromegaly. Lungs:  Respirations even and unlabored.  Clear throughout to auscultation.   No wheezes, crackles, or rhonchi. No acute distress. Heart:  Regular rate and rhythm; no murmurs, clicks, rubs, or gallops. Abdomen:  Normal bowel sounds. Soft, obese, non-tender and non-distended without masses, hepatosplenomegaly or hernias noted.  No guarding or rebound tenderness.   Rectal: Not performed Msk:  Symmetrical without gross deformities. Good, equal movement & strength bilaterally. Pulses:  Normal pulses noted. Extremities:  No clubbing or edema.  No cyanosis. Neurologic:  Alert and oriented x3;  grossly normal neurologically. Skin:  Intact without significant lesions or rashes. No jaundice. Psych:  Alert and cooperative. Normal mood and affect.  Imaging Studies: No abdominal imaging  Assessment and Plan:   Kristin Porter is a 60 y.o. pleasant African-American female with  obesity, hyperlipidemia is seen in consultation for chronic GERD with recent exacerbation and probable dysphagia  Recommend EGD for further evaluation Increase Protonix to 40 mg p.o. twice daily before meals Discussed about antireflux lifestyle, information provided   Follow up after the EGD   Cephas Darby, MD

## 2021-02-17 NOTE — Patient Instructions (Signed)
Gastroesophageal Reflux Disease, Adult Gastroesophageal reflux (GER) happens when acid from the stomach flows up into the tube that connects the mouth and the stomach (esophagus). Normally, food travels down the esophagus and stays in the stomach to be digested. With GER, food and stomach acid sometimes move back up into the esophagus. You may have a disease called gastroesophageal reflux disease (GERD) if the reflux: Happens often. Causes frequent or very bad symptoms. Causes problems such as damage to the esophagus. When this happens, the esophagus becomes sore and swollen. Over time, GERD can make small holes (ulcers) in the lining of the esophagus. What are the causes? This condition is caused by a problem with the muscle between the esophagus and the stomach. When this muscle is weak or not normal, it does not close properly to keep food and acid from coming back up from the stomach. The muscle can be weak because of: Tobacco use. Pregnancy. Having a certain type of hernia (hiatal hernia). Alcohol use. Certain foods and drinks, such as coffee, chocolate, onions, and peppermint. What increases the risk? Being overweight. Having a disease that affects your connective tissue. Taking NSAIDs, such a ibuprofen. What are the signs or symptoms? Heartburn. Difficult or painful swallowing. The feeling of having a lump in the throat. A bitter taste in the mouth. Bad breath. Having a lot of saliva. Having an upset or bloated stomach. Burping. Chest pain. Different conditions can cause chest pain. Make sure you see your doctor if you have chest pain. Shortness of breath or wheezing. A long-term cough or a cough at night. Wearing away of the surface of teeth (tooth enamel). Weight loss. How is this treated? Making changes to your diet. Taking medicine. Having surgery. Treatment will depend on how bad your symptoms are. Follow these instructions at home: Eating and drinking  Follow a  diet as told by your doctor. You may need to avoid foods and drinks such as: Coffee and tea, with or without caffeine. Drinks that contain alcohol. Energy drinks and sports drinks. Bubbly (carbonated) drinks or sodas. Chocolate and cocoa. Peppermint and mint flavorings. Garlic and onions. Horseradish. Spicy and acidic foods. These include peppers, chili powder, curry powder, vinegar, hot sauces, and BBQ sauce. Citrus fruit juices and citrus fruits, such as oranges, lemons, and limes. Tomato-based foods. These include red sauce, chili, salsa, and pizza with red sauce. Fried and fatty foods. These include donuts, french fries, potato chips, and high-fat dressings. High-fat meats. These include hot dogs, rib eye steak, sausage, ham, and bacon. High-fat dairy items, such as whole milk, butter, and cream cheese. Eat small meals often. Avoid eating large meals. Avoid drinking large amounts of liquid with your meals. Avoid eating meals during the 2-3 hours before bedtime. Avoid lying down right after you eat. Do not exercise right after you eat. Lifestyle  Do not smoke or use any products that contain nicotine or tobacco. If you need help quitting, ask your doctor. Try to lower your stress. If you need help doing this, ask your doctor. If you are overweight, lose an amount of weight that is healthy for you. Ask your doctor about a safe weight loss goal. General instructions Pay attention to any changes in your symptoms. Take over-the-counter and prescription medicines only as told by your doctor. Do not take aspirin, ibuprofen, or other NSAIDs unless your doctor says it is okay. Wear loose clothes. Do not wear anything tight around your waist. Raise (elevate) the head of your bed about 6  inches (15 cm). You may need to use a wedge to do this. Avoid bending over if this makes your symptoms worse. Keep all follow-up visits. Contact a doctor if: You have new symptoms. You lose weight and you  do not know why. You have trouble swallowing or it hurts to swallow. You have wheezing or a cough that keeps happening. You have a hoarse voice. Your symptoms do not get better with treatment. Get help right away if: You have sudden pain in your arms, neck, jaw, teeth, or back. You suddenly feel sweaty, dizzy, or light-headed. You have chest pain or shortness of breath. You vomit and the vomit is green, yellow, or black, or it looks like blood or coffee grounds. You faint. Your poop (stool) is red, bloody, or black. You cannot swallow, drink, or eat. These symptoms may represent a serious problem that is an emergency. Do not wait to see if the symptoms will go away. Get medical help right away. Call your local emergency services (911 in the [place] Do not drive yourself to the hospital. Summary If a person has gastroesophageal reflux disease (GERD), food and stomach acid move back up into the esophagus and cause symptoms or problems such as damage to the esophagus. Treatment will depend on how bad your symptoms are. Follow a diet as told by your doctor. Take all medicines only as told by your doctor. This information is not intended to replace advice given to you by your health care provider. Make sure you discuss any questions you have with your health care provider. Document Revised: 11/16/2019 Document Reviewed: 11/16/2019 Elsevier Patient Education  [place].

## 2021-02-22 ENCOUNTER — Encounter
Admission: RE | Disposition: A | Discharge status: Home / Self Care | Payer: Self-pay | Source: Home / Self Care | Attending: Gastroenterology

## 2021-02-22 ENCOUNTER — Encounter: Payer: Self-pay | Admitting: Gastroenterology

## 2021-02-22 ENCOUNTER — Ambulatory Visit: Payer: Medicare Other | Admitting: Anesthesiology

## 2021-02-22 ENCOUNTER — Ambulatory Visit
Admission: RE | Admit: 2021-02-22 | Discharge: 2021-02-22 | Disposition: A | Discharge status: Home / Self Care | Payer: Medicare Other | Attending: Gastroenterology | Admitting: Gastroenterology

## 2021-02-22 DIAGNOSIS — K3189 Other diseases of stomach and duodenum: Secondary | ICD-10-CM | POA: Insufficient documentation

## 2021-02-22 DIAGNOSIS — Z888 Allergy status to other drugs, medicaments and biological substances status: Secondary | ICD-10-CM | POA: Diagnosis not present

## 2021-02-22 DIAGNOSIS — Z13810 Encounter for screening for upper gastrointestinal disorder: Secondary | ICD-10-CM | POA: Diagnosis not present

## 2021-02-22 DIAGNOSIS — R1319 Other dysphagia: Secondary | ICD-10-CM | POA: Diagnosis not present

## 2021-02-22 DIAGNOSIS — L539 Erythematous condition, unspecified: Secondary | ICD-10-CM | POA: Diagnosis not present

## 2021-02-22 DIAGNOSIS — K2950 Unspecified chronic gastritis without bleeding: Secondary | ICD-10-CM | POA: Diagnosis not present

## 2021-02-22 DIAGNOSIS — Z87891 Personal history of nicotine dependence: Secondary | ICD-10-CM | POA: Insufficient documentation

## 2021-02-22 DIAGNOSIS — Z79899 Other long term (current) drug therapy: Secondary | ICD-10-CM | POA: Insufficient documentation

## 2021-02-22 DIAGNOSIS — K2100 Gastro-esophageal reflux disease with esophagitis, without bleeding: Secondary | ICD-10-CM | POA: Diagnosis not present

## 2021-02-22 DIAGNOSIS — B9681 Helicobacter pylori [H. pylori] as the cause of diseases classified elsewhere: Secondary | ICD-10-CM | POA: Insufficient documentation

## 2021-02-22 DIAGNOSIS — K219 Gastro-esophageal reflux disease without esophagitis: Secondary | ICD-10-CM

## 2021-02-22 HISTORY — PX: ESOPHAGOGASTRODUODENOSCOPY (EGD) WITH PROPOFOL: SHX5813

## 2021-02-22 LAB — GLUCOSE, CAPILLARY: Glucose-Capillary: 102 mg/dL — ABNORMAL HIGH (ref 70–99)

## 2021-02-22 SURGERY — COLONOSCOPY WITH PROPOFOL
Anesthesia: General

## 2021-02-22 SURGERY — ESOPHAGOGASTRODUODENOSCOPY (EGD) WITH PROPOFOL
Anesthesia: General

## 2021-02-22 MED ORDER — PROPOFOL 500 MG/50ML IV EMUL
INTRAVENOUS | Status: DC | PRN
  Administered 2021-02-22: 50 ug/kg/min via INTRAVENOUS

## 2021-02-22 MED ORDER — PROPOFOL 500 MG/50ML IV EMUL
INTRAVENOUS | Status: AC
  Filled 2021-02-22: qty 50

## 2021-02-22 MED ORDER — MIDAZOLAM HCL 2 MG/2ML IJ SOLN
INTRAMUSCULAR | Status: DC | PRN
  Administered 2021-02-22: 2 mg via INTRAVENOUS

## 2021-02-22 MED ORDER — FENTANYL CITRATE (PF) 100 MCG/2ML IJ SOLN
INTRAMUSCULAR | Status: AC
  Filled 2021-02-22: qty 2

## 2021-02-22 MED ORDER — LIDOCAINE HCL (CARDIAC) PF 100 MG/5ML IV SOSY
PREFILLED_SYRINGE | INTRAVENOUS | Status: DC | PRN
  Administered 2021-02-22: 50 mg via INTRAVENOUS

## 2021-02-22 MED ORDER — PROPOFOL 10 MG/ML IV BOLUS
INTRAVENOUS | Status: DC | PRN
  Administered 2021-02-22: 50 mg via INTRAVENOUS

## 2021-02-22 MED ORDER — MIDAZOLAM HCL 2 MG/2ML IJ SOLN
INTRAMUSCULAR | Status: AC
  Filled 2021-02-22: qty 2

## 2021-02-22 MED ORDER — SODIUM CHLORIDE 0.9 % IV SOLN: INTRAVENOUS | Status: DC

## 2021-02-22 MED ORDER — FENTANYL CITRATE (PF) 100 MCG/2ML IJ SOLN
INTRAMUSCULAR | Status: DC | PRN
  Administered 2021-02-22 (×2): 50 ug via INTRAVENOUS

## 2021-02-22 NOTE — Op Note (Signed)
[facility] Gastroenterology Patient Name: Kristin Porter Procedure Date: 02/22/2021 12:44 PM MRN: [id number] Account #: [id number] Date of Birth: 1961-05-21 Admit Type: Outpatient Age: 60 Room: [facility] ENDO ROOM 4 Gender: Female Note Status: Finalized Instrument Name: Upper Endoscope [phone] Procedure:             Upper GI endoscopy Indications:           Screening for Barrett's esophagus, Screening for                         Barrett's esophagus in patient at risk for this                         condition, Esophageal reflux symptoms that persist                         despite appropriate therapy Providers:             Lin Landsman MD, MD Referring MD:          Jearld Fenton (Referring MD) Medicines:             General Anesthesia Complications:         No immediate complications. Estimated blood loss: None. Procedure:             Pre-Anesthesia Assessment:                        - Prior to the procedure, a History and Physical was                         performed, and patient medications and allergies were                         reviewed. The patient is competent. The risks and                         benefits of the procedure and the sedation options and                         risks were discussed with the patient. All questions                         were answered and informed consent was obtained.                         Patient identification and proposed procedure were                         verified by the physician, the nurse, the                         anesthesiologist, the anesthetist and the technician                         in the pre-procedure area in the procedure room in the                         endoscopy suite. Mental Status Examination: alert and  oriented. Airway Examination: normal oropharyngeal                         airway and neck mobility. Respiratory Examination:                         clear to  auscultation. CV Examination: normal.                         Prophylactic Antibiotics: The patient does not require                         prophylactic antibiotics. Prior Anticoagulants: The                         patient has taken no previous anticoagulant or                         antiplatelet agents. ASA Grade Assessment: II - A                         patient with mild systemic disease. After reviewing                         the risks and benefits, the patient was deemed in                         satisfactory condition to undergo the procedure. The                         anesthesia plan was to use general anesthesia.                         Immediately prior to administration of medications,                         the patient was re-assessed for adequacy to receive                         sedatives. The heart rate, respiratory rate, oxygen                         saturations, blood pressure, adequacy of pulmonary                         ventilation, and response to care were monitored                         throughout the procedure. The physical status of the                         patient was re-assessed after the procedure.                        After obtaining informed consent, the endoscope was                         passed under direct vision. Throughout the procedure,  the patient's blood pressure, pulse, and oxygen                         saturations were monitored continuously. The Endoscope                         was introduced through the mouth, and advanced to the                         second part of duodenum. The upper GI endoscopy was                         accomplished without difficulty. The patient tolerated                         the procedure well. Findings:      The duodenal bulb and second portion of the duodenum were normal.      Localized mildly erythematous mucosa without bleeding was found in the       gastric body.  Biopsies were taken with a cold forceps for Helicobacter       pylori testing.      The gastric fundus, incisura and gastric antrum were normal. Biopsies       were taken with a cold forceps for Helicobacter pylori testing.      The cardia and gastric fundus were normal on retroflexion.      Esophagogastric landmarks were identified: the gastroesophageal junction       was found at 36 cm from the incisors.      The gastroesophageal junction and examined esophagus were normal. Impression:            - Normal duodenal bulb and second portion of the                         duodenum.                        - Erythematous mucosa in the gastric body. Biopsied.                        - Normal gastric fundus, incisura and antrum. Biopsied.                        - Esophagogastric landmarks identified.                        - Normal gastroesophageal junction and esophagus. Recommendation:        - Discharge patient to home (with escort).                        - Resume previous diet today.                        - Continue present medications.                        - Follow an antireflux regimen.                        - Await pathology results. Procedure Code(s):     --- Professional ---  35597, Esophagogastroduodenoscopy, flexible,                         transoral; with biopsy, single or multiple Diagnosis Code(s):     --- Professional ---                        Z13.810, Encounter for screening for upper                         gastrointestinal disorder                        K21.9, Gastro-esophageal reflux disease without                         esophagitis                        K31.89, Other diseases of stomach and duodenum CPT copyright 2019 American Medical Association. All rights reserved. The codes documented in this report are preliminary and upon coder review may  be revised to meet current compliance requirements. Dr. Ulyess Mort Lin Landsman MD,  MD 02/22/2021 12:58:34 PM This report has been signed electronically. Number of Addenda: 0 Note Initiated On: 02/22/2021 12:44 PM Estimated Blood Loss:  Estimated blood loss: none.      [facility]

## 2021-02-22 NOTE — Anesthesia Postprocedure Evaluation (Signed)
Anesthesia Post Note  Patient: Seren O Seif  Procedure(s) Performed: ESOPHAGOGASTRODUODENOSCOPY (EGD) WITH PROPOFOL  Patient location during evaluation: Endoscopy Anesthesia Type: General Level of consciousness: awake and alert and oriented Pain management: pain level controlled Vital Signs Assessment: post-procedure vital signs reviewed and stable Respiratory status: spontaneous breathing, nonlabored ventilation and respiratory function stable Cardiovascular status: blood pressure returned to baseline and stable Postop Assessment: no signs of nausea or vomiting Anesthetic complications: no   No notable events documented.   Last Vitals:  Vitals:   02/22/21 1320 02/22/21 1330  BP: 119/90 129/82  Pulse: 70 64  Resp: 15 16  Temp:    SpO2: 98% 98%    Last Pain:  Vitals:   02/22/21 1330  TempSrc:   PainSc: 0-No pain                 Staley Budzinski

## 2021-02-22 NOTE — Transfer of Care (Signed)
Immediate Anesthesia Transfer of Care Note  Patient: Kristin Porter  Procedure(s) Performed: ESOPHAGOGASTRODUODENOSCOPY (EGD) WITH PROPOFOL  Patient Location: PACU  Anesthesia Type:General  Level of Consciousness: sedated  Airway & Oxygen Therapy: Patient Spontanous Breathing and Patient connected to nasal cannula oxygen  Post-op Assessment: Report given to RN and Post -op Vital signs reviewed and stable  Post vital signs: Reviewed and stable  Last Vitals:  Vitals Value Taken Time  BP 135/96 02/22/21 1301  Temp 35.8 C 02/22/21 1301  Pulse 70 02/22/21 1304  Resp 14 02/22/21 1304  SpO2 95 % 02/22/21 1304  Vitals shown include unvalidated device data.  Last Pain:  Vitals:   02/22/21 1301  TempSrc: Temporal  PainSc: 0-No pain         Complications: No notable events documented.

## 2021-02-22 NOTE — H&P (Signed)
Kristin Darby, MD [address]  [place]  [place], [place] [address]  Main: [phone]  Fax: [phone] Pager: [phone]  Primary Care Physician:  Jearld Fenton, NP Primary Gastroenterologist:  Dr. Cephas Porter  Pre-Procedure History & Physical: HPI:  Kristin Porter is a 60 y.o. female is here for an endoscopy.   Past Medical History:  Diagnosis Date   Arthritis    Bilateral hips   Fibromyalgia    Hypertension    Neuropathy     Past Surgical History:  Procedure Laterality Date   BREAST BIOPSY Right 2018   benign, clip was placed    Prior to Admission medications   Medication Sig Start Date End Date Taking? Authorizing Provider  albuterol (VENTOLIN HFA) 108 (90 Base) MCG/ACT inhaler Inhale 2 puffs into the lungs every 6 (six) hours as needed for wheezing or shortness of breath. 01/03/21  Yes Baity, Coralie Keens, NP  amitriptyline (ELAVIL) 25 MG tablet Take 12.5 mg by mouth at bedtime. 09/20/20  Yes [provider]  amLODipine (NORVASC) 10 MG tablet Take 1 tablet (10 mg total) by mouth daily. 08/26/20 08/26/21 Yes Karamalegos, Devonne Doughty, DO  HYDROcodone-acetaminophen (NORCO) 10-325 MG tablet Take 1 tablet by mouth every 12 (twelve) hours as needed for severe pain. Must last 30 days. 02/04/21 03/06/21 Yes Gillis Santa, MD  pantoprazole (PROTONIX) 40 MG tablet Take 1 tablet (40 mg total) by mouth 2 (two) times daily. 12/12/20  Yes Jearld Fenton, NP  PARoxetine (PAXIL) 10 MG tablet Take 1 tablet (10 mg total) by mouth daily. 08/26/20  Yes Karamalegos, Devonne Doughty, DO  pregabalin (LYRICA) 75 MG capsule Take 2 capsules (150 mg total) by mouth at bedtime. 01/03/21  Yes Gillis Santa, MD  albuterol (PROVENTIL) (2.5 MG/3ML) 0.083% nebulizer solution Take 3 mLs (2.5 mg total) by nebulization every 6 (six) hours as needed for wheezing or shortness of breath. 12/12/20   Jearld Fenton, NP  baclofen (LIORESAL) 10 MG tablet Take 1 tablet (10 mg total) by mouth 4 (four)  times daily as needed for muscle spasms. 10/13/20   Gillis Santa, MD  Blood Pressure Monitoring (BLOOD PRESSURE KIT) DEVI 1 Units by Does not apply route in the morning and at bedtime. 08/20/19   Malfi, Lupita Raider, FNP  Evolocumab (REPATHA SURECLICK) 093 MG/ML SOAJ Inject 1 pen into the skin every 14 (fourteen) days. 04/06/20   Kate Sable, MD  fluticasone (FLONASE) 50 MCG/ACT nasal spray Place 1 spray into both nostrils 2 (two) times daily. 12/21/19   Malfi, Lupita Raider, FNP  ipratropium (ATROVENT) 0.06 % nasal spray Place 2 sprays into both nostrils 4 (four) times daily. 12/31/20   Margarette Canada, NP  meloxicam (MOBIC) 15 MG tablet Take 1 tablet (15 mg total) by mouth daily as needed for pain. 10/13/20   Gillis Santa, MD  ondansetron (ZOFRAN ODT) 4 MG disintegrating tablet Take 1 tablet (4 mg total) by mouth every 8 (eight) hours as needed for nausea or vomiting. 01/02/21   Lamptey, Myrene Galas, MD  promethazine-dextromethorphan (PROMETHAZINE-DM) 6.25-15 MG/5ML syrup Take 5 mLs by mouth 4 (four) times daily as needed. 12/31/20   Margarette Canada, NP  triamcinolone cream (KENALOG) 0.1 % Apply 1 application topically 2 (two) times daily. 12/12/20   Jearld Fenton, NP    Allergies as of 02/17/2021 - Review Complete 02/17/2021  Allergen Reaction Noted   Tizanidine Other (See Comments) 07/08/2017   Lisinopril Cough 06/23/2013   Tramadol Itching 12/18/2016  Family History  Family history unknown: Yes    Social History   Socioeconomic History   Marital status: Divorced    Spouse name: Not on file   Number of children: Not on file   Years of education: Not on file   Highest education level: Not on file  Occupational History   Occupation: disability   Tobacco Use   Smoking status: Former    Types: Cigarettes    Quit date: 1970    Years since quitting: 52.7   Smokeless tobacco: Never  Vaping Use   Vaping Use: Never used  Substance and Sexual Activity   Alcohol use: Not Currently   Drug use:  Never   Sexual activity: Not Currently  Other Topics Concern   Not on file  Social History Narrative   Not on file   Social Determinants of Health   Financial Resource Strain: Low Risk    Difficulty of Paying Living Expenses: Not hard at all  Food Insecurity: No Food Insecurity   Worried About Charity fundraiser in the Last Year: Never true   Arboriculturist in the Last Year: Never true  Transportation Needs: No Transportation Needs   Lack of Transportation (Medical): No   Lack of Transportation (Non-Medical): No  Physical Activity: Inactive   Days of Exercise per Week: 0 days   Minutes of Exercise per Session: 0 min  Stress: No Stress Concern Present   Feeling of Stress : Not at all  Social Connections: Not on file  Intimate Partner Violence: Not on file    Review of Systems: See HPI, otherwise negative ROS  Physical Exam: BP (!) 145/106   Pulse 68   Temp 97.6 F (36.4 C) (Temporal)   Resp 18   Ht '4\' 11"'  (1.499 m)   Wt 92.1 kg   SpO2 97%   BMI 41.00 kg/m  General:   Alert,  pleasant and cooperative in NAD Head:  Normocephalic and atraumatic. Neck:  Supple; no masses or thyromegaly. Lungs:  Clear throughout to auscultation.    Heart:  Regular rate and rhythm. Abdomen:  Soft, nontender and nondistended. Normal bowel sounds, without guarding, and without rebound.   Neurologic:  Alert and  oriented x4;  grossly normal neurologically.  Impression/Plan: [name] is here for an endoscopy to be performed for chronic GERD  Risks, benefits, limitations, and alternatives regarding  endoscopy have been reviewed with the patient.  Questions have been answered.  All parties agreeable.   Sherri Sear, MD  02/22/2021, 11:57 AM

## 2021-02-22 NOTE — Anesthesia Preprocedure Evaluation (Signed)
Anesthesia Evaluation  Patient identified by MRN, date of birth, ID band Patient awake    Reviewed: Allergy & Precautions, NPO status , Patient's Chart, lab work & pertinent test results  History of Anesthesia Complications Negative for: history of anesthetic complications  Airway Mallampati: III  TM Distance: >3 FB Neck ROM: Full    Dental no notable dental hx.    Pulmonary neg sleep apnea, neg COPD, former smoker,    breath sounds clear to auscultation- rhonchi (-) wheezing      Cardiovascular Exercise Tolerance: Good hypertension, Pt. on medications (-) CAD, (-) Past MI, (-) Cardiac Stents and (-) CABG  Rhythm:Regular Rate:Normal - Systolic murmurs and - Diastolic murmurs    Neuro/Psych neg Seizures negative neurological ROS  negative psych ROS   GI/Hepatic Neg liver ROS, GERD  ,  Endo/Other  negative endocrine ROSneg diabetes  Renal/GU negative Renal ROS     Musculoskeletal  (+) Arthritis , Fibromyalgia -  Abdominal (+) + obese,   Peds  Hematology negative hematology ROS (+)   Anesthesia Other Findings Past Medical History: No date: Arthritis     Comment:  Bilateral hips No date: Fibromyalgia No date: Hypertension No date: Neuropathy   Reproductive/Obstetrics                             Anesthesia Physical Anesthesia Plan  ASA: 2  Anesthesia Plan: General   Post-op Pain Management:    Induction: Intravenous  PONV Risk Score and Plan: 2 and Propofol infusion  Airway Management Planned: Natural Airway  Additional Equipment:   Intra-op Plan:   Post-operative Plan:   Informed Consent: I have reviewed the patients History and Physical, chart, labs and discussed the procedure including the risks, benefits and alternatives for the proposed anesthesia with the patient or authorized representative who has indicated his/her understanding and acceptance.     Dental  advisory given  Plan Discussed with: CRNA and Anesthesiologist  Anesthesia Plan Comments:         Anesthesia Quick Evaluation

## 2021-02-23 ENCOUNTER — Encounter: Payer: Self-pay | Admitting: Internal Medicine

## 2021-02-23 ENCOUNTER — Other Ambulatory Visit: Payer: Self-pay

## 2021-02-23 ENCOUNTER — Telehealth: Payer: Self-pay

## 2021-02-23 DIAGNOSIS — Z8619 Personal history of other infectious and parasitic diseases: Secondary | ICD-10-CM

## 2021-02-23 LAB — SURGICAL PATHOLOGY

## 2021-02-23 MED ORDER — OMEPRAZOLE 20 MG PO CPDR: 20.0000 mg | DELAYED_RELEASE_CAPSULE | Freq: Two times a day (BID) | ORAL | 0 refills | Status: DC

## 2021-02-23 MED ORDER — AMOXICILLIN 500 MG PO TABS: 1000.0000 mg | ORAL_TABLET | Freq: Two times a day (BID) | ORAL | 0 refills | Status: AC

## 2021-02-23 MED ORDER — CLARITHROMYCIN 500 MG PO TABS: 500.0000 mg | ORAL_TABLET | Freq: Two times a day (BID) | ORAL | 0 refills | Status: AC

## 2021-02-23 NOTE — Telephone Encounter (Signed)
Sent in meds called patient she understands her results and will come in four to six weeks for her lab

## 2021-02-24 ENCOUNTER — Encounter: Payer: Self-pay | Admitting: Gastroenterology

## 2021-02-28 ENCOUNTER — Other Ambulatory Visit: Payer: Self-pay | Admitting: Internal Medicine

## 2021-02-28 DIAGNOSIS — Z1231 Encounter for screening mammogram for malignant neoplasm of breast: Secondary | ICD-10-CM

## 2021-03-02 ENCOUNTER — Other Ambulatory Visit: Payer: Self-pay

## 2021-03-02 ENCOUNTER — Ambulatory Visit (HOSPITAL_BASED_OUTPATIENT_CLINIC_OR_DEPARTMENT_OTHER): Payer: Medicare Other | Admitting: Student in an Organized Health Care Education/Training Program

## 2021-03-02 ENCOUNTER — Ambulatory Visit
Admission: RE | Admit: 2021-03-02 | Discharge: 2021-03-02 | Disposition: A | Discharge status: Home / Self Care | Payer: Medicare Other | Source: Ambulatory Visit | Attending: Internal Medicine | Admitting: Internal Medicine

## 2021-03-02 ENCOUNTER — Encounter: Payer: Self-pay | Admitting: Student in an Organized Health Care Education/Training Program

## 2021-03-02 VITALS — BP 144/94 | HR 78 | Temp 97.4°F | Resp 15 | Ht 59.0 in | Wt 203.0 lb

## 2021-03-02 DIAGNOSIS — M25552 Pain in left hip: Secondary | ICD-10-CM

## 2021-03-02 DIAGNOSIS — G950 Syringomyelia and syringobulbia: Secondary | ICD-10-CM

## 2021-03-02 DIAGNOSIS — M25551 Pain in right hip: Secondary | ICD-10-CM

## 2021-03-02 DIAGNOSIS — M7918 Myalgia, other site: Secondary | ICD-10-CM | POA: Insufficient documentation

## 2021-03-02 DIAGNOSIS — G8929 Other chronic pain: Secondary | ICD-10-CM

## 2021-03-02 DIAGNOSIS — M545 Low back pain, unspecified: Secondary | ICD-10-CM

## 2021-03-02 DIAGNOSIS — Z1231 Encounter for screening mammogram for malignant neoplasm of breast: Secondary | ICD-10-CM | POA: Diagnosis not present

## 2021-03-02 DIAGNOSIS — M797 Fibromyalgia: Secondary | ICD-10-CM | POA: Insufficient documentation

## 2021-03-02 DIAGNOSIS — G894 Chronic pain syndrome: Secondary | ICD-10-CM | POA: Insufficient documentation

## 2021-03-02 DIAGNOSIS — M47817 Spondylosis without myelopathy or radiculopathy, lumbosacral region: Secondary | ICD-10-CM

## 2021-03-02 MED ORDER — PREGABALIN 100 MG PO CAPS: 200.0000 mg | ORAL_CAPSULE | Freq: Every day | ORAL | 2 refills | Status: DC

## 2021-03-02 MED ORDER — DIAZEPAM 2 MG PO TABS: 1.0000 mg | ORAL_TABLET | Freq: Every day | ORAL | 2 refills | Status: AC | PRN

## 2021-03-02 NOTE — Progress Notes (Signed)
PROVIDER NOTE: Information contained herein reflects review and annotations entered in association with encounter. Interpretation of such information and data should be left to medically-trained personnel. Information provided to patient can be located elsewhere in the medical record under "Patient Instructions". Document created using STT-dictation technology, any transcriptional errors that may result from process are unintentional.    Patient: Kristin Porter  Service Category: E/M  Provider: Gillis Santa, MD  DOB: 1960-09-28  DOS: 03/02/2021  Specialty: Interventional Pain Management  MRN: [id number]  Setting: Ambulatory outpatient  PCP: Jearld Fenton, NP  Type: Established Patient    Referring Provider: Jearld Fenton, NP  Location: Office  Delivery: Face-to-face     HPI  Ms. Jullisa Herma Ard, a 60 y.o. year old female has Fibromyalgia; Chronic GERD; Hyperlipidemia; Hypertension; Insomnia; Prediabetes; Chronic pain syndrome; Osteopenia; Aortic atherosclerosis ([place]); Morbid obesity ([place]); Sacroiliitis ([place]); Rotator cuff impingement syndrome of left shoulder; Calcific tendinitis of right shoulder; and Esophageal dysphagia on their problem list. , is here today because of her Fibromyalgia [M79.7]. Ms. Rann primary complain today is Pain (Generalized body pain; "feel like I have bugs crawling on me") Last encounter: My last encounter with her was on 01/03/21 Pertinent problems: Ms. Willenbring has Fibromyalgia and Chronic pain syndrome on their pertinent problem list. Pain Assessment: Severity of   is reported as a 10-Worst pain ever/10. Location:    / . Onset:  . Quality:  . Timing:  . Modifying factor(s):  Marland Kitchen Vitals:  height is '4\' 11"'  (1.499 m) and weight is 203 lb (92.1 kg). Her temporal temperature is 97.4 F (36.3 C) (abnormal). Her blood pressure is 144/94 (abnormal) and her pulse is 78. Her respiration is 15 and oxygen saturation is 99%.   Reason for encounter: medication management.     No significant change in medical history She had 2 visits to the emergency department on 12/18/2020 as well as 01/09/2021.  This was for increased overall body pain related to fibromyalgia.  She states that the hydrocodone is no longer effective. She has been dealing with increased anxiety as well as muscle spasms. She would like to discontinue hydrocodone.  She is requesting Valium which has been beneficial for her in the past.   Pharmacotherapy Assessment  Analgesic: Discontinue hydrocodone, start Valium 2 mg as needed for lumbar paraspinal muscle spasm/anxiety.  Monitoring: [place] PMP: PDMP reviewed during this encounter.       Pharmacotherapy: No side-effects or adverse reactions reported. Compliance: No problems identified. Effectiveness: Clinically acceptable.  UDS:  Summary  Date Value Ref Range Status  10/13/2020 Note  Final    Comment:    ==================================================================== [employer] [facility] 13 (MW) ==================================================================== Test                             Result       Flag       Units  Drug Present and Declared for Prescription Verification   Hydrocodone                    697          EXPECTED   ng/mg creat   Hydromorphone                  365          EXPECTED   ng/mg creat   Dihydrocodeine  240          EXPECTED   ng/mg creat   Norhydrocodone                 620          EXPECTED   ng/mg creat    Sources of hydrocodone include scheduled prescription medications.    Hydromorphone, dihydrocodeine and norhydrocodone are expected    metabolites of hydrocodone. Hydromorphone and dihydrocodeine are    also available as scheduled prescription medications.  Drug Present not Declared for Prescription Verification   Oxazepam                       21           UNEXPECTED ng/mg creat   Temazepam                      15           UNEXPECTED ng/mg creat    Oxazepam and temazepam are expected  metabolites of diazepam.    Oxazepam is also an expected metabolite of other benzodiazepine    drugs, including chlordiazepoxide, prazepam, clorazepate, halazepam,    and temazepam.  Oxazepam and temazepam are available as scheduled    prescription medications.  ==================================================================== Test                      Result    Flag   Units      Ref Range   Creatinine              149              mg/dL      >=20 ==================================================================== Declared Medications:  The flagging and interpretation on this report are based on the  following declared medications.  Unexpected results may arise from  inaccuracies in the declared medications.   **Note: The testing scope of this panel includes these medications:   Hydrocodone   **Note: The testing scope of this panel does not include the  following reported medications:   Acetaminophen  Albuterol  Amlodipine  Baclofen  Evolocumab (Repatha)  Fluticasone  Hydrochlorothiazide  Meloxicam  Omeprazole  Paroxetine  Pregabalin ==================================================================== For clinical consultation, please call [phone]. ====================================================================       ROS  Constitutional: Denies any fever or chills Gastrointestinal: No reported hemesis, hematochezia, vomiting, or acute GI distress Musculoskeletal:  Thoracic and lumbar back pain Neurological: No reported episodes of acute onset apraxia, aphasia, dysarthria, agnosia, amnesia, paralysis, loss of coordination, or loss of consciousness  Medication Review  Blood Pressure Kit, Evolocumab, HYDROcodone-acetaminophen, PARoxetine, albuterol, amLODipine, amitriptyline, amoxicillin, baclofen, clarithromycin, diazepam, fluticasone, ipratropium, meloxicam, omeprazole, ondansetron, pantoprazole, pregabalin, promethazine-dextromethorphan, and  triamcinolone cream  History Review  Allergy: Ms. Berrett is allergic to tizanidine, lisinopril, and tramadol. Drug: Ms. Hewett  reports no history of drug use. Alcohol:  reports that she does not currently use alcohol. Tobacco:  reports that she quit smoking about 52 years ago. Her smoking use included cigarettes. She has never used smokeless tobacco. Social: Ms. Morash  reports that she quit smoking about 52 years ago. Her smoking use included cigarettes. She has never used smokeless tobacco. She reports that she does not currently use alcohol. She reports that she does not use drugs. Medical:  has a past medical history of Arthritis, Fibromyalgia, Hypertension, and Neuropathy. Surgical: Ms. Toomey  has a past surgical history that includes Breast biopsy (Right, 2018)  and Esophagogastroduodenoscopy (egd) with propofol (N/A, 02/22/2021). Family: Family history is unknown by patient.  Laboratory Chemistry Profile   Renal Lab Results  Component Value Date   BUN 11 12/12/2020   CREATININE 0.63 25/09/3974   BCR NOT APPLICABLE 73/41/9379   GFRAA 107 03/22/2020   GFRNONAA 92 03/22/2020     Hepatic Lab Results  Component Value Date   AST 17 12/12/2020   ALT 16 12/12/2020     Electrolytes Lab Results  Component Value Date   NA 141 12/12/2020   K 4.4 12/12/2020   CL 107 12/12/2020   CALCIUM 9.1 12/12/2020     Bone No results found for: VD25OH, VD125OH2TOT, KW4097DZ3, GD9242AS3, 25OHVITD1, 25OHVITD2, 25OHVITD3, TESTOFREE, TESTOSTERONE   Inflammation (CRP: Acute Phase) (ESR: Chronic Phase) Lab Results  Component Value Date   CRP 15.2 (H) 12/12/2020   ESRSEDRATE 34 (H) 12/12/2020       Note: Above Lab results reviewed.  Recent Imaging Review  MM 3D SCREEN BREAST BILATERAL CLINICAL DATA:  Screening.  EXAM: DIGITAL SCREENING BILATERAL MAMMOGRAM WITH TOMO AND CAD  COMPARISON:  Previous exam(s).  ACR Breast Density Category b: There are scattered areas of fibroglandular  density.  FINDINGS: There are no findings suspicious for malignancy. Images were processed with CAD.  IMPRESSION: No mammographic evidence of malignancy. A result letter of this screening mammogram will be mailed directly to the patient.  RECOMMENDATION: Screening mammogram in one year. (Code:[id number]-B-01Y)  BI-RADS CATEGORY  1: Negative.  Electronically Signed   By: Lillia Mountain M.D.   On: 02/08/2020 12:15 MM Outside Films Mammo This examination belongs to an outside facility and is stored here for  comparison purposes only.  Contact the originating outside institution for  any associated report or interpretation. MM Outside Films Mammo This examination belongs to an outside facility and is stored here for  comparison purposes only.  Contact the originating outside institution for  any associated report or interpretation. MM Outside Films Mammo This examination belongs to an outside facility and is stored here for  comparison purposes only.  Contact the originating outside institution for  any associated report or interpretation. MM Outside Films Mammo This examination belongs to an outside facility and is stored here for  comparison purposes only.  Contact the originating outside institution for  any associated report or interpretation. MM Outside Films Mammo This examination belongs to an outside facility and is stored here for  comparison purposes only.  Contact the originating outside institution for  any associated report or interpretation. Note: Reviewed        Physical Exam  General appearance: Well nourished, well developed, and well hydrated. In no apparent acute distress Mental status: Alert, oriented x 3 (person, place, & time)       Respiratory: No evidence of acute respiratory distress Eyes: PERLA Vitals: BP (!) 144/94   Pulse 78   Temp (!) 97.4 F (36.3 C) (Temporal)   Resp 15   Ht '4\' 11"'  (1.499 m)   Wt 203 lb (92.1 kg)   SpO2 99%   BMI 41.00 kg/m  BMI:  Estimated body mass index is 41 kg/m as calculated from the following:   Height as of this encounter: '4\' 11"'  (1.499 m).   Weight as of this encounter: 203 lb (92.1 kg). Ideal: Patient must be at least 60 in tall to calculate ideal body weight  Lumbar Spine Area Exam  Skin & Axial Inspection: No masses, redness, or swelling Alignment: Symmetrical Functional ROM: Pain restricted  ROM affecting both sides Stability: No instability detected Muscle Tone/Strength: Functionally intact. No obvious neuro-muscular anomalies detected. Sensory (Neurological): Musculoskeletal pain pattern   Gait & Posture Assessment  Ambulation: Unassisted Gait: Relatively normal for age and body habitus Posture: WNL    Lower Extremity Exam      Side: Right lower extremity   Side: Left lower extremity  Stability: No instability observed           Stability: No instability observed          Skin & Extremity Inspection: Skin color, temperature, and hair growth are WNL. No peripheral edema or cyanosis. No masses, redness, swelling, asymmetry, or associated skin lesions. No contractures.   Skin & Extremity Inspection: Skin color, temperature, and hair growth are WNL. No peripheral edema or cyanosis. No masses, redness, swelling, asymmetry, or associated skin lesions. No contractures.  Functional ROM: Unrestricted ROM                   Functional ROM: Unrestricted ROM                  Muscle Tone/Strength: Functionally intact. No obvious neuro-muscular anomalies detected.   Muscle Tone/Strength: Functionally intact. No obvious neuro-muscular anomalies detected.  Sensory (Neurological): Musculoskeletal pain pattern         Sensory (Neurological): Musculoskeletal pain pattern        DTR: Patellar: deferred today Achilles: deferred today Plantar: deferred today   DTR: Patellar: deferred today Achilles: deferred today Plantar: deferred today  Palpation: No palpable anomalies   Palpation: No palpable anomalies       Assessment   Status Diagnosis  Having a Flare-up Having a Flare-up Controlled 1. Fibromyalgia   2. Chronic bilateral low back pain without sciatica   3. Syrinx of spinal cord ([place])   4. Chronic pain of both hips   5. Lumbosacral spondylosis without myelopathy   6. Chronic pain syndrome   7. Myofascial pain syndrome of lumbar spine       Plan of Care   Ms. Daziah O Kempen has a current medication list which includes the following long-term medication(s): albuterol, albuterol, amlodipine, fluticasone, ipratropium, omeprazole, pantoprazole, paroxetine, and pregabalin.  Pharmacotherapy (Medications Ordered): Meds ordered this encounter  Medications   diazepam (VALIUM) 2 MG tablet    Sig: Take 0.5-1 tablets (1-2 mg total) by mouth daily as needed for anxiety. Chronic lumbar paraspinal muscle spams    Dispense:  15 tablet    Refill:  2   pregabalin (LYRICA) 100 MG capsule    Sig: Take 2 capsules (200 mg total) by mouth at bedtime.    Dispense:  60 capsule    Refill:  2    Fill one day early if pharmacy is closed on scheduled refill date. May substitute for generic if available.   Follow-up plan:   Return in about 3 months (around 06/02/2021) for Medication Management, in person.   Recent Visits Date Type Provider Dept  01/03/21 Telemedicine Gillis Santa, MD [facility]  Showing recent visits within past 90 days and meeting all other requirements Today's Visits Date Type Provider Dept  03/02/21 Office Visit Gillis Santa, MD [facility]  Showing today's visits and meeting all other requirements Future Appointments Date Type Provider Dept  05/30/21 Appointment Gillis Santa, MD [facility]  Showing future appointments within next 90 days and meeting all other requirements I discussed the assessment and treatment plan with the patient. The patient was provided an opportunity  to ask questions and all were answered. The patient agreed with  the plan and demonstrated an understanding of the instructions.  Patient advised to call back or seek an in-person evaluation if the symptoms or condition worsens.  Duration of encounter: 64mnutes.  Note by: BGillis Santa MD Date: 03/02/2021; Time: 1:48 PM

## 2021-03-02 NOTE — Progress Notes (Signed)
Nursing Pain Medication Assessment:  Safety precautions to be maintained throughout the outpatient stay will include: orient to surroundings, keep bed in low position, maintain call bell within reach at all times, provide assistance with transfer out of bed and ambulation.  Medication Inspection Compliance: Pill count conducted under aseptic conditions, in front of the patient. Neither the pills nor the bottle was removed from the patient's sight at any time. Once count was completed pills were immediately returned to the patient in their original bottle.  Medication: Hydrocodone/APAP Pill/Patch Count:  06 of 60 pills remain Pill/Patch Appearance: Markings consistent with prescribed medication Bottle Appearance: Standard pharmacy container. Clearly labeled. Filled Date: 08 / 18 / 2022 Last Medication intake:  Yesterday

## 2021-03-09 ENCOUNTER — Ambulatory Visit (INDEPENDENT_AMBULATORY_CARE_PROVIDER_SITE_OTHER): Payer: Medicare Other | Admitting: Internal Medicine

## 2021-03-09 ENCOUNTER — Encounter: Payer: Self-pay | Admitting: Internal Medicine

## 2021-03-09 ENCOUNTER — Other Ambulatory Visit: Payer: Self-pay

## 2021-03-09 VITALS — BP 132/78 | HR 72 | Temp 97.7°F | Resp 18 | Ht 59.0 in | Wt 203.0 lb

## 2021-03-09 DIAGNOSIS — F5101 Primary insomnia: Secondary | ICD-10-CM

## 2021-03-09 MED ORDER — BELSOMRA 5 MG PO TABS: 1.0000 | ORAL_TABLET | Freq: Every day | ORAL | 0 refills | Status: DC

## 2021-03-09 NOTE — Progress Notes (Signed)
Subjective:    Patient ID: Kristin Porter, female    DOB: 1960/08/20, 60 y.o.   MRN: [id number]  HPI  Pt presents to the clinic today with c/o insomnia.  She reports she has difficulty falling asleep and staying asleep.  She was on Amitriptyline for this but reports it was making her irritable.  At her last visit we decreased it to half a tablet to see if that would help with her irritability however it did not.  She has been on Ambien in the past but stopped this due to nightmares.  She failed Trazodone.  She reports she does not snore and does not nap during the day.  There is no sleep study on file.  Review of Systems     Past Medical History:  Diagnosis Date   Arthritis    Bilateral hips   Fibromyalgia    Hypertension    Neuropathy     Current Outpatient Medications  Medication Sig Dispense Refill   albuterol (PROVENTIL) (2.5 MG/3ML) 0.083% nebulizer solution Take 3 mLs (2.5 mg total) by nebulization every 6 (six) hours as needed for wheezing or shortness of breath. 75 mL 0   albuterol (VENTOLIN HFA) 108 (90 Base) MCG/ACT inhaler Inhale 2 puffs into the lungs every 6 (six) hours as needed for wheezing or shortness of breath. 8 g 0   amitriptyline (ELAVIL) 25 MG tablet Take 12.5 mg by mouth at bedtime.     amLODipine (NORVASC) 10 MG tablet Take 1 tablet (10 mg total) by mouth daily. 90 tablet 1   amoxicillin (AMOXIL) 500 MG tablet Take 2 tablets (1,000 mg total) by mouth 2 (two) times daily for 14 days. 56 tablet 0   baclofen (LIORESAL) 10 MG tablet Take 1 tablet (10 mg total) by mouth 4 (four) times daily as needed for muscle spasms. 120 each 5   Blood Pressure Monitoring (BLOOD PRESSURE KIT) DEVI 1 Units by Does not apply route in the morning and at bedtime. 1 each 0   clarithromycin (BIAXIN) 500 MG tablet Take 1 tablet (500 mg total) by mouth 2 (two) times daily for 14 days. 28 tablet 0   diazepam (VALIUM) 2 MG tablet Take 0.5-1 tablets (1-2 mg total) by mouth daily as  needed for anxiety. Chronic lumbar paraspinal muscle spams 15 tablet 2   Evolocumab (REPATHA SURECLICK) 809 MG/ML SOAJ Inject 1 pen into the skin every 14 (fourteen) days. 2 mL 11   fluticasone (FLONASE) 50 MCG/ACT nasal spray Place 1 spray into both nostrils 2 (two) times daily. 9.9 mL 1   ipratropium (ATROVENT) 0.06 % nasal spray Place 2 sprays into both nostrils 4 (four) times daily. 15 mL 12   meloxicam (MOBIC) 15 MG tablet Take 1 tablet (15 mg total) by mouth daily as needed for pain. 30 tablet 5   omeprazole (PRILOSEC) 20 MG capsule Take 1 capsule (20 mg total) by mouth 2 (two) times daily before a meal for 14 days. 28 capsule 0   ondansetron (ZOFRAN ODT) 4 MG disintegrating tablet Take 1 tablet (4 mg total) by mouth every 8 (eight) hours as needed for nausea or vomiting. 15 tablet 0   pantoprazole (PROTONIX) 40 MG tablet Take 1 tablet (40 mg total) by mouth 2 (two) times daily. 180 tablet 1   PARoxetine (PAXIL) 10 MG tablet Take 1 tablet (10 mg total) by mouth daily. 90 tablet 0   pregabalin (LYRICA) 100 MG capsule Take 2 capsules (200 mg total) by mouth at  bedtime. 60 capsule 2   promethazine-dextromethorphan (PROMETHAZINE-DM) 6.25-15 MG/5ML syrup Take 5 mLs by mouth 4 (four) times daily as needed. 118 mL 0   triamcinolone cream (KENALOG) 0.1 % Apply 1 application topically 2 (two) times daily. 30 g 0   No current facility-administered medications for this visit.    Allergies  Allergen Reactions   Tizanidine Other (See Comments)    Throat closing and trouble breathing    Lisinopril Cough   Tramadol Itching    Can take with Benadryl    Family History  Family history unknown: Yes    Social History   Socioeconomic History   Marital status: Divorced    Spouse name: Not on file   Number of children: Not on file   Years of education: Not on file   Highest education level: Not on file  Occupational History   Occupation: disability   Tobacco Use   Smoking status: Former     Types: Cigarettes    Quit date: 1970    Years since quitting: 52.8   Smokeless tobacco: Never  Vaping Use   Vaping Use: Never used  Substance and Sexual Activity   Alcohol use: Not Currently   Drug use: Never   Sexual activity: Not Currently  Other Topics Concern   Not on file  Social History Narrative   Not on file   Social Determinants of Health   Financial Resource Strain: Low Risk    Difficulty of Paying Living Expenses: Not hard at all  Food Insecurity: No Food Insecurity   Worried About Charity fundraiser in the Last Year: Never true   [place] in the Last Year: Never true  Transportation Needs: No Transportation Needs   Lack of Transportation (Medical): No   Lack of Transportation (Non-Medical): No  Physical Activity: Inactive   Days of Exercise per Week: 0 days   Minutes of Exercise per Session: 0 min  Stress: No Stress Concern Present   Feeling of Stress : Not at all  Social Connections: Not on file  Intimate Partner Violence: Not on file     Constitutional: Denies fever, malaise, fatigue, headache or abrupt weight changes.  Respiratory: Denies difficulty breathing, shortness of breath, cough or sputum production.   Cardiovascular: Denies chest pain, chest tightness, palpitations or swelling in the hands or feet.  Neurological: Pt reports insomnia. Denies dizziness, difficulty with memory, difficulty with speech or problems with balance and coordination.    No other specific complaints in a complete review of systems (except as listed in HPI above).  Objective:   Physical Exam BP 132/78 (BP Location: Right Arm, Patient Position: Sitting, Cuff Size: Large)   Pulse 72   Temp 97.7 F (36.5 C) (Temporal)   Resp 18   Ht '4\' 11"'  (1.499 m)   Wt 203 lb (92.1 kg)   SpO2 100%   BMI 41.00 kg/m   Wt Readings from Last 3 Encounters:  03/02/21 203 lb (92.1 kg)  02/22/21 203 lb (92.1 kg)  02/17/21 204 lb 3.2 oz (92.6 kg)    General: Appears her stated  age, obese, in NAD. Cardiovascular: Normal rate and rhythm. S1,S2 noted.  No murmur, rubs or gallops noted.  Pulmonary/Chest: Normal effort and positive vesicular breath sounds. No respiratory distress. No wheezes, rales or ronchi noted.  Neurological: Alert and oriented.     BMET    Component Value Date/Time   NA 141 12/12/2020 1017   K 4.4 12/12/2020 1017  CL 107 12/12/2020 1017   CO2 24 12/12/2020 1017   GLUCOSE 102 (H) 12/12/2020 1017   BUN 11 12/12/2020 1017   CREATININE 0.63 12/12/2020 1017   CALCIUM 9.1 12/12/2020 1017   GFRNONAA 92 03/22/2020 0911   GFRAA 107 03/22/2020 0911    Lipid Panel     Component Value Date/Time   CHOL 206 (H) 12/12/2020 1017   TRIG 129 12/12/2020 1017   HDL 51 12/12/2020 1017   CHOLHDL 4.0 12/12/2020 1017   VLDL 17 09/08/2020 0914   LDLCALC 131 (H) 12/12/2020 1017    CBC    Component Value Date/Time   WBC 6.6 12/12/2020 1017   RBC 4.53 12/12/2020 1017   HGB 13.5 12/12/2020 1017   HCT 39.9 12/12/2020 1017   PLT 286 12/12/2020 1017   MCV 88.1 12/12/2020 1017   MCH 29.8 12/12/2020 1017   MCHC 33.8 12/12/2020 1017   RDW 13.5 12/12/2020 1017   LYMPHSABS 2,051 03/22/2020 0911   MONOABS 0.8 12/23/2018 1153   EOSABS 73 03/22/2020 0911   BASOSABS 37 03/22/2020 0911    Hgb A1C Lab Results  Component Value Date   HGBA1C 5.7 (H) 12/12/2020            Assessment & Plan:   Webb Silversmith, NP This visit occurred during the SARS-CoV-2 public health emergency.  Safety protocols were in place, including screening questions prior to the visit, additional usage of staff PPE, and extensive cleaning of exam room while observing appropriate contact time as indicated for disinfecting solutions.

## 2021-03-09 NOTE — Patient Instructions (Signed)
Insomnia Insomnia is a sleep disorder that makes it difficult to fall asleep or stay asleep. Insomnia can cause fatigue, low energy, difficulty concentrating, moodswings, and poor performance at work or school. There are three different ways to classify insomnia: Difficulty falling asleep. Difficulty staying asleep. Waking up too early in the morning. Any type of insomnia can be long-term (chronic) or short-term (acute). Both are common. Short-term insomnia usually lasts for three months or less. Chronic insomnia occurs at least three times a week for longer than threemonths. What are the causes? Insomnia may be caused by another condition, situation, or substance, such as: Anxiety. Certain medicines. Gastroesophageal reflux disease (GERD) or other gastrointestinal conditions. Asthma or other breathing conditions. Restless legs syndrome, sleep apnea, or other sleep disorders. Chronic pain. Menopause. Stroke. Abuse of alcohol, tobacco, or illegal drugs. Mental health conditions, such as depression. Caffeine. Neurological disorders, such as Alzheimer's disease. An overactive thyroid (hyperthyroidism). Sometimes, the cause of insomnia may not be known. What increases the risk? Risk factors for insomnia include: Gender. Women are affected more often than men. Age. Insomnia is more common as you get older. Stress. Lack of exercise. Irregular work schedule or working night shifts. Traveling between different time zones. Certain medical and mental health conditions. What are the signs or symptoms? If you have insomnia, the main symptom is having trouble falling asleep or having trouble staying asleep. This may lead to other symptoms, such as: Feeling fatigued or having low energy. Feeling nervous about going to sleep. Not feeling rested in the morning. Having trouble concentrating. Feeling irritable, anxious, or depressed. How is this diagnosed? This condition may be diagnosed based  on: Your symptoms and medical history. Your health care provider may ask about: Your sleep habits. Any medical conditions you have. Your mental health. A physical exam. How is this treated? Treatment for insomnia depends on the cause. Treatment may focus on treating an underlying condition that is causing insomnia. Treatment may also include: Medicines to help you sleep. Counseling or therapy. Lifestyle adjustments to help you sleep better. Follow these instructions at home: Eating and drinking  Limit or avoid alcohol, caffeinated beverages, and cigarettes, especially close to bedtime. These can disrupt your sleep. Do not eat a large meal or eat spicy foods right before bedtime. This can lead to digestive discomfort that can make it hard for you to sleep.  Sleep habits  Keep a sleep diary to help you and your health care provider figure out what could be causing your insomnia. Write down: When you sleep. When you wake up during the night. How well you sleep. How rested you feel the next day. Any side effects of medicines you are taking. What you eat and drink. Make your bedroom a dark, comfortable place where it is easy to fall asleep. Put up shades or blackout curtains to block light from outside. Use a white noise machine to block noise. Keep the temperature cool. Limit screen use before bedtime. This includes: Watching TV. Using your smartphone, tablet, or computer. Stick to a routine that includes going to bed and waking up at the same times every day and night. This can help you fall asleep faster. Consider making a quiet activity, such as reading, part of your nighttime routine. Try to avoid taking naps during the day so that you sleep better at night. Get out of bed if you are still awake after 15 minutes of trying to sleep. Keep the lights down, but try reading or doing a quiet   activity. When you feel sleepy, go back to bed.  General instructions Take over-the-counter  and prescription medicines only as told by your health care provider. Exercise regularly, as told by your health care provider. Avoid exercise starting several hours before bedtime. Use relaxation techniques to manage stress. Ask your health care provider to suggest some techniques that may work well for you. These may include: Breathing exercises. Routines to release muscle tension. Visualizing peaceful scenes. Make sure that you drive carefully. Avoid driving if you feel very sleepy. Keep all follow-up visits as told by your health care provider. This is important. Contact a health care provider if: You are tired throughout the day. You have trouble in your daily routine due to sleepiness. You continue to have sleep problems, or your sleep problems get worse. Get help right away if: You have serious thoughts about hurting yourself or someone else. If you ever feel like you may hurt yourself or others, or have thoughts about taking your own life, get help right away. You can go to your nearest emergency department or call: Your local emergency services (911 in the [place] A suicide crisis helpline, such as the National Suicide Prevention Lifeline at 1-800-273-8255. This is open 24 hours a day. Summary Insomnia is a sleep disorder that makes it difficult to fall asleep or stay asleep. Insomnia can be long-term (chronic) or short-term (acute). Treatment for insomnia depends on the cause. Treatment may focus on treating an underlying condition that is causing insomnia. Keep a sleep diary to help you and your health care provider figure out what could be causing your insomnia. This information is not intended to replace advice given to you by your health care provider. Make sure you discuss any questions you have with your healthcare provider. Document Revised: 03/17/2020 Document Reviewed: 03/17/2020 Elsevier Patient Education  2022 [employer]  

## 2021-03-09 NOTE — Assessment & Plan Note (Signed)
Persistent Failed Amitriptyline, Ambien and Trazodone We will trial Belsomra  Update me in 1 month and let me know how this is working for you

## 2021-03-15 DIAGNOSIS — J301 Allergic rhinitis due to pollen: Secondary | ICD-10-CM | POA: Diagnosis not present

## 2021-03-15 DIAGNOSIS — R0982 Postnasal drip: Secondary | ICD-10-CM | POA: Diagnosis not present

## 2021-03-15 DIAGNOSIS — K219 Gastro-esophageal reflux disease without esophagitis: Secondary | ICD-10-CM | POA: Diagnosis not present

## 2021-05-01 ENCOUNTER — Telehealth: Payer: Self-pay | Admitting: *Deleted

## 2021-05-01 NOTE — Telephone Encounter (Signed)
Request Reference Number: [id number]. REPATHA SURE INJ 140MG /ML is approved through 05/20/2022. Your patient may now fill this prescription and it will be covered.

## 2021-05-01 NOTE — Telephone Encounter (Signed)
PT requiring PA for Repatha 140 mg/ml inj. PA has been submitted via covermymeds. Awaiting approval.  Key: [id number]

## 2021-05-04 ENCOUNTER — Other Ambulatory Visit: Payer: Self-pay | Admitting: Internal Medicine

## 2021-05-04 DIAGNOSIS — F5101 Primary insomnia: Secondary | ICD-10-CM

## 2021-05-05 NOTE — Telephone Encounter (Signed)
Requested medication (s) are due for refill today: yes  Requested medication (s) are on the active medication list: yes  Last refill:  03/10/21  Future visit scheduled: 06/15/21  Notes to clinic:  There is not a protocol for this med, please assess.  Requested Prescriptions  Pending Prescriptions Disp Refills   BELSOMRA 5 MG TABS [Pharmacy Med Name: Belsomra 5 MG Oral Tablet] 30 tablet 0    Sig: Take 1 tablet by mouth at bedtime.     Off-Protocol Failed - 05/04/2021  4:53 PM      Failed - Medication not assigned to a protocol, review manually.      Passed - Valid encounter within last 12 months    Recent Outpatient Visits           1 month ago Primary insomnia   [facility], NP   4 months ago Encounter for general adult medical examination with abnormal findings   [facility], Coralie Keens, NP   1 year ago Hypertension, unspecified type   [place], FNP   1 year ago Mixed hyperlipidemia   [place], FNP   1 year ago Elevated glucose   [facility], Lupita Raider, FNP       Future Appointments             In 1 month Baity, Coralie Keens, NP [facility], [place]   In 5 months  [facility], [facility]

## 2021-05-30 ENCOUNTER — Ambulatory Visit (HOSPITAL_BASED_OUTPATIENT_CLINIC_OR_DEPARTMENT_OTHER): Payer: Commercial Managed Care - HMO | Admitting: Student in an Organized Health Care Education/Training Program

## 2021-05-30 ENCOUNTER — Ambulatory Visit
Admission: RE | Admit: 2021-05-30 | Discharge: 2021-05-30 | Disposition: A | Discharge status: Home / Self Care | Payer: Commercial Managed Care - HMO | Source: Ambulatory Visit | Attending: Student in an Organized Health Care Education/Training Program | Admitting: Student in an Organized Health Care Education/Training Program

## 2021-05-30 ENCOUNTER — Telehealth: Payer: Self-pay

## 2021-05-30 ENCOUNTER — Encounter: Payer: Self-pay | Admitting: Student in an Organized Health Care Education/Training Program

## 2021-05-30 ENCOUNTER — Other Ambulatory Visit: Payer: Self-pay

## 2021-05-30 VITALS — BP 151/90 | HR 79 | Temp 97.1°F | Resp 15 | Ht 59.0 in | Wt 203.0 lb

## 2021-05-30 DIAGNOSIS — G8929 Other chronic pain: Secondary | ICD-10-CM

## 2021-05-30 DIAGNOSIS — M48061 Spinal stenosis, lumbar region without neurogenic claudication: Secondary | ICD-10-CM | POA: Diagnosis not present

## 2021-05-30 DIAGNOSIS — G894 Chronic pain syndrome: Secondary | ICD-10-CM | POA: Insufficient documentation

## 2021-05-30 DIAGNOSIS — G950 Syringomyelia and syringobulbia: Secondary | ICD-10-CM | POA: Insufficient documentation

## 2021-05-30 DIAGNOSIS — M5136 Other intervertebral disc degeneration, lumbar region: Secondary | ICD-10-CM | POA: Diagnosis not present

## 2021-05-30 DIAGNOSIS — M545 Low back pain, unspecified: Secondary | ICD-10-CM

## 2021-05-30 DIAGNOSIS — M47817 Spondylosis without myelopathy or radiculopathy, lumbosacral region: Secondary | ICD-10-CM

## 2021-05-30 DIAGNOSIS — Z87891 Personal history of nicotine dependence: Secondary | ICD-10-CM | POA: Diagnosis not present

## 2021-05-30 DIAGNOSIS — M7542 Impingement syndrome of left shoulder: Secondary | ICD-10-CM | POA: Insufficient documentation

## 2021-05-30 DIAGNOSIS — M25551 Pain in right hip: Secondary | ICD-10-CM | POA: Insufficient documentation

## 2021-05-30 DIAGNOSIS — M797 Fibromyalgia: Secondary | ICD-10-CM | POA: Insufficient documentation

## 2021-05-30 DIAGNOSIS — M47816 Spondylosis without myelopathy or radiculopathy, lumbar region: Secondary | ICD-10-CM | POA: Insufficient documentation

## 2021-05-30 DIAGNOSIS — M25552 Pain in left hip: Secondary | ICD-10-CM | POA: Insufficient documentation

## 2021-05-30 DIAGNOSIS — M7918 Myalgia, other site: Secondary | ICD-10-CM | POA: Diagnosis not present

## 2021-05-30 MED ORDER — PREGABALIN 100 MG PO CAPS: 200.0000 mg | ORAL_CAPSULE | Freq: Every day | ORAL | 2 refills | Status: DC

## 2021-05-30 MED ORDER — BUPRENORPHINE 5 MCG/HR TD PTWK: 1.0000 | MEDICATED_PATCH | TRANSDERMAL | 0 refills | Status: AC

## 2021-05-30 MED ORDER — BUPRENORPHINE 7.5 MCG/HR TD PTWK: 1.0000 | MEDICATED_PATCH | TRANSDERMAL | 0 refills | Status: AC

## 2021-05-30 NOTE — Patient Instructions (Signed)

## 2021-05-30 NOTE — Progress Notes (Signed)
Nursing Pain Medication Assessment:  Safety precautions to be maintained throughout the outpatient stay will include: orient to surroundings, keep bed in low position, maintain call bell within reach at all times, provide assistance with transfer out of bed and ambulation.  Medication Inspection Compliance: Pill count conducted under aseptic conditions, in front of the patient. Neither the pills nor the bottle was removed from the patient's sight at any time. Once count was completed pills were immediately returned to the patient in their original bottle.  Medication:  diazepam Pill/Patch Count:  0 of 15 pills remain Pill/Patch Appearance: Markings consistent with prescribed medication Bottle Appearance: Standard pharmacy container. Clearly labeled. Filled Date: 62 / 13 / 2022 Last Medication intake:  Day before yesterdaySafety precautions to be maintained throughout the outpatient stay will include: orient to surroundings, keep bed in low position, maintain call bell within reach at all times, provide assistance with transfer out of bed and ambulation.

## 2021-05-30 NOTE — Progress Notes (Signed)
PROVIDER NOTE: Information contained herein reflects review and annotations entered in association with encounter. Interpretation of such information and data should be left to medically-trained personnel. Information provided to patient can be located elsewhere in the medical record under "Patient Instructions". Document created using STT-dictation technology, any transcriptional errors that may result from process are unintentional.    Patient: Kristin Porter  Service Category: E/M  Provider: Gillis Santa, MD  DOB: 09/04/1960  DOS: 05/30/2021  Specialty: Interventional Pain Management  MRN: [id number]  Setting: Ambulatory outpatient  PCP: Jearld Fenton, NP  Type: Established Patient    Referring Provider: Jearld Fenton, NP  Location: Office  Delivery: Face-to-face     HPI  [name], a 61 y.Kristin. year old female, is here today because of her Fibromyalgia [M79.7]. [name] primary complain today is Back Pain (lower) Last encounter: My last encounter with her was on 03/02/2021. Pertinent problems: [name] has Chronic pain of both hips; Fibromyalgia; Myofascial pain syndrome of lumbar spine; Lumbosacral spondylosis without myelopathy; Chronic pain syndrome; Rotator cuff impingement syndrome of left shoulder; and Lumbar facet joint syndrome on their pertinent problem list. Pain Assessment: Severity of Chronic pain is reported as a 6 /10. Location: Back Lower/pain radiaties everywhere. Onset: More than a month ago. Quality: Aching, Burning, Dull, Sharp, Stabbing, Throbbing, Nagging, Shooting, Constant. Timing: Constant. Modifying factor(s): sitting now and resting help a little. Vitals:  height is _0  (1.499 m) and weight is 203 lb (92.1 kg). Her temperature is 97.1 F (36.2 C) (abnormal). Her blood pressure is 151/90 (abnormal) and her pulse is 79. Her respiration is 15 and oxygen saturation is 99%.   Reason for encounter:   [name] presents today for increased lower  back pain related to lumbar facet arthropathy and lumbar spondylosis.  She has had previous spinal injections when she was seen at [facility] with Dr. Manus Gunning.  She found these helpful. Patient was previously on hydrocodone which produced side effects of bloating and constipation.  We discussed alternatives today which could include buprenorphine transdermal patch for chronic pain management.  She has also tried tramadol in the past with limited benefit. I will have her discontinue diazepam since we will be starting buprenorphine.  I do not recommend chronic benzodiazepine therapy in the context of chronic opioid therapy.   Pharmacotherapy Assessment  Analgesic: transition to Butrans 5-->7.5 mcg/hr   Monitoring: [place] PMP: PDMP reviewed during this encounter.       Pharmacotherapy: No side-effects or adverse reactions reported. Compliance: No problems identified. Effectiveness: Clinically acceptable.  Chauncey Fischer, RN  05/30/2021 11:32 AM  Sign when Signing Visit Nursing Pain Medication Assessment:  Safety precautions to be maintained throughout the outpatient stay will include: orient to surroundings, keep bed in low position, maintain call bell within reach at all times, provide assistance with transfer out of bed and ambulation.  Medication Inspection Compliance: Pill count conducted under aseptic conditions, in front of the patient. Neither the pills nor the bottle was removed from the patient's sight at any time. Once count was completed pills were immediately returned to the patient in their original bottle.  Medication:  diazepam Pill/Patch Count:  0 of 15 pills remain Pill/Patch Appearance: Markings consistent with prescribed medication Bottle Appearance: Standard pharmacy container. Clearly labeled. Filled Date: 10 / 13 / 2022 Last Medication intake:  Day before yesterdaySafety precautions to be maintained throughout the outpatient stay will include: orient to surroundings, keep bed in low  position, maintain  call bell within reach at all times, provide assistance with transfer out of bed and ambulation.      UDS:  Summary  Date Value Ref Range Status  10/13/2020 Note  Final    Comment:    ==================================================================== [employer] Select 13 (MW) ==================================================================== Test                             Result       Flag       Units  Drug Present and Declared for Prescription Verification   Hydrocodone                    697          EXPECTED   ng/mg creat   Hydromorphone                  365          EXPECTED   ng/mg creat   Dihydrocodeine                 240          EXPECTED   ng/mg creat   Norhydrocodone                 620          EXPECTED   ng/mg creat    Sources of hydrocodone include scheduled prescription medications.    Hydromorphone, dihydrocodeine and norhydrocodone are expected    metabolites of hydrocodone. Hydromorphone and dihydrocodeine are    also available as scheduled prescription medications.  Drug Present not Declared for Prescription Verification   Oxazepam                       21           UNEXPECTED ng/mg creat   Temazepam                      15           UNEXPECTED ng/mg creat    Oxazepam and temazepam are expected metabolites of diazepam.    Oxazepam is also an expected metabolite of other benzodiazepine    drugs, including chlordiazepoxide, prazepam, clorazepate, halazepam,    and temazepam.  Oxazepam and temazepam are available as scheduled    prescription medications.  ==================================================================== Test                      Result    Flag   Units      Ref Range   Creatinine              149              mg/dL      >=20 ==================================================================== Declared Medications:  The flagging and interpretation on this report are based on the  following declared medications.  Unexpected  results may arise from  inaccuracies in the declared medications.   **Note: The testing scope of this panel includes these medications:   Hydrocodone   **Note: The testing scope of this panel does not include the  following reported medications:   Acetaminophen  Albuterol  Amlodipine  Baclofen  Evolocumab (Repatha)  Fluticasone  Hydrochlorothiazide  Meloxicam  Omeprazole  Paroxetine  Pregabalin ==================================================================== For clinical consultation, please call [phone]. ====================================================================      ROS  Constitutional: Denies any fever or chills Gastrointestinal:  No reported hemesis, hematochezia, vomiting, or acute GI distress Musculoskeletal:  Low back pain, worse with facet loading and lumbar extension Neurological: No reported episodes of acute onset apraxia, aphasia, dysarthria, agnosia, amnesia, paralysis, loss of coordination, or loss of consciousness  Medication Review  Blood Pressure Kit, Evolocumab, PARoxetine, Suvorexant, albuterol, amLODipine, baclofen, buprenorphine, diazepam, fluticasone, ipratropium, meloxicam, omeprazole, ondansetron, pregabalin, and triamcinolone cream  History Review  Allergy: [name] is allergic to tizanidine, lisinopril, and tramadol. Drug: [name]  reports no history of drug use. Alcohol:  reports that she does not currently use alcohol. Tobacco:  reports that she quit smoking about 53 years ago. Her smoking use included cigarettes. She has never used smokeless tobacco. Social: [name]  reports that she quit smoking about 53 years ago. Her smoking use included cigarettes. She has never used smokeless tobacco. She reports that she does not currently use alcohol. She reports that she does not use drugs. Medical:  has a past medical history of Arthritis, Fibromyalgia, Hypertension, and Neuropathy. Surgical: [name]  has a past  surgical history that includes Breast biopsy (Right, 2018) and Esophagogastroduodenoscopy (egd) with propofol (N/A, 02/22/2021). Family: Family history is unknown by patient.  Laboratory Chemistry Profile   Renal Lab Results  Component Value Date   BUN 11 12/12/2020   CREATININE 0.63 50/01/3817   BCR NOT APPLICABLE 29/93/7169   GFRAA 107 03/22/2020   GFRNONAA 92 03/22/2020    Hepatic Lab Results  Component Value Date   AST 17 12/12/2020   ALT 16 12/12/2020    Electrolytes Lab Results  Component Value Date   NA 141 12/12/2020   K 4.4 12/12/2020   CL 107 12/12/2020   CALCIUM 9.1 12/12/2020    Bone No results found for: VD25OH, VD125OH2TOT, CV8938BO1, BP1025EN2, 25OHVITD1, 25OHVITD2, 25OHVITD3, TESTOFREE, TESTOSTERONE  Inflammation (CRP: Acute Phase) (ESR: Chronic Phase) Lab Results  Component Value Date   CRP 15.2 (H) 12/12/2020   ESRSEDRATE 34 (H) 12/12/2020         Note: Above Lab results reviewed.  Recent Imaging Review  MM 3D SCREEN BREAST BILATERAL CLINICAL DATA:  Screening.  EXAM: DIGITAL SCREENING BILATERAL MAMMOGRAM WITH TOMOSYNTHESIS AND CAD  TECHNIQUE: Bilateral screening digital craniocaudal and mediolateral oblique mammograms were obtained. Bilateral screening digital breast tomosynthesis was performed. The images were evaluated with computer-aided detection.  COMPARISON:  Previous exam(s).  ACR Breast Density Category b: There are scattered areas of fibroglandular density.  FINDINGS: There are no findings suspicious for malignancy.  IMPRESSION: No mammographic evidence of malignancy. A result letter of this screening mammogram will be mailed directly to the patient.  RECOMMENDATION: Screening mammogram in one year. (Code:SM-B-01Y)  BI-RADS CATEGORY  1: Negative.  Electronically Signed   By: Ileana Roup M.D.   On: 03/08/2021 12:27 Note: Reviewed        Physical Exam  General appearance: Well nourished, well developed, and well  hydrated. In no apparent acute distress Mental status: Alert, oriented x 3 (person, place, & time)       Respiratory: No evidence of acute respiratory distress Eyes: PERLA Vitals: BP (!) 151/90    Pulse 79    Temp (!) 97.1 F (36.2 C)    Resp 15    Ht _0  (1.499 m)    Wt 203 lb (92.1 kg)    SpO2 99%    BMI 41.00 kg/m  BMI: Estimated body mass index is 41 kg/m as calculated from the following:   Height as of this encounter:  _0  (1.499 m).   Weight as of this encounter: 203 lb (92.1 kg). Ideal: Patient must be at least 60 in tall to calculate ideal body weight  Lumbar Spine Area Exam  Skin & Axial Inspection: No masses, redness, or swelling Alignment: Symmetrical Functional ROM: Pain restricted ROM affecting both sides Stability: No instability detected Muscle Tone/Strength: Functionally intact. No obvious neuro-muscular anomalies detected. Sensory (Neurological): Musculoskeletal pain pattern Palpation: No palpable anomalies       Provocative Tests: Hyperextension/rotation test: (+) bilaterally for facet joint pain.  Gait & Posture Assessment  Ambulation: Unassisted Gait: Relatively normal for age and body habitus Posture: WNL   Lower Extremity Exam    Side: Right lower extremity  Side: Left lower extremity  Stability: No instability observed          Stability: No instability observed          Skin & Extremity Inspection: Skin color, temperature, and hair growth are WNL. No peripheral edema or cyanosis. No masses, redness, swelling, asymmetry, or associated skin lesions. No contractures.  Skin & Extremity Inspection: Skin color, temperature, and hair growth are WNL. No peripheral edema or cyanosis. No masses, redness, swelling, asymmetry, or associated skin lesions. No contractures.  Functional ROM: Pain restricted ROM for hip joint          Functional ROM: Pain restricted ROM for hip joint          Muscle Tone/Strength: Functionally intact. No obvious neuro-muscular  anomalies detected.  Muscle Tone/Strength: Functionally intact. No obvious neuro-muscular anomalies detected.  Sensory (Neurological): Musculoskeletal pain pattern        Sensory (Neurological): Musculoskeletal pain pattern        DTR: Patellar: deferred today Achilles: deferred today Plantar: deferred today  DTR: Patellar: deferred today Achilles: deferred today Plantar: deferred today  Palpation: No palpable anomalies  Palpation: No palpable anomalies    Assessment   Status Diagnosis  Controlled Controlled Controlled 1. Fibromyalgia   2. Chronic bilateral low back pain without sciatica   3. Syrinx of spinal cord ([place])   4. Chronic pain of both hips   5. Lumbar facet joint syndrome   6. Lumbosacral spondylosis without myelopathy   7. Chronic pain syndrome   8. Myofascial pain syndrome of lumbar spine      Updated Problems: Problem  Lumbar Facet Joint Syndrome  Rotator Cuff Impingement Syndrome of Left Shoulder  Chronic Pain of Both Hips  Lumbosacral Spondylosis Without Myelopathy  Myofascial Pain Syndrome of Lumbar Spine  Syrinx of Spinal Cord (Hcc)    Plan of Care    [name] has a current medication list which includes the following long-term medication(s): albuterol, albuterol, amlodipine, fluticasone, ipratropium, paroxetine, omeprazole, and pregabalin.   Kristin Porter has a history of greater than 3 months of moderate to severe pain which is resulted in functional impairment.  The patient has tried various conservative therapeutic options such as NSAIDs, Tylenol, muscle relaxants, physical therapy which was inadequately effective.  Patient's pain is predominantly axial with physical exam findings suggestive of facet arthropathy. Lumbar facet medial branch nerve blocks were discussed with the patient.  Risks and benefits were reviewed.  Patient would like to proceed with bilateral L3, L4, L5 medial branch nerve block.   Pharmacotherapy  (Medications Ordered): Meds ordered this encounter  Medications   buprenorphine (BUTRANS) 5 MCG/HR PTWK    Sig: Place 1 patch onto the skin once a week for 28 days.    Dispense:  4 patch    Refill:  0    Chronic Pain: STOP Act (Not applicable) Fill 1 day early if closed on refill date. Avoid benzodiazepines within 8 hours of opioids   buprenorphine (BUTRANS) 7.5 MCG/HR    Sig: Place 1 patch onto the skin once a week for 28 days.    Dispense:  4 patch    Refill:  0    Chronic Pain: STOP Act (Not applicable) Fill 1 day early if closed on refill date. Avoid benzodiazepines within 8 hours of opioids   pregabalin (LYRICA) 100 MG capsule    Sig: Take 2 capsules (200 mg total) by mouth at bedtime.    Dispense:  60 capsule    Refill:  2    Fill one day early if pharmacy is closed on scheduled refill date. May substitute for generic if available.   Orders:  Orders Placed This Encounter  Procedures   LUMBAR FACET(MEDIAL BRANCH NERVE BLOCK) MBNB    Standing Status:   Future    Standing Expiration Date:   06/30/2021    Scheduling Instructions:     Procedure: Lumbar facet block (AKA.: Lumbosacral medial branch nerve block)     Side: Bilateral     Level: L3-4, L4-5,Facets ( L3, L4, L5, Medial Branch Nerves)     Sedation: PO Valium     Timeframe: ASAA    Order Specific Question:   Where will this procedure be performed?    Answer:   [facility]   DG Lumbar Spine Complete W/Bend    Patient presents with axial pain with possible radicular component. Please assist [place] in identifying specific level(s) and laterality of any additional findings such as: 1. Facet (Zygapophyseal) joint DJD (Hypertrophy, space narrowing, subchondral sclerosis, and/or osteophyte formation) 2. DDD and/or IVDD (Loss of disc height, desiccation, gas patterns, osteophytes, endplate sclerosis, or "Black disc disease") 3. Pars defects 4. Spondylolisthesis, spondylosis, and/or spondyloarthropathies (include Degree/Grade  of displacement in mm) (stability) 5. Vertebral body Fractures (acute/chronic) (state percentage of collapse) 6. Demineralization (osteopenia/osteoporotic) 7. Bone pathology 8. Foraminal narrowing  9. Surgical changes    Standing Status:   Future    Number of Occurrences:   1    Standing Expiration Date:   06/30/2021    Scheduling Instructions:     Imaging must be done as soon as possible. Inform patient that order will expire within 30 days and I will not renew it.    Order Specific Question:   Reason for Exam (SYMPTOM  OR DIAGNOSIS REQUIRED)    Answer:   Low back pain    Order Specific Question:   Preferred imaging location?    Answer:   [facility]    Order Specific Question:   Call Results- Best Contact Number?    Answer:   [phone] ([facility])    Order Specific Question:   Radiology Contrast Protocol - do NOT remove file path    Answer:   [email or web address]    Order Specific Question:   Release to patient    Answer:   Immediate   Follow-up plan:   Return in about 8 days (around 06/07/2021) for B/L L3, 4, 5 Fcts , minimal sedation (PO Valium).    Recent Visits Date Type Provider Dept  03/02/21 Office Visit Gillis Santa, MD [facility]  Showing recent visits within past 90 days and meeting all other requirements Today's Visits Date Type Provider Dept  05/30/21 Office Visit Gillis Santa, MD [facility]  Showing today's visits and meeting all other requirements Future Appointments No visits were found meeting these conditions. Showing future appointments within next 90 days and meeting all other requirements  I discussed the assessment and treatment plan with the patient. The patient was provided an opportunity to ask questions and all were answered. The patient agreed with the plan and demonstrated an understanding of the instructions.  Patient advised to call back or seek an in-person evaluation  if the symptoms or condition worsens.  Duration of encounter: 40mnutes.  Note by: BGillis Santa MD Date: 05/30/2021; Time: 12:19 PM

## 2021-05-30 NOTE — Telephone Encounter (Signed)
Valium prescription cancelled via pharmacist Estill Bamberg at [facility].

## 2021-06-15 ENCOUNTER — Ambulatory Visit (INDEPENDENT_AMBULATORY_CARE_PROVIDER_SITE_OTHER): Payer: Medicare Other | Admitting: Internal Medicine

## 2021-06-15 ENCOUNTER — Encounter: Payer: Self-pay | Admitting: Internal Medicine

## 2021-06-15 ENCOUNTER — Other Ambulatory Visit: Payer: Self-pay

## 2021-06-15 VITALS — BP 128/77 | HR 73 | Temp 97.2°F | Resp 18 | Ht 59.0 in | Wt 204.6 lb

## 2021-06-15 DIAGNOSIS — I700 Atherosclerosis of aorta: Secondary | ICD-10-CM

## 2021-06-15 DIAGNOSIS — M797 Fibromyalgia: Secondary | ICD-10-CM | POA: Diagnosis not present

## 2021-06-15 DIAGNOSIS — K219 Gastro-esophageal reflux disease without esophagitis: Secondary | ICD-10-CM | POA: Diagnosis not present

## 2021-06-15 DIAGNOSIS — M4714 Other spondylosis with myelopathy, thoracic region: Secondary | ICD-10-CM

## 2021-06-15 DIAGNOSIS — N6452 Nipple discharge: Secondary | ICD-10-CM

## 2021-06-15 DIAGNOSIS — I10 Essential (primary) hypertension: Secondary | ICD-10-CM

## 2021-06-15 DIAGNOSIS — G894 Chronic pain syndrome: Secondary | ICD-10-CM

## 2021-06-15 DIAGNOSIS — E782 Mixed hyperlipidemia: Secondary | ICD-10-CM | POA: Diagnosis not present

## 2021-06-15 DIAGNOSIS — R7303 Prediabetes: Secondary | ICD-10-CM | POA: Diagnosis not present

## 2021-06-15 DIAGNOSIS — M8588 Other specified disorders of bone density and structure, other site: Secondary | ICD-10-CM | POA: Diagnosis not present

## 2021-06-15 DIAGNOSIS — F5101 Primary insomnia: Secondary | ICD-10-CM

## 2021-06-15 DIAGNOSIS — F339 Major depressive disorder, recurrent, unspecified: Secondary | ICD-10-CM

## 2021-06-15 MED ORDER — PAROXETINE HCL 10 MG PO TABS: 10.0000 mg | ORAL_TABLET | Freq: Every day | ORAL | 1 refills | Status: DC

## 2021-06-15 MED ORDER — ASPIRIN 81 MG PO TBEC: 81.0000 mg | DELAYED_RELEASE_TABLET | Freq: Every day | ORAL | 12 refills | Status: DC

## 2021-06-15 MED ORDER — MELOXICAM 15 MG PO TABS: 15.0000 mg | ORAL_TABLET | Freq: Every day | ORAL | 1 refills | Status: DC | PRN

## 2021-06-15 NOTE — Assessment & Plan Note (Signed)
Continue Omeprazole and Tums as needed Encouraged weight loss as this can help reduce reflux symptoms

## 2021-06-15 NOTE — Assessment & Plan Note (Signed)
Controlled on Amlodipine and HCTZ Reinforced DASH diet and exercise for weight loss

## 2021-06-15 NOTE — Patient Instructions (Signed)
Osteopenia Osteopenia is a loss of thickness (density) inside the bones. Another name for osteopenia is low bone mass. Mild osteopenia is a normal part of aging. It is not a disease, and it does not cause symptoms. However, if you have osteopenia and continue to lose bone mass, you could develop a condition that causes the bones to become thin and break more easily (osteoporosis). Osteoporosis can cause you to lose some height, have back pain, and have a stooped posture. Although osteopenia is not a disease, making changes to your lifestyle and diet can help to prevent osteopenia from developing into osteoporosis. What are the causes? Osteopenia is caused by loss of calcium in the bones. Bones are constantly changing. Old bone cells are continually being replaced with new bone cells. This process builds new bone. The mineral calcium is needed to build new bone and maintain bone density. Bone density is usually highest around age 55. After that, most people's bodies cannot replace all the bone they have lost with new bone. What increases the risk? You are more likely to develop this condition if: You are older than age 64. You are a woman who went through menopause early. You have a long illness that keeps you in bed. You do not get enough exercise. You lack certain nutrients (malnutrition). You have an overactive thyroid gland (hyperthyroidism). You use products that contain nicotine or tobacco, such as cigarettes, e-cigarettes and chewing tobacco, or you drink a lot of alcohol. You are taking medicines that weaken the bones, such as steroids. What are the signs or symptoms? This condition does not cause any symptoms. You may have a slightly higher risk for bone breaks (fractures), so getting fractures more easily than normal may be an indication of osteopenia. How is this diagnosed? This condition may be diagnosed based on an X-ray exam that measures bone density (dual-energy X-ray  absorptiometry, or DEXA). This test can measure bone density in your hips, spine, and wrists. Osteopenia has no symptoms, so this condition is usually diagnosed after a routine bone density screening test is done for osteoporosis. This routine screening is usually done for: Women who are age 12 or older. Men who are age 110 or older. If you have risk factors for osteopenia, you may have the screening test at an earlier age. How is this treated? Making dietary and lifestyle changes can lower your risk for osteoporosis. If you have severe osteopenia that is close to becoming osteoporosis, this condition can be treated with medicines and dietary supplements such as calcium and vitamin D. These supplements help to rebuild bone density. Follow these instructions at home: Eating and drinking Eat a diet that is high in calcium and vitamin D. Calcium is found in dairy products, beans, salmon, and leafy green vegetables like spinach and broccoli. Look for foods that have vitamin D and calcium added to them (fortified foods), such as orange juice, cereal, and bread.  Lifestyle Do 30 minutes or more of a weight-bearing exercise every day, such as walking, jogging, or playing a sport. These types of exercises strengthen the bones. Do not use any products that contain nicotine or tobacco, such as cigarettes, e-cigarettes, and chewing tobacco. If you need help quitting, ask your health care provider. Do not drink alcohol if: Your health care provider tells you not to drink. You are pregnant, may be pregnant, or are planning to become pregnant. If you drink alcohol: Limit how much you use to: 0-1 drink a day for women. 0-2 drinks  a day for men. Be aware of how much alcohol is in your drink. In the [place] one drink equals one 12 oz bottle of beer (355 mL), one 5 oz glass of wine (148 mL), or one 1 oz glass of hard liquor (44 mL). General instructions Take over-the-counter and prescription medicines only as  told by your health care provider. These include vitamins and supplements. Take precautions at home to lower your risk of falling, such as: Keeping rooms well-lit and free of clutter, such as cords. Installing safety rails on stairs. Using rubber mats in the bathroom or other areas that are often wet or slippery. Keep all follow-up visits. This is important. Contact a health care provider if: You have not had a bone density screening for osteoporosis and you are: A woman who is age 74 or older. A man who is age 110 or older. You are a postmenopausal woman who has not had a bone density screening for osteoporosis. You are older than age 104 and you want to know if you should have bone density screening for osteoporosis. Summary Osteopenia is a loss of thickness (density) inside the bones. Another name for osteopenia is low bone mass. Osteopenia is not a disease, but it may increase your risk for a condition that causes the bones to become thin and break more easily (osteoporosis). You may be at risk for osteopenia if you are older than age 76 or if you are a woman who went through early menopause. Osteopenia does not cause any symptoms, but it can be diagnosed with a bone density screening test. Dietary and lifestyle changes are the first treatment for osteopenia. These may lower your risk for osteoporosis. This information is not intended to replace advice given to you by your health care provider. Make sure you discuss any questions you have with your health care provider. Document Revised: 10/22/2019 Document Reviewed: 10/22/2019 Elsevier Patient Education  B [employer] E.

## 2021-06-15 NOTE — Assessment & Plan Note (Signed)
Continue Meloxicam, Baclofen, Pregabalin and Butran She will continue to follow with pain management

## 2021-06-15 NOTE — Assessment & Plan Note (Signed)
Encouraged her to consume a low fat diet Continue Repatha

## 2021-06-15 NOTE — Assessment & Plan Note (Signed)
Advised her to start a MVI OTC Encouraged daily weight bearing exercise

## 2021-06-15 NOTE — Assessment & Plan Note (Signed)
Improved on Belsomra Will monitor

## 2021-06-15 NOTE — Assessment & Plan Note (Signed)
Encouraged her to consume a low carb diet and exercise for weight loss Will check A1C at annual exam

## 2021-06-15 NOTE — Assessment & Plan Note (Signed)
Encouraged diet and exercise for weight loss ?

## 2021-06-15 NOTE — Progress Notes (Signed)
° °Subjective:  ° ° Patient ID: Kristin Porter, female    DOB: 08/12/1960, 61 y.o.   MRN: [id number] ° °HPI ° °Patient presents the clinic today for follow-up of chronic conditions. ° °HTN: Her BP today is 128/77.  She is taking Amlodipine and HCTZ as prescribed.  ECG from 08/2020 reviewed. ° °GERD: Triggered by her hiatal hernia.  She has occasional breakthrough on Omeprazole for which she takes Tums with good relief of symptoms.  Upper GI from 10/22- she was treated with H Pylori. ° °Osteopenia: She is not taking any Calcium or Vitamin D OTC see.  She does not get weightbearing exercise daily.  Bone density from 09/2019 reviewed. ° °HLD with Aortic Atherosclerosis: Her last LDL was 131, triglycerides 129, 11/2020.  She is taking Repatha as prescribed.  She tries to consume a low-fat diet.  She follows with cardiology. ° °Prediabetes: Her last A1c was 5.7%, 11/2020.  She is not currently taking any oral diabetic medication.  She does not check her sugars. ° °Depression: Chronic, managed on Paroxetine.  She is not currently seeing a therapist.  She denies anxiety, SI/HI. ° °Insomnia: She reports difficulty falling asleep and staying asleep.  She was started on Belsomra at her last visit.  There is no sleep study on file. ° °Chronic Pain/Fibromyalgia: Persistent back pain. She has started having more neck pain, headaches and pressure behind her eyes. She has seen her eye doctor and they advised her it was not an eye problem. She was ENT and they advised her it was not her sinuses. This is managed on Meloxicam, Baclofen, Pregabalin and Butran.  She follows with pain management. ° °She also reports bloody discharge from her left nipple. She noticed this about 1 year ago. She reports it is intermittent. She reports occasionally she gets sharp pains in her left breast. She had her mammogram 10/22, normal. She has no family history of breast cancer. ° °Review of Systems ° °   °Past Medical History:  °Diagnosis Date  °  Arthritis   ° Bilateral hips  ° Fibromyalgia   ° Hypertension   ° Neuropathy   ° ° °Current Outpatient Medications  °Medication Sig Dispense Refill  ° albuterol (PROVENTIL) (2.5 MG/3ML) 0.083% nebulizer solution Take 3 mLs (2.5 mg total) by nebulization every 6 (six) hours as needed for wheezing or shortness of breath. 75 mL 0  ° albuterol (VENTOLIN HFA) 108 (90 Base) MCG/ACT inhaler Inhale 2 puffs into the lungs every 6 (six) hours as needed for wheezing or shortness of breath. 8 g 0  ° amLODipine (NORVASC) 10 MG tablet Take 1 tablet (10 mg total) by mouth daily. 90 tablet 1  ° baclofen (LIORESAL) 10 MG tablet Take 1 tablet (10 mg total) by mouth 4 (four) times daily as needed for muscle spasms. 120 each 5  ° BELSOMRA 5 MG TABS TAKE 1 TABLET BY MOUTH AT BEDTIME 30 tablet 0  ° Blood Pressure Monitoring (BLOOD PRESSURE KIT) DEVI 1 Units by Does not apply route in the morning and at bedtime. 1 each 0  ° buprenorphine (BUTRANS) 5 MCG/HR PTWK Place 1 patch onto the skin once a week for 28 days. 4 patch 0  ° [START ON 06/27/2021] buprenorphine (BUTRANS) 7.5 MCG/HR Place 1 patch onto the skin once a week for 28 days. 4 patch 0  ° Evolocumab (REPATHA SURECLICK) 140 MG/ML SOAJ Inject 1 pen into the skin every 14 (fourteen) days. 2 mL 11  ° fluticasone (FLONASE) 50   MCG/ACT nasal spray Place 1 spray into both nostrils 2 (two) times daily. 9.9 mL 1  ° ipratropium (ATROVENT) 0.06 % nasal spray Place 2 sprays into both nostrils 4 (four) times daily. 15 mL 12  ° meloxicam (MOBIC) 15 MG tablet Take 1 tablet (15 mg total) by mouth daily as needed for pain. 30 tablet 5  ° omeprazole (PRILOSEC) 20 MG capsule Take 1 capsule (20 mg total) by mouth 2 (two) times daily before a meal for 14 days. 28 capsule 0  ° ondansetron (ZOFRAN ODT) 4 MG disintegrating tablet Take 1 tablet (4 mg total) by mouth every 8 (eight) hours as needed for nausea or vomiting. 15 tablet 0  ° PARoxetine (PAXIL) 10 MG tablet Take 1 tablet (10 mg total) by mouth  daily. 90 tablet 0  ° pregabalin (LYRICA) 100 MG capsule Take 2 capsules (200 mg total) by mouth at bedtime. 60 capsule 2  ° triamcinolone cream (KENALOG) 0.1 % Apply 1 application topically 2 (two) times daily. 30 g 0  ° °No current facility-administered medications for this visit.  ° ° °Allergies  °Allergen Reactions  ° Tizanidine Other (See Comments)  °  Throat closing and trouble breathing   ° Lisinopril Cough  ° Tramadol Itching  °  Can take with Benadryl  ° ° °Family History  °Family history unknown: Yes  ° ° °Social History  ° °Socioeconomic History  ° Marital status: Divorced  °  Spouse name: Not on file  ° Number of children: Not on file  ° Years of education: Not on file  ° Highest education level: Not on file  °Occupational History  ° Occupation: disability   °Tobacco Use  ° Smoking status: Former  °  Types: Cigarettes  °  Quit date: 1970  °  Years since quitting: 53.1  ° Smokeless tobacco: Never  °Vaping Use  ° Vaping Use: Never used  °Substance and Sexual Activity  ° Alcohol use: Not Currently  ° Drug use: Never  ° Sexual activity: Not Currently  °Other Topics Concern  ° Not on file  °Social History Narrative  ° Not on file  ° °Social Determinants of Health  ° °Financial Resource Strain: Low Risk   ° Difficulty of Paying Living Expenses: Not hard at all  °Food Insecurity: No Food Insecurity  ° Worried About Running Out of Food in the Last Year: Never true  ° Ran Out of Food in the Last Year: Never true  °Transportation Needs: No Transportation Needs  ° Lack of Transportation (Medical): No  ° Lack of Transportation (Non-Medical): No  °Physical Activity: Inactive  ° Days of Exercise per Week: 0 days  ° Minutes of Exercise per Session: 0 min  °Stress: No Stress Concern Present  ° Feeling of Stress : Not at all  °Social Connections: Not on file  °Intimate Partner Violence: Not on file  ° ° ° °Constitutional: Denies fever, malaise, fatigue, headache or abrupt weight changes.  °HEENT: Pt reports eye pressure.  Denies eye redness, ear pain, ringing in the ears, wax buildup, runny nose, nasal congestion, bloody nose, or sore throat. °Respiratory: Denies difficulty breathing, shortness of breath, cough or sputum production.   °Cardiovascular: Denies chest pain, chest tightness, palpitations or swelling in the hands or feet.  °Gastrointestinal: Patient reports intermittent reflux, constipation.  Denies abdominal pain, bloating, constipation, diarrhea or blood in the stool.  °GU: Denies urgency, frequency, pain with urination, burning sensation, blood in urine, odor or discharge. °Musculoskeletal: Patient reports chronic   chronic muscle and joint pain.  Denies decrease in range of motion, difficulty with gait, or joint swelling.  Skin: Pt reports discharge from left breast. Denies redness, rashes, lesions or ulcercations.  Neurological: Patient reports insomnia, numbness and tingling.  Denies dizziness, difficulty with memory, difficulty with speech or problems with balance and coordination.  Psych: Patient has a history of depression.  Denies anxiety, SI/HI.  No other specific complaints in a complete review of systems (except as listed in HPI above).  Objective:   Physical Exam  BP 128/77 (BP Location: Right Arm, Patient Position: Sitting, Cuff Size: Large)    Pulse 73    Temp (!) 97.2 F (36.2 C) (Temporal)    Resp 18    Ht 4' 11" (1.499 m)    Wt 204 lb 9.6 oz (92.8 kg)    SpO2 99%    BMI 41.32 kg/m   Wt Readings from Last 3 Encounters:  05/30/21 203 lb (92.1 kg)  03/09/21 203 lb (92.1 kg)  03/02/21 203 lb (92.1 kg)    General: Appears her stated age, obese, in NAD. Skin: Warm, dry and intact.  Breast: No masses noted in the left breast. No skin changes noted. No discharge expressed from the left nipple.  HEENT: Head: normal shape and size; Eyes: sclera white and EOMs intact;  Cardiovascular: Normal rate and rhythm. S1,S2 noted.  No murmur, rubs or gallops noted. No JVD or BLE edema. No carotid bruits  noted. Pulmonary/Chest: Normal effort and positive vesicular breath sounds. No respiratory distress. No wheezes, rales or ronchi noted.  Abdomen: Soft and nontender. Normal bowel sounds.  Musculoskeletal: Gait slow and steady with use of cane. Neurological: Alert and oriented.  Psychiatric: Mood and affect normal. Behavior is normal. Judgment and thought content normal.    BMET    Component Value Date/Time   NA 141 12/12/2020 1017   K 4.4 12/12/2020 1017   CL 107 12/12/2020 1017   CO2 24 12/12/2020 1017   GLUCOSE 102 (H) 12/12/2020 1017   BUN 11 12/12/2020 1017   CREATININE 0.63 12/12/2020 1017   CALCIUM 9.1 12/12/2020 1017   GFRNONAA 92 03/22/2020 0911   GFRAA 107 03/22/2020 0911    Lipid Panel     Component Value Date/Time   CHOL 206 (H) 12/12/2020 1017   TRIG 129 12/12/2020 1017   HDL 51 12/12/2020 1017   CHOLHDL 4.0 12/12/2020 1017   VLDL 17 09/08/2020 0914   LDLCALC 131 (H) 12/12/2020 1017    CBC    Component Value Date/Time   WBC 6.6 12/12/2020 1017   RBC 4.53 12/12/2020 1017   HGB 13.5 12/12/2020 1017   HCT 39.9 12/12/2020 1017   PLT 286 12/12/2020 1017   MCV 88.1 12/12/2020 1017   MCH 29.8 12/12/2020 1017   MCHC 33.8 12/12/2020 1017   RDW 13.5 12/12/2020 1017   LYMPHSABS 2,051 03/22/2020 0911   MONOABS 0.8 12/23/2018 1153   EOSABS 73 03/22/2020 0911   BASOSABS 37 03/22/2020 0911    Hgb A1C Lab Results  Component Value Date   HGBA1C 5.7 (H) 12/12/2020           Assessment & Plan:   Discharge, Left Nipple:  Will obtain diagnostic mammogram and US of the left breast  RT in 6 months for your annual exam Webb Silversmith, NP This visit occurred during the SARS-CoV-2 public health emergency.  Safety protocols were in place, including screening questions prior to the visit, additional usage of staff PPE, and  cleaning of exam room while observing appropriate contact time as indicated for disinfecting solutions.  ° °

## 2021-06-15 NOTE — Addendum Note (Signed)
Addended by: Jearld Fenton on: 06/15/2021 01:55 PM   Modules accepted: Orders

## 2021-06-15 NOTE — Assessment & Plan Note (Signed)
Encouraged her to consume a low fat diet Continue Repatha Will have her start baby ASA daily

## 2021-06-21 ENCOUNTER — Ambulatory Visit (HOSPITAL_BASED_OUTPATIENT_CLINIC_OR_DEPARTMENT_OTHER): Payer: Medicare Other | Admitting: Student in an Organized Health Care Education/Training Program

## 2021-06-21 ENCOUNTER — Other Ambulatory Visit: Payer: Self-pay

## 2021-06-21 ENCOUNTER — Encounter: Payer: Self-pay | Admitting: Student in an Organized Health Care Education/Training Program

## 2021-06-21 ENCOUNTER — Ambulatory Visit
Admission: RE | Admit: 2021-06-21 | Discharge: 2021-06-21 | Disposition: A | Discharge status: Home / Self Care | Payer: Medicare Other | Source: Ambulatory Visit | Attending: Student in an Organized Health Care Education/Training Program | Admitting: Student in an Organized Health Care Education/Training Program

## 2021-06-21 DIAGNOSIS — M47817 Spondylosis without myelopathy or radiculopathy, lumbosacral region: Secondary | ICD-10-CM | POA: Diagnosis not present

## 2021-06-21 DIAGNOSIS — G8929 Other chronic pain: Secondary | ICD-10-CM | POA: Diagnosis not present

## 2021-06-21 DIAGNOSIS — G894 Chronic pain syndrome: Secondary | ICD-10-CM | POA: Insufficient documentation

## 2021-06-21 DIAGNOSIS — M545 Low back pain, unspecified: Secondary | ICD-10-CM

## 2021-06-21 DIAGNOSIS — M47816 Spondylosis without myelopathy or radiculopathy, lumbar region: Secondary | ICD-10-CM

## 2021-06-21 MED ORDER — ROPIVACAINE HCL 2 MG/ML IJ SOLN
9.0000 mL | Freq: Once | INTRAMUSCULAR | Status: AC
  Administered 2021-06-21: 9 mL via PERINEURAL

## 2021-06-21 MED ORDER — DEXAMETHASONE SODIUM PHOSPHATE 10 MG/ML IJ SOLN
10.0000 mg | Freq: Once | INTRAMUSCULAR | Status: AC
  Administered 2021-06-21: 10 mg
  Filled 2021-06-21: qty 1

## 2021-06-21 MED ORDER — DIAZEPAM 5 MG PO TABS
ORAL_TABLET | ORAL | Status: AC
  Filled 2021-06-21: qty 1

## 2021-06-21 MED ORDER — LIDOCAINE HCL 2 % IJ SOLN
20.0000 mL | Freq: Once | INTRAMUSCULAR | Status: AC
  Administered 2021-06-21: 400 mg
  Filled 2021-06-21: qty 20

## 2021-06-21 NOTE — Progress Notes (Signed)
PROVIDER NOTE: Interpretation of information contained herein should be left to medically-trained personnel. Specific patient instructions are provided elsewhere under "Patient Instructions" section of medical record. This document was created in part using STT-dictation technology, any transcriptional errors that may result from this process are unintentional.  Patient: Kristin Porter Type: Established DOB: 03-24-1961 MRN: [id number] PCP: Jearld Fenton, NP  Service: Procedure DOS: 06/21/2021 Setting: Ambulatory Location: Ambulatory outpatient facility Delivery: Face-to-face Provider: Gillis Santa, MD Specialty: Interventional Pain Management Specialty designation: 09 Location: Outpatient facility Ref. Prov.: Gillis Santa, MD    Primary Reason for Visit: Interventional Pain Management Treatment. CC: Back Pain   Procedure:           Type: Lumbar Facet, Medial Branch Block(s) #1  Laterality: Bilateral  Level: L3, L4, L5,  Medial Branch Level(s). Injecting these levels blocks the L3-4 and L5-S1 lumbar facet joints. Imaging: Fluoroscopic guidance Anesthesia: Local anesthesia (1-2% Lidocaine) Anxiolysis: Oral Valium 5 mg Sedation: None. DOS: 06/21/2021 Performed by: Gillis Santa, MD  Primary Purpose: Diagnostic/Therapeutic Indications: Low back pain severe enough to impact quality of life or function. 1. Chronic bilateral low back pain without sciatica   2. Lumbosacral spondylosis without myelopathy   3. Chronic pain syndrome   4. Lumbar facet joint syndrome    NAS-11 Pain score:   Pre-procedure: 8 /10   Post-procedure: 6  (walking)/10     Position / Prep / Materials:  Position: Prone  Prep solution: DuraPrep (Iodine Povacrylex [0.7% available iodine] and Isopropyl Alcohol, 74% w/w) Area Prepped: Posterolateral Lumbosacral Spine (Wide prep: From the lower border of the scapula down to the end of the tailbone and from flank to flank.)  Materials:  Tray: Block Needle(s):   Type: Spinal  Gauge (G): 25  Length: 3.5-in Qty: 2  Pre-op H&P Assessment:  Kristin Porter is a 61 y.o. (year old), female patient, seen today for interventional treatment. She  has a past surgical history that includes Breast biopsy (Right, 2018) and Esophagogastroduodenoscopy (egd) with propofol (N/A, 02/22/2021). Kristin Porter has a current medication list which includes the following prescription(s): albuterol, albuterol, amlodipine, aspirin, baclofen, belsomra, blood pressure kit, buprenorphine, [START ON 06/27/2021] buprenorphine, repatha sureclick, fluticasone, meloxicam, paroxetine, pregabalin, triamcinolone cream, and omeprazole. Her primarily concern today is the Back Pain  Initial Vital Signs:  Pulse/HCG Rate: 65  Temp: 97.7 F (36.5 C) Resp: 18 BP:  (!) 147/87 SpO2: 98 %  BMI: Estimated body mass index is 41 kg/m as calculated from the following:   Height as of this encounter: '4\' 11"'  (1.499 m).   Weight as of this encounter: 203 lb (92.1 kg).  Risk Assessment: Allergies: Reviewed. She is allergic to tizanidine, lisinopril, and tramadol.  Allergy Precautions: None required Coagulopathies: Reviewed. None identified.  Blood-thinner therapy: None at this time Active Infection(s): Reviewed. None identified. Kristin Porter is afebrile  Site Confirmation: Kristin Porter was asked to confirm the procedure and laterality before marking the site Procedure checklist: Completed Consent: Before the procedure and under the influence of no sedative(s), amnesic(s), or anxiolytics, the patient was informed of the treatment options, risks and possible complications. To fulfill our ethical and legal obligations, as recommended by the American Medical Association's Code of Ethics, I have informed the patient of my clinical impression; the nature and purpose of the treatment or procedure; the risks, benefits, and possible complications of the intervention; the alternatives, including doing nothing; the  risk(s) and benefit(s) of the alternative treatment(s) or procedure(s); and the risk(s) and benefit(s) of doing  nothing. The patient was provided information about the general risks and possible complications associated with the procedure. These may include, but are not limited to: failure to achieve desired goals, infection, bleeding, organ or nerve damage, allergic reactions, paralysis, and death. In addition, the patient was informed of those risks and complications associated to Spine-related procedures, such as failure to decrease pain; infection (i.e.: Meningitis, epidural or intraspinal abscess); bleeding (i.e.: epidural hematoma, subarachnoid hemorrhage, or any other type of intraspinal or peri-dural bleeding); organ or nerve damage (i.e.: Any type of peripheral nerve, nerve root, or spinal cord injury) with subsequent damage to sensory, motor, and/or autonomic systems, resulting in permanent pain, numbness, and/or weakness of one or several areas of the body; allergic reactions; (i.e.: anaphylactic reaction); and/or death. Furthermore, the patient was informed of those risks and complications associated with the medications. These include, but are not limited to: allergic reactions (i.e.: anaphylactic or anaphylactoid reaction(s)); adrenal axis suppression; blood sugar elevation that in diabetics may result in ketoacidosis or comma; water retention that in patients with history of congestive heart failure may result in shortness of breath, pulmonary edema, and decompensation with resultant heart failure; weight gain; swelling or edema; medication-induced neural toxicity; particulate matter embolism and blood vessel occlusion with resultant organ, and/or nervous system infarction; and/or aseptic necrosis of one or more joints. Finally, the patient was informed that Medicine is not an exact science; therefore, there is also the possibility of unforeseen or unpredictable risks and/or possible complications  that may result in a catastrophic outcome. The patient indicated having understood very clearly. We have given the patient no guarantees and we have made no promises. Enough time was given to the patient to ask questions, all of which were answered to the patient's satisfaction. Kristin Porter has indicated that she wanted to continue with the procedure. Attestation: I, the ordering provider, attest that I have discussed with the patient the benefits, risks, side-effects, alternatives, likelihood of achieving goals, and potential problems during recovery for the procedure that I have provided informed consent. Date   Time: 06/21/2021 12:44 PM  Pre-Procedure Preparation:  Monitoring: As per clinic protocol. Respiration, ETCO2, SpO2, BP, heart rate and rhythm monitor placed and checked for adequate function Safety Precautions: Patient was assessed for positional comfort and pressure points before starting the procedure. Time-out: I initiated and conducted the "Time-out" before starting the procedure, as per protocol. The patient was asked to participate by confirming the accuracy of the "Time Out" information. Verification of the correct person, site, and procedure were performed and confirmed by me, the nursing staff, and the patient. "Time-out" conducted as per Joint Commission's Universal Protocol (UP.01.01.01). Time: 1322  Description of Procedure:          Laterality: Bilateral. The procedure was performed in identical fashion on both sides. Levels:  L3, L4, L5, Medial Branch Level(s)  Safety Precautions: Aspiration looking for blood return was conducted prior to all injections. At no point did we inject any substances, as a needle was being advanced. Before injecting, the patient was told to immediately notify me if she was experiencing any new onset of "ringing in the ears, or metallic taste in the mouth". No attempts were made at seeking any paresthesias. Safe injection practices and needle disposal  techniques used. Medications properly checked for expiration dates. SDV (single dose vial) medications used. After the completion of the procedure, all disposable equipment used was discarded in the proper designated medical waste containers. Local Anesthesia: Protocol guidelines were followed. The  patient was positioned over the fluoroscopy table. The area was prepped in the usual manner. The time-out was completed. The target area was identified using fluoroscopy. A 12-in long, straight, sterile hemostat was used with fluoroscopic guidance to locate the targets for each level blocked. Once located, the skin was marked with an approved surgical skin marker. Once all sites were marked, the skin (epidermis, dermis, and hypodermis), as well as deeper tissues (fat, connective tissue and muscle) were infiltrated with a small amount of a short-acting local anesthetic, loaded on a 10cc syringe with a 25G, 1.5-in  Needle. An appropriate amount of time was allowed for local anesthetics to take effect before proceeding to the next step. Local Anesthetic: Lidocaine 2.0% The unused portion of the local anesthetic was discarded in the proper designated containers. Technical explanation of process:   L3 Medial Branch Nerve Block (MBB): The target area for the L3 medial branch is at the junction of the postero-lateral aspect of the superior articular process and the superior, posterior, and medial edge of the transverse process of L4. Under fluoroscopic guidance, a Quincke needle was inserted until contact was made with os over the superior postero-lateral aspect of the pedicular shadow (target area). After negative aspiration for blood, 101m of the nerve block solution was injected without difficulty or complication. The needle was removed intact. L4 Medial Branch Nerve Block (MBB): The target area for the L4 medial branch is at the junction of the postero-lateral aspect of the superior articular process and the superior,  posterior, and medial edge of the transverse process of L5. Under fluoroscopic guidance, a Quincke needle was inserted until contact was made with os over the superior postero-lateral aspect of the pedicular shadow (target area). After negative aspiration for blood, 260mof the nerve block solution was injected without difficulty or complication. The needle was removed intact. L5 Medial Branch Nerve Block (MBB): The target area for the L5 medial branch is at the junction of the postero-lateral aspect of the superior articular process and the superior, posterior, and medial edge of the sacral ala. Under fluoroscopic guidance, a Quincke needle was inserted until contact was made with os over the superior postero-lateral aspect of the pedicular shadow (target area). After negative aspiration for blood,[id number] the nerve block solution was injected without difficulty or complication. The needle was removed intact.  12 cc solution made of 10 cc of 0.2% ropivacaine, 2 cc of Decadron 10 mg/cc.  2 cc injected at each level above bilaterally.  Once the entire procedure was completed, the treated area was cleaned, making sure to leave some of the prepping solution back to take advantage of its long term bactericidal properties.      Illustration of the posterior view of the lumbar spine and the posterior neural structures. Laminae of L2 through S1 are labeled. DPRL5, dorsal primary ramus of L5; DPRS1, dorsal primary ramus of S1; DPR3, dorsal primary ramus of L3; FJ, facet (zygapophyseal) joint L3-L4; I, inferior articular process of L4; LB1, lateral branch of dorsal primary ramus of L1; IAB, inferior articular branches from L3 medial branch (supplies L4-L5 facet joint); IBP, intermediate branch plexus; MB3, medial branch of dorsal primary ramus of L3; NR3, third lumbar nerve root; S, superior articular process of L5; SAB, superior articular branches from L4 (supplies L4-5 facet joint also); TP3, transverse process of  L3.  Vitals:   06/21/21 1320 06/21/21 1325 06/21/21 1330 06/21/21 1335  BP: (!) 153/96 (!) 153/107 (!) 160/106 (!) 158/104  Pulse:  64 63 69 70  Resp: 18 17 (!) 21 20  Temp:      TempSrc:      SpO2: 98% 96% 97% 99%  Weight:      Height:         Start Time: 1322 hrs. End Time: 1334 hrs.  Imaging Guidance (Spinal):          Type of Imaging Technique: Fluoroscopy Guidance (Spinal) Indication(s): Assistance in needle guidance and placement for procedures requiring needle placement in or near specific anatomical locations not easily accessible without such assistance. Exposure Time: Please see nurses notes. Contrast: None used. Fluoroscopic Guidance: I was personally present during the use of fluoroscopy. "Tunnel Vision Technique" used to obtain the best possible view of the target area. Parallax error corrected before commencing the procedure. "Direction-depth-direction" technique used to introduce the needle under continuous pulsed fluoroscopy. Once target was reached, antero-posterior, oblique, and lateral fluoroscopic projection used confirm needle placement in all planes. Images permanently stored in EMR. Interpretation: No contrast injected. I personally interpreted the imaging intraoperatively. Adequate needle placement confirmed in multiple planes. Permanent images saved into the patient's record.  Post-operative Assessment:  Post-procedure Vital Signs:  Pulse/HCG Rate: 70 (NSR)  Temp: 97.7 F (36.5 C) Resp: 20 BP:  (!) 158/104 SpO2: 99 %  EBL: None  Complications: No immediate post-treatment complications observed by team, or reported by patient.  Note: The patient tolerated the entire procedure well. A repeat set of vitals were taken after the procedure and the patient was kept under observation following institutional policy, for this type of procedure. Post-procedural neurological assessment was performed, showing return to baseline, prior to discharge. The patient was  provided with post-procedure discharge instructions, including a section on how to identify potential problems. Should any problems arise concerning this procedure, the patient was given instructions to immediately contact us, at any time, without hesitation. In any case, we plan to contact the patient by telephone for a follow-up status report regarding this interventional procedure.  Comments:  No additional relevant information.  Plan of Care  Orders:  Orders Placed This Encounter  Procedures   DG PAIN [facility] C-ARM 1-60 MIN NO REPORT    Intraoperative interpretation by procedural physician at [place].    Standing Status:   Standing    Number of Occurrences:   1    Order Specific Question:   Reason for exam:    Answer:   Assistance in needle guidance and placement for procedures requiring needle placement in or near specific anatomical locations not easily accessible without such assistance.   Chronic Opioid Analgesic:  Butrans 7.5 mcg/hr   Medications ordered for procedure: Meds ordered this encounter  Medications   lidocaine (XYLOCAINE) 2 % (with pres) injection 400 mg   dexamethasone (DECADRON) injection 10 mg   dexamethasone (DECADRON) injection 10 mg   ropivacaine (PF) 2 mg/mL (0.2%) (NAROPIN) injection 9 mL   ropivacaine (PF) 2 mg/mL (0.2%) (NAROPIN) injection 9 mL   Medications administered: We administered lidocaine, dexamethasone, dexamethasone, ropivacaine (PF) 2 mg/mL (0.2%), and ropivacaine (PF) 2 mg/mL (0.2%).  See the medical record for exact dosing, route, and time of administration.  Follow-up plan:   Return in about 4 weeks (around 07/19/2021) for Post Procedure Evaluation, virtual.      Recent Visits Date Type Provider Dept  05/30/21 Office Visit Gillis Santa, MD [facility]  Showing recent visits within past 90 days and meeting all other requirements Today's Visits Date Type Provider Dept  06/21/21 Procedure visit Gillis Santa, MD  [facility]  Showing today's visits and meeting all other requirements Future Appointments Date Type Provider Dept  07/19/21 Appointment Gillis Santa, MD [facility]  Showing future appointments within next 90 days and meeting all other requirements  Disposition: Discharge home  Discharge (Date   Time): 06/21/2021; 1343 hrs.   Primary Care Physician: Jearld Fenton, NP Location: [facility] Note by: Gillis Santa, MD Date: 06/21/2021; Time: 2:28 PM  Disclaimer:  Medicine is not an exact science. The only guarantee in medicine is that nothing is guaranteed. It is important to note that the decision to proceed with this intervention was based on the information collected from the patient. The Data and conclusions were drawn from the patient's questionnaire, the interview, and the physical examination. Because the information was provided in large part by the patient, it cannot be guaranteed that it has not been purposely or unconsciously manipulated. Every effort has been made to obtain as much relevant data as possible for this evaluation. It is important to note that the conclusions that lead to this procedure are derived in large part from the available data. Always take into account that the treatment will also be dependent on availability of resources and existing treatment guidelines, considered by other Pain Management Practitioners as being common knowledge and practice, at the time of the intervention. For Medico-Legal purposes, it is also important to point out that variation in procedural techniques and pharmacological choices are the acceptable norm. The indications, contraindications, technique, and results of the above procedure should only be interpreted and judged by a Board-Certified Interventional Pain Specialist with extensive familiarity and expertise in the same exact procedure and technique.

## 2021-06-21 NOTE — Progress Notes (Signed)
Safety precautions to be maintained throughout the outpatient stay will include: orient to surroundings, keep bed in low position, maintain call bell within reach at all times, provide assistance with transfer out of bed and ambulation.  

## 2021-06-21 NOTE — Patient Instructions (Signed)

## 2021-06-22 ENCOUNTER — Telehealth: Payer: Self-pay | Admitting: *Deleted

## 2021-06-22 NOTE — Telephone Encounter (Signed)
Post procedure call; reports that she is doing well.   

## 2021-06-26 ENCOUNTER — Ambulatory Visit
Admission: RE | Admit: 2021-06-26 | Discharge: 2021-06-26 | Disposition: A | Discharge status: Home / Self Care | Payer: Medicare Other | Source: Ambulatory Visit | Attending: Internal Medicine | Admitting: Internal Medicine

## 2021-06-26 ENCOUNTER — Other Ambulatory Visit: Payer: Self-pay

## 2021-06-26 DIAGNOSIS — R928 Other abnormal and inconclusive findings on diagnostic imaging of breast: Secondary | ICD-10-CM | POA: Diagnosis not present

## 2021-06-26 DIAGNOSIS — N6452 Nipple discharge: Secondary | ICD-10-CM

## 2021-06-26 DIAGNOSIS — N6489 Other specified disorders of breast: Secondary | ICD-10-CM | POA: Diagnosis not present

## 2021-06-27 ENCOUNTER — Other Ambulatory Visit: Payer: Self-pay | Admitting: Internal Medicine

## 2021-06-27 DIAGNOSIS — N6320 Unspecified lump in the left breast, unspecified quadrant: Secondary | ICD-10-CM

## 2021-06-27 DIAGNOSIS — R928 Other abnormal and inconclusive findings on diagnostic imaging of breast: Secondary | ICD-10-CM

## 2021-07-12 ENCOUNTER — Other Ambulatory Visit: Payer: Self-pay

## 2021-07-12 ENCOUNTER — Ambulatory Visit
Admission: RE | Admit: 2021-07-12 | Discharge: 2021-07-12 | Disposition: A | Discharge status: Home / Self Care | Payer: Medicare Other | Source: Ambulatory Visit | Attending: Internal Medicine | Admitting: Internal Medicine

## 2021-07-12 DIAGNOSIS — N6325 Unspecified lump in the left breast, overlapping quadrants: Secondary | ICD-10-CM | POA: Diagnosis not present

## 2021-07-12 DIAGNOSIS — N6320 Unspecified lump in the left breast, unspecified quadrant: Secondary | ICD-10-CM

## 2021-07-12 DIAGNOSIS — R928 Other abnormal and inconclusive findings on diagnostic imaging of breast: Secondary | ICD-10-CM | POA: Insufficient documentation

## 2021-07-12 HISTORY — PX: BREAST BIOPSY: SHX20

## 2021-07-13 LAB — SURGICAL PATHOLOGY

## 2021-07-19 ENCOUNTER — Ambulatory Visit
Payer: Medicare Other | Attending: Student in an Organized Health Care Education/Training Program | Admitting: Student in an Organized Health Care Education/Training Program

## 2021-07-19 ENCOUNTER — Encounter: Payer: Self-pay | Admitting: Student in an Organized Health Care Education/Training Program

## 2021-07-19 ENCOUNTER — Other Ambulatory Visit: Payer: Self-pay

## 2021-07-19 DIAGNOSIS — G894 Chronic pain syndrome: Secondary | ICD-10-CM | POA: Diagnosis not present

## 2021-07-19 DIAGNOSIS — M47817 Spondylosis without myelopathy or radiculopathy, lumbosacral region: Secondary | ICD-10-CM | POA: Diagnosis not present

## 2021-07-19 DIAGNOSIS — M47816 Spondylosis without myelopathy or radiculopathy, lumbar region: Secondary | ICD-10-CM

## 2021-07-19 NOTE — Progress Notes (Signed)
Doing okay how you doing patient: Kristin Porter  Service Category: E/M  Provider: Gillis Santa, MD  DOB: [birth year]  DOS: 07/19/2021  Location: Office  MRN: [id number]  Setting: Ambulatory outpatient  Referring Provider: Jearld Fenton, NP  Type: Established Patient  Specialty: Interventional Pain Management  PCP: Jearld Fenton, NP  Location: Remote location  Delivery: TeleHealth     Virtual Encounter - Pain Management PROVIDER NOTE: Information contained herein reflects review and annotations entered in association with encounter. Interpretation of such information and data should be left to medically-trained personnel. Information provided to patient can be located elsewhere in the medical record under "Patient Instructions". Document created using STT-dictation technology, any transcriptional errors that may result from process are unintentional.    Contact & Pharmacy Preferred: [phone] Home: [phone] (home) Mobile: [phone] (mobile) E-mail: [email or web address] .[email or web address], [place] - [place] [address] Phone: [phone] Fax: [phone]  [id number] [id number]: [phone]: [phone]  Pre-screening  Ms. Bogle offered "in-person" vs "virtual" encounter. She indicated preferring virtual for this encounter.   Reason COVID-19*   Social distancing based on CDC and AMA recommendations.   [facility].      I clearly identified myself as BGillis Santa MD. I verified that I was speaking with the correct person using two identifiers (Name: DAFFIE GASNER and date of birth: [birth year].  Consent I sought verbal advanced consent from [place]Upper [place]for virtual visit interactions. I informed Ms. Bera of possible security and privacy concerns, risks, and limitations  associated with providing "not-in-person" medical evaluation and management services. I also informed Ms. Folsom of the availability of "in-person" appointments. Finally, I informed her that there would be a charge for the virtual visit and that she could be  personally, fully or partially, financially responsible for it. Ms. HGuseexpressed understanding and agreed to proceed.   Historic Elements   Ms. Audia OCHARI PARMENTERis a 61y.o. year old, female patient evaluated today after our last contact on 06/21/2021. Ms. HOhalloran has a past medical history of Arthritis, Fibromyalgia, Hypertension, and Neuropathy. She also  has a past surgical history that includes Breast biopsy (Right, 2018); Esophagogastroduodenoscopy (egd) with propofol (N/A, 02/22/2021); and Breast biopsy (Left, 07/12/2021). Ms. HBullardhas a current medication list which includes the following prescription(s): albuterol, albuterol, amlodipine, aspirin, baclofen, belsomra, blood pressure kit, buprenorphine, repatha sureclick, fluticasone, meloxicam, omeprazole, paroxetine, pregabalin, triamcinolone cream, and omeprazole. She  reports that she quit smoking about 53 years ago. Her smoking use included cigarettes. She has never used smokeless tobacco. She reports that she does not currently use alcohol. She reports that she does not use drugs. Ms. HTherrienis allergic to tizanidine, lisinopril, and tramadol.   HPI  Today, she is being contacted for a post-procedure assessment.   Post-procedure evaluation    Type: Lumbar Facet, Medial Branch Block(s) #1  Laterality: Bilateral  Level: L3, L4, L5,  Medial Branch Level(s). Injecting these levels blocks the L3-4 and L5-S1 lumbar facet joints. Imaging: Fluoroscopic guidance Anesthesia: Local anesthesia (1-2% Lidocaine) Anxiolysis: Oral Valium 5 mg Sedation: None. DOS: 06/21/2021 Performed by: BGillis Santa MD  Primary Purpose: Diagnostic/Therapeutic Indications: Low back pain severe  enough to impact quality of life or function. 1. Chronic bilateral low back pain without sciatica   2. Lumbosacral spondylosis without  myelopathy   3. Chronic pain syndrome   4. Lumbar facet joint syndrome    NAS-11 Pain score:   Pre-procedure: 8 /10   Post-procedure: 6  (walking)/10      Effectiveness:  Initial hour after procedure: 100 %  Subsequent 4-6 hours post-procedure: 100 %  Analgesia past initial 6 hours: 100 % (good pain relief for approx 1 week or more.)  Ongoing improvement:  Analgesic: 100% pain relief for 2 weeks and then pain gradually returned.  Currently experiencing 45 to 50% pain relief. Function: Ms. Astarita reports improvement in function ROM: Ms. Jenkin reports improvement in ROM     Laboratory Chemistry Profile   Renal Lab Results  Component Value Date   BUN 11 12/12/2020   CREATININE 0.63 32/20/2542   BCR NOT APPLICABLE 70/62/3762   GFRAA 107 03/22/2020   GFRNONAA 92 03/22/2020    Hepatic Lab Results  Component Value Date   AST 17 12/12/2020   ALT 16 12/12/2020    Electrolytes Lab Results  Component Value Date   NA 141 12/12/2020   K 4.4 12/12/2020   CL 107 12/12/2020   CALCIUM 9.1 12/12/2020    Bone No results found for: VD25OH, VD125OH2TOT, GB1517OH6, WV3710GY6, 25OHVITD1, 25OHVITD2, 25OHVITD3, TESTOFREE, TESTOSTERONE  Inflammation (CRP: Acute Phase) (ESR: Chronic Phase) Lab Results  Component Value Date   CRP 15.2 (H) 12/12/2020   ESRSEDRATE 34 (H) 12/12/2020         Note: Above Lab results reviewed.  Imaging  [place] LT BREAST BX W LOC DEV 1ST LESION IMG BX SPEC US GUIDE Addendum: ADDENDUM REPORT: 07/18/2021 07:53   ADDENDUM:  PATHOLOGY revealed: A. BREAST, LEFT 9:00 2 CM FN; ULTRASOUND-GUIDED  BIOPSY: - BENIGN BREAST TISSUE WITH DILATION OF A LARGE DUCT AND  FOCAL APOCRINE METAPLASIA. - NEGATIVE FOR ATYPIA AND MALIGNANCY.   Pathology results are CONCORDANT with imaging findings, per Dr.  Dorise Bullion.   Pathology  results and recommendations were discussed with patient  via telephone on 07/13/2021. Patient reported biopsy site doing well  with no adverse symptoms, and only slight tenderness at the site.  Post biopsy care instructions were reviewed, questions were answered  and my direct phone number was provided. Patient was instructed to  call [facility] for any additional questions or concerns  related to biopsy site.   RECOMMENDATION: Recommend bilateral breast MRI given patient's  persistent bloody nipple discharge.   Pathology results reported by Electa Sniff RN on 07/13/2021.   Electronically Signed    By: Dorise Bullion III M.D.    On: 07/18/2021 07:53 Narrative: CLINICAL DATA:  Biopsy of a 9 o'clock left breast mass  EXAM: ULTRASOUND GUIDED LEFT BREAST CORE NEEDLE BIOPSY  COMPARISON:  Previous exam(s).  PROCEDURE: I met with the patient and we discussed the procedure of ultrasound-guided biopsy, including benefits and alternatives. We discussed the high likelihood of a successful procedure. We discussed the risks of the procedure, including infection, bleeding, tissue injury, clip migration, and inadequate sampling. Informed written consent was given. The usual time-out protocol was performed immediately prior to the procedure.  Lesion quadrant: 9 o'clock left breast  Using sterile technique and 1% Lidocaine as local anesthetic, under direct ultrasound visualization, a 12 gauge spring-loaded device was used to perform biopsy of a 9 o'clock left breast mass using a medial approach. At the conclusion of the procedure a heart shaped tissue marker clip was deployed into the biopsy cavity. Follow up 2 view mammogram was performed and dictated separately.  IMPRESSION: Ultrasound guided biopsy of a 9 o'clock left breast mass. No apparent complications.  Electronically Signed: By: Dorise Bullion III M.D. On: 07/12/2021 09:42  Assessment  The primary encounter  diagnosis was Lumbosacral spondylosis without myelopathy. Diagnoses of Lumbar facet joint syndrome and Chronic pain syndrome were also pertinent to this visit.  Plan of Care  Good relief with first set of diagnostic lumbar facet medial branch nerve blocks as detailed above.  Patient states that she was more functional and able to perform ADLs more comfortably without having to sit and rest in between.  She is also utilizing less as needed medication.  We discussed repeating lumbar facet medial branch nerve block #2 and then considering lumbar medial branch radiofrequency ablation.  Risk and benefits reviewed and patient would like to proceed.  We will plan on doing this with p.o. Valium like we did last time.  Orders:  Orders Placed This Encounter  Procedures   LUMBAR FACET(MEDIAL BRANCH NERVE BLOCK) MBNB    Standing Status:   Future    Standing Expiration Date:   08/19/2021    Scheduling Instructions:     Procedure: Lumbar facet block (AKA.: Lumbosacral medial branch nerve block)     Side: Bilateral     Level: L3-4, L4-5, Facets (L3, L4, L5,  Medial Branch Nerves)     Sedation: P.o. Valium     Timeframe: ASAA    Order Specific Question:   Where will this procedure be performed?    Answer:   [facility]   Follow-up plan:   Return in about 2 weeks (around 08/02/2021) for B/L L3, 4, 5 Fcts #2 , in clinic (PO Valium).    Recent Visits Date Type Provider Dept  06/21/21 Procedure visit Gillis Santa, MD [facility]  05/30/21 Office Visit Gillis Santa, MD [facility]  Showing recent visits within past 90 days and meeting all other requirements Today's Visits Date Type Provider Dept  07/19/21 Office Visit Gillis Santa, MD [facility]  Showing today's visits and meeting all other requirements Future Appointments No visits were found meeting these conditions. Showing future appointments within next 90 days and meeting all other requirements  I  discussed the assessment and treatment plan with the patient. The patient was provided an opportunity to ask questions and all were answered. The patient agreed with the plan and demonstrated an understanding of the instructions.  Patient advised to call back or seek an in-person evaluation if the symptoms or condition worsens.  Duration of encounter: 64mnutes.  Note by: BGillis Santa MD Date: 07/19/2021; Time: 3:15 PM

## 2021-07-20 ENCOUNTER — Telehealth: Payer: Self-pay | Admitting: Internal Medicine

## 2021-07-20 DIAGNOSIS — N6452 Nipple discharge: Secondary | ICD-10-CM

## 2021-07-20 NOTE — Telephone Encounter (Signed)
They will place the orders for me to cosign ?

## 2021-07-20 NOTE — Telephone Encounter (Signed)
Pt needs orders for imaging / Dr. Jimmye Norman that did the Biopsy sent orders to the office to have these orders placed asap / please advise  ?

## 2021-07-25 ENCOUNTER — Telehealth: Payer: Self-pay | Admitting: Internal Medicine

## 2021-07-25 NOTE — Telephone Encounter (Signed)
Pt is worried as have not received the additional or orders to go forward pls respond to pt at [phone] ?

## 2021-07-26 DIAGNOSIS — R200 Anesthesia of skin: Secondary | ICD-10-CM | POA: Diagnosis not present

## 2021-07-26 DIAGNOSIS — R202 Paresthesia of skin: Secondary | ICD-10-CM | POA: Diagnosis not present

## 2021-07-26 DIAGNOSIS — G629 Polyneuropathy, unspecified: Secondary | ICD-10-CM | POA: Diagnosis not present

## 2021-07-27 ENCOUNTER — Other Ambulatory Visit: Payer: Self-pay | Admitting: Internal Medicine

## 2021-07-27 NOTE — Addendum Note (Signed)
Addended by: Jearld Fenton on: 07/27/2021 06:12 AM ? ? Modules accepted: Orders ? ?

## 2021-07-27 NOTE — Telephone Encounter (Signed)
I have placed the order for the MRI  ?

## 2021-07-28 ENCOUNTER — Other Ambulatory Visit: Payer: Self-pay | Admitting: Internal Medicine

## 2021-07-28 DIAGNOSIS — N6452 Nipple discharge: Secondary | ICD-10-CM

## 2021-08-09 ENCOUNTER — Ambulatory Visit
Admission: RE | Admit: 2021-08-09 | Discharge: 2021-08-09 | Disposition: A | Discharge status: Home / Self Care | Payer: Medicare Other | Source: Ambulatory Visit | Attending: Student in an Organized Health Care Education/Training Program | Admitting: Student in an Organized Health Care Education/Training Program

## 2021-08-09 ENCOUNTER — Encounter: Payer: Self-pay | Admitting: Student in an Organized Health Care Education/Training Program

## 2021-08-09 ENCOUNTER — Other Ambulatory Visit: Payer: Self-pay

## 2021-08-09 ENCOUNTER — Ambulatory Visit (HOSPITAL_BASED_OUTPATIENT_CLINIC_OR_DEPARTMENT_OTHER): Payer: Medicare Other | Admitting: Student in an Organized Health Care Education/Training Program

## 2021-08-09 DIAGNOSIS — G894 Chronic pain syndrome: Secondary | ICD-10-CM

## 2021-08-09 DIAGNOSIS — M47816 Spondylosis without myelopathy or radiculopathy, lumbar region: Secondary | ICD-10-CM

## 2021-08-09 DIAGNOSIS — M47817 Spondylosis without myelopathy or radiculopathy, lumbosacral region: Secondary | ICD-10-CM | POA: Diagnosis not present

## 2021-08-09 MED ORDER — ROPIVACAINE HCL 2 MG/ML IJ SOLN
9.0000 mL | Freq: Once | INTRAMUSCULAR | Status: AC
  Administered 2021-08-09: 9 mL via PERINEURAL
  Filled 2021-08-09: qty 20

## 2021-08-09 MED ORDER — DEXAMETHASONE SODIUM PHOSPHATE 10 MG/ML IJ SOLN
10.0000 mg | Freq: Once | INTRAMUSCULAR | Status: AC
  Administered 2021-08-09: 10 mg
  Filled 2021-08-09: qty 1

## 2021-08-09 MED ORDER — DIAZEPAM 5 MG PO TABS
5.0000 mg | ORAL_TABLET | ORAL | Status: AC
  Administered 2021-08-09: 5 mg via ORAL

## 2021-08-09 MED ORDER — ROPIVACAINE HCL 2 MG/ML IJ SOLN
9.0000 mL | Freq: Once | INTRAMUSCULAR | Status: AC
  Administered 2021-08-09: 9 mL via PERINEURAL

## 2021-08-09 MED ORDER — LIDOCAINE HCL (PF) 1 % IJ SOLN
INTRAMUSCULAR | Status: AC
  Filled 2021-08-09: qty 15

## 2021-08-09 MED ORDER — LIDOCAINE HCL 2 % IJ SOLN
20.0000 mL | Freq: Once | INTRAMUSCULAR | Status: AC
  Administered 2021-08-09: 100 mg

## 2021-08-09 MED ORDER — DIAZEPAM 5 MG PO TABS
ORAL_TABLET | ORAL | Status: AC
  Filled 2021-08-09: qty 1

## 2021-08-09 NOTE — Patient Instructions (Signed)
Pain Management Discharge Instructions  General Discharge Instructions :  If you need to reach your doctor call: Monday-Friday 8:00 am - 4:00 pm at [phone] or toll free [phone].  After clinic hours [phone] to have operator reach doctor.  Bring all of your medication bottles to all your appointments in the pain clinic.  To cancel or reschedule your appointment with Pain Management please remember to call 24 hours in advance to avoid a fee.  Refer to the educational materials which you have been given on: General Risks, I had my Procedure. Discharge Instructions, Post Sedation.  Post Procedure Instructions:  The drugs you were given will stay in your system until tomorrow, so for the next 24 hours you should not drive, make any legal decisions or drink any alcoholic beverages.  You may eat anything you prefer, but it is better to start with liquids then soups and crackers, and gradually work up to solid foods.  Please notify your doctor immediately if you have any unusual bleeding, trouble breathing or pain that is not related to your normal pain.  Depending on the type of procedure that was done, some parts of your body may feel week and/or numb.  This usually clears up by tonight or the next day.  Walk with the use of an assistive device or accompanied by an adult for the 24 hours.  You may use ice on the affected area for the first 24 hours.  Put ice in a Ziploc bag and cover with a towel and place against area 15 minutes on 15 minutes off.  You may switch to heat after 24 hours.Facet Blocks Patient Information  Description: The facets are joints in the spine between the vertebrae.  Like any joints in the body, facets can become irritated and painful.  Arthritis can also effect the facets.  By injecting steroids and local anesthetic in and around these joints, we can temporarily block the nerve supply to them.  Steroids act directly on irritated nerves and tissues to  reduce selling and inflammation which often leads to decreased pain.  Facet blocks may be done anywhere along the spine from the neck to the low back depending upon the location of your pain.   After numbing the skin with local anesthetic (like Novocaine), a small needle is passed onto the facet joints under x-ray guidance.  You may experience a sensation of pressure while this is being done.  The entire block usually lasts about 15-25 minutes.   Conditions which may be treated by facet blocks:  Low back/buttock pain Neck/shoulder pain Certain types of headaches  Preparation for the injection:  Do not eat any solid food or dairy products within 8 hours of your appointment. You may drink clear liquid up to 3 hours before appointment.  Clear liquids include water, black coffee, juice or soda.  No milk or cream please. You may take your regular medication, including pain medications, with a sip of water before your appointment.  Diabetics should hold regular insulin (if taken separately) and take 1/2 normal NPH dose the morning of the procedure.  Carry some sugar containing items with you to your appointment. A driver must accompany you and be prepared to drive you home after your procedure. Bring all your current medications with you. An IV may be inserted and sedation may be given at the discretion of the physician. A blood pressure cuff, EKG and other monitors will often be applied during the procedure.  Some patients may need to   have extra oxygen administered for a short period. You will be asked to provide medical information, including your allergies and medications, prior to the procedure.  We must know immediately if you are taking blood thinners (like Coumadin/Warfarin) or if you are allergic to IV iodine contrast (dye).  We must know if you could possible be pregnant.  Possible side-effects:  Bleeding from needle site Infection (rare, may require surgery) Nerve injury (rare) Numbness  & tingling (temporary) Difficulty urinating (rare, temporary) Spinal headache (a headache worse with upright posture) Light-headedness (temporary) Pain at injection site (serveral days) Decreased blood pressure (rare, temporary) Weakness in arm/leg (temporary) Pressure sensation in back/neck (temporary)   Call if you experience:  Fever/chills associated with headache or increased back/neck pain Headache worsened by an upright position New onset, weakness or numbness of an extremity below the injection site Hives or difficulty breathing (go to the emergency room) Inflammation or drainage at the injection site(s) Severe back/neck pain greater than usual New symptoms which are concerning to you  Please note:  Although the local anesthetic injected can often make your back or neck feel good for several hours after the injection, the pain will likely return. It takes 3-7 days for steroids to work.  You may not notice any pain relief for at least one week.  If effective, we will often do a series of 2-3 injections spaced 3-6 weeks apart to maximally decrease your pain.  After the initial series, you may be a candidate for a more permanent nerve block of the facets.  If you have any questions, please call [phone] [facility] 

## 2021-08-09 NOTE — Progress Notes (Signed)
Safety precautions to be maintained throughout the outpatient stay will include: orient to surroundings, keep bed in low position, maintain call bell within reach at all times, provide assistance with transfer out of bed and ambulation.  

## 2021-08-09 NOTE — Progress Notes (Signed)
PROVIDER NOTE: Interpretation of information contained herein should be left to medically-trained personnel. Specific patient instructions are provided elsewhere under "Patient Instructions" section of medical record. This document was created in part using STT-dictation technology, any transcriptional errors that may result from this process are unintentional.  ?Patient: Kristin Porter ?Type: Established ?DOB: December 06, 1960 ?MRN: [id number] ?PCP: Kristin Fenton, NP  Service: Procedure ?DOS: 08/09/2021 ?Setting: Ambulatory ?Location: Ambulatory outpatient facility ?Delivery: Face-to-face Provider: Gillis Santa, MD ?Specialty: Interventional Pain Management ?Specialty designation: 09 ?Location: Outpatient facility ?Ref. Prov.: Gillis Santa, MD   ? ?Primary Reason for Visit: Interventional Pain Management Treatment. ?CC: Back Pain (Lumbar bilateral) ?  ?Procedure:          ? Type: Lumbar Facet, Medial Branch Block(s) #2  ?Laterality: Bilateral  ?Level: L3, L4, L5,  Medial Branch Level(s). Injecting these levels blocks the L3-4 and L5-S1 lumbar facet joints. ?Imaging: Fluoroscopic guidance ?Anesthesia: Local anesthesia (1-2% Lidocaine) ?Anxiolysis: Oral Valium 5 mg ?Sedation: None. ?DOS: 08/09/2021 ?Performed by: Gillis Santa, MD ? ?Primary Purpose: Diagnostic/Therapeutic ?Indications: Low back pain severe enough to impact quality of life or function. ?1. Lumbosacral spondylosis without myelopathy   ?2. Lumbar facet joint syndrome   ?3. Chronic pain syndrome   ? ?NAS-11 Pain score:  ? Pre-procedure: 6 /10  ? Post-procedure: 3 /10  ? ?  ?Position / Prep / Materials:  ?Position: Prone  ?Prep solution: Kristin Porter (Iodine Povacrylex [0.7% available iodine] and Isopropyl Alcohol, 74% w/w) ?Area Prepped: Posterolateral Lumbosacral Spine (Wide prep: From the lower border of the scapula down to the end of the tailbone and from flank to flank.) ? ?Materials:  ?Tray: Block ?Needle(s):  ?Type: Spinal  ?Gauge (G): 25  ?Length:  3.5-in ?Qty: 2 ? ?Pre-op H&P Assessment:  ?Kristin Porter is a 61 y.o. (year old), female patient, seen today for interventional treatment. She  has a past surgical history that includes Breast biopsy (Right, 2018); Esophagogastroduodenoscopy (egd) with propofol (N/A, 02/22/2021); and Breast biopsy (Left, 07/12/2021). Kristin Porter has a current medication list which includes the following prescription(s): albuterol, albuterol, amlodipine, aspirin, baclofen, belsomra, blood pressure kit, repatha sureclick, fluticasone, meloxicam, omeprazole, paroxetine, pregabalin, triamcinolone cream, and omeprazole. Her primarily concern today is the Back Pain (Lumbar bilateral) ? ?Initial Vital Signs:  ?Pulse/HCG Rate: 78ECG Heart Rate: 72 ?Temp: (!) 97.3 ?F (36.3 ?C) ?Resp: 16 ?BP:  ?(!) 151/86 ?SpO2: 98 % ? ?BMI: Estimated body mass index is 41 kg/m? as calculated from the following: ?  Height as of this encounter: $RemoveBeforeD'4\' 11"'sNSrjxgcpGSPai$  (1.499 m). ?  Weight as of this encounter: 203 lb (92.1 kg). ? ?Risk Assessment: ?Allergies: Reviewed. She is allergic to tizanidine, lisinopril, and tramadol.  ?Allergy Precautions: None required ?Coagulopathies: Reviewed. None identified.  ?Blood-thinner therapy: None at this time ?Active Infection(s): Reviewed. None identified. Kristin Porter is afebrile ? ?Site Confirmation: Kristin Porter was asked to confirm the procedure and laterality before marking the site ?Procedure checklist: Completed ?Consent: Before the procedure and under the influence of no sedative(s), amnesic(s), or anxiolytics, the patient was informed of the treatment options, risks and possible complications. To fulfill our ethical and legal obligations, as recommended by the American Medical Association's Code of Ethics, I have informed the patient of my clinical impression; the nature and purpose of the treatment or procedure; the risks, benefits, and possible complications of the intervention; the alternatives, including doing nothing; the  risk(s) and benefit(s) of the alternative treatment(s) or procedure(s); and the risk(s) and benefit(s) of doing nothing. ?The patient was  provided information about the general risks and possible complications associated with the procedure. These may include, but are not limited to: failure to achieve desired goals, infection, bleeding, organ or nerve damage, allergic reactions, paralysis, and death. ?In addition, the patient was informed of those risks and complications associated to Spine-related procedures, such as failure to decrease pain; infection (i.e.: Meningitis, epidural or intraspinal abscess); bleeding (i.e.: epidural hematoma, subarachnoid hemorrhage, or any other type of intraspinal or peri-dural bleeding); organ or nerve damage (i.e.: Any type of peripheral nerve, nerve root, or spinal cord injury) with subsequent damage to sensory, motor, and/or autonomic systems, resulting in permanent pain, numbness, and/or weakness of one or several areas of the body; allergic reactions; (i.e.: anaphylactic reaction); and/or death. ?Furthermore, the patient was informed of those risks and complications associated with the medications. These include, but are not limited to: allergic reactions (i.e.: anaphylactic or anaphylactoid reaction(s)); adrenal axis suppression; blood sugar elevation that in diabetics may result in ketoacidosis or comma; water retention that in patients with history of congestive heart failure may result in shortness of breath, pulmonary edema, and decompensation with resultant heart failure; weight gain; swelling or edema; medication-induced neural toxicity; particulate matter embolism and blood vessel occlusion with resultant organ, and/or nervous system infarction; and/or aseptic necrosis of one or more joints. ?Finally, the patient was informed that Medicine is not an exact science; therefore, there is also the possibility of unforeseen or unpredictable risks and/or possible complications  that may result in a catastrophic outcome. The patient indicated having understood very clearly. We have given the patient no guarantees and we have made no promises. Enough time was given to the patient to ask questions, all of which were answered to the patient's satisfaction. Ms. Luckadoo has indicated that she wanted to continue with the procedure. ?Attestation: I, the ordering provider, attest that I have discussed with the patient the benefits, risks, side-effects, alternatives, likelihood of achieving goals, and potential problems during recovery for the procedure that I have provided informed consent. ?Date  Time: 08/09/2021 11:17 AM ? ?Pre-Procedure Preparation:  ?Monitoring: As per clinic protocol. Respiration, ETCO2, SpO2, BP, heart rate and rhythm monitor placed and checked for adequate function ?Safety Precautions: Patient was assessed for positional comfort and pressure points before starting the procedure. ?Time-out: I initiated and conducted the "Time-out" before starting the procedure, as per protocol. The patient was asked to participate by confirming the accuracy of the "Time Out" information. Verification of the correct person, site, and procedure were performed and confirmed by me, the nursing staff, and the patient. "Time-out" conducted as per Joint Commission's Universal Protocol (UP.01.01.01). ?Time: 6122 ? ?Description of Procedure:          ?Laterality: Bilateral. The procedure was performed in identical fashion on both sides. ?Levels:  L3, L4, L5, Medial Branch Level(s) ? ?Safety Precautions: Aspiration looking for blood return was conducted prior to all injections. At no point did we inject any substances, as a needle was being advanced. Before injecting, the patient was told to immediately notify me if she was experiencing any new onset of "ringing in the ears, or metallic taste in the mouth". No attempts were made at seeking any paresthesias. Safe injection practices and needle disposal  techniques used. Medications properly checked for expiration dates. SDV (single dose vial) medications used. After the completion of the procedure, all disposable equipment used was discarded in the proper desi

## 2021-08-10 ENCOUNTER — Telehealth: Payer: Self-pay | Admitting: *Deleted

## 2021-08-10 NOTE — Telephone Encounter (Signed)
Post procedure call;  patient states she had a headache the rest of the day after procedure.  Reports that it is better this morning.  Stated that she needs to stay hydrated and please let us know if it  does not continue to improve.  ?

## 2021-08-16 ENCOUNTER — Ambulatory Visit
Admission: RE | Admit: 2021-08-16 | Discharge: 2021-08-16 | Disposition: A | Discharge status: Home / Self Care | Payer: Medicare Other | Source: Ambulatory Visit | Attending: Internal Medicine | Admitting: Internal Medicine

## 2021-08-16 DIAGNOSIS — N6452 Nipple discharge: Secondary | ICD-10-CM | POA: Diagnosis not present

## 2021-08-16 MED ORDER — GADOBUTROL 1 MMOL/ML IV SOLN
9.0000 mL | Freq: Once | INTRAVENOUS | Status: AC | PRN
  Administered 2021-08-16: 9 mL via INTRAVENOUS

## 2021-08-17 ENCOUNTER — Other Ambulatory Visit: Payer: Self-pay | Admitting: Internal Medicine

## 2021-08-17 DIAGNOSIS — N63 Unspecified lump in unspecified breast: Secondary | ICD-10-CM

## 2021-08-17 DIAGNOSIS — R928 Other abnormal and inconclusive findings on diagnostic imaging of breast: Secondary | ICD-10-CM

## 2021-08-21 DIAGNOSIS — R200 Anesthesia of skin: Secondary | ICD-10-CM | POA: Diagnosis not present

## 2021-08-21 DIAGNOSIS — R202 Paresthesia of skin: Secondary | ICD-10-CM | POA: Diagnosis not present

## 2021-08-21 DIAGNOSIS — G629 Polyneuropathy, unspecified: Secondary | ICD-10-CM | POA: Diagnosis not present

## 2021-08-30 ENCOUNTER — Telehealth: Payer: Self-pay

## 2021-08-30 NOTE — Telephone Encounter (Signed)
LM for patient to call office and go over pre virtual appointment questions.    ?

## 2021-08-31 ENCOUNTER — Encounter: Payer: Self-pay | Admitting: Student in an Organized Health Care Education/Training Program

## 2021-08-31 ENCOUNTER — Ambulatory Visit
Payer: Medicare Other | Attending: Student in an Organized Health Care Education/Training Program | Admitting: Student in an Organized Health Care Education/Training Program

## 2021-08-31 DIAGNOSIS — G8929 Other chronic pain: Secondary | ICD-10-CM

## 2021-08-31 DIAGNOSIS — G894 Chronic pain syndrome: Secondary | ICD-10-CM | POA: Diagnosis not present

## 2021-08-31 DIAGNOSIS — M7918 Myalgia, other site: Secondary | ICD-10-CM | POA: Diagnosis not present

## 2021-08-31 DIAGNOSIS — M47816 Spondylosis without myelopathy or radiculopathy, lumbar region: Secondary | ICD-10-CM | POA: Diagnosis not present

## 2021-08-31 DIAGNOSIS — M25552 Pain in left hip: Secondary | ICD-10-CM

## 2021-08-31 DIAGNOSIS — M545 Low back pain, unspecified: Secondary | ICD-10-CM | POA: Diagnosis not present

## 2021-08-31 DIAGNOSIS — M25551 Pain in right hip: Secondary | ICD-10-CM

## 2021-08-31 DIAGNOSIS — G950 Syringomyelia and syringobulbia: Secondary | ICD-10-CM

## 2021-08-31 DIAGNOSIS — M47817 Spondylosis without myelopathy or radiculopathy, lumbosacral region: Secondary | ICD-10-CM | POA: Diagnosis not present

## 2021-08-31 DIAGNOSIS — M797 Fibromyalgia: Secondary | ICD-10-CM

## 2021-08-31 MED ORDER — TRAMADOL HCL 50 MG PO TABS: 50.0000 mg | ORAL_TABLET | Freq: Four times a day (QID) | ORAL | 1 refills | Status: DC | PRN

## 2021-08-31 NOTE — Progress Notes (Signed)
Patient: Kristin Porter  Service Category: E/M  Provider: Gillis Santa, MD  ?DOB: 08-10-1960  DOS: 08/31/2021  Location: Office  ?MRN: [id number]  Setting: Ambulatory outpatient  Referring Provider: Jearld Fenton, NP  ?Type: Established Patient  Specialty: Interventional Pain Management  PCP: Jearld Fenton, NP  ?Location: Remote location  Delivery: TeleHealth    ? ?Virtual Encounter - Pain Management ?PROVIDER NOTE: Information contained herein reflects review and annotations entered in association with encounter. Interpretation of such information and data should be left to medically-trained personnel. Information provided to patient can be located elsewhere in the medical record under "Patient Instructions". Document created using STT-dictation technology, any transcriptional errors that may result from process are unintentional.  ?  ?Contact & Pharmacy ?Preferred: [phone] ?Home: [phone] (home) ?Mobile: [phone] (mobile) ?E-mail: [email or web address]  ?[facility], [place] ?[address] ?[place] [address] ?Phone: [phone] Fax: [phone] ? ?[id number] [address] [id number]?Phone: [phone]: [phone]?  ?Pre-screening  ?Kristin Porter offered "in-person" vs "virtual" encounter. She indicated preferring virtual for this encounter.  ? ?Reason ?COVID-19*  Social distancing based on CDC and AMA recommendations.  ? ?I contacted [place] 08/31/2021 via telephone.      I clearly identified myself as BGillis Santa MD. I verified that I was speaking with the correct person using two identifiers (Name: Kristin Porter and date of birth: 1Jun 10, 1962. ? ?Consent ?I sought verbal advanced consent from DIndependencefor virtual visit interactions. I informed Kristin Porter of possible security and privacy concerns, risks, and limitations associated with providing  "not-in-person" medical evaluation and management services. I also informed Kristin Porter of the availability of "in-person" appointments. Finally, I informed her that there would be a charge for the virtual visit and that she could be  personally, fully or partially, financially responsible for it. [name] understanding and agreed to proceed.  ? ?Historic Elements   ?Ms. Fernande OSHALANE FLORENDOis a 61y.o. year old, female patient evaluated today after our last contact on 08/09/2021. Kristin Porter has a past medical history of Arthritis, Fibromyalgia, Hypertension, and Neuropathy. She also  has a past surgical history that includes Breast biopsy (Right, 2018); Esophagogastroduodenoscopy (egd) with propofol (N/A, 02/22/2021); and Breast biopsy (Left, 07/12/2021). Ms. HCuppetthas a current medication list which includes the following prescription(s): albuterol, albuterol, amlodipine, aspirin, baclofen, belsomra, blood pressure kit, repatha sureclick, fluticasone, meloxicam, omeprazole, paroxetine, pregabalin, tramadol, triamcinolone cream, and omeprazole. She  reports that she quit smoking about 53 years ago. Her smoking use included cigarettes. She has never used smokeless tobacco. She reports that she does not currently use alcohol. She reports that she does not use drugs. Ms. HHentonis allergic to tizanidine, lisinopril, and tramadol.  ? ?HPI  ?Today, she is being contacted for both, medication management and a post-procedure assessment. ? ? ?Post-procedure evaluation  ? Type: Lumbar Facet, Medial Branch Block(s) #2  ?Laterality: Bilateral  ?Level: L3, L4, L5,  Medial Branch Level(s). Injecting these levels blocks the L3-4 and L5-S1 lumbar facet joints. ?Imaging: Fluoroscopic guidance ?Anesthesia: Local anesthesia (1-2% Lidocaine) ?Anxiolysis: Oral Valium 5 mg ?Sedation: None. ?DOS: 08/09/2021 ?Performed by: BGillis Santa MD ? ?Primary Purpose: Diagnostic/Therapeutic ?Indications: Low back pain severe  enough to impact quality of life or function. ?1. Lumbosacral spondylosis without myelopathy   ?2. Lumbar facet joint syndrome   ?3. Chronic pain  syndrome   ? ?NAS-11 Pain score:  ? Pre-procedure: 6 /10  ? Post-procedure: 3 /10  ? ?   ?Effectiveness:  ?Initial hour after procedure: 100 %  ?Subsequent 4-6 hours post-procedure: 100 %  ?Analgesia past initial 6 hours: 80% for first week then gradually decreased to 50 % (ongoing)  ?Ongoing improvement:  ?Analgesic:  50-60% ?Function: Kristin Porter reports improvement in function ?ROM: Kristin Porter reports improvement in ROM ? ?Kristin Porter will contact us when she would like to move forward with RFA. ? ?Pharmacotherapy Assessment  ? ?Opioid Analgesic: Patient is experiencing mood side effects with buprenorphine she states that it is causing her to be depressed.  We will transition to tramadol as below. ? ?Monitoring: ?[place] PMP: PDMP reviewed during this encounter.       ?Pharmacotherapy: No side-effects or adverse reactions reported. ?Compliance: No problems identified. ?Effectiveness: Clinically acceptable. ?Plan: Refer to "POC". UDS:  ?Summary  ?Date Value Ref Range Status  ?10/13/2020 Note  Final  ?  Comment:  ?  ==================================================================== ?[facility] Select 13 (MW) ?==================================================================== ?Test                             Result       Flag       Units ? ?Drug Present and Declared for Prescription Verification ?  Hydrocodone                    697          EXPECTED   ng/mg creat ?  Hydromorphone                  365          EXPECTED   ng/mg creat ?  Dihydrocodeine                 240          EXPECTED   ng/mg creat ?  Norhydrocodone                 620          EXPECTED   ng/mg creat ?   Sources of hydrocodone include scheduled prescription medications. ?   Hydromorphone, dihydrocodeine and norhydrocodone are expected ?   metabolites of hydrocodone. Hydromorphone and dihydrocodeine are ?    also available as scheduled prescription medications. ? ?Drug Present not Declared for Prescription Verification ?  Oxazepam                       21           UNEXPECTED ng/mg creat ?  Temazepam                      15           UNEXPECTED ng/mg creat ?   Oxazepam and temazepam are expected metabolites of diazepam. ?   Oxazepam is also an expected metabolite of other benzodiazepine ?   drugs, including chlordiazepoxide, prazepam, clorazepate, halazepam, ?   and temazepam.  Oxazepam and temazepam are available as scheduled ?   prescription medications. ? ?==================================================================== ?Test                      Result    Flag   Units      Ref Range ?  Creatinine  149              mg/dL      >=20 ?==================================================================== ?Declared Medications: ? The flagging and interpretation on this report are based on the ? following declared medications.  Unexpected results may arise from ? inaccuracies in the declared medications. ? ? **Note: The testing scope of this panel includes these medications: ? ? Hydrocodone ? ? **Note: The testing scope of this panel does not include the ? following reported medications: ? ? Acetaminophen ? Albuterol ? Amlodipine ? Baclofen ? Evolocumab (Repatha) ? Fluticasone ? Hydrochlorothiazide ? Meloxicam ? Omeprazole ? Paroxetine ? Pregabalin ?==================================================================== ?For clinical consultation, please call [phone]. ?==================================================================== ?  ?  ? ?Laboratory Chemistry Profile  ? ?Renal ?Lab Results  ?Component Value Date  ? BUN 11 12/12/2020  ? CREATININE 0.63 12/12/2020  ? BCR NOT APPLICABLE 00/04/3934  ? GFRAA 107 03/22/2020  ? GFRNONAA 92 03/22/2020  ?  Hepatic ?Lab Results  ?Component Value Date  ? AST 17 12/12/2020  ? ALT 16 12/12/2020  ?  ?Electrolytes ?Lab Results  ?Component Value Date  ? NA 141  12/12/2020  ? K 4.4 12/12/2020  ? CL 107 12/12/2020  ? CALCIUM 9.1 12/12/2020  ?  Bone ?No results found for: [place], [id number], [id number], [id number], [place], 25OHVITD2, 25OHVITD3, TESTOFREE, TESTOSTERONE

## 2021-08-31 NOTE — Assessment & Plan Note (Signed)
Status post 2 sets of positive diagnostic lumbar facet medial branch nerve blocks bilaterally at L3, L4, L5.  Next step would be lumbar radiofrequency ablation of the medial branch nerves.  Patient states that she will contact us when she would like to move forward with this as she is still experiencing pretty good pain relief from her medial branch nerve blocks. ?

## 2021-09-04 ENCOUNTER — Other Ambulatory Visit: Payer: Self-pay

## 2021-09-04 ENCOUNTER — Telehealth: Payer: Self-pay | Admitting: Student in an Organized Health Care Education/Training Program

## 2021-09-04 MED ORDER — TRAMADOL HCL 50 MG PO TABS: 50.0000 mg | ORAL_TABLET | Freq: Four times a day (QID) | ORAL | 1 refills | Status: AC | PRN

## 2021-09-04 NOTE — Telephone Encounter (Signed)
Patient states you were going to call her in Tramadol last week. Can you do this aand I wil call her back. Thnaks.. Medication goes to [facility] on [address] ?

## 2021-09-04 NOTE — Telephone Encounter (Signed)
Called patient and she states that medication was not received. Sent new prescription to MD to resend ?

## 2021-09-06 ENCOUNTER — Inpatient Hospital Stay: Admission: RE | Admit: 2021-09-06 | Payer: Medicare Other | Source: Ambulatory Visit

## 2021-09-27 ENCOUNTER — Ambulatory Visit
Admission: RE | Admit: 2021-09-27 | Discharge: 2021-09-27 | Disposition: A | Discharge status: Home / Self Care | Payer: Medicare Other | Source: Ambulatory Visit | Attending: Internal Medicine | Admitting: Internal Medicine

## 2021-09-27 DIAGNOSIS — R928 Other abnormal and inconclusive findings on diagnostic imaging of breast: Secondary | ICD-10-CM | POA: Diagnosis not present

## 2021-09-27 DIAGNOSIS — N63 Unspecified lump in unspecified breast: Secondary | ICD-10-CM | POA: Diagnosis not present

## 2021-09-27 DIAGNOSIS — N6489 Other specified disorders of breast: Secondary | ICD-10-CM | POA: Diagnosis not present

## 2021-09-28 DIAGNOSIS — G5603 Carpal tunnel syndrome, bilateral upper limbs: Secondary | ICD-10-CM | POA: Diagnosis not present

## 2021-10-09 ENCOUNTER — Telehealth: Payer: Self-pay

## 2021-10-09 NOTE — Telephone Encounter (Signed)
Copied from [place] [id number]. Topic: General - Call Back - No Documentation >> Oct 09, 2021  1:03 PM Erick Blinks wrote: Reason for CRM: Pt called regarding her breast imaging, she says she is waiting for her next appt and has not been contacted. Requesting to speak to clinic, has questions  Best contact: [phone]

## 2021-10-13 NOTE — Telephone Encounter (Signed)
Kristin Porter advised pt they did not get a clear image on her biopsy, and it needs to be repeated ASAP. Pt not heard anything, and feels it is time sensitive. She needs [facility] to put in an order please.

## 2021-10-13 NOTE — Telephone Encounter (Signed)
Pt made an additional call regarding this request.

## 2021-10-13 NOTE — Telephone Encounter (Signed)
If they did not get a clear margin, they will send the order to me to sign. I have not heard anything from [facility].

## 2021-10-13 NOTE — Telephone Encounter (Signed)
[name] reports [facility] wants her to go somewhere in [place] to do another biopsy?  She also states that [facility] told her "It was the responsibility of the PCP to place the order."  I tried calling [facility] and it went to Voice Mail.  I'll try again on Tuesday.     Pt aware.

## 2021-10-18 ENCOUNTER — Telehealth: Payer: Self-pay | Admitting: Internal Medicine

## 2021-10-18 DIAGNOSIS — G629 Polyneuropathy, unspecified: Secondary | ICD-10-CM | POA: Diagnosis not present

## 2021-10-18 DIAGNOSIS — G5603 Carpal tunnel syndrome, bilateral upper limbs: Secondary | ICD-10-CM | POA: Diagnosis not present

## 2021-10-18 DIAGNOSIS — N6320 Unspecified lump in the left breast, unspecified quadrant: Secondary | ICD-10-CM

## 2021-10-18 NOTE — Telephone Encounter (Signed)
Spoke with pt. In regards to rescheduling her AWV.  Patient asked for someone to please call her back today in regards to an issue she has been having regarding an order for additional  breast imaging. She stated someone was suppose to call her yesterday and she hasn't heard back yet. She would like a follow up call today in regards to this.

## 2021-10-19 ENCOUNTER — Other Ambulatory Visit: Payer: Self-pay | Admitting: Internal Medicine

## 2021-10-19 DIAGNOSIS — N6320 Unspecified lump in the left breast, unspecified quadrant: Secondary | ICD-10-CM

## 2021-10-19 NOTE — Telephone Encounter (Signed)
I have ordered this

## 2021-10-19 NOTE — Addendum Note (Signed)
Addended by: Jearld Fenton on: 10/19/2021 11:56 AM   Modules accepted: Orders

## 2021-10-19 NOTE — Telephone Encounter (Signed)
Duplicate message see telephone encounter.      Thanks,   -Mickel Baas

## 2021-10-19 NOTE — Telephone Encounter (Signed)
Left message advising pt.   Thanks,   -Avenir Lozinski  

## 2021-10-19 NOTE — Telephone Encounter (Signed)
[facility] stated pt needs an MRI guided core needle biopsy left breast.  They do not do them at [facility] but will at [facility].    I'm sorry I'm not sure what to order for this.

## 2021-10-19 NOTE — Telephone Encounter (Signed)
Can you call please call [facility] and see if there is some sort of MRI biopsy order that I need to be putting in for this patient or they putting in and I need to cosign it

## 2021-10-20 ENCOUNTER — Ambulatory Visit (INDEPENDENT_AMBULATORY_CARE_PROVIDER_SITE_OTHER): Payer: Medicare Other

## 2021-10-20 VITALS — Wt 203.0 lb

## 2021-10-20 DIAGNOSIS — Z0000 Encounter for general adult medical examination without abnormal findings: Secondary | ICD-10-CM

## 2021-10-20 NOTE — Progress Notes (Signed)
Virtual Visit via Telephone Note  I connected with  [name] on 10/20/21 at  3:00 PM EDT by telephone and verified that I am speaking with the correct person using two identifiers.  Location: Patient: home Provider: [facility] Persons participating in the virtual visit: Marvin   I discussed the limitations, risks, security and privacy concerns of performing an evaluation and management service by telephone and the availability of in person appointments. The patient expressed understanding and agreed to proceed.  Interactive audio and video telecommunications were attempted between this nurse and patient, however failed, due to patient having technical difficulties OR patient did not have access to video capability.  We continued and completed visit with audio only.  Some vital signs may be absent or patient reported.   Dionisio David, LPN  Subjective:   Kristin Porter is a 61 y.o. female who presents for Medicare Annual (Subsequent) preventive examination.  Review of Systems     Cardiac Risk Factors include: advanced age (>37mn, >>93women);sedentary lifestyle;dyslipidemia;hypertension     Objective:    Today's Vitals   10/20/21 1513  Weight: 203 lb (92.1 kg)   Body mass index is 41 kg/m.     10/20/2021    3:05 PM 06/21/2021    1:09 PM 02/22/2021   10:50 AM 11/09/2020    1:17 PM 10/18/2020    2:55 PM 04/21/2020   10:39 AM 12/29/2019    1:44 PM  Advanced Directives  Does Patient Have a Medical Advance Directive? No No No No No No No  Does patient want to make changes to medical advance directive?       Yes (MAU/Ambulatory/Procedural Areas - Information given)  Would patient like information on creating a medical advance directive? No - Patient declined No - Patient declined No - Patient declined Yes (MAU/Ambulatory/Procedural Areas - Information given)       Current Medications (verified) Outpatient Encounter Medications as of 10/20/2021   Medication Sig   albuterol (PROVENTIL) (2.5 MG/3ML) 0.083% nebulizer solution Take 3 mLs (2.5 mg total) by nebulization every 6 (six) hours as needed for wheezing or shortness of breath.   baclofen (LIORESAL) 10 MG tablet Take 1 tablet (10 mg total) by mouth 4 (four) times daily as needed for muscle spasms.   BELSOMRA 5 MG TABS TAKE 1 TABLET BY MOUTH AT BEDTIME   Blood Pressure Monitoring (BLOOD PRESSURE KIT) DEVI 1 Units by Does not apply route in the morning and at bedtime.   Evolocumab (REPATHA SURECLICK) 1283MG/ML SOAJ Inject 1 pen into the skin every 14 (fourteen) days.   fluticasone (FLONASE) 50 MCG/ACT nasal spray Place 1 spray into both nostrils 2 (two) times daily.   meloxicam (MOBIC) 15 MG tablet Take 1 tablet (15 mg total) by mouth daily as needed for pain.   omeprazole (PRILOSEC) 40 MG capsule Take 40 mg by mouth 2 (two) times daily.   PARoxetine (PAXIL) 10 MG tablet Take 1 tablet (10 mg total) by mouth daily.   pregabalin (LYRICA) 100 MG capsule Take 2 capsules (200 mg total) by mouth at bedtime.   triamcinolone cream (KENALOG) 0.1 % Apply 1 application topically 2 (two) times daily.   albuterol (VENTOLIN HFA) 108 (90 Base) MCG/ACT inhaler Inhale 2 puffs into the lungs every 6 (six) hours as needed for wheezing or shortness of breath. (Patient not taking: Reported on 10/20/2021)   amLODipine (NORVASC) 10 MG tablet Take 1 tablet (10 mg total) by mouth daily.   aspirin  81 MG EC tablet Take 1 tablet (81 mg total) by mouth daily. Swallow whole. (Patient not taking: Reported on 10/20/2021)   omeprazole (PRILOSEC) 20 MG capsule Take 1 capsule (20 mg total) by mouth 2 (two) times daily before a meal for 14 days. (Patient not taking: Reported on 08/09/2021)   traMADol (ULTRAM) 50 MG tablet Take 1 tablet (50 mg total) by mouth every 6 (six) hours as needed for severe pain. (Patient not taking: Reported on 10/20/2021)   No facility-administered encounter medications on file as of 10/20/2021.     Allergies (verified) Tizanidine, Lisinopril, and Tramadol   History: Past Medical History:  Diagnosis Date   Arthritis    Bilateral hips   Fibromyalgia    Hypertension    Neuropathy    Past Surgical History:  Procedure Laterality Date   BREAST BIOPSY Right 2018   benign, clip was placed   BREAST BIOPSY Left 07/12/2021   [place] bx , heart marker, path pending   ESOPHAGOGASTRODUODENOSCOPY (EGD) WITH PROPOFOL N/A 02/22/2021   Procedure: ESOPHAGOGASTRODUODENOSCOPY (EGD) WITH PROPOFOL;  Surgeon: Lin Landsman, MD;  Location: [facility];  Service: Gastroenterology;  Laterality: N/A;   Family History  Family history unknown: Yes   Social History   Socioeconomic History   Marital status: Divorced    Spouse name: Not on file   Number of children: Not on file   Years of education: Not on file   Highest education level: Not on file  Occupational History   Occupation: disability   Tobacco Use   Smoking status: Former    Types: Cigarettes    Quit date: 1970    Years since quitting: 53.4   Smokeless tobacco: Never  Vaping Use   Vaping Use: Never used  Substance and Sexual Activity   Alcohol use: Not Currently   Drug use: Never   Sexual activity: Not Currently  Other Topics Concern   Not on file  Social History Narrative   Not on file   Social Determinants of Health   Financial Resource Strain: Low Risk    Difficulty of Paying Living Expenses: Not hard at all  Food Insecurity: No Food Insecurity   Worried About Charity fundraiser in the Last Year: Never true   Arboriculturist in the Last Year: Never true  Transportation Needs: No Transportation Needs   Lack of Transportation (Medical): No   Lack of Transportation (Non-Medical): No  Physical Activity: Insufficiently Active   Days of Exercise per Week: 2 days   Minutes of Exercise per Session: 20 min  Stress: No Stress Concern Present   Feeling of Stress : Not at all  Social Connections: Socially Isolated    Frequency of Communication with Friends and Family: More than three times a week   Frequency of Social Gatherings with Friends and Family: Three times a week   Attends Religious Services: Never   Active Member of Clubs or Organizations: No   Attends Music therapist: Never   Marital Status: Divorced    Tobacco Counseling Counseling given: Not Answered   Clinical Intake:  Pre-visit preparation completed: Yes  Pain : No/denies pain     Nutritional Risks: None Diabetes: No  How often do you need to have someone help you when you read instructions, pamphlets, or other written materials from your doctor or pharmacy?: 1 - Never  Diabetic?no  Interpreter Needed?: No  Information entered by :: Kirke Shaggy, LPN   Activities of Daily Living  10/20/2021    3:06 PM 12/12/2020   10:11 AM  In your present state of health, do you have any difficulty performing the following activities:  Hearing? 0 0  Vision? 0 0  Difficulty concentrating or making decisions? 0 0  Walking or climbing stairs? 0 1  Dressing or bathing? 0 0  Doing errands, shopping? 0 0  Preparing Food and eating ? N   Using the Toilet? N   In the past six months, have you accidently leaked urine? N   Do you have problems with loss of bowel control? N   Managing your Medications? N   Managing your Finances? N   Housekeeping or managing your Housekeeping? N     Patient Care Team: Jearld Fenton, NP as PCP - General (Internal Medicine) Kate Sable, MD as PCP - Cardiology (Cardiology) Malfi, Lupita Raider, FNP (Family Medicine)  Indicate any recent Medical Services you may have received from other than [facility] providers in the past year (date may be approximate).     Assessment:   This is a routine wellness examination for Kristin Porter.  Hearing/Vision screen Hearing Screening - Comments:: No aids Vision Screening - Comments:: Wears glasses- Dr.Neuman in [place] issues and exercise  activities discussed: Current Exercise Habits: Home exercise routine, Type of exercise: walking, Time (Minutes): 20, Frequency (Times/Week): 2, Weekly Exercise (Minutes/Week): 40, Intensity: Mild   Goals Addressed             This Visit's Progress    DIET - EAT MORE FRUITS AND VEGETABLES         Depression Screen    10/20/2021    3:03 PM 06/21/2021    1:09 PM 06/15/2021   11:03 AM 12/12/2020   10:10 AM 10/18/2020    2:57 PM 06/29/2020    1:58 PM 04/21/2020   10:39 AM  PHQ 2/9 Scores  PHQ - 2 Score 1 0 1 2 0 0 0  PHQ- 9 Score '2  5 7       ' Fall Risk    10/20/2021    3:06 PM 08/09/2021   11:22 AM 06/21/2021    1:09 PM 05/30/2021   11:36 AM 03/02/2021   10:59 AM  Fall Risk   Falls in the past year? 0 0 0 0 0  Number falls in past yr: 0 0 0    Injury with Fall? 0  0    Risk for fall due to : No Fall Risks No Fall Risks     Follow up Falls evaluation completed Falls evaluation completed       [place]:  Any stairs in or around the home? No  If so, are there any without handrails? No  Home free of loose throw rugs in walkways, pet beds, electrical cords, etc? Yes  Adequate lighting in your home to reduce risk of falls? Yes   ASSISTIVE DEVICES UTILIZED TO PREVENT FALLS:  Life alert? No  Use of a cane, walker or w/c? Yes  Grab bars in the bathroom? Yes  Shower chair or bench in shower? No  Elevated toilet seat or a handicapped toilet? No    Cognitive Function: declined test        10/18/2020    2:59 PM  6CIT Screen  What Year? 0 points  What month? 0 points  What time? 0 points  Count back from 20 0 points  Months in reverse 0 points  Repeat phrase 4 points  Total Score 4 points    Immunizations Immunization History  Administered Date(s) Administered   Influenza-Unspecified 02/26/2011, 02/21/2012   Tdap 09/04/2007, 04/24/2018    TDAP status: Up to date  Flu Vaccine status: Declined, Education has been provided regarding the  importance of this vaccine but patient still declined. Advised may receive this vaccine at local pharmacy or Health Dept. Aware to provide a copy of the vaccination record if obtained from local pharmacy or Health Dept. Verbalized acceptance and understanding.  Pneumococcal vaccine status: Declined,  Education has been provided regarding the importance of this vaccine but patient still declined. Advised may receive this vaccine at local pharmacy or Health Dept. Aware to provide a copy of the vaccination record if obtained from local pharmacy or Health Dept. Verbalized acceptance and understanding.   Covid-19 vaccine status: Declined, Education has been provided regarding the importance of this vaccine but patient still declined. Advised may receive this vaccine at local pharmacy or Health Dept.or vaccine clinic. Aware to provide a copy of the vaccination record if obtained from local pharmacy or Health Dept. Verbalized acceptance and understanding.  Qualifies for Shingles Vaccine? Yes   Zostavax completed No   Shingrix Completed?: No.    Education has been provided regarding the importance of this vaccine. Patient has been advised to call insurance company to determine out of pocket expense if they have not yet received this vaccine. Advised may also receive vaccine at local pharmacy or Health Dept. Verbalized acceptance and understanding.  Screening Tests Health Maintenance  Topic Date Due   COVID-19 Vaccine (1) Never done   Zoster Vaccines- Shingrix (1 of 2) Never done   INFLUENZA VACCINE  12/19/2021   PAP SMEAR-Modifier  09/17/2022   MAMMOGRAM  03/03/2023   TETANUS/TDAP  04/24/2028   COLONOSCOPY (Pts 45-42yr Insurance coverage will need to be confirmed)  03/09/2029   Hepatitis C Screening  Completed   HIV Screening  Completed   HPV VACCINES  Aged Out    Health Maintenance  Health Maintenance Due  Topic Date Due   COVID-19 Vaccine (1) Never done   Zoster Vaccines- Shingrix (1 of 2)  Never done    Colorectal cancer screening: Type of screening: Colonoscopy. Completed 03/10/19. Repeat every 10 years  Mammogram status: Completed 06/26/21. Repeat every year  Bone Density status: Completed 09/28/19. Results reflect: Bone density results: NORMAL. Repeat every 5 years.  Lung Cancer Screening: (Low Dose CT Chest recommended if Age 73-80years, 30 pack-year currently smoking OR have quit w/in 15years.) does not qualify.   Additional Screening:  Hepatitis C Screening: does qualify; Completed 09/17/19  Vision Screening: Recommended annual ophthalmology exams for early detection of glaucoma and other disorders of the eye. Is the patient up to date with their annual eye exam?  Yes  Who is the provider or what is the name of the office in which the patient attends annual eye exams? [place] is not established with a provider, would they like to be referred to a provider to establish care? No .   Dental Screening: Recommended annual dental exams for proper oral hygiene  Community Resource Referral / Chronic Care Management: CRR required this visit?  No   CCM required this visit?  No      Plan:     I have personally reviewed and noted the following in the patient's chart:   Medical and social history Use of alcohol, tobacco or illicit drugs  Current medications and supplements including opioid  prescriptions.  Functional ability and status Nutritional status Physical activity Advanced directives List of other physicians Hospitalizations, surgeries, and ER visits in previous 12 months Vitals Screenings to include cognitive, depression, and falls Referrals and appointments  In addition, I have reviewed and discussed with patient certain preventive protocols, quality metrics, and best practice recommendations. A written personalized care plan for preventive services as well as general preventive health recommendations were provided to patient.      Dionisio David, LPN   08/21/2001   Nurse Notes: none

## 2021-10-20 NOTE — Patient Instructions (Signed)
Kristin Porter , Thank you for taking time to come for your Medicare Wellness Visit. I appreciate your ongoing commitment to your health goals. Please review the following plan we discussed and let me know if I can assist you in the future.   Screening recommendations/referrals: Colonoscopy: 03/10/19 Mammogram: 06/26/21 Bone Density: 09/28/19 Recommended yearly ophthalmology/optometry visit for glaucoma screening and checkup Recommended yearly dental visit for hygiene and checkup  Vaccinations: Influenza vaccine: n/d Pneumococcal vaccine: n/d Tdap vaccine: 04/24/18 Shingles vaccine: n/d   Covid-19:n/d  Advanced directives: no  Conditions/risks identified: none  Next appointment: Follow up in one year for your annual wellness visit 10/26/22 @ 11:15am by phone   Preventive Care 65 Years and Older, Female Preventive care refers to lifestyle choices and visits with your health care provider that can promote health and wellness. What does preventive care include? A yearly physical exam. This is also called an annual well check. Dental exams once or twice a year. Routine eye exams. Ask your health care provider how often you should have your eyes checked. Personal lifestyle choices, including: Daily care of your teeth and gums. Regular physical activity. Eating a healthy diet. Avoiding tobacco and drug use. Limiting alcohol use. Practicing safe sex. Taking low-dose aspirin every day. Taking vitamin and mineral supplements as recommended by your health care provider. What happens during an annual well check? The services and screenings done by your health care provider during your annual well check will depend on your age, overall health, lifestyle risk factors, and family history of disease. Counseling  Your health care provider may ask you questions about your: Alcohol use. Tobacco use. Drug use. Emotional well-being. Home and relationship well-being. Sexual activity. Eating  habits. History of falls. Memory and ability to understand (cognition). Work and work Statistician. Reproductive health. Screening  You may have the following tests or measurements: Height, weight, and BMI. Blood pressure. Lipid and cholesterol levels. These may be checked every 5 years, or more frequently if you are over 2 years old. Skin check. Lung cancer screening. You may have this screening every year starting at age 19 if you have a 30-pack-year history of smoking and currently smoke or have quit within the past 15 years. Fecal occult blood test (FOBT) of the stool. You may have this test every year starting at age 85. Flexible sigmoidoscopy or colonoscopy. You may have a sigmoidoscopy every 5 years or a colonoscopy every 10 years starting at age 29. Hepatitis C blood test. Hepatitis B blood test. Sexually transmitted disease (STD) testing. Diabetes screening. This is done by checking your blood sugar (glucose) after you have not eaten for a while (fasting). You may have this done every 1-3 years. Bone density scan. This is done to screen for osteoporosis. You may have this done starting at age 74. Mammogram. This may be done every 1-2 years. Talk to your health care provider about how often you should have regular mammograms. Talk with your health care provider about your test results, treatment options, and if necessary, the need for more tests. Vaccines  Your health care provider may recommend certain vaccines, such as: Influenza vaccine. This is recommended every year. Tetanus, diphtheria, and acellular pertussis (Tdap, Td) vaccine. You may need a Td booster every 10 years. Zoster vaccine. You may need this after age 64. Pneumococcal 13-valent conjugate (PCV13) vaccine. One dose is recommended after age 50. Pneumococcal polysaccharide (PPSV23) vaccine. One dose is recommended after age 48. Talk to your health care provider about which  screenings and vaccines you need and how  often you need them. This information is not intended to replace advice given to you by your health care provider. Make sure you discuss any questions you have with your health care provider. Document Released: 06/03/2015 Document Revised: 01/25/2016 Document Reviewed: 03/08/2015 Elsevier Interactive Patient Education  2017 [employer] Prevention in the Home Falls can cause injuries. They can happen to people of all ages. There are many things you can do to make your home safe and to help prevent falls. What can I do on the outside of my home? Regularly fix the edges of walkways and driveways and fix any cracks. Remove anything that might make you trip as you walk through a door, such as a raised step or threshold. Trim any bushes or trees on the path to your home. Use bright outdoor lighting. Clear any walking paths of anything that might make someone trip, such as rocks or tools. Regularly check to see if handrails are loose or broken. Make sure that both sides of any steps have handrails. Any raised decks and porches should have guardrails on the edges. Have any leaves, snow, or ice cleared regularly. Use sand or salt on walking paths during winter. Clean up any spills in your garage right away. This includes oil or grease spills. What can I do in the bathroom? Use night lights. Install grab bars by the toilet and in the tub and shower. Do not use towel bars as grab bars. Use non-skid mats or decals in the tub or shower. If you need to sit down in the shower, use a plastic, non-slip stool. Keep the floor dry. Clean up any water that spills on the floor as soon as it happens. Remove soap buildup in the tub or shower regularly. Attach bath mats securely with double-sided non-slip rug tape. Do not have throw rugs and other things on the floor that can make you trip. What can I do in the bedroom? Use night lights. Make sure that you have a light by your bed that is easy to  reach. Do not use any sheets or blankets that are too big for your bed. They should not hang down onto the floor. Have a firm chair that has side arms. You can use this for support while you get dressed. Do not have throw rugs and other things on the floor that can make you trip. What can I do in the kitchen? Clean up any spills right away. Avoid walking on wet floors. Keep items that you use a lot in easy-to-reach places. If you need to reach something above you, use a strong step stool that has a grab bar. Keep electrical cords out of the way. Do not use floor polish or wax that makes floors slippery. If you must use wax, use non-skid floor wax. Do not have throw rugs and other things on the floor that can make you trip. What can I do with my stairs? Do not leave any items on the stairs. Make sure that there are handrails on both sides of the stairs and use them. Fix handrails that are broken or loose. Make sure that handrails are as long as the stairways. Check any carpeting to make sure that it is firmly attached to the stairs. Fix any carpet that is loose or worn. Avoid having throw rugs at the top or bottom of the stairs. If you do have throw rugs, attach them to the floor with carpet  tape. Make sure that you have a light switch at the top of the stairs and the bottom of the stairs. If you do not have them, ask someone to add them for you. What else can I do to help prevent falls? Wear shoes that: Do not have high heels. Have rubber bottoms. Are comfortable and fit you well. Are closed at the toe. Do not wear sandals. If you use a stepladder: Make sure that it is fully opened. Do not climb a closed stepladder. Make sure that both sides of the stepladder are locked into place. Ask someone to hold it for you, if possible. Clearly mark and make sure that you can see: Any grab bars or handrails. First and last steps. Where the edge of each step is. Use tools that help you move  around (mobility aids) if they are needed. These include: Canes. Walkers. Scooters. Crutches. Turn on the lights when you go into a dark area. Replace any light bulbs as soon as they burn out. Set up your furniture so you have a clear path. Avoid moving your furniture around. If any of your floors are uneven, fix them. If there are any pets around you, be aware of where they are. Review your medicines with your doctor. Some medicines can make you feel dizzy. This can increase your chance of falling. Ask your doctor what other things that you can do to help prevent falls. This information is not intended to replace advice given to you by your health care provider. Make sure you discuss any questions you have with your health care provider. Document Released: 03/03/2009 Document Revised: 10/13/2015 Document Reviewed: 06/11/2014 Elsevier Interactive Patient Education  2017 [employer].

## 2021-10-24 ENCOUNTER — Ambulatory Visit: Payer: Medicare Other

## 2021-10-24 ENCOUNTER — Encounter: Payer: Medicare Other | Admitting: Student in an Organized Health Care Education/Training Program

## 2021-10-25 ENCOUNTER — Ambulatory Visit
Payer: Medicare Other | Attending: Student in an Organized Health Care Education/Training Program | Admitting: Student in an Organized Health Care Education/Training Program

## 2021-10-25 ENCOUNTER — Encounter: Payer: Self-pay | Admitting: Student in an Organized Health Care Education/Training Program

## 2021-10-25 VITALS — BP 138/93 | HR 80 | Temp 97.5°F | Resp 16 | Ht 59.0 in | Wt 203.0 lb

## 2021-10-25 DIAGNOSIS — M25552 Pain in left hip: Secondary | ICD-10-CM | POA: Insufficient documentation

## 2021-10-25 DIAGNOSIS — G8929 Other chronic pain: Secondary | ICD-10-CM | POA: Diagnosis not present

## 2021-10-25 DIAGNOSIS — M47817 Spondylosis without myelopathy or radiculopathy, lumbosacral region: Secondary | ICD-10-CM | POA: Insufficient documentation

## 2021-10-25 DIAGNOSIS — M25551 Pain in right hip: Secondary | ICD-10-CM | POA: Diagnosis not present

## 2021-10-25 DIAGNOSIS — M47816 Spondylosis without myelopathy or radiculopathy, lumbar region: Secondary | ICD-10-CM | POA: Insufficient documentation

## 2021-10-25 DIAGNOSIS — M7918 Myalgia, other site: Secondary | ICD-10-CM | POA: Diagnosis not present

## 2021-10-25 DIAGNOSIS — G950 Syringomyelia and syringobulbia: Secondary | ICD-10-CM | POA: Insufficient documentation

## 2021-10-25 DIAGNOSIS — M797 Fibromyalgia: Secondary | ICD-10-CM | POA: Insufficient documentation

## 2021-10-25 DIAGNOSIS — M545 Low back pain, unspecified: Secondary | ICD-10-CM | POA: Diagnosis not present

## 2021-10-25 DIAGNOSIS — G894 Chronic pain syndrome: Secondary | ICD-10-CM | POA: Insufficient documentation

## 2021-10-25 MED ORDER — HYDROCODONE-ACETAMINOPHEN 7.5-325 MG PO TABS
1.0000 | ORAL_TABLET | Freq: Three times a day (TID) | ORAL | 0 refills | Status: AC | PRN
Start: 2021-10-25 — End: 2021-11-24

## 2021-10-25 MED ORDER — PREGABALIN 100 MG PO CAPS: 100.0000 mg | ORAL_CAPSULE | Freq: Every day | ORAL | 5 refills | Status: DC

## 2021-10-25 MED ORDER — HYDROCODONE-ACETAMINOPHEN 7.5-325 MG PO TABS: 1.0000 | ORAL_TABLET | Freq: Three times a day (TID) | ORAL | 0 refills | Status: AC | PRN

## 2021-10-25 MED ORDER — HYDROCODONE-ACETAMINOPHEN 7.5-325 MG PO TABS
1.0000 | ORAL_TABLET | Freq: Three times a day (TID) | ORAL | 0 refills | Status: AC | PRN
Start: 2021-11-24 — End: 2021-12-24

## 2021-10-25 NOTE — Progress Notes (Signed)
Nursing Pain Medication Assessment:  Safety precautions to be maintained throughout the outpatient stay will include: orient to surroundings, keep bed in low position, maintain call bell within reach at all times, provide assistance with transfer out of bed and ambulation.  Medication Inspection Compliance: Pill count conducted under aseptic conditions, in front of the patient. Neither the pills nor the bottle was removed from the patient's sight at any time. Once count was completed pills were immediately returned to the patient in their original bottle.  Medication: Tramadol (Ultram) Pill/Patch Count:  71 of 120 pills remain Pill/Patch Appearance: Markings consistent with prescribed medication Bottle Appearance: Standard pharmacy container. Clearly labeled. Filled Date: 04 / 18 / 2023 Last Medication intake:  Yesterday Safety precautions to be maintained throughout the outpatient stay will include: orient to surroundings, keep bed in low position, maintain call bell within reach at all times, provide assistance with transfer out of bed and ambulation.

## 2021-10-25 NOTE — Progress Notes (Signed)
PROVIDER NOTE: Information contained herein reflects review and annotations entered in association with encounter. Interpretation of such information and data should be left to medically-trained personnel. Information provided to patient can be located elsewhere in the medical record under "Patient Instructions". Document created using STT-dictation technology, any transcriptional errors that may result from process are unintentional.    Patient: Kristin Porter  Service Category: E/M  Provider: Gillis Santa, MD  DOB: [birth year]  DOS: 10/25/2021  Specialty: Interventional Pain Management  MRN: [id number]  Setting: Ambulatory outpatient  PCP: Jearld Fenton, NP  Type: Established Patient    Referring Provider: Jearld Fenton, NP  Location: Office  Delivery: Face-to-face     HPI  [name], a 61 y.o. year old female, is here today because of her Lumbosacral spondylosis without myelopathy [M47.817]. Ms. [name] primary complain today is Back Pain (lower) Last encounter: My last encounter with her was on 08/31/21 Pertinent problems: Ms. [name] has Fibromyalgia; Lumbosacral spondylosis without myelopathy; Chronic pain syndrome; and Lumbar facet joint syndrome on their pertinent problem list. Pain Assessment: Severity of Chronic pain is reported as a 7 /10. Location: Back Lower/both legs to the feet. Onset: More than a month ago. Quality: Dull, Aching, Stabbing, Sharp, Throbbing. Timing: Constant. Modifying factor(s): sitting/rest. Vitals:  height is '4\' 11"'  (1.499 m) and weight is 203 lb (92.1 kg). Her temporal temperature is 97.5 F (36.4 C) (abnormal). Her blood pressure is 138/93 (abnormal) and her pulse is 80. Her respiration is 16 and oxygen saturation is 98%.   Reason for encounter:   Patient follows up today for medication management.  No significant change in her medical history since her last clinic visit with me.  She continues to have persistent axial low back pain related to  lumbar facet arthropathy and lumbar spondylosis.  She is having limited response with tramadol at his current dose.  She is wondering if she can transition back to hydrocodone which she was on previously.  She was having some GI side effects with her previous dose I recommend a dose reduction to 7.5 mg to see if she tolerates that better and if that provides better analgesic benefit.   Pharmacotherapy Assessment  Analgesic: Discontinue tramadol, start hydrocodone as below.  Monitoring:  PMP: PDMP reviewed during this encounter.       Pharmacotherapy: No side-effects or adverse reactions reported. Compliance: No problems identified. Effectiveness: Clinically acceptable.  Kristin Martins, RN  10/25/2021  2:09 PM  Sign when Signing Visit Nursing Pain Medication Assessment:  Safety precautions to be maintained throughout the outpatient stay will include: orient to surroundings, keep bed in low position, maintain call bell within reach at all times, provide assistance with transfer out of bed and ambulation.  Medication Inspection Compliance: Pill count conducted under aseptic conditions, in front of the patient. Neither the pills nor the bottle was removed from the patient's sight at any time. Once count was completed pills were immediately returned to the patient in their original bottle.  Medication: Tramadol (Ultram) Pill/Patch Count:  71 of 120 pills remain Pill/Patch Appearance: Markings consistent with prescribed medication Bottle Appearance: Standard pharmacy container. Clearly labeled. Filled Date: 04 / 18 / 2023 Last Medication intake:  Yesterday Safety precautions to be maintained throughout the outpatient stay will include: orient to surroundings, keep bed in low position, maintain call bell within reach at all times, provide assistance with transfer out of bed and ambulation.    UDS:  Summary  Date Value Ref  Range Status  10/13/2020 Note  Final    Comment:     ==================================================================== [employer] [facility] 13 (MW) ==================================================================== Test                             Result       Flag       Units  Drug Present and Declared for Prescription Verification   Hydrocodone                    697          EXPECTED   ng/mg creat   Hydromorphone                  365          EXPECTED   ng/mg creat   Dihydrocodeine                 240          EXPECTED   ng/mg creat   Norhydrocodone                 620          EXPECTED   ng/mg creat    Sources of hydrocodone include scheduled prescription medications.    Hydromorphone, dihydrocodeine and norhydrocodone are expected    metabolites of hydrocodone. Hydromorphone and dihydrocodeine are    also available as scheduled prescription medications.  Drug Present not Declared for Prescription Verification   Oxazepam                       21           UNEXPECTED ng/mg creat   Temazepam                      15           UNEXPECTED ng/mg creat    Oxazepam and temazepam are expected metabolites of diazepam.    Oxazepam is also an expected metabolite of other benzodiazepine    drugs, including chlordiazepoxide, prazepam, clorazepate, halazepam,    and temazepam.  Oxazepam and temazepam are available as scheduled    prescription medications.  ==================================================================== Test                      Result    Flag   Units      Ref Range   Creatinine              149              mg/dL      >=20 ==================================================================== Declared Medications:  The flagging and interpretation on this report are based on the  following declared medications.  Unexpected results may arise from  inaccuracies in the declared medications.   **Note: The testing scope of this panel includes these medications:   Hydrocodone   **Note: The testing scope of this panel does not  include the  following reported medications:   Acetaminophen  Albuterol  Amlodipine  Baclofen  Evolocumab (Repatha)  Fluticasone  Hydrochlorothiazide  Meloxicam  Omeprazole  Paroxetine  Pregabalin ==================================================================== For clinical consultation, please call [phone]. ====================================================================      ROS  Constitutional: Denies any fever or chills Gastrointestinal: No reported hemesis, hematochezia, vomiting, or acute GI distress Musculoskeletal:  Low back pain, worse with facet loading and lumbar extension Neurological: No reported episodes of acute  onset apraxia, aphasia, dysarthria, agnosia, amnesia, paralysis, loss of coordination, or loss of consciousness  Medication Review  Blood Pressure Kit, Evolocumab, HYDROcodone-acetaminophen, PARoxetine, Suvorexant, albuterol, amLODipine, aspirin EC, baclofen, fluticasone, meloxicam, omeprazole, pregabalin, traMADol, and triamcinolone cream  History Review  Allergy: Ms. Riera is allergic to tizanidine, lisinopril, and tramadol. Drug: Ms. Rayfield  reports no history of drug use. Alcohol:  reports that she does not currently use alcohol. Tobacco:  reports that she quit smoking about 53 years ago. Her smoking use included cigarettes. She has never used smokeless tobacco. Social: Ms. Odonnel  reports that she quit smoking about 53 years ago. Her smoking use included cigarettes. She has never used smokeless tobacco. She reports that she does not currently use alcohol. She reports that she does not use drugs. Medical:  has a past medical history of Arthritis, Fibromyalgia, Hypertension, and Neuropathy. Surgical: Ms. Vanduzer  has a past surgical history that includes Breast biopsy (Right, 2018); Esophagogastroduodenoscopy (egd) with propofol (N/A, 02/22/2021); and Breast biopsy (Left, 07/12/2021). Family: Family history is unknown by  patient.  Laboratory Chemistry Profile   Renal Lab Results  Component Value Date   BUN 11 12/12/2020   CREATININE 0.63 34/74/2595   BCR NOT APPLICABLE 63/87/5643   GFRAA 107 03/22/2020   GFRNONAA 92 03/22/2020    Hepatic Lab Results  Component Value Date   AST 17 12/12/2020   ALT 16 12/12/2020    Electrolytes Lab Results  Component Value Date   NA 141 12/12/2020   K 4.4 12/12/2020   CL 107 12/12/2020   CALCIUM 9.1 12/12/2020    Bone No results found for: VD25OH, VD125OH2TOT, PI9518AC1, YS0630ZS0, 25OHVITD1, 25OHVITD2, 25OHVITD3, TESTOFREE, TESTOSTERONE  Inflammation (CRP: Acute Phase) (ESR: Chronic Phase) Lab Results  Component Value Date   CRP 15.2 (H) 12/12/2020   ESRSEDRATE 34 (H) 12/12/2020         Note: Above Lab results reviewed.  Recent Imaging Review  US BREAST LTD UNI LEFT INC AXILLA CLINICAL DATA:  61 year old with a spontaneous bloody LEFT nipple discharge. Recent diagnostic MRI demonstrated an enhancing 6 mm mass in the upper-outer retroareolar LEFT breast. MRI-directed second-look ultrasound is performed to determine if there is a sonographic correlate that could potentially be biopsied.  She had a benign core needle biopsy of the inner LEFT breast at the 9 o'clock location on 07/12/2021, pathology demonstrating duct ectasia and apocrine metaplasia.  EXAM: ULTRASOUND OF THE LEFT BREAST  COMPARISON:  None Available.  FINDINGS: Targeted ultrasound is performed in the upper retroareolar location, and there is no sonographic correlate for the mass identified on MRI.  IMPRESSION: No sonographic correlate for the 6 mm mass identified on MRI in the upper-outer retroareolar LEFT breast.  RECOMMENDATION: MRI guided core needle biopsy of the LEFT breast mass.  I have discussed the findings and recommendations with the patient.  BI-RADS CATEGORY  4: Suspicious.  Electronically Signed   By: Evangeline Dakin M.D.   On: 09/27/2021 10:28 Note:  Reviewed        Physical Exam  General appearance: Well nourished, well developed, and well hydrated. In no apparent acute distress Mental status: Alert, oriented x 3 (person, place, & time)       Respiratory: No evidence of acute respiratory distress Eyes: PERLA Vitals: BP (!) 138/93   Pulse 80   Temp (!) 97.5 F (36.4 C) (Temporal)   Resp 16   Ht '4\' 11"'  (1.499 m)   Wt 203 lb (92.1 kg)   SpO2 98%  BMI 41.00 kg/m  BMI: Estimated body mass index is 41 kg/m as calculated from the following:   Height as of this encounter: '4\' 11"'  (1.499 m).   Weight as of this encounter: 203 lb (92.1 kg). Ideal: Patient must be at least 60 in tall to calculate ideal body weight  Lumbar Spine Area Exam  Skin & Axial Inspection: No masses, redness, or swelling Alignment: Symmetrical Functional ROM: Pain restricted ROM affecting both sides Stability: No instability detected Muscle Tone/Strength: Functionally intact. No obvious neuro-muscular anomalies detected. Sensory (Neurological): Musculoskeletal pain pattern Palpation: No palpable anomalies       Provocative Tests: Hyperextension/rotation test: (+) bilaterally for facet joint pain.  Gait & Posture Assessment  Ambulation: Unassisted Gait: Relatively normal for age and body habitus Posture: WNL   Lower Extremity Exam    Side: Right lower extremity  Side: Left lower extremity  Stability: No instability observed          Stability: No instability observed          Skin & Extremity Inspection: Skin color, temperature, and hair growth are WNL. No peripheral edema or cyanosis. No masses, redness, swelling, asymmetry, or associated skin lesions. No contractures.  Skin & Extremity Inspection: Skin color, temperature, and hair growth are WNL. No peripheral edema or cyanosis. No masses, redness, swelling, asymmetry, or associated skin lesions. No contractures.  Functional ROM: Pain restricted ROM for hip joint          Functional ROM: Pain restricted  ROM for hip joint          Muscle Tone/Strength: Functionally intact. No obvious neuro-muscular anomalies detected.  Muscle Tone/Strength: Functionally intact. No obvious neuro-muscular anomalies detected.  Sensory (Neurological): Musculoskeletal pain pattern        Sensory (Neurological): Musculoskeletal pain pattern        DTR: Patellar: deferred today Achilles: deferred today Plantar: deferred today  DTR: Patellar: deferred today Achilles: deferred today Plantar: deferred today  Palpation: No palpable anomalies  Palpation: No palpable anomalies    Assessment   Status Diagnosis  Controlled Controlled Controlled 1. Lumbosacral spondylosis without myelopathy   2. Lumbar facet joint syndrome   3. Chronic bilateral low back pain without sciatica   4. Fibromyalgia   5. Syrinx of spinal cord ([place])   6. Chronic pain of both hips   7. Chronic pain syndrome   8. Myofascial pain syndrome of lumbar spine        Plan of Care    Ms. Teila O Hojnacki has a current medication list which includes the following long-term medication(s): albuterol, fluticasone, omeprazole, paroxetine, albuterol, amlodipine, omeprazole, and pregabalin.  Discontinue tramadol. Follow-up with PCP regarding elevated B12 levels.  Recommend repeating. We will obtain a UDS at next visit.   Pharmacotherapy (Medications Ordered): Meds ordered this encounter  Medications   HYDROcodone-acetaminophen (NORCO) 7.5-325 MG tablet    Sig: Take 1 tablet by mouth 3 (three) times daily as needed for severe pain. Must last 30 days    Dispense:  90 tablet    Refill:  0    Chronic Pain: STOP Act (Not applicable) Fill 1 day early if closed on refill date. Avoid benzodiazepines within 8 hours of opioids   HYDROcodone-acetaminophen (NORCO) 7.5-325 MG tablet    Sig: Take 1 tablet by mouth 3 (three) times daily as needed for severe pain. Must last 30 days    Dispense:  90 tablet    Refill:  0    Chronic Pain: STOP Act (  Not  applicable) Fill 1 day early if closed on refill date. Avoid benzodiazepines within 8 hours of opioids   HYDROcodone-acetaminophen (NORCO) 7.5-325 MG tablet    Sig: Take 1 tablet by mouth 3 (three) times daily as needed for severe pain. Must last 30 days    Dispense:  90 tablet    Refill:  0    Chronic Pain: STOP Act (Not applicable) Fill 1 day early if closed on refill date. Avoid benzodiazepines within 8 hours of opioids   pregabalin (LYRICA) 100 MG capsule    Sig: Take 1-2 capsules (100-200 mg total) by mouth at bedtime.    Dispense:  60 capsule    Refill:  5    Fill one day early if pharmacy is closed on scheduled refill date. May substitute for generic if available.     Follow-up plan:   Return in about 3 months (around 01/25/2022) for Medication Management, in person.    Recent Visits Date Type Provider Dept  08/31/21 Office Visit Gillis Santa, MD [facility]  08/09/21 Procedure visit Gillis Santa, MD [facility]  Showing recent visits within past 90 days and meeting all other requirements Today's Visits Date Type Provider Dept  10/25/21 Office Visit Gillis Santa, MD [facility]  Showing today's visits and meeting all other requirements Future Appointments Date Type Provider Dept  01/18/22 Appointment Gillis Santa, MD [facility]  Showing future appointments within next 90 days and meeting all other requirements  I discussed the assessment and treatment plan with the patient. The patient was provided an opportunity to ask questions and all were answered. The patient agreed with the plan and demonstrated an understanding of the instructions.  Patient advised to call back or seek an in-person evaluation if the symptoms or condition worsens.  Duration of encounter: 22mnutes.  Note by: BGillis Santa MD Date: 10/25/2021; Time: 3:02 PM

## 2021-10-26 ENCOUNTER — Ambulatory Visit
Admission: RE | Admit: 2021-10-26 | Discharge: 2021-10-26 | Disposition: A | Discharge status: Home / Self Care | Payer: Medicare Other | Source: Ambulatory Visit | Attending: Internal Medicine | Admitting: Internal Medicine

## 2021-10-26 DIAGNOSIS — N6320 Unspecified lump in the left breast, unspecified quadrant: Secondary | ICD-10-CM

## 2021-10-26 DIAGNOSIS — D242 Benign neoplasm of left breast: Secondary | ICD-10-CM | POA: Diagnosis not present

## 2021-10-26 DIAGNOSIS — N6012 Diffuse cystic mastopathy of left breast: Secondary | ICD-10-CM | POA: Diagnosis not present

## 2021-10-26 DIAGNOSIS — N6321 Unspecified lump in the left breast, upper outer quadrant: Secondary | ICD-10-CM | POA: Diagnosis not present

## 2021-10-26 MED ORDER — GADOBUTROL 1 MMOL/ML IV SOLN
9.0000 mL | Freq: Once | INTRAVENOUS | Status: AC | PRN
  Administered 2021-10-26: 9 mL via INTRAVENOUS

## 2021-10-30 ENCOUNTER — Encounter: Payer: Self-pay | Admitting: *Deleted

## 2021-10-30 NOTE — Progress Notes (Signed)
Referral recieved from [facility] for benign breast mass.  Appointment scheduled for surgical consultation with Dr. Bary Castilla per patient request on 11/07/21.   No further needs at this time.

## 2021-11-03 DIAGNOSIS — G8929 Other chronic pain: Secondary | ICD-10-CM | POA: Diagnosis not present

## 2021-11-03 DIAGNOSIS — M47817 Spondylosis without myelopathy or radiculopathy, lumbosacral region: Secondary | ICD-10-CM | POA: Diagnosis not present

## 2021-11-03 DIAGNOSIS — M545 Low back pain, unspecified: Secondary | ICD-10-CM | POA: Diagnosis not present

## 2021-11-03 DIAGNOSIS — R2681 Unsteadiness on feet: Secondary | ICD-10-CM | POA: Diagnosis not present

## 2021-11-07 DIAGNOSIS — D242 Benign neoplasm of left breast: Secondary | ICD-10-CM | POA: Diagnosis not present

## 2021-11-07 DIAGNOSIS — N6452 Nipple discharge: Secondary | ICD-10-CM | POA: Diagnosis not present

## 2021-11-14 ENCOUNTER — Encounter: Payer: Medicare Other | Admitting: Student in an Organized Health Care Education/Training Program

## 2022-01-18 ENCOUNTER — Encounter: Payer: Self-pay | Admitting: Student in an Organized Health Care Education/Training Program

## 2022-01-18 ENCOUNTER — Ambulatory Visit
Payer: Medicare Other | Attending: Student in an Organized Health Care Education/Training Program | Admitting: Student in an Organized Health Care Education/Training Program

## 2022-01-18 ENCOUNTER — Other Ambulatory Visit: Payer: Self-pay | Admitting: Student in an Organized Health Care Education/Training Program

## 2022-01-18 VITALS — BP 146/90 | HR 70 | Temp 97.5°F | Resp 16 | Ht 59.0 in | Wt 198.0 lb

## 2022-01-18 DIAGNOSIS — M25551 Pain in right hip: Secondary | ICD-10-CM | POA: Diagnosis not present

## 2022-01-18 DIAGNOSIS — G950 Syringomyelia and syringobulbia: Secondary | ICD-10-CM | POA: Diagnosis not present

## 2022-01-18 DIAGNOSIS — G8929 Other chronic pain: Secondary | ICD-10-CM

## 2022-01-18 DIAGNOSIS — M545 Low back pain, unspecified: Secondary | ICD-10-CM | POA: Diagnosis not present

## 2022-01-18 DIAGNOSIS — M47816 Spondylosis without myelopathy or radiculopathy, lumbar region: Secondary | ICD-10-CM | POA: Diagnosis not present

## 2022-01-18 DIAGNOSIS — M47817 Spondylosis without myelopathy or radiculopathy, lumbosacral region: Secondary | ICD-10-CM

## 2022-01-18 DIAGNOSIS — M797 Fibromyalgia: Secondary | ICD-10-CM

## 2022-01-18 DIAGNOSIS — M25552 Pain in left hip: Secondary | ICD-10-CM

## 2022-01-18 DIAGNOSIS — G894 Chronic pain syndrome: Secondary | ICD-10-CM | POA: Diagnosis not present

## 2022-01-18 MED ORDER — BELBUCA 150 MCG BU FILM: 1.0000 | ORAL_FILM | Freq: Two times a day (BID) | BUCCAL | 1 refills | Status: AC

## 2022-01-18 NOTE — Progress Notes (Signed)
Safety precautions to be maintained throughout the outpatient stay will include: orient to surroundings, keep bed in low position, maintain call bell within reach at all times, provide assistance with transfer out of bed and ambulation.  

## 2022-01-18 NOTE — Progress Notes (Signed)
PROVIDER NOTE: Information contained herein reflects review and annotations entered in association with encounter. Interpretation of such information and data should be left to medically-trained personnel. Information provided to patient can be located elsewhere in the medical record under "Patient Instructions". Document created using STT-dictation technology, any transcriptional errors that may result from process are unintentional.    Patient: Kristin Porter  Service Category: E/M  Provider: Gillis Santa, MD  DOB: [birth year]  DOS: 01/18/2022  Specialty: Interventional Pain Management  MRN: [id number]  Setting: Ambulatory outpatient  PCP: Jearld Fenton, NP  Type: Established Patient    Referring Provider: Jearld Fenton, NP  Location: Office  Delivery: Face-to-face     HPI  [name], a 61 y.o. year old female, is here today because of her Lumbosacral spondylosis without myelopathy [M47.817]. [name] primary complain today is Pain (R/t fibromyalgia) Last encounter: My last encounter with her was on 10/25/2021 Pertinent problems: [name] has Fibromyalgia; Lumbosacral spondylosis without myelopathy; Chronic pain syndrome; and Lumbar facet joint syndrome on their pertinent problem list. Pain Assessment: Severity of Chronic pain is reported as a 6 /10. Location: Other (Comment) (generalized pain, back is worst) Other (Comment) (generalized)/ . Onset: More than a month ago. Quality: Sore, Throbbing. Timing: Constant. Modifying factor(s): Lyrica, baclofen. Vitals:  height is '4\' 11"'  (1.499 m) and weight is 198 lb (89.8 kg). Her temporal temperature is 97.5 F (36.4 C) (abnormal). Her blood pressure is 146/90 (abnormal) and her pulse is 70. Her respiration is 16 and oxygen saturation is 99%.   Reason for encounter:   Patient follows up today for medication management.  No significant change in her medical history since her last clinic visit with me.  She continues to have  persistent axial low back pain related to lumbar facet arthropathy and lumbar spondylosis.  She is having limited response with hydrocodone at his current dose.  She has tried to participate in physical therapy but states that it really increased her pain.  I encouraged her to continue with home stretching exercises.  Of note, the patient has tried hydrocodone, tramadol, oxycodone as well as Butrans with limited response.  She states that Butrans was the most effective out of all her medication trials but it did result in cognitive and mood side effects of depression and sadness.  We discussed trial of belbuca to see if this provides analgesic benefit without the side effects that she noticed with Butrans.  Risk and benefits reviewed and patient would like to proceed.   Pharmacotherapy Assessment  Analgesic: Trial of belbuca as below  Monitoring:  PMP: PDMP reviewed during this encounter.       Pharmacotherapy: No side-effects or adverse reactions reported. Compliance: No problems identified. Effectiveness: Clinically acceptable.  Rise Patience, RN  01/18/2022  1:38 PM  Sign when Signing Visit Safety precautions to be maintained throughout the outpatient stay will include: orient to surroundings, keep bed in low position, maintain call bell within reach at all times, provide assistance with transfer out of bed and ambulation.    UDS:  Summary  Date Value Ref Range Status  10/13/2020 Note  Final    Comment:    ==================================================================== [employer] (MW) ==================================================================== Test                             Result       Flag       Units  Drug Present  and Declared for Prescription Verification   Hydrocodone                    697          EXPECTED   ng/mg creat   Hydromorphone                  365          EXPECTED   ng/mg creat   Dihydrocodeine                 240          EXPECTED   ng/mg  creat   Norhydrocodone                 620          EXPECTED   ng/mg creat    Sources of hydrocodone include scheduled prescription medications.    Hydromorphone, dihydrocodeine and norhydrocodone are expected    metabolites of hydrocodone. Hydromorphone and dihydrocodeine are    also available as scheduled prescription medications.  Drug Present not Declared for Prescription Verification   Oxazepam                       21           UNEXPECTED ng/mg creat   Temazepam                      15           UNEXPECTED ng/mg creat    Oxazepam and temazepam are expected metabolites of diazepam.    Oxazepam is also an expected metabolite of other benzodiazepine    drugs, including chlordiazepoxide, prazepam, clorazepate, halazepam,    and temazepam.  Oxazepam and temazepam are available as scheduled    prescription medications.  ==================================================================== Test                      Result    Flag   Units      Ref Range   Creatinine              149              mg/dL      >=20 ==================================================================== Declared Medications:  The flagging and interpretation on this report are based on the  following declared medications.  Unexpected results may arise from  inaccuracies in the declared medications.   **Note: The testing scope of this panel includes these medications:   Hydrocodone   **Note: The testing scope of this panel does not include the  following reported medications:   Acetaminophen  Albuterol  Amlodipine  Baclofen  Evolocumab (Repatha)  Fluticasone  Hydrochlorothiazide  Meloxicam  Omeprazole  Paroxetine  Pregabalin ==================================================================== For clinical consultation, please call [phone]. ====================================================================      ROS  Constitutional: Denies any fever or chills Gastrointestinal: No reported  hemesis, hematochezia, vomiting, or acute GI distress Musculoskeletal:  Low back pain, worse with facet loading and lumbar extension Neurological: No reported episodes of acute onset apraxia, aphasia, dysarthria, agnosia, amnesia, paralysis, loss of coordination, or loss of consciousness  Medication Review  Blood Pressure Kit, Buprenorphine HCl, Evolocumab, HYDROcodone-acetaminophen, PARoxetine, Suvorexant, albuterol, amLODipine, aspirin EC, baclofen, fluticasone, meloxicam, omeprazole, pregabalin, and triamcinolone cream  History Review  Allergy: [name] is allergic to tizanidine, lisinopril, and tramadol. Drug: [name]  reports no history of drug use. Alcohol:  reports that  she does not currently use alcohol. Tobacco:  reports that she quit smoking about 53 years ago. Her smoking use included cigarettes. She has never used smokeless tobacco. Social: [name]  reports that she quit smoking about 53 years ago. Her smoking use included cigarettes. She has never used smokeless tobacco. She reports that she does not currently use alcohol. She reports that she does not use drugs. Medical:  has a past medical history of Arthritis, Fibromyalgia, Hypertension, and Neuropathy. Surgical: [name]  has a past surgical history that includes Breast biopsy (Right, 2018); Esophagogastroduodenoscopy (egd) with propofol (N/A, 02/22/2021); and Breast biopsy (Left, 07/12/2021). Family: Family history is unknown by patient.  Laboratory Chemistry Profile   Renal Lab Results  Component Value Date   BUN 11 12/12/2020   CREATININE 0.63 62/95/2841   BCR NOT APPLICABLE 32/44/0102   GFRAA 107 03/22/2020   GFRNONAA 92 03/22/2020    Hepatic Lab Results  Component Value Date   AST 17 12/12/2020   ALT 16 12/12/2020    Electrolytes Lab Results  Component Value Date   NA 141 12/12/2020   K 4.4 12/12/2020   CL 107 12/12/2020   CALCIUM 9.1 12/12/2020    Bone No results found for: "VD25OH",  "VD125OH2TOT", "VO5366YQ0", "HK7425ZD6", "25OHVITD1", "25OHVITD2", "25OHVITD3", "TESTOFREE", "TESTOSTERONE"  Inflammation (CRP: Acute Phase) (ESR: Chronic Phase) Lab Results  Component Value Date   CRP 15.2 (H) 12/12/2020   ESRSEDRATE 34 (H) 12/12/2020         Note: Above Lab results reviewed.  Recent Imaging Review  MR LT BREAST BX W LOC DEV 1ST LESION IMAGE BX SPEC MR GUIDE Addendum: ADDENDUM REPORT: 10/31/2021 18:56   ADDENDUM:  Pathology revealed Breast, LEFT, needle core biopsy, anterior, upper  outer quadrant (barbell clip)- SCLEROSED INTRADUCTAL PAPILLOMA WITH  USUAL DUCTAL HYPERPLASIA AND CALCIFICATIONS AND ASSOCIATED BENIGN  BREAST TISSUE WITH DUCTULAR DILATATION/ECTASIA AND BACKGROUND  FIBROCYSTIC CHANGE WITH APOCRINE METAPLASIA. This was found to be  concordant by Dr. Claudie Revering, with surgical consultation for  excision recommended per protocol- given patient symptomatic and  age.   Pathology results were discussed with the patient by telephone. The  patient reported doing well after the biopsy with tenderness at the  site. Post biopsy instructions and care were reviewed and questions  were answered. The patient was encouraged to call [facility] for any additional concerns.   Per patient request, surgical consultation has been arranged with  Dr. Hervey Ard at [facility] on November 07, 2021.   Pathology results reported by Stacie Acres RN on 10/30/2021.   Electronically Signed    By: Claudie Revering M.D.    On: 10/31/2021 18:56 Narrative: CLINICAL DATA:  6 mm mass seen in the anterior upper outer left breast on a bilateral breast MRI performed for spontaneous bloody left nipple discharge.  EXAM: MRI GUIDED CORE NEEDLE BIOPSY OF THE LEFT BREAST  TECHNIQUE: Multiplanar, multisequence MR imaging of the left breast was performed both before and after administration of intravenous contrast.  CONTRAST:  9 cc  Gadavist  COMPARISON:  Bilateral breast MRI dated 08/16/2021  FINDINGS: I met with the patient, and we discussed the procedure of MRI guided biopsy, including risks, benefits, and alternatives. Specifically, we discussed the risks of infection, bleeding, tissue injury, clip migration, and inadequate sampling. Informed, written consent was given. The usual time out protocol was performed immediately prior to the procedure.  Using sterile technique, 1% Lidocaine, MRI guidance, and a 9  gauge vacuum assisted device, biopsy was performed of the recently demonstrated 6 mm mass in the anterior upper outer left breast using a lateral approach. At the conclusion of the procedure, a barbell shaped tissue marker clip was deployed into the biopsy cavity. Follow-up 2-view mammogram was performed and dictated separately.  IMPRESSION: MRI guided biopsy of a 6 mm left breast mass. No apparent complications.  Electronically Signed: By: Claudie Revering M.D. On: 10/26/2021 09:06 Note: Reviewed        Physical Exam  General appearance: Well nourished, well developed, and well hydrated. In no apparent acute distress Mental status: Alert, oriented x 3 (person, place, & time)       Respiratory: No evidence of acute respiratory distress Eyes: PERLA Vitals: BP (!) 146/90   Pulse 70   Temp (!) 97.5 F (36.4 C) (Temporal)   Resp 16   Ht '4\' 11"'  (1.499 m)   Wt 198 lb (89.8 kg)   SpO2 99%   BMI 39.99 kg/m  BMI: Estimated body mass index is 39.99 kg/m as calculated from the following:   Height as of this encounter: '4\' 11"'  (1.499 m).   Weight as of this encounter: 198 lb (89.8 kg). Ideal: Patient must be at least 60 in tall to calculate ideal body weight  Lumbar Spine Area Exam  Skin & Axial Inspection: No masses, redness, or swelling Alignment: Symmetrical Functional ROM: Pain restricted ROM affecting both sides Stability: No instability detected Muscle Tone/Strength: Functionally intact. No  obvious neuro-muscular anomalies detected. Sensory (Neurological): Musculoskeletal pain pattern Palpation: No palpable anomalies       Provocative Tests: Hyperextension/rotation test: (+) bilaterally for facet joint pain.  Gait & Posture Assessment  Ambulation: Unassisted Gait: Relatively normal for age and body habitus Posture: WNL   Lower Extremity Exam    Side: Right lower extremity  Side: Left lower extremity  Stability: No instability observed          Stability: No instability observed          Skin & Extremity Inspection: Skin color, temperature, and hair growth are WNL. No peripheral edema or cyanosis. No masses, redness, swelling, asymmetry, or associated skin lesions. No contractures.  Skin & Extremity Inspection: Skin color, temperature, and hair growth are WNL. No peripheral edema or cyanosis. No masses, redness, swelling, asymmetry, or associated skin lesions. No contractures.  Functional ROM: Pain restricted ROM for hip joint          Functional ROM: Pain restricted ROM for hip joint          Muscle Tone/Strength: Functionally intact. No obvious neuro-muscular anomalies detected.  Muscle Tone/Strength: Functionally intact. No obvious neuro-muscular anomalies detected.  Sensory (Neurological): Musculoskeletal pain pattern        Sensory (Neurological): Musculoskeletal pain pattern        DTR: Patellar: deferred today Achilles: deferred today Plantar: deferred today  DTR: Patellar: deferred today Achilles: deferred today Plantar: deferred today  Palpation: No palpable anomalies  Palpation: No palpable anomalies    Assessment   Status Diagnosis  Controlled Controlled Controlled 1. Lumbosacral spondylosis without myelopathy   2. Lumbar facet joint syndrome   3. Chronic bilateral low back pain without sciatica   4. Chronic pain of both hips   5. Syrinx of spinal cord ([place])   6. Fibromyalgia   7. Chronic pain syndrome        Plan of Care    [name] has a current medication list which includes  the following long-term medication(s): albuterol, amlodipine, fluticasone, omeprazole, omeprazole, paroxetine, albuterol, and pregabalin.  Discontinue hydrocodone.  Trial of belbuca as below.  Encourage patient to consider aquatic therapy if she notices analgesic and functional benefit with belbuca.   Pharmacotherapy (Medications Ordered): Meds ordered this encounter  Medications   Buprenorphine HCl (BELBUCA) 150 MCG FILM    Sig: Place 1 Film inside cheek 2 (two) times daily.    Dispense:  60 each    Refill:  1    Chronic Pain: STOP Act (Not applicable) Fill 1 day early if closed on refill date. Avoid benzodiazepines within 8 hours of opioids     Follow-up plan:   Return in about 7 weeks (around 03/08/2022) for Medication Management, in person.    Recent Visits Date Type Provider Dept  10/25/21 Office Visit Gillis Santa, MD [facility]  Showing recent visits within past 90 days and meeting all other requirements Today's Visits Date Type Provider Dept  01/18/22 Office Visit Gillis Santa, MD [facility]  Showing today's visits and meeting all other requirements Future Appointments Date Type Provider Dept  03/01/22 Appointment Gillis Santa, MD [facility]  Showing future appointments within next 90 days and meeting all other requirements  I discussed the assessment and treatment plan with the patient. The patient was provided an opportunity to ask questions and all were answered. The patient agreed with the plan and demonstrated an understanding of the instructions.  Patient advised to call back or seek an in-person evaluation if the symptoms or condition worsens.  Duration of encounter: 40mnutes.  Note by: BGillis Santa MD Date: 01/18/2022; Time: 2:32 PM

## 2022-01-19 ENCOUNTER — Telehealth: Payer: Self-pay | Admitting: Student in an Organized Health Care Education/Training Program

## 2022-01-19 NOTE — Telephone Encounter (Signed)
Patient lvmail stating her pain meds needs PA

## 2022-01-19 NOTE — Telephone Encounter (Signed)
PA sent for Belbuca 150 mcg films see key under medications.

## 2022-01-26 ENCOUNTER — Ambulatory Visit: Payer: Self-pay

## 2022-01-26 ENCOUNTER — Telehealth: Payer: Self-pay | Admitting: Student in an Organized Health Care Education/Training Program

## 2022-01-26 NOTE — Patient Instructions (Signed)
Visit Information  Thank you for taking time to visit with me today. Please don't hesitate to contact me if I can be of assistance to you.   Following are the goals we discussed today:   Goals Addressed             This Visit's Progress    RNCM: Effective Management of Pain       Care Coordination Interventions: The patient rates her pain level at a 10 today on a scale of 0-10. She says it does get down to a 6 or 7 at times. The patient saw the pain specialist on 01-18-2022 and states that she is waiting for approval of [place]. She spoke to her insurance company today and the provider needs to do a PA. The provider office has been contacted by the patient and they are giving Dr. Holley Raring the needed information to get her medication approved. Reviewed provider established plan for pain management. Sees specialist on a regular basis.  Discussed importance of adherence to all scheduled medical appointments Will follow up with pain specialist on 03-01-2022 and sees pcp as needs. Had AWV in June Counseled on the importance of reporting any/all new or changed pain symptoms or management strategies to pain management provider. Education provided Advised patient to report to care team affect of pain on daily activities. The patient is unable to do exercises. She was doing PT but was unable to tolerate this. She wants to be able to get her pain under control so she can be more active.  Discussed use of relaxation techniques and/or diversional activities to assist with pain reduction (distraction, imagery, relaxation, massage, acupressure, TENS, heat, and cold application. Discussed journal writing, light therapy, music therapy, mindfulness, wax therapy. The patient is open to ideas to help with effective management of her pain.  Reviewed with patient prescribed pharmacological and nonpharmacological pain relief strategies Advised patient to discuss unresolved pain, changes in level or intensity of pain  with provider Screening for signs and symptoms of depression related to chronic disease state. The patient states when her pain is not well controlled she does get down. Discussed ways to help with depression and also the availability of the LCSW Assessed social determinant of health barriers 01-26-2022: The patient agrees to work with the [facility] in the care coordination program. The patient knows how to reach the [facility] for changes before next outreach. She also has information about resources from the care guides and support from the LCSW and the pharm D.  AWV completed in June of 2023           Our next appointment is by telephone on 04-06-2022 at 2 pm  Please call the care guide team at [phone] if you need to cancel or reschedule your appointment.   If you are experiencing a Mental Health or [place] or need someone to talk to, please call the Suicide and Crisis Lifeline: 988 call the [place] National Suicide Prevention Lifeline: (872) 202-9873 or TTY: (405) 662-1865 TTY 587 485 6876) to talk to a trained counselor call 1-800-273-TALK (toll free, 24 hour hotline)  Patient verbalizes understanding of instructions and care plan provided today and agrees to view in [place]. Active MyChart status and patient understanding of how to access instructions and care plan via MyChart confirmed with patient.     Telephone follow up appointment with care management team member scheduled for: 04-06-2022 at 2 pm  Noreene Larsson RN, MSN, [place] [facility] Mobile:  [phone]      Mindfulness-Based Stress Reduction Mindfulness-based stress reduction (MBSR) is a program that helps people learn to practice mindfulness. Mindfulness is the practice of consciously paying attention to the present moment. MBSR focuses on developing self-awareness, which lets you respond to life stress without judgment or negative feelings. It can be learned and  practiced through techniques such as education, breathing exercises, meditation, and yoga. MBSR includes several mindfulness techniques in one program. MBSR works best when you understand the treatment, are willing to try new things, and can commit to spending time practicing what you learn. MBSR training may include learning about: How your feelings, thoughts, and reactions affect your body. New ways to respond to things that cause negative thoughts to start (triggers). How to notice your thoughts and let go of them. Practicing awareness of everyday things that you normally do without thinking. The techniques and goals of different types of meditation. What are the benefits of MBSR? MBSR can have many benefits, which include helping you to: Develop self-awareness. This means knowing and understanding yourself. Learn skills and attitudes that help you to take part in your own health care. Learn new ways to care for yourself. Be more accepting about how things are, and let things go. Be less judgmental and approach things with an open mind. Be patient with yourself and trust yourself more. MBSR has also been shown to: Reduce negative emotions, such as sadness, overwhelm, and worry. Improve memory and focus. Change how you sense and react to pain. Boost your body's ability to fight infections. Help you connect better with other people. Improve your sense of well-being. How to practice mindfulness To do a basic awareness exercise: Find a comfortable place to sit. Pay attention to the present moment. Notice your thoughts, feelings, and surroundings just as they are. Avoid judging yourself, your feelings, or your surroundings. Make note of any judgment that comes up and let it go. Your mind may wander, and that is okay. Make note of when your thoughts drift, and return your attention to the present moment. To do basic mindfulness meditation: Find a comfortable place to sit. This may include a  stable chair or a firm floor cushion. Sit upright with your back straight. Let your arms fall next to your sides, with your hands resting on your legs. If you are sitting in a chair, rest your feet flat on the floor. If you are sitting on a cushion, cross your legs in front of you. Keep your head in a neutral position with your chin dropped slightly. Relax your jaw and rest the tip of your tongue on the roof of your mouth. Drop your gaze to the floor or close your eyes. Breathe normally and pay attention to your breath. Feel the air moving in and out of your nose. Feel your belly expanding and relaxing with each breath. Your mind may wander, and that is okay. Make note of when your thoughts drift, and return your attention to your breath. Avoid judging yourself, your feelings, or your surroundings. Make note of any judgment or feelings that come up, let them go, and bring your attention back to your breath. When you are ready, lift your gaze or open your eyes. Pay attention to how your body feels after the meditation. Follow these instructions at home:  Find a local in-person or online MBSR program. Set aside some time regularly for mindfulness practice. Practice every day if you can. Even 10 minutes of practice is helpful. Find  a mindfulness practice that works best for you. This may include one or more of the following: Meditation. This involves focusing your mind on a certain thought or activity. Breathing awareness exercises. These help you to stay present by focusing on your breath. Body scan. For this practice, you lie down and pay attention to each part of your body from head to toe. You can identify tension and soreness and consciously relax parts of your body. Yoga. Yoga involves stretching and breathing, and it can improve your ability to move and be flexible. It can also help you to test your body's limits, which can help you release stress. Mindful eating. This way of eating involves  focusing on the taste, texture, color, and smell of each bite of food. This slows down eating and helps you feel full sooner. For this reason, it can be an important part of a weight loss plan. Find a podcast or recording that provides guidance for breathing awareness, body scan, or meditation exercises. You can listen to these any time when you have a free moment to rest without distractions. Follow your treatment plan as told by your health care provider. This may include taking regular medicines and making changes to your diet or lifestyle as recommended. Where to find more information You can find more information about MBSR from: Your health care provider. Community-based meditation centers or programs. Programs offered near you. Summary Mindfulness-based stress reduction (MBSR) is a program that teaches you how to consciously pay attention to the present moment. It is used to help you deal better with daily stress, feelings, and pain. MBSR focuses on developing self-awareness, which allows you to respond to life stress without judgment or negative feelings. MBSR programs may involve learning different mindfulness practices, such as breathing exercises, meditation, yoga, body scan, or mindful eating. Find a mindfulness practice that works best for you, and set aside time for it on a regular basis. This information is not intended to replace advice given to you by your health care provider. Make sure you discuss any questions you have with your health care provider. Document Revised: 12/15/2020 Document Reviewed: 12/15/2020 Elsevier Patient Education  [place] for Aging Adults Staying physically active is important as you age. Physical activity and exercise can help in maintaining quality of life, health, physical function, and reducing falls. The four types of exercises that are best for older adults are endurance, strength, balance, and flexibility. Contact your  health care provider before you start any exercise routine. Ask your health care provider what activities are safe for you. What are the risks? Risks associated with exercising include: Overdoing it. This may lead to sore muscles or fatigue. Falls. Injuries. Dehydration. How to do these exercises Endurance exercises Endurance (aerobic) exercises raise your breathing rate and heart rate. Increasing your endurance helps you do everyday tasks and stay healthy. By improving the health of your body system that includes your heart, lungs, and blood vessels (circulatory system), you may also delay or prevent diseases such as heart disease, diabetes, and weak bones (osteoporosis). Types of endurance exercises include: Sports. Indoor activities, such as using gym equipment, doing water aerobics, or dancing. Outdoor activities, such as biking or jogging. Tasks around the house, such as gardening, yard work, and heavy household chores like cleaning. Walking, such as hiking or walking around your neighborhood. When doing endurance exercises, make sure you: Are aware of your surroundings. Use safety equipment as directed. Dress in layers when exercising outdoors. Drink  plenty of water to stay well hydrated. Build up endurance slowly. Start with 10 minutes at a time, and gradually build up to doing 30 minutes at a time. Unless your health care provider gave you different instructions, aim to exercise for a total of 150 minutes a week. Spread out that time so you are working on endurance 3 or more days a week. Strength exercises Lifting, pulling, or pushing weights helps to strengthen muscles. Having stronger muscles makes it easier to do everyday activities, such as getting up from a chair, climbing stairs, carrying groceries, and playing with grandchildren. Strength exercises include arm and leg exercises that may be done: With weights. Without weights (using your own body weight). With a resistance  band. When doing strength exercises: Move smoothly and steadily. Do not suddenly thrust or jerk the weights, the resistance band, or your body. Start with no weights or with light weights, and gradually add more weight over time. Eventually, aim to use weights that are hard or very hard for you to lift. This means that you are able to do 8 repetitions with the weight, and the last few repetitions are very challenging. Lift or push weights into position for 3 seconds, hold the position for 1 second, and then take 3 seconds to return to your starting position. Breathe out (exhale) during difficult movements, like lifting or pushing weights. Breathe in (inhale) to relax your muscles before the next repetition. Consider alternating arms or legs, especially when you first start strength exercises. Expect some slight muscle soreness after each session. Do strength exercises on 2 or more days a week, for 30 minutes at a time. Avoid exercising the same muscle groups two days in a row. For example, if you work on your leg muscles one day, work on your arm muscles the next day. When you can do two sets of 10-15 repetitions with a certain weight, increase the amount of weight. Balance exercises Balance exercises can help to prevent falls. Balance exercises include: Standing on one foot. Heel-to-toe walk. Balance walk. Tai chi. Make sure you have something sturdy to hold onto while doing balance exercises, such as a sturdy chair. As your balance improves, challenge yourself by holding on to the chair with one hand instead of two, and then with no hands. Trying exercises with your eyes closed also challenges your balance, but be sure to have a sturdy surface (like a countertop) close by in case you need it. Do balance exercises as often as you want, or as often as directed by your health care provider. Flexibility exercises  Flexibility exercises improve how far you can bend, straighten, move, or rotate parts  of your body (range of motion). These exercises also help you do everyday activities such as getting dressed or reaching for objects. Flexibility exercises include stretching different parts of the body, and they may be done in a standing or seated position or on the floor. When stretching, make sure you: Keep a slight bend in your arms and legs. Avoid completely straightening ("locking") your joints. Do not stretch so far that you feel pain. You should feel a mild stretching feeling. You may try stretching farther as you become more flexible over time. Relax and breathe between stretches. Hold on to something sturdy for balance as needed. Hold each stretch for 10-30 seconds. Repeat each stretch 3-5 times. General safety tips Exercise in well-lit areas. Do not hold your breath during exercises or stretches. Warm up before exercising, and cool down after  exercising. This can help prevent injury. Drink plenty of water during exercise or any activity that makes you sweat. If you are not sure if an exercise is safe for you, or you are not sure how to do an exercise, talk with your health care provider. This is especially important if you have had surgery on muscles, bones, or joints (orthopedic surgery). Where to find more information You can find more information about exercise for older adults from: Your local health department, fitness center, or community center. These facilities may have programs for aging adults. [employer] on Aging: http://kim-miller.com/ National Council on Aging: www.ncoa.org Summary Staying physically active is important as you age. Doing endurance, strength, balance, and flexibility exercises can help in maintaining quality of life, health, physical function, and reducing falls. Make sure to contact your health care provider before you start any exercise routine. Ask your health care provider what activities are safe for you. This information is not intended to replace  advice given to you by your health care provider. Make sure you discuss any questions you have with your health care provider. Document Revised: 09/19/2020 Document Reviewed: 09/19/2020 Elsevier Patient Education  [place].

## 2022-01-26 NOTE — Patient Outreach (Signed)
  Care Coordination   Initial Visit Note   01/26/2022 Name: Kristin Porter MRN: [id number] DOB: 11/16/1960  Kristin Porter is a 61 y.o. year old female who sees Rhodes, Coralie Keens, NP for primary care. I spoke with  Kristin Porter by phone today.  What matters to the patients health and wellness today?  Getting her pain under control    Goals Addressed             This Visit's Progress    RNCM: Effective Management of Pain       Care Coordination Interventions: The patient rates her pain level at a 10 today on a scale of 0-10. She says it does get down to a 6 or 7 at times. The patient saw the pain specialist on 01-18-2022 and states that she is waiting for approval of [place]. She spoke to her insurance company today and the provider needs to do a PA. The provider office has been contacted by the patient and they are giving Dr. Holley Raring the needed information to get her medication approved. Reviewed provider established plan for pain management. Sees specialist on a regular basis.  Discussed importance of adherence to all scheduled medical appointments Will follow up with pain specialist on 03-01-2022 and sees pcp as needs. Had AWV in June Counseled on the importance of reporting any/all new or changed pain symptoms or management strategies to pain management provider. Education provided Advised patient to report to care team affect of pain on daily activities. The patient is unable to do exercises. She was doing PT but was unable to tolerate this. She wants to be able to get her pain under control so she can be more active.  Discussed use of relaxation techniques and/or diversional activities to assist with pain reduction (distraction, imagery, relaxation, massage, acupressure, TENS, heat, and cold application. Discussed journal writing, light therapy, music therapy, mindfulness, wax therapy. The patient is open to ideas to help with effective management of her pain.  Reviewed with  patient prescribed pharmacological and nonpharmacological pain relief strategies Advised patient to discuss unresolved pain, changes in level or intensity of pain with provider Screening for signs and symptoms of depression related to chronic disease state. The patient states when her pain is not well controlled she does get down. Discussed ways to help with depression and also the availability of the LCSW Assessed social determinant of health barriers 01-26-2022: The patient agrees to work with the [facility] in the care coordination program. The patient knows how to reach the [facility] for changes before next outreach. She also has information about resources from the care guides and support from the LCSW and the pharm D.  AWV completed in June of 2023           SDOH assessments and interventions completed:  Yes  SDOH Interventions Today    Flowsheet Row Most Recent Value  SDOH Interventions   Housing Interventions Intervention Not Indicated  Utilities Interventions Intervention Not Indicated        Care Coordination Interventions Activated:  Yes  Care Coordination Interventions:  Yes, provided   Follow up plan: Follow up call scheduled for 04-06-2022 at 2 pm    Encounter Outcome:  Pt. Visit Completed   Kristin Larsson RN, MSN, [place] [facility] Mobile: [phone]

## 2022-01-26 NOTE — Telephone Encounter (Signed)
Pt stated that  she spoke with Kristin Porter  and was told that they needs more details from Dr. Holley Raring. On why the she needs the prescription that he had send to the pharmacy. PT stated that if Dr. Holley Raring gives more details insurance company will pay for the prescription. Please give patient a call. Thanks

## 2022-01-29 NOTE — Telephone Encounter (Signed)
I called [facility], a PA is needed for Buprenorphine. Done.

## 2022-01-30 ENCOUNTER — Telehealth: Payer: Self-pay | Admitting: Student in an Organized Health Care Education/Training Program

## 2022-01-30 ENCOUNTER — Other Ambulatory Visit: Payer: Self-pay | Admitting: *Deleted

## 2022-01-30 DIAGNOSIS — M797 Fibromyalgia: Secondary | ICD-10-CM

## 2022-01-30 MED ORDER — BACLOFEN 10 MG PO TABS: 10.0000 mg | ORAL_TABLET | Freq: Four times a day (QID) | ORAL | 5 refills | Status: DC | PRN

## 2022-01-30 NOTE — Telephone Encounter (Signed)
Pharmacy stated that patient was told she had an prescription that had been send in. Pharmacy stated that they don't have any meds refills for patient to be pick up. Can you give pharmacy a call.

## 2022-01-30 NOTE — Telephone Encounter (Signed)
Medication refill request sent.  

## 2022-03-01 ENCOUNTER — Encounter: Payer: Self-pay | Admitting: Student in an Organized Health Care Education/Training Program

## 2022-03-01 ENCOUNTER — Ambulatory Visit
Payer: Medicare Other | Attending: Student in an Organized Health Care Education/Training Program | Admitting: Student in an Organized Health Care Education/Training Program

## 2022-03-01 VITALS — BP 147/86 | HR 77 | Temp 98.1°F | Resp 16 | Ht 59.0 in | Wt 198.0 lb

## 2022-03-01 DIAGNOSIS — G8929 Other chronic pain: Secondary | ICD-10-CM

## 2022-03-01 DIAGNOSIS — M47816 Spondylosis without myelopathy or radiculopathy, lumbar region: Secondary | ICD-10-CM | POA: Diagnosis not present

## 2022-03-01 DIAGNOSIS — M7918 Myalgia, other site: Secondary | ICD-10-CM | POA: Diagnosis not present

## 2022-03-01 DIAGNOSIS — M25551 Pain in right hip: Secondary | ICD-10-CM | POA: Diagnosis not present

## 2022-03-01 DIAGNOSIS — M797 Fibromyalgia: Secondary | ICD-10-CM

## 2022-03-01 DIAGNOSIS — M545 Low back pain, unspecified: Secondary | ICD-10-CM | POA: Diagnosis not present

## 2022-03-01 DIAGNOSIS — M47817 Spondylosis without myelopathy or radiculopathy, lumbosacral region: Secondary | ICD-10-CM | POA: Diagnosis not present

## 2022-03-01 DIAGNOSIS — G894 Chronic pain syndrome: Secondary | ICD-10-CM | POA: Diagnosis not present

## 2022-03-01 DIAGNOSIS — G950 Syringomyelia and syringobulbia: Secondary | ICD-10-CM

## 2022-03-01 DIAGNOSIS — M25552 Pain in left hip: Secondary | ICD-10-CM

## 2022-03-01 MED ORDER — BELBUCA 150 MCG BU FILM: 1.0000 | ORAL_FILM | Freq: Two times a day (BID) | BUCCAL | 0 refills | Status: AC

## 2022-03-01 NOTE — Progress Notes (Signed)
PROVIDER NOTE: Information contained herein reflects review and annotations entered in association with encounter. Interpretation of such information and data should be left to medically-trained personnel. Information provided to patient can be located elsewhere in the medical record under "Patient Instructions". Document created using STT-dictation technology, any transcriptional errors that may result from process are unintentional.    Patient: Kristin Porter  Service Category: E/M  Provider: Gillis Santa, MD  DOB: 04/10/1961  DOS: 03/01/2022  Specialty: Interventional Pain Management  MRN: [id number]  Setting: Ambulatory outpatient  PCP: Jearld Fenton, NP  Type: Established Patient    Referring Provider: Jearld Fenton, NP  Location: Office  Delivery: Face-to-face     HPI  [name], a 61 y.o. year old female, is here today because of her Lumbosacral spondylosis without myelopathy [M47.817]. [name] primary complain today is Back Pain (lower) Last encounter: My last encounter with her was on 01/18/22 Pertinent problems: [name] has Fibromyalgia; Lumbosacral spondylosis without myelopathy; Chronic pain syndrome; and Lumbar facet joint syndrome on their pertinent problem list. Pain Assessment: Severity of Chronic pain is reported as a 7 /10. Location: Back Lower/denies. Onset: More than a month ago. Quality: Aching, Dull, Sharp, Throbbing, Cramping. Timing: Constant. Modifying factor(s): sitting, Buprenorphine. Vitals:  height is '4\' 11"'  (1.499 m) and weight is 198 lb (89.8 kg). Her temporal temperature is 98.1 F (36.7 C). Her blood pressure is 147/86 (abnormal) and her pulse is 77. Her respiration is 16 and oxygen saturation is 98%.   Reason for encounter:   Patient is endorsing significant benefit with belbuca 150 mcg that she takes once to twice a day.  She states that her pain is significantly improved.  She is much more functional.  Pharmacotherapy Assessment   Analgesic: Belbuca 150 mcg BID  Monitoring: [place] PMP: PDMP reviewed during this encounter.       Pharmacotherapy: No side-effects or adverse reactions reported. Compliance: No problems identified. Effectiveness: Clinically acceptable.  Landis Martins, RN  03/01/2022 11:17 AM  Sign when Signing Visit Nursing Pain Medication Assessment:  Safety precautions to be maintained throughout the outpatient stay will include: orient to surroundings, keep bed in low position, maintain call bell within reach at all times, provide assistance with transfer out of bed and ambulation.  Medication Inspection Compliance: [name] did not comply with our request to bring her pills to be counted. She was reminded that bringing the medication bottles, even when empty, is a requirement.  Medication: None brought in. Pill/Patch Count: None available to be counted. Bottle Appearance: No container available. Did not bring bottle(s) to appointment. Filled Date: N/A Last Medication intake:   1 week ago Safety precautions to be maintained throughout the outpatient stay will include: orient to surroundings, keep bed in low position, maintain call bell within reach at all times, provide assistance with transfer out of bed and ambulation.      UDS:  Summary  Date Value Ref Range Status  10/13/2020 Note  Final    Comment:    ==================================================================== [employer] 13 (MW) ==================================================================== Test                             Result       Flag       Units  Drug Present and Declared for Prescription Verification   Hydrocodone  697          EXPECTED   ng/mg creat   Hydromorphone                  365          EXPECTED   ng/mg creat   Dihydrocodeine                 240          EXPECTED   ng/mg creat   Norhydrocodone                 620          EXPECTED   ng/mg creat    Sources of hydrocodone include  scheduled prescription medications.    Hydromorphone, dihydrocodeine and norhydrocodone are expected    metabolites of hydrocodone. Hydromorphone and dihydrocodeine are    also available as scheduled prescription medications.  Drug Present not Declared for Prescription Verification   Oxazepam                       21           UNEXPECTED ng/mg creat   Temazepam                      15           UNEXPECTED ng/mg creat    Oxazepam and temazepam are expected metabolites of diazepam.    Oxazepam is also an expected metabolite of other benzodiazepine    drugs, including chlordiazepoxide, prazepam, clorazepate, halazepam,    and temazepam.  Oxazepam and temazepam are available as scheduled    prescription medications.  ==================================================================== Test                      Result    Flag   Units      Ref Range   Creatinine              149              mg/dL      >=20 ==================================================================== Declared Medications:  The flagging and interpretation on this report are based on the  following declared medications.  Unexpected results may arise from  inaccuracies in the declared medications.   **Note: The testing scope of this panel includes these medications:   Hydrocodone   **Note: The testing scope of this panel does not include the  following reported medications:   Acetaminophen  Albuterol  Amlodipine  Baclofen  Evolocumab (Repatha)  Fluticasone  Hydrochlorothiazide  Meloxicam  Omeprazole  Paroxetine  Pregabalin ==================================================================== For clinical consultation, please call [phone]. ====================================================================      ROS  Constitutional: Denies any fever or chills Gastrointestinal: No reported hemesis, hematochezia, vomiting, or acute GI distress Musculoskeletal:  Low back pain, worse with facet  loading and lumbar extension, improved with Belbuca Neurological: No reported episodes of acute onset apraxia, aphasia, dysarthria, agnosia, amnesia, paralysis, loss of coordination, or loss of consciousness  Medication Review  Blood Pressure Kit, Buprenorphine HCl, PARoxetine, Suvorexant, albuterol, amLODipine, baclofen, fluticasone, meloxicam, omeprazole, pregabalin, and triamcinolone cream  History Review  Allergy: [name] is allergic to tizanidine, lisinopril, and tramadol. Drug: [name]  reports no history of drug use. Alcohol:  reports that she does not currently use alcohol. Tobacco:  reports that she quit smoking about 53 years ago. Her smoking use included cigarettes. She has never used smokeless tobacco.  Social: [name]  reports that she quit smoking about 53 years ago. Her smoking use included cigarettes. She has never used smokeless tobacco. She reports that she does not currently use alcohol. She reports that she does not use drugs. Medical:  has a past medical history of Arthritis, Fibromyalgia, Hypertension, and Neuropathy. Surgical: [name]  has a past surgical history that includes Breast biopsy (Right, 2018); Esophagogastroduodenoscopy (egd) with propofol (N/A, 02/22/2021); and Breast biopsy (Left, 07/12/2021). Family: Family history is unknown by patient.  Laboratory Chemistry Profile   Renal Lab Results  Component Value Date   BUN 11 12/12/2020   CREATININE 0.63 05/29/3233   BCR NOT APPLICABLE 57/32/2025   GFRAA 107 03/22/2020   GFRNONAA 92 03/22/2020    Hepatic Lab Results  Component Value Date   AST 17 12/12/2020   ALT 16 12/12/2020    Electrolytes Lab Results  Component Value Date   NA 141 12/12/2020   K 4.4 12/12/2020   CL 107 12/12/2020   CALCIUM 9.1 12/12/2020    Bone No results found for: "VD25OH", "VD125OH2TOT", "KY7062BJ6", "EG3151VO1", "25OHVITD1", "25OHVITD2", "25OHVITD3", "TESTOFREE", "TESTOSTERONE"  Inflammation (CRP: Acute  Phase) (ESR: Chronic Phase) Lab Results  Component Value Date   CRP 15.2 (H) 12/12/2020   ESRSEDRATE 34 (H) 12/12/2020         Note: Above Lab results reviewed.  Recent Imaging Review  MR LT BREAST BX W LOC DEV 1ST LESION IMAGE BX SPEC MR GUIDE Addendum: ADDENDUM REPORT: 10/31/2021 18:56   ADDENDUM:  Pathology revealed Breast, LEFT, needle core biopsy, anterior, upper  outer quadrant (barbell clip)- SCLEROSED INTRADUCTAL PAPILLOMA WITH  USUAL DUCTAL HYPERPLASIA AND CALCIFICATIONS AND ASSOCIATED BENIGN  BREAST TISSUE WITH DUCTULAR DILATATION/ECTASIA AND BACKGROUND  FIBROCYSTIC CHANGE WITH APOCRINE METAPLASIA. This was found to be  concordant by Dr. Claudie Revering, with surgical consultation for  excision recommended per protocol- given patient symptomatic and  age.   Pathology results were discussed with the patient by telephone. The  patient reported doing well after the biopsy with tenderness at the  site. Post biopsy instructions and care were reviewed and questions  were answered. The patient was encouraged to call [facility] for any additional concerns.   Per patient request, surgical consultation has been arranged with  Dr. Hervey Ard at [facility] on November 07, 2021.   Pathology results reported by Stacie Acres RN on 10/30/2021.   Electronically Signed    By: Claudie Revering M.D.    On: 10/31/2021 18:56 Narrative: CLINICAL DATA:  6 mm mass seen in the anterior upper outer left breast on a bilateral breast MRI performed for spontaneous bloody left nipple discharge.  EXAM: MRI GUIDED CORE NEEDLE BIOPSY OF THE LEFT BREAST  TECHNIQUE: Multiplanar, multisequence MR imaging of the left breast was performed both before and after administration of intravenous contrast.  CONTRAST:  9 cc Gadavist  COMPARISON:  Bilateral breast MRI dated 08/16/2021  FINDINGS: I met with the patient, and we discussed the procedure of MRI  guided biopsy, including risks, benefits, and alternatives. Specifically, we discussed the risks of infection, bleeding, tissue injury, clip migration, and inadequate sampling. Informed, written consent was given. The usual time out protocol was performed immediately prior to the procedure.  Using sterile technique, 1% Lidocaine, MRI guidance, and a 9 gauge vacuum assisted device, biopsy was performed of the recently demonstrated 6 mm mass in the anterior upper outer left breast using a lateral approach. At the conclusion  of the procedure, a barbell shaped tissue marker clip was deployed into the biopsy cavity. Follow-up 2-view mammogram was performed and dictated separately.  IMPRESSION: MRI guided biopsy of a 6 mm left breast mass. No apparent complications.  Electronically Signed: By: Claudie Revering M.D. On: 10/26/2021 09:06 Note: Reviewed        Physical Exam  General appearance: Well nourished, well developed, and well hydrated. In no apparent acute distress Mental status: Alert, oriented x 3 (person, place, & time)       Respiratory: No evidence of acute respiratory distress Eyes: PERLA Vitals: BP (!) 147/86   Pulse 77   Temp 98.1 F (36.7 C) (Temporal)   Resp 16   Ht '4\' 11"'  (1.499 m)   Wt 198 lb (89.8 kg)   SpO2 98%   BMI 39.99 kg/m  BMI: Estimated body mass index is 39.99 kg/m as calculated from the following:   Height as of this encounter: '4\' 11"'  (1.499 m).   Weight as of this encounter: 198 lb (89.8 kg). Ideal: Patient must be at least 60 in tall to calculate ideal body weight  Lumbar Spine Area Exam  Skin & Axial Inspection: No masses, redness, or swelling Alignment: Symmetrical Functional ROM: Pain restricted ROM affecting both sides Stability: No instability detected Muscle Tone/Strength: Functionally intact. No obvious neuro-muscular anomalies detected. Sensory (Neurological): Musculoskeletal pain pattern Palpation: No palpable anomalies        Provocative Tests: Hyperextension/rotation test: (+) bilaterally for facet joint pain.  Gait & Posture Assessment  Ambulation: Unassisted Gait: Relatively normal for age and body habitus Posture: WNL   Lower Extremity Exam    Side: Right lower extremity  Side: Left lower extremity  Stability: No instability observed          Stability: No instability observed          Skin & Extremity Inspection: Skin color, temperature, and hair growth are WNL. No peripheral edema or cyanosis. No masses, redness, swelling, asymmetry, or associated skin lesions. No contractures.  Skin & Extremity Inspection: Skin color, temperature, and hair growth are WNL. No peripheral edema or cyanosis. No masses, redness, swelling, asymmetry, or associated skin lesions. No contractures.  Functional ROM: Pain restricted ROM for hip joint          Functional ROM: Pain restricted ROM for hip joint          Muscle Tone/Strength: Functionally intact. No obvious neuro-muscular anomalies detected.  Muscle Tone/Strength: Functionally intact. No obvious neuro-muscular anomalies detected.  Sensory (Neurological): Musculoskeletal pain pattern        Sensory (Neurological): Musculoskeletal pain pattern        DTR: Patellar: deferred today Achilles: deferred today Plantar: deferred today  DTR: Patellar: deferred today Achilles: deferred today Plantar: deferred today  Palpation: No palpable anomalies  Palpation: No palpable anomalies    Assessment   Status Diagnosis  Controlled Controlled Controlled 1. Lumbosacral spondylosis without myelopathy   2. Lumbar facet joint syndrome   3. Chronic bilateral low back pain without sciatica   4. Chronic pain of both hips   5. Syrinx of spinal cord ([place])   6. Fibromyalgia   7. Chronic pain syndrome   8. Myofascial pain syndrome of lumbar spine        Plan of Care    Ms. Keaghan O [place] has a current medication list which includes the following long-term medication(s):  albuterol, fluticasone, omeprazole, paroxetine, pregabalin, amlodipine, and omeprazole.   Pharmacotherapy (Medications Ordered): Meds ordered this  encounter  Medications   Buprenorphine HCl (BELBUCA) 150 MCG FILM    Sig: Place 1 Film inside cheek every 12 (twelve) hours.    Dispense:  60 Film    Refill:  0    Chronic Pain: STOP Act (Not applicable) Fill 1 day early if closed on refill date. Avoid benzodiazepines within 8 hours of opioids   Continue with Lyrica and baclofen as prescribed.  No refills needed.  Follow-up plan:   Return in about 17 weeks (around 06/28/2022) for Medication Management, in person.    Recent Visits Date Type Provider Dept  01/18/22 Office Visit Gillis Santa, MD [facility]  Showing recent visits within past 90 days and meeting all other requirements Today's Visits Date Type Provider Dept  03/01/22 Office Visit Gillis Santa, MD [facility]  Showing today's visits and meeting all other requirements Future Appointments No visits were found meeting these conditions. Showing future appointments within next 90 days and meeting all other requirements  I discussed the assessment and treatment plan with the patient. The patient was provided an opportunity to ask questions and all were answered. The patient agreed with the plan and demonstrated an understanding of the instructions.  Patient advised to call back or seek an in-person evaluation if the symptoms or condition worsens.  Duration of encounter: 36mnutes.  Note by: BGillis Santa MD Date: 03/01/2022; Time: 11:41 AM

## 2022-03-01 NOTE — Progress Notes (Signed)
Nursing Pain Medication Assessment:  Safety precautions to be maintained throughout the outpatient stay will include: orient to surroundings, keep bed in low position, maintain call bell within reach at all times, provide assistance with transfer out of bed and ambulation.  Medication Inspection Compliance: Kristin Porter did not comply with our request to bring her pills to be counted. She was reminded that bringing the medication bottles, even when empty, is a requirement.  Medication: None brought in. Pill/Patch Count: None available to be counted. Bottle Appearance: No container available. Did not bring bottle(s) to appointment. Filled Date: N/A Last Medication intake:   1 week ago Safety precautions to be maintained throughout the outpatient stay will include: orient to surroundings, keep bed in low position, maintain call bell within reach at all times, provide assistance with transfer out of bed and ambulation.

## 2022-03-17 ENCOUNTER — Other Ambulatory Visit: Payer: Self-pay | Admitting: Internal Medicine

## 2022-03-17 DIAGNOSIS — F5101 Primary insomnia: Secondary | ICD-10-CM

## 2022-03-19 NOTE — Telephone Encounter (Signed)
Requested medication (s) are due for refill today: yes  Requested medication (s) are on the active medication list: yes  Last refill:  05/05/21  Future visit scheduled: yes  Notes to clinic:   Medication not assigned to a protocol, review manually.    Requested Prescriptions  Pending Prescriptions Disp Refills   [place] 5 MG TABS [Pharmacy Med Name: Belsomra 5 MG Oral Tablet] 30 tablet 0    Sig: TAKE 1 TABLET BY MOUTH AT BEDTIME     Off-Protocol Failed - 03/17/2022  3:17 PM      Failed - Medication not assigned to a protocol, review manually.      Passed - Valid encounter within last 12 months    Recent Outpatient Visits           9 months ago Nipple discharge   [place], NP   1 year ago Primary insomnia   [facility] [place], Coralie Keens, NP   1 year ago Encounter for general adult medical examination with abnormal findings   [facility] [place], Coralie Keens, NP   1 year ago Hypertension, unspecified type   [place], FNP   2 years ago Mixed hyperlipidemia   [facility], Lupita Raider, 

## 2022-03-27 ENCOUNTER — Other Ambulatory Visit: Payer: Self-pay | Admitting: Internal Medicine

## 2022-03-27 DIAGNOSIS — F5101 Primary insomnia: Secondary | ICD-10-CM

## 2022-03-28 NOTE — Telephone Encounter (Signed)
Requested medication (s) are due for refill today:   Provider to review  Requested medication (s) are on the active medication list:   Yes  Future visit scheduled:   No   Last ordered: 05/05/2022 #30, 0 refills  Returned because there is not a protocol assigned to this medication.      Requested Prescriptions  Pending Prescriptions Disp Refills   BELSOMRA 5 MG TABS [Pharmacy Med Name: Belsomra 5 MG Oral Tablet] 30 tablet 0    Sig: TAKE 1 TABLET BY MOUTH AT BEDTIME     Off-Protocol Failed - 03/27/2022  5:59 PM      Failed - Medication not assigned to a protocol, review manually.      Passed - Valid encounter within last 12 months    Recent Outpatient Visits           9 months ago Nipple discharge   [place], NP   1 year ago Primary insomnia   [facility] [place], Coralie Keens, NP   1 year ago Encounter for general adult medical examination with abnormal findings   [facility] [place], Coralie Keens, NP   2 years ago Hypertension, unspecified type   [place], FNP   2 years ago Mixed hyperlipidemia   [facility], Lupita Raider, [place]

## 2022-04-06 ENCOUNTER — Ambulatory Visit (INDEPENDENT_AMBULATORY_CARE_PROVIDER_SITE_OTHER): Payer: Medicare Other | Admitting: Internal Medicine

## 2022-04-06 ENCOUNTER — Encounter: Payer: Self-pay | Admitting: Internal Medicine

## 2022-04-06 ENCOUNTER — Ambulatory Visit: Payer: Self-pay | Admitting: *Deleted

## 2022-04-06 VITALS — BP 134/82 | HR 66 | Temp 96.6°F | Ht 59.0 in | Wt 199.0 lb

## 2022-04-06 DIAGNOSIS — Z1231 Encounter for screening mammogram for malignant neoplasm of breast: Secondary | ICD-10-CM

## 2022-04-06 DIAGNOSIS — R519 Headache, unspecified: Secondary | ICD-10-CM | POA: Diagnosis not present

## 2022-04-06 DIAGNOSIS — Z0001 Encounter for general adult medical examination with abnormal findings: Secondary | ICD-10-CM | POA: Diagnosis not present

## 2022-04-06 DIAGNOSIS — R7303 Prediabetes: Secondary | ICD-10-CM

## 2022-04-06 DIAGNOSIS — F5101 Primary insomnia: Secondary | ICD-10-CM | POA: Diagnosis not present

## 2022-04-06 DIAGNOSIS — E785 Hyperlipidemia, unspecified: Secondary | ICD-10-CM | POA: Diagnosis not present

## 2022-04-06 MED ORDER — OMEPRAZOLE 40 MG PO CPDR
40.0000 mg | DELAYED_RELEASE_CAPSULE | Freq: Two times a day (BID) | ORAL | 1 refills | Status: DC
Start: 2022-04-06 — End: 2022-09-24

## 2022-04-06 MED ORDER — BELSOMRA 5 MG PO TABS
1.0000 | ORAL_TABLET | Freq: Every day | ORAL | 0 refills | Status: DC
Start: 2022-04-06 — End: 2022-10-05

## 2022-04-06 MED ORDER — BUTALBITAL-APAP-CAFFEINE 50-325-40 MG PO TABS: 1.0000 | ORAL_TABLET | Freq: Four times a day (QID) | ORAL | 0 refills | Status: DC | PRN

## 2022-04-06 NOTE — Patient Instructions (Signed)
Visit Information  Thank you for taking time to visit with me today. Please don't hesitate to contact me if I can be of assistance to you before our next scheduled telephone appointment.  Following are the goals we discussed today:  Monitor blood pressure daily as pain management may affect HTN.  Our next appointment is by telephone on 2/5  Please call the care guide team at [phone] if you need to cancel or reschedule your appointment.   Please call the Suicide and Crisis Lifeline: 988 call the [place] National Suicide Prevention Lifeline: 6152027406 or TTY: 812-876-0695 TTY (503)700-4393) to talk to a trained counselor call 1-800-273-TALK (toll free, 24 hour hotline) call 911 if you are experiencing a Mental Health or [place] or need someone to talk to.  Patient verbalizes understanding of instructions and care plan provided today and agrees to view in [place]. Active MyChart status and patient understanding of how to access instructions and care plan via MyChart confirmed with patient.     The patient has been provided with contact information for the care management team and has been advised to call with any health related questions or concerns.   Valente David, RN, MSN, [place] Care Management Care Management Coordinator [phone]

## 2022-04-06 NOTE — Patient Outreach (Signed)
  Care Coordination   Follow Up Visit Note   04/06/2022 Name: NNEOMA HARRAL MRN: [id number] DOB: 06/17/1960  Kasy Herma Ard is a 61 y.o. year old female who sees [place], Coralie Keens, NP for primary care. I spoke with  Xiara Herma Ard by phone today.  What matters to the patients health and wellness today?  Pain remains an issue, however she state is varies from day to day, hour to hour.  Today is a better day.    Goals Addressed             This Visit's Progress    RNCM: Effective Management of Pain   On track    Care Coordination Interventions: The patient rates her pain level at a 5 today on a scale of 0-10. The patient saw the pain specialist on 10/12, obtained approval of Belbuca and has started taking.  Follow up on 1/30 Reviewed provider established plan for pain management. Sees specialist on a regular basis.  Discussed importance of adherence to all scheduled medical appointments Last seen PCP today 11/17, will follow up in 6 months. Counseled on the importance of reporting any/all new or changed pain symptoms or management strategies to pain management provider. Education provided Advised patient to report to care team affect of pain on daily activities. The patient is unable to do exercises Discussed use of relaxation techniques and/or diversional activities to assist with pain reduction (distraction, imagery, relaxation, massage, acupressure, TENS, heat, and cold application. Discussed journal writing, light therapy, music therapy, mindfulness, wax therapy. The patient is open to ideas to help with effective management of her pain.  Reviewed with patient prescribed pharmacological and nonpharmacological pain relief strategies Advised patient to discuss unresolved pain, changes in level or intensity of pain with provider          SDOH assessments and interventions completed:  No     Care Coordination Interventions Activated:  Yes  Care Coordination  Interventions:  Yes, provided   Follow up plan: Follow up call scheduled for 2/5    Encounter Outcome:  Pt. Visit Completed   Valente David, RN, MSN, [place] Management Care Management Coordinator [phone]

## 2022-04-06 NOTE — Patient Instructions (Signed)
Health Maintenance for Postmenopausal Women Menopause is a normal process in which your ability to get pregnant comes to an end. This process happens slowly over many months or years, usually between the ages of 48 and 55. Menopause is complete when you have missed your menstrual period for 12 months. It is important to talk with your health care provider about some of the most common conditions that affect women after menopause (postmenopausal women). These include heart disease, cancer, and bone loss (osteoporosis). Adopting a healthy lifestyle and getting preventive care can help to promote your health and wellness. The actions you take can also lower your chances of developing some of these common conditions. What are the signs and symptoms of menopause? During menopause, you may have the following symptoms: Hot flashes. These can be moderate or severe. Night sweats. Decrease in sex drive. Mood swings. Headaches. Tiredness (fatigue). Irritability. Memory problems. Problems falling asleep or staying asleep. Talk with your health care provider about treatment options for your symptoms. Do I need hormone replacement therapy? Hormone replacement therapy is effective in treating symptoms that are caused by menopause, such as hot flashes and night sweats. Hormone replacement carries certain risks, especially as you become older. If you are thinking about using estrogen or estrogen with progestin, discuss the benefits and risks with your health care provider. How can I reduce my risk for heart disease and stroke? The risk of heart disease, heart attack, and stroke increases as you age. One of the causes may be a change in the body's hormones during menopause. This can affect how your body uses dietary fats, triglycerides, and cholesterol. Heart attack and stroke are medical emergencies. There are many things that you can do to help prevent heart disease and stroke. Watch your blood pressure High  blood pressure causes heart disease and increases the risk of stroke. This is more likely to develop in people who have high blood pressure readings or are overweight. Have your blood pressure checked: Every 3-5 years if you are 18-39 years of age. Every year if you are 40 years old or older. Eat a healthy diet  Eat a diet that includes plenty of vegetables, fruits, low-fat dairy products, and lean protein. Do not eat a lot of foods that are high in solid fats, added sugars, or sodium. Get regular exercise Get regular exercise. This is one of the most important things you can do for your health. Most adults should: Try to exercise for at least 150 minutes each week. The exercise should increase your heart rate and make you sweat (moderate-intensity exercise). Try to do strengthening exercises at least twice each week. Do these in addition to the moderate-intensity exercise. Spend less time sitting. Even light physical activity can be beneficial. Other tips Work with your health care provider to achieve or maintain a healthy weight. Do not use any products that contain nicotine or tobacco. These products include cigarettes, chewing tobacco, and vaping devices, such as e-cigarettes. If you need help quitting, ask your health care provider. Know your numbers. Ask your health care provider to check your cholesterol and your blood sugar (glucose). Continue to have your blood tested as directed by your health care provider. Do I need screening for cancer? Depending on your health history and family history, you may need to have cancer screenings at different stages of your life. This may include screening for: Breast cancer. Cervical cancer. Lung cancer. Colorectal cancer. What is my risk for osteoporosis? After menopause, you may be   at increased risk for osteoporosis. Osteoporosis is a condition in which bone destruction happens more quickly than new bone creation. To help prevent osteoporosis or  the bone fractures that can happen because of osteoporosis, you may take the following actions: If you are 19-50 years old, get at least 1,000 mg of calcium and at least 600 international units (IU) of vitamin D per day. If you are older than age 50 but younger than age 70, get at least 1,200 mg of calcium and at least 600 international units (IU) of vitamin D per day. If you are older than age 70, get at least 1,200 mg of calcium and at least 800 international units (IU) of vitamin D per day. Smoking and drinking excessive alcohol increase the risk of osteoporosis. Eat foods that are rich in calcium and vitamin D, and do weight-bearing exercises several times each week as directed by your health care provider. How does menopause affect my mental health? Depression may occur at any age, but it is more common as you become older. Common symptoms of depression include: Feeling depressed. Changes in sleep patterns. Changes in appetite or eating patterns. Feeling an overall lack of motivation or enjoyment of activities that you previously enjoyed. Frequent crying spells. Talk with your health care provider if you think that you are experiencing any of these symptoms. General instructions See your health care provider for regular wellness exams and vaccines. This may include: Scheduling regular health, dental, and eye exams. Getting and maintaining your vaccines. These include: Influenza vaccine. Get this vaccine each year before the flu season begins. Pneumonia vaccine. Shingles vaccine. Tetanus, diphtheria, and pertussis (Tdap) booster vaccine. Your health care provider may also recommend other immunizations. Tell your health care provider if you have ever been abused or do not feel safe at home. Summary Menopause is a normal process in which your ability to get pregnant comes to an end. This condition causes hot flashes, night sweats, decreased interest in sex, mood swings, headaches, or lack  of sleep. Treatment for this condition may include hormone replacement therapy. Take actions to keep yourself healthy, including exercising regularly, eating a healthy diet, watching your weight, and checking your blood pressure and blood sugar levels. Get screened for cancer and depression. Make sure that you are up to date with all your vaccines. This information is not intended to replace advice given to you by your health care provider. Make sure you discuss any questions you have with your health care provider. Document Revised: 09/26/2020 Document Reviewed: 09/26/2020 Elsevier Patient Education  2023 Elsevier Inc.  

## 2022-04-06 NOTE — Progress Notes (Signed)
Subjective:    Patient ID: Kristin Porter, female    DOB: 27-Feb-1961, 61 y.o.   MRN: [id number]  HPI  Patient presents the clinic today for her annual exam.  Flu: 02/2012 Tetanus: 04/2018 COVID: Never Shingrix: Never Pap smear: 08/2019 Mammogram: 09/2021 Bone density: 09/2019 Colon screening: 02/2019 Vision screening: annually Dentist: biannually  Diet: She does eat meat. She consumes fruits and veggies. She does eat some fried foods. She drinks mostly water, tea, juice. Exercise: None   Review of Systems  Past Medical History:  Diagnosis Date   Arthritis    Bilateral hips   Fibromyalgia    Hypertension    Neuropathy     Current Outpatient Medications  Medication Sig Dispense Refill   albuterol (PROVENTIL) (2.5 MG/3ML) 0.083% nebulizer solution Take 3 mLs (2.5 mg total) by nebulization every 6 (six) hours as needed for wheezing or shortness of breath. 75 mL 0   amLODipine (NORVASC) 10 MG tablet Take 1 tablet (10 mg total) by mouth daily. 90 tablet 1   baclofen (LIORESAL) 10 MG tablet Take 1 tablet (10 mg total) by mouth 4 (four) times daily as needed for muscle spasms. 120 each 5   BELSOMRA 5 MG TABS TAKE 1 TABLET BY MOUTH AT BEDTIME 30 tablet 0   Blood Pressure Monitoring (BLOOD PRESSURE KIT) DEVI 1 Units by Does not apply route in the morning and at bedtime. 1 each 0   [START ON 04/25/2022] Buprenorphine HCl (BELBUCA) 150 MCG FILM Place 1 Film inside cheek every 12 (twelve) hours. 60 Film 0   fluticasone (FLONASE) 50 MCG/ACT nasal spray Place 1 spray into both nostrils 2 (two) times daily. 9.9 mL 1   meloxicam (MOBIC) 15 MG tablet Take 1 tablet (15 mg total) by mouth daily as needed for pain. 90 tablet 1   omeprazole (PRILOSEC) 20 MG capsule Take 1 capsule (20 mg total) by mouth 2 (two) times daily before a meal for 14 days. 28 capsule 0   omeprazole (PRILOSEC) 40 MG capsule Take 40 mg by mouth 2 (two) times daily.     PARoxetine (PAXIL) 10 MG tablet Take 1 tablet  (10 mg total) by mouth daily. 90 tablet 1   pregabalin (LYRICA) 100 MG capsule Take 1-2 capsules (100-200 mg total) by mouth at bedtime. 60 capsule 5   triamcinolone cream (KENALOG) 0.1 % Apply 1 application topically 2 (two) times daily. 30 g 0   No current facility-administered medications for this visit.    Allergies  Allergen Reactions   Tizanidine Other (See Comments)    Throat closing and trouble breathing    Lisinopril Cough   Tramadol Itching    Can take with Benadryl    Family History  Family history unknown: Yes    Social History   Socioeconomic History   Marital status: Divorced    Spouse name: Not on file   Number of children: Not on file   Years of education: Not on file   Highest education level: Not on file  Occupational History   Occupation: disability   Tobacco Use   Smoking status: Former    Types: Cigarettes    Quit date: 1970    Years since quitting: 53.9   Smokeless tobacco: Never  Vaping Use   Vaping Use: Never used  Substance and Sexual Activity   Alcohol use: Not Currently   Drug use: Never   Sexual activity: Not Currently  Other Topics Concern   Not on file  Social  History Narrative   Not on file   Social Determinants of Health   Financial Resource Strain: Low Risk  (10/20/2021)   Overall Financial Resource Strain (CARDIA)    Difficulty of Paying Living Expenses: Not hard at all  Food Insecurity: No Food Insecurity (10/20/2021)   Hunger Vital Sign    Worried About Running Out of Food in the Last Year: Never true    Ran Out of Food in the Last Year: Never true  Transportation Needs: No Transportation Needs (10/20/2021)   PRAPARE - Hydrologist (Medical): No    Lack of Transportation (Non-Medical): No  Physical Activity: Insufficiently Active (10/20/2021)   Exercise Vital Sign    Days of Exercise per Week: 2 days    Minutes of Exercise per Session: 20 min  Stress: No Stress Concern Present (10/20/2021)   [place]    Feeling of Stress : Not at all  Social Connections: Socially Isolated (10/20/2021)   Social Connection and Isolation Panel [NHANES]    Frequency of Communication with Friends and Family: More than three times a week    Frequency of Social Gatherings with Friends and Family: Three times a week    Attends Religious Services: Never    Active Member of Clubs or Organizations: No    Attends Archivist Meetings: Never    Marital Status: Divorced  Human resources officer Violence: Not At Risk (10/20/2021)   Humiliation, Afraid, Rape, and Kick questionnaire    Fear of Current or Ex-Partner: No    Emotionally Abused: No    Physically Abused: No    Sexually Abused: No     Constitutional: Pt reports intermittent headache. Denies fever, malaise, fatigue, headache or abrupt weight changes.  HEENT: Denies eye pain, eye redness, ear pain, ringing in the ears, wax buildup, runny nose, nasal congestion, bloody nose, or sore throat. Respiratory: Denies difficulty breathing, shortness of breath, cough or sputum production.   Cardiovascular: Denies chest pain, chest tightness, palpitations or swelling in the hands or feet.  Gastrointestinal: Denies abdominal pain, bloating, constipation, diarrhea or blood in the stool.  GU: Denies urgency, frequency, pain with urination, burning sensation, blood in urine, odor or discharge. Musculoskeletal: Patient reports chronic joint muscle pain.  Denies decrease in range of motion, difficulty with gait, or joint swelling.  Skin: Denies redness, rashes, lesions or ulcercations.  Neurological: Denies dizziness, difficulty with memory, difficulty with speech or problems with balance and coordination.  Psych: Denies anxiety, depression, SI/HI.  No other specific complaints in a complete review of systems (except as listed in HPI above).     Objective:   Physical Exam  BP 134/82 (BP Location: Right  Arm, Patient Position: Sitting, Cuff Size: Normal)   Pulse 66   Temp (!) 96.6 F (35.9 C) (Temporal)   Ht _0  (1.499 m)   Wt 199 lb (90.3 kg)   SpO2 97%   BMI 40.19 kg/m   Wt Readings from Last 3 Encounters:  03/01/22 198 lb (89.8 kg)  01/18/22 198 lb (89.8 kg)  10/25/21 203 lb (92.1 kg)    General: Appears her stated age, obese, in NAD. Skin: Warm, dry and intact. No rashes noted. HEENT: Head: normal shape and size; Eyes: sclera white, no icterus, conjunctiva pink, PERRLA and EOMs intact;  Neck:  Neck supple, trachea midline. No masses, lumps or thyromegaly present.  Cardiovascular: Normal rate and rhythm. S1,S2 noted.  No murmur, rubs  or gallops noted. No JVD or BLE edema. No carotid bruits noted. Pulmonary/Chest: Normal effort and positive vesicular breath sounds. No respiratory distress. No wheezes, rales or ronchi noted.  Abdomen: Normal bowel sounds.  Musculoskeletal: Strength 5/5 BUE/BLE. No difficulty with gait.  Neurological: Alert and oriented. Cranial nerves II-XII grossly intact. Coordination normal.  Psychiatric: Mood and affect normal. Behavior is normal. Judgment and thought content normal.     BMET    Component Value Date/Time   NA 141 12/12/2020 1017   K 4.4 12/12/2020 1017   CL 107 12/12/2020 1017   CO2 24 12/12/2020 1017   GLUCOSE 102 (H) 12/12/2020 1017   BUN 11 12/12/2020 1017   CREATININE 0.63 12/12/2020 1017   CALCIUM 9.1 12/12/2020 1017   GFRNONAA 92 03/22/2020 0911   GFRAA 107 03/22/2020 0911    Lipid Panel     Component Value Date/Time   CHOL 206 (H) 12/12/2020 1017   TRIG 129 12/12/2020 1017   HDL 51 12/12/2020 1017   CHOLHDL 4.0 12/12/2020 1017   VLDL 17 09/08/2020 0914   LDLCALC 131 (H) 12/12/2020 1017    CBC    Component Value Date/Time   WBC 6.6 12/12/2020 1017   RBC 4.53 12/12/2020 1017   HGB 13.5 12/12/2020 1017   HCT 39.9 12/12/2020 1017   PLT 286 12/12/2020 1017   MCV 88.1 12/12/2020 1017   MCH 29.8 12/12/2020 1017    MCHC 33.8 12/12/2020 1017   RDW 13.5 12/12/2020 1017   LYMPHSABS 2,051 03/22/2020 0911   MONOABS 0.8 12/23/2018 1153   EOSABS 73 03/22/2020 0911   BASOSABS 37 03/22/2020 0911    Hgb A1C Lab Results  Component Value Date   HGBA1C 5.7 (H) 12/12/2020           Assessment & Plan:   Preventative Health Maintenance:  She declines flu shot today Tetanus UTD Encouraged her to get her COVID-vaccine Discussed Shingrix vaccine, she will check coverage with her insurance company and get this done at the pharmacy Pap smear UTD Mammogram ordered, she will call to schedule Bone density UTD Colon screening UTD Encouraged her to consume a balanced diet and exercise regimen Advised her to see an eye doctor and dentist annually We will check CBC, c-Met, lipid and A1c today  RTC in 6 months, follow-up chronic conditions Webb Silversmith, NP

## 2022-04-06 NOTE — Assessment & Plan Note (Signed)
Will trial Fioricet

## 2022-04-07 LAB — CBC
HCT: 39.7 % (ref 35.0–45.0)
Hemoglobin: 13.8 g/dL (ref 11.7–15.5)
MCH: 29.4 pg (ref 27.0–33.0)
MCHC: 34.8 g/dL (ref 32.0–36.0)
MCV: 84.5 fL (ref 80.0–100.0)
MPV: 10.1 fL (ref 7.5–12.5)
Platelets: 301 10*3/uL (ref 140–400)
RBC: 4.7 10*6/uL (ref 3.80–5.10)
RDW: 13 % (ref 11.0–15.0)
WBC: 5.5 10*3/uL (ref 3.8–10.8)

## 2022-04-07 LAB — HEMOGLOBIN A1C
Hgb A1c MFr Bld: 5.9 % of total Hgb — ABNORMAL HIGH (ref <5.7)
Mean Plasma Glucose: 123 mg/dL
eAG (mmol/L): 6.8 mmol/L

## 2022-04-07 LAB — COMPLETE METABOLIC PANEL WITH GFR
AG Ratio: 1.3 (calc) (ref 1.0–2.5)
ALT: 14 U/L (ref 6–29)
AST: 14 U/L (ref 10–35)
Albumin: 4.3 g/dL (ref 3.6–5.1)
Alkaline phosphatase (APISO): 63 U/L (ref 37–153)
BUN: 8 mg/dL (ref 7–25)
CO2: 26 mmol/L (ref 20–32)
Calcium: 9.1 mg/dL (ref 8.6–10.4)
Chloride: 107 mmol/L (ref 98–110)
Creat: 0.55 mg/dL (ref 0.50–1.05)
Globulin: 3.3 g/dL (calc) (ref 1.9–3.7)
Glucose, Bld: 93 mg/dL (ref 65–99)
Potassium: 4.6 mmol/L (ref 3.5–5.3)
Sodium: 140 mmol/L (ref 135–146)
Total Bilirubin: 0.3 mg/dL (ref 0.2–1.2)
Total Protein: 7.6 g/dL (ref 6.1–8.1)
eGFR: 103 mL/min/{1.73_m2} (ref >=60)

## 2022-04-07 LAB — LIPID PANEL
Cholesterol: 218 mg/dL — ABNORMAL HIGH (ref <200)
HDL: 47 mg/dL — ABNORMAL LOW (ref >=50)
LDL Cholesterol (Calc): 152 mg/dL (calc) — ABNORMAL HIGH
Non-HDL Cholesterol (Calc): 171 mg/dL (calc) — ABNORMAL HIGH (ref <130)
Total CHOL/HDL Ratio: 4.6 (calc) (ref <5.0)
Triglycerides: 88 mg/dL (ref <150)

## 2022-04-07 LAB — CARBAMAZEPINE LEVEL, TOTAL: Carbamazepine Lvl: <2 mg/L — ABNORMAL LOW (ref 4.0–12.0)

## 2022-04-27 ENCOUNTER — Telehealth: Payer: Self-pay

## 2022-04-27 NOTE — Telephone Encounter (Signed)
[employer] (Key: [id number]) Repatha SureClick '140MG'$ /ML auto-injectors  Please wait for [employer] 2017 NCPDP to return a determination.

## 2022-06-19 ENCOUNTER — Encounter: Payer: Medicare Other | Admitting: Student in an Organized Health Care Education/Training Program

## 2022-06-22 ENCOUNTER — Other Ambulatory Visit: Payer: Self-pay | Admitting: Internal Medicine

## 2022-06-22 DIAGNOSIS — J302 Other seasonal allergic rhinitis: Secondary | ICD-10-CM

## 2022-06-22 NOTE — Telephone Encounter (Signed)
Requested medications are due for refill today.  yes  Requested medications are on the active medications list.  yes  Last refill. 12/21/2019  Future visit scheduled.   yes  Notes to clinic.  Rx signed by Cyndia Skeeters.    Requested Prescriptions  Pending Prescriptions Disp Refills   fluticasone (FLONASE) 50 MCG/ACT nasal spray 9.9 mL 1    Sig: Place 1 spray into both nostrils 2 (two) times daily.     Ear, Nose, and Throat: Nasal Preparations - Corticosteroids Passed - 06/22/2022  2:35 PM      Passed - Valid encounter within last 12 months    Recent Outpatient Visits           2 months ago Encounter for general adult medical examination with abnormal findings   [facility] [place], Coralie Keens, NP   1 year ago Nipple discharge   [facility] [place], Coralie Keens, NP   1 year ago Primary insomnia   [facility] [place], Coralie Keens, NP   1 year ago Encounter for general adult medical examination with abnormal findings   [facility] [place], Coralie Keens, NP   2 years ago Hypertension, unspecified type   [facility] [place], Lupita Raider, FNP       Future Appointments             In 3 months [place], Coralie Keens, NP [facility], [facility]

## 2022-06-22 NOTE — Telephone Encounter (Signed)
Medication Refill - Medication: fluticasone (FLONASE) 50 MCG/ACT nasal spray [[id number]]   Has the patient contacted their pharmacy? Yes.   (Agent: If no, request that the patient contact the pharmacy for the refill. If patient does not wish to contact the pharmacy document the reason why and proceed with request.) (Agent: If yes, when and what did the pharmacy advise?)  Preferred Pharmacy (with phone number or street name):  [place], [place] - [address]  [place] [address]  Phone: [phone] Fax: [phone]  Hours: Not open 24 hours   Has the patient been seen for an appointment in the last year OR does the patient have an upcoming appointment? Yes.    Agent: Please be advised that RX refills may take up to 3 business days. We ask that you follow-up with your pharmacy.

## 2022-06-25 ENCOUNTER — Ambulatory Visit: Payer: Self-pay | Admitting: *Deleted

## 2022-06-25 MED ORDER — FLUTICASONE PROPIONATE 50 MCG/ACT NA SUSP: 1.0000 | Freq: Two times a day (BID) | NASAL | 1 refills | Status: AC

## 2022-06-25 NOTE — Patient Outreach (Signed)
  Care Coordination   06/25/2022 Name: Kristin Porter MRN: [id number] DOB: 1960/06/05   Care Coordination Outreach Attempts:  An unsuccessful telephone outreach was attempted for a scheduled appointment today.  Follow Up Plan:  Additional outreach attempts will be made to offer the patient care coordination information and services.   Encounter Outcome:  No Answer   Care Coordination Interventions:  No, not indicated    Valente David, RN, MSN, [facility] Care Management Care Management Coordinator [phone]

## 2022-06-25 NOTE — Patient Outreach (Signed)
  Care Coordination   Follow Up Visit Note   06/25/2022 Name: Kristin Porter MRN: [id number] DOB: 11-07-1960  Kristin Porter is a 62 y.o. year old female who sees [place], Coralie Keens, NP for primary care. I spoke with  Kristin Porter by phone today.  What matters to the patients health and wellness today?  Report she has had some sinus pressure and congestion over the last month.  State it is better now.     Goals Addressed             This Visit's Progress    Relief of congestion       Care Coordination Interventions: Evaluation of current treatment plan related to sinus congestion and pressure and patient's adherence to plan as established by provider Advised patient to use Flonase as instructed.  Also advised to use steam from shower to open nasal passages Reviewed medications with patient and discussed Obtaining Flonase and use of Sinus medication as needed Reviewed scheduled/upcoming provider appointments including PCP on 5/17 and [facility] on 6/7      RNCM: Effective Management of Pain   On track    Care Coordination Interventions:  Reviewed provider established plan for pain management. Sees specialist on a regular basis.  Discussed importance of adherence to all scheduled medical appointments Canceled appointment with pain management clinic last week due to not feeling well, will call to reschedule Counseled on the importance of reporting any/all new or changed pain symptoms or management strategies to pain management provider. Education provided Advised patient to report to care team affect of pain on daily activities. The patient is unable to do exercises Discussed use of relaxation techniques and/or diversional activities to assist with pain reduction (distraction, imagery, relaxation, massage, acupressure, TENS, heat, and cold application. Discussed journal writing, light therapy, music therapy, mindfulness, wax therapy. The patient is open to ideas to help with  effective management of her pain.  Reviewed with patient prescribed pharmacological and nonpharmacological pain relief strategies.  State she is taking Lyrica and Belbuca but state she has intense pain when medication wear off.  She will discuss with provider during next visit Advised patient to discuss unresolved pain, changes in level or intensity of pain with provider          SDOH assessments and interventions completed:  No     Care Coordination Interventions:  Yes, provided   Follow up plan: Follow up call scheduled for 3/5    Encounter Outcome:  Pt. Visit Completed   Kristin David, RN, MSN, [place] Care Management Care Management Coordinator [phone]

## 2022-07-03 ENCOUNTER — Encounter: Payer: Self-pay | Admitting: Student in an Organized Health Care Education/Training Program

## 2022-07-03 ENCOUNTER — Ambulatory Visit
Payer: 59 | Attending: Student in an Organized Health Care Education/Training Program | Admitting: Student in an Organized Health Care Education/Training Program

## 2022-07-03 VITALS — BP 151/96 | HR 73 | Temp 97.5°F | Resp 17 | Ht 59.0 in | Wt 193.0 lb

## 2022-07-03 DIAGNOSIS — M47816 Spondylosis without myelopathy or radiculopathy, lumbar region: Secondary | ICD-10-CM | POA: Insufficient documentation

## 2022-07-03 DIAGNOSIS — G8929 Other chronic pain: Secondary | ICD-10-CM | POA: Diagnosis not present

## 2022-07-03 DIAGNOSIS — G950 Syringomyelia and syringobulbia: Secondary | ICD-10-CM | POA: Diagnosis not present

## 2022-07-03 DIAGNOSIS — M47817 Spondylosis without myelopathy or radiculopathy, lumbosacral region: Secondary | ICD-10-CM | POA: Diagnosis not present

## 2022-07-03 DIAGNOSIS — G894 Chronic pain syndrome: Secondary | ICD-10-CM | POA: Insufficient documentation

## 2022-07-03 DIAGNOSIS — M545 Low back pain, unspecified: Secondary | ICD-10-CM

## 2022-07-03 DIAGNOSIS — M25551 Pain in right hip: Secondary | ICD-10-CM | POA: Diagnosis not present

## 2022-07-03 DIAGNOSIS — M25552 Pain in left hip: Secondary | ICD-10-CM | POA: Diagnosis not present

## 2022-07-03 DIAGNOSIS — M7918 Myalgia, other site: Secondary | ICD-10-CM | POA: Diagnosis not present

## 2022-07-03 DIAGNOSIS — M797 Fibromyalgia: Secondary | ICD-10-CM | POA: Insufficient documentation

## 2022-07-03 MED ORDER — PREGABALIN 100 MG PO CAPS: 100.0000 mg | ORAL_CAPSULE | Freq: Two times a day (BID) | ORAL | 5 refills | Status: DC

## 2022-07-03 MED ORDER — TRAMADOL HCL ER 100 MG PO TB24: 100.0000 mg | ORAL_TABLET | Freq: Every day | ORAL | 2 refills | Status: AC

## 2022-07-03 MED ORDER — TRAMADOL HCL 50 MG PO TABS: 50.0000 mg | ORAL_TABLET | Freq: Two times a day (BID) | ORAL | 2 refills | Status: AC | PRN

## 2022-07-03 NOTE — Progress Notes (Signed)
Nursing Pain Medication Assessment:  Safety precautions to be maintained throughout the outpatient stay will include: orient to surroundings, keep bed in low position, maintain call bell within reach at all times, provide assistance with transfer out of bed and ambulation.  Medication Inspection Compliance: Pill count conducted under aseptic conditions, in front of the patient. Neither the pills nor the bottle was removed from the patient's sight at any time. Once count was completed pills were immediately returned to the patient in their original bottle.  Medication: Buprenorphine (Suboxone) Pill/Patch Count:  0 of 60 pills remain Pill/Patch Appearance:  no patches to count Bottle Appearance: Standard pharmacy container. Clearly labeled. Filled Date: 10 / 13 / 2023 Last Medication intake:   11/23

## 2022-07-03 NOTE — Progress Notes (Signed)
PROVIDER NOTE: Information contained herein reflects review and annotations entered in association with encounter. Interpretation of such information and data should be left to medically-trained personnel. Information provided to patient can be located elsewhere in the medical record under "Patient Instructions". Document created using STT-dictation technology, any transcriptional errors that may result from process are unintentional.    Patient: Kristin Porter  Service Category: E/M  Provider: Gillis Santa, MD  DOB: [birth year]  DOS: 07/03/2022  Specialty: Interventional Pain Management  MRN: [id number]  Setting: Ambulatory outpatient  PCP: Jearld Fenton, NP  Type: Established Patient    Referring Provider: Jearld Fenton, NP  Location: Office  Delivery: Face-to-face     HPI  [name], a 62 y.o. year old female, is here today because of her Lumbosacral spondylosis without myelopathy [M47.817]. [name] primary complain today is Back Pain (lower) Last encounter: My last encounter with her was on 03/01/22 Pertinent problems: [name] has Fibromyalgia; Lumbosacral spondylosis without myelopathy; and Chronic pain syndrome on their pertinent problem list. Pain Assessment: Severity of Chronic pain is reported as a 8 /10. Location: Back Lower/to legs bilat. Onset: More than a month ago. Quality: Sore. Timing: Constant. Modifying factor(s): meds. Vitals:  height is 4' 11"$  (1.499 m) and weight is 193 lb (87.5 kg). Her temporal temperature is 97.5 F (36.4 C) (abnormal). Her blood pressure is 151/96 (abnormal) and her pulse is 73. Her respiration is 17 and oxygen saturation is 98%.   Reason for encounter:   Diffuse myalgias and arthralgias related to fibromyalgia States that she has been having trouble getting Belbuca filled Will transition back to Tramadol- discussed ER Tramadol for long acting pain control and IR Tramadol for breakthrough pain Continue Lyrica and Baclofen as  prescribed  Pharmacotherapy Assessment  Analgesic: Tramadol 100 mg ER daily, Tramadol 50 mg BID prn breakthrough pain  Monitoring: [place] PMP: PDMP reviewed during this encounter.       Pharmacotherapy: No side-effects or adverse reactions reported. Compliance: No problems identified. Effectiveness: Clinically acceptable.  Rise Patience, RN  07/03/2022  1:40 PM  Sign when Signing Visit Nursing Pain Medication Assessment:  Safety precautions to be maintained throughout the outpatient stay will include: orient to surroundings, keep bed in low position, maintain call bell within reach at all times, provide assistance with transfer out of bed and ambulation.  Medication Inspection Compliance: Pill count conducted under aseptic conditions, in front of the patient. Neither the pills nor the bottle was removed from the patient's sight at any time. Once count was completed pills were immediately returned to the patient in their original bottle.  Medication: Buprenorphine (Suboxone) Pill/Patch Count:  0 of 60 pills remain Pill/Patch Appearance:  no patches to count Bottle Appearance: Standard pharmacy container. Clearly labeled. Filled Date: 75 / 13 / 2023 Last Medication intake:   11/23     UDS:  Summary  Date Value Ref Range Status  10/13/2020 Note  Final    Comment:    ==================================================================== [employer] Select 13 (MW) ==================================================================== Test                             Result       Flag       Units  Drug Present and Declared for Prescription Verification   Hydrocodone                    697  EXPECTED   ng/mg creat   Hydromorphone                  365          EXPECTED   ng/mg creat   Dihydrocodeine                 240          EXPECTED   ng/mg creat   Norhydrocodone                 620          EXPECTED   ng/mg creat    Sources of hydrocodone include scheduled prescription medications.     Hydromorphone, dihydrocodeine and norhydrocodone are expected    metabolites of hydrocodone. Hydromorphone and dihydrocodeine are    also available as scheduled prescription medications.  Drug Present not Declared for Prescription Verification   Oxazepam                       21           UNEXPECTED ng/mg creat   Temazepam                      15           UNEXPECTED ng/mg creat    Oxazepam and temazepam are expected metabolites of diazepam.    Oxazepam is also an expected metabolite of other benzodiazepine    drugs, including chlordiazepoxide, prazepam, clorazepate, halazepam,    and temazepam.  Oxazepam and temazepam are available as scheduled    prescription medications.  ==================================================================== Test                      Result    Flag   Units      Ref Range   Creatinine              149              mg/dL      >=20 ==================================================================== Declared Medications:  The flagging and interpretation on this report are based on the  following declared medications.  Unexpected results may arise from  inaccuracies in the declared medications.   **Note: The testing scope of this panel includes these medications:   Hydrocodone   **Note: The testing scope of this panel does not include the  following reported medications:   Acetaminophen  Albuterol  Amlodipine  Baclofen  Evolocumab (Repatha)  Fluticasone  Hydrochlorothiazide  Meloxicam  Omeprazole  Paroxetine  Pregabalin ==================================================================== For clinical consultation, please call [phone]. ====================================================================      ROS  Constitutional: Denies any fever or chills Gastrointestinal: No reported hemesis, hematochezia, vomiting, or acute GI distress Musculoskeletal:  Low back pain, worse with facet loading and lumbar extension Neurological:  No reported episodes of acute onset apraxia, aphasia, dysarthria, agnosia, amnesia, paralysis, loss of coordination, or loss of consciousness  Medication Review  Blood Pressure Kit, PARoxetine, Suvorexant, albuterol, amLODipine, baclofen, butalbital-acetaminophen-caffeine, fluticasone, meloxicam, omeprazole, pregabalin, traMADol, and triamcinolone cream  History Review  Allergy: [name] is allergic to tizanidine, lisinopril, and tramadol. Drug: [name]  reports no history of drug use. Alcohol:  reports that she does not currently use alcohol. Tobacco:  reports that she quit smoking about 54 years ago. Her smoking use included cigarettes. She has never used smokeless tobacco. Social: [name]  reports that she quit smoking about 54 years ago.  Her smoking use included cigarettes. She has never used smokeless tobacco. She reports that she does not currently use alcohol. She reports that she does not use drugs. Medical:  has a past medical history of Arthritis, Fibromyalgia, Hypertension, and Neuropathy. Surgical: [name]  has a past surgical history that includes Breast biopsy (Right, 2018); Esophagogastroduodenoscopy (egd) with propofol (N/A, 02/22/2021); and Breast biopsy (Left, 07/12/2021). Family: Family history is unknown by patient.  Laboratory Chemistry Profile   Renal Lab Results  Component Value Date   BUN 8 04/06/2022   CREATININE 0.55 04/06/2022   BCR SEE NOTE: 04/06/2022   GFRAA 107 03/22/2020   GFRNONAA 92 03/22/2020    Hepatic Lab Results  Component Value Date   AST 14 04/06/2022   ALT 14 04/06/2022    Electrolytes Lab Results  Component Value Date   NA 140 04/06/2022   K 4.6 04/06/2022   CL 107 04/06/2022   CALCIUM 9.1 04/06/2022    Bone No results found for: "VD25OH", "VD125OH2TOT", "IA:875833", "IJ:5854396", "25OHVITD1", "25OHVITD2", "25OHVITD3", "TESTOFREE", "TESTOSTERONE"  Inflammation (CRP: Acute Phase) (ESR: Chronic Phase) Lab Results   Component Value Date   CRP 15.2 (H) 12/12/2020   ESRSEDRATE 34 (H) 12/12/2020         Note: Above Lab results reviewed.  Recent Imaging Review  MR LT BREAST BX W LOC DEV 1ST LESION IMAGE BX SPEC MR GUIDE Addendum: ADDENDUM REPORT: 10/31/2021 18:56   ADDENDUM:  Pathology revealed Breast, LEFT, needle core biopsy, anterior, upper  outer quadrant (barbell clip)- SCLEROSED INTRADUCTAL PAPILLOMA WITH  USUAL DUCTAL HYPERPLASIA AND CALCIFICATIONS AND ASSOCIATED BENIGN  BREAST TISSUE WITH DUCTULAR DILATATION/ECTASIA AND BACKGROUND  FIBROCYSTIC CHANGE WITH APOCRINE METAPLASIA. This was found to be  concordant by Dr. Claudie Revering, with surgical consultation for  excision recommended per protocol- given patient symptomatic and  age.   Pathology results were discussed with the patient by telephone. The  patient reported doing well after the biopsy with tenderness at the  site. Post biopsy instructions and care were reviewed and questions  were answered. The patient was encouraged to call [facility] for any additional concerns.   Per patient request, surgical consultation has been arranged with  Dr. Hervey Porter at [facility] on November 07, 2021.   Pathology results reported by Stacie Acres RN on 10/30/2021.   Electronically Signed    By: Claudie Revering M.D.    On: 10/31/2021 18:56 Narrative: CLINICAL DATA:  6 mm mass seen in the anterior upper outer left breast on a bilateral breast MRI performed for spontaneous bloody left nipple discharge.  EXAM: MRI GUIDED CORE NEEDLE BIOPSY OF THE LEFT BREAST  TECHNIQUE: Multiplanar, multisequence MR imaging of the left breast was performed both before and after administration of intravenous contrast.  CONTRAST:  9 cc Gadavist  COMPARISON:  Bilateral breast MRI dated 08/16/2021  FINDINGS: I met with the patient, and we discussed the procedure of MRI guided biopsy, including risks, benefits, and  alternatives. Specifically, we discussed the risks of infection, bleeding, tissue injury, clip migration, and inadequate sampling. Informed, written consent was given. The usual time out protocol was performed immediately prior to the procedure.  Using sterile technique, 1% Lidocaine, MRI guidance, and a 9 gauge vacuum assisted device, biopsy was performed of the recently demonstrated 6 mm mass in the anterior upper outer left breast using a lateral approach. At the conclusion of the procedure, a barbell shaped tissue marker clip was deployed into the  biopsy cavity. Follow-up 2-view mammogram was performed and dictated separately.  IMPRESSION: MRI guided biopsy of a 6 mm left breast mass. No apparent complications.  Electronically Signed: By: Claudie Revering M.D. On: 10/26/2021 09:06 Note: Reviewed        Physical Exam  General appearance: Well nourished, well developed, and well hydrated. In no apparent acute distress Mental status: Alert, oriented x 3 (person, place, & time)       Respiratory: No evidence of acute respiratory distress Eyes: PERLA Vitals: BP (!) 151/96   Pulse 73   Temp (!) 97.5 F (36.4 C) (Temporal)   Resp 17   Ht 4' 11"$  (1.499 m)   Wt 193 lb (87.5 kg)   SpO2 98%   BMI 38.98 kg/m  BMI: Estimated body mass index is 38.98 kg/m as calculated from the following:   Height as of this encounter: 4' 11"$  (1.499 m).   Weight as of this encounter: 193 lb (87.5 kg). Ideal: Patient must be at least 60 in tall to calculate ideal body weight  Lumbar Spine Area Exam  Skin & Axial Inspection: No masses, redness, or swelling Alignment: Symmetrical Functional ROM: Pain restricted ROM affecting both sides Stability: No instability detected Muscle Tone/Strength: Functionally intact. No obvious neuro-muscular anomalies detected. Sensory (Neurological): Musculoskeletal pain pattern Palpation: No palpable anomalies       Provocative Tests: Hyperextension/rotation test:  (+) bilaterally for facet joint pain.  Gait & Posture Assessment  Ambulation: Unassisted Gait: Relatively normal for age and body habitus Posture: WNL   Lower Extremity Exam    Side: Right lower extremity  Side: Left lower extremity  Stability: No instability observed          Stability: No instability observed          Skin & Extremity Inspection: Skin color, temperature, and hair growth are WNL. No peripheral edema or cyanosis. No masses, redness, swelling, asymmetry, or associated skin lesions. No contractures.  Skin & Extremity Inspection: Skin color, temperature, and hair growth are WNL. No peripheral edema or cyanosis. No masses, redness, swelling, asymmetry, or associated skin lesions. No contractures.  Functional ROM: Pain restricted ROM for hip joint          Functional ROM: Pain restricted ROM for hip joint          Muscle Tone/Strength: Functionally intact. No obvious neuro-muscular anomalies detected.  Muscle Tone/Strength: Functionally intact. No obvious neuro-muscular anomalies detected.  Sensory (Neurological): Musculoskeletal pain pattern        Sensory (Neurological): Musculoskeletal pain pattern        DTR: Patellar: deferred today Achilles: deferred today Plantar: deferred today  DTR: Patellar: deferred today Achilles: deferred today Plantar: deferred today  Palpation: No palpable anomalies  Palpation: No palpable anomalies    Assessment   Status Diagnosis  Controlled Controlled Controlled 1. Lumbosacral spondylosis without myelopathy   2. Lumbar facet joint syndrome   3. Chronic bilateral low back pain without sciatica   4. Fibromyalgia   5. Syrinx of spinal cord ([place])   6. Chronic pain of both hips   7. Chronic pain syndrome   8. Myofascial pain syndrome of lumbar spine        Plan of Care    [name] has a current medication list which includes the following long-term medication(s): albuterol, amlodipine, fluticasone, omeprazole,  paroxetine, and pregabalin.   Pharmacotherapy (Medications Ordered): Meds ordered this encounter  Medications   traMADol (ULTRAM-ER) 100 MG 24 hr tablet  Sig: Take 1 tablet (100 mg total) by mouth daily.    Dispense:  30 tablet    Refill:  2   traMADol (ULTRAM) 50 MG tablet    Sig: Take 1 tablet (50 mg total) by mouth every 12 (twelve) hours as needed for severe pain. Must last 30 days    Dispense:  60 tablet    Refill:  2    Chronic Pain: STOP Act (Not applicable) Fill 1 day early if closed on refill date. Avoid benzodiazepines within 8 hours of opioids   pregabalin (LYRICA) 100 MG capsule    Sig: Take 1-2 capsules (100-200 mg total) by mouth 2 (two) times daily.    Dispense:  60 capsule    Refill:  5    Fill one day early if pharmacy is closed on scheduled refill date. May substitute for generic if available.  Continue Baclofen as prescribed  Follow-up plan:   Return in about 3 months (around 10/01/2022) for Medication Management.    Recent Visits No visits were found meeting these conditions. Showing recent visits within past 90 days and meeting all other requirements Today's Visits Date Type Provider Dept  07/03/22 Office Visit Gillis Santa, MD [facility]  Showing today's visits and meeting all other requirements Future Appointments No visits were found meeting these conditions. Showing future appointments within next 90 days and meeting all other requirements  I discussed the assessment and treatment plan with the patient. The patient was provided an opportunity to ask questions and all were answered. The patient agreed with the plan and demonstrated an understanding of the instructions.  Patient advised to call back or seek an in-person evaluation if the symptoms or condition worsens.  Duration of encounter: 69mnutes.  Note by: BGillis Santa MD Date: 07/03/2022; Time: 2:08 PM

## 2022-07-24 ENCOUNTER — Ambulatory Visit: Payer: Self-pay | Admitting: *Deleted

## 2022-07-24 NOTE — Patient Outreach (Signed)
  Care Coordination   07/24/2022 Name: Kristin Porter MRN: [id number] DOB: November 09, 1960   Care Coordination Outreach Attempts:  An unsuccessful telephone outreach was attempted for a scheduled appointment today.  Follow Up Plan:  Additional outreach attempts will be made to offer the patient care coordination information and services.   Encounter Outcome:  No Answer   Care Coordination Interventions:  No, not indicated    Valente David, RN, MSN, [facility] Care Management Care Management Coordinator [phone]

## 2022-07-31 ENCOUNTER — Telehealth: Payer: Self-pay | Admitting: *Deleted

## 2022-07-31 NOTE — Progress Notes (Signed)
  Care Coordination Note  07/31/2022 Name: ANJEL PERFETTI MRN: [id number] DOB: 1961/01/14  Avice Herma Ard is a 62 y.o. year old female who is a primary care patient of Jearld Fenton, NP and is actively engaged with the care management team. I reached out to [employer] by phone today to assist with re-scheduling a follow up visit with the RN Case Manager  Follow up plan: Telephone appointment with care management team member scheduled for: 08/16/2022  Julian Hy, [place] Direct Dial: [phone]

## 2022-07-31 NOTE — Progress Notes (Signed)
  Care Coordination Note  07/31/2022 Name: Kristin Porter MRN: [id number] DOB: 1960-11-06  Kristin Porter is a 62 y.o. year old female who is a primary care patient of Garnette Gunner, Coralie Keens, NP and is actively engaged with the care management team. I reached out to [employer] by phone today to assist with re-scheduling a follow up visit with the RN Case Manager  Follow up plan: Unsuccessful telephone outreach attempt made. A HIPAA compliant phone message was left for the patient providing contact information and requesting a return call.   Julian Hy, [place] Direct Dial: [phone]

## 2022-08-16 ENCOUNTER — Ambulatory Visit: Payer: Self-pay | Admitting: *Deleted

## 2022-08-16 NOTE — Patient Outreach (Deleted)
{  CARE COORDINATION NOTES:[id number]} 

## 2022-08-16 NOTE — Patient Instructions (Signed)
Visit Information  Thank you for taking time to visit with me today. Please don't hesitate to contact me if I can be of assistance to you before our next scheduled telephone appointment.  Following are the goals we discussed today:  Use eye drops to see if you have relief.  If no relief or gets worse, call PCP office.   Our next appointment is by telephone on 5/9  Please call the care guide team at [phone] if you need to cancel or reschedule your appointment.   Please call the Suicide and Crisis Lifeline: 988 call the [place] National Suicide Prevention Lifeline: (272)827-5692 or TTY: (952)181-9573 TTY 585-159-3618) to talk to a trained counselor call 1-800-273-TALK (toll free, 24 hour hotline) call 911 if you are experiencing a Mental Health or [place] or need someone to talk to.  Patient verbalizes understanding of instructions and care plan provided today and agrees to view in [place]. Active MyChart status and patient understanding of how to access instructions and care plan via MyChart confirmed with patient.     The patient has been provided with contact information for the care management team and has been advised to call with any health related questions or concerns.   [place] Management Care Management Coordinator [phone]

## 2022-08-16 NOTE — Patient Outreach (Signed)
  Care Coordination   08/16/2022 Name: Kristin Porter MRN: [id number] DOB: August 25, 1960   Care Coordination Outreach Attempts:  An unsuccessful telephone outreach was attempted for a scheduled appointment today.  Follow Up Plan:  Additional outreach attempts will be made to offer the patient care coordination information and services.   Encounter Outcome:  No Answer   Care Coordination Interventions:  No, not indicated    Valente David, RN, MSN, [facility] Care Management Care Management Coordinator [phone]

## 2022-08-16 NOTE — Patient Outreach (Signed)
  Care Coordination   Follow Up Visit Note   08/16/2022 Name: SARAHROSE BELILES MRN: [id number] DOB: [birth year]  Fae Herma Ard is a 62 y.o. year old female who sees [place], Coralie Keens, NP for primary care. I spoke with  Chenita Herma Ard by phone today.  What matters to the patients health and wellness today?  Pain management, state it [place] usually worse during season changes.      Goals Addressed             This Visit's Progress    Relief of congestion   On track    Care Coordination Interventions: Evaluation of current treatment plan related to sinus congestion and pressure and patient's adherence to plan as established by provider Advised patient to use Flonase as instructed.  Also advised to use steam from shower to open nasal passages Reviewed medications with patient and discussed Obtaining Flonase and use of Sinus medication as needed Reviewed scheduled/upcoming provider appointments including PCP on 5/17 and [facility] on 6/7 Congestion is better, now having pain and drainage from eyes.  Feels this may be sinus/allergy related, will use eye drops to see if she gets relief.       RNCM: Effective Management of Pain   On track    Care Coordination Interventions:  Reviewed provider established plan for pain management. Sees specialist on a regular basis.  Discussed importance of adherence to all scheduled medical appointments Canceled appointment with pain management clinic last week due to not feeling well, will call to reschedule Counseled on the importance of reporting any/all new or changed pain symptoms or management strategies to pain management provider. Education provided Advised patient to report to care team affect of pain on daily activities. The patient is unable to do exercises Discussed use of relaxation techniques and/or diversional activities to assist with pain reduction (distraction, imagery, relaxation, massage, acupressure, TENS, heat, and cold application.  Discussed journal writing, light therapy, music therapy, mindfulness, wax therapy. The patient is open to ideas to help with effective management of her pain.  Reviewed with patient prescribed pharmacological and nonpharmacological pain relief strategies.  Now using Tramadol extended release and for breakthrough pain, however continue to rate 8/10 (6 is reported bearable).  Working on [employer] during peak season change times.   Advised patient to discuss unresolved pain, changes in level or intensity of pain with provider Follow up with pain clinic on 5/7, will discuss autoimmune therapy for treatment versus regular pain medications          SDOH assessments and interventions completed:  No     Care Coordination Interventions:  Yes, provided   Interventions Today    Flowsheet Row Most Recent Value  Chronic Disease   Chronic disease during today's visit Other  [fibromyalgia]  General Interventions   General Interventions Discussed/Reviewed General Interventions Reviewed, Doctor Visits  Doctor Visits Discussed/Reviewed Doctor Visits Reviewed, PCP, Specialist  PCP/Specialist Visits Compliance with follow-up visit        Follow up plan: Follow up call scheduled for 5/9    Encounter Outcome:  Pt. Visit Completed   Valente David, RN, MSN, [place] Care Management Care Management Coordinator [phone]

## 2022-08-22 DIAGNOSIS — I719 Aortic aneurysm of unspecified site, without rupture: Secondary | ICD-10-CM | POA: Diagnosis not present

## 2022-08-22 DIAGNOSIS — G894 Chronic pain syndrome: Secondary | ICD-10-CM | POA: Diagnosis not present

## 2022-08-22 DIAGNOSIS — G8929 Other chronic pain: Secondary | ICD-10-CM | POA: Diagnosis not present

## 2022-08-22 DIAGNOSIS — J449 Chronic obstructive pulmonary disease, unspecified: Secondary | ICD-10-CM | POA: Diagnosis not present

## 2022-08-22 DIAGNOSIS — I3139 Other pericardial effusion (noninflammatory): Secondary | ICD-10-CM | POA: Diagnosis not present

## 2022-08-22 DIAGNOSIS — I2510 Atherosclerotic heart disease of native coronary artery without angina pectoris: Secondary | ICD-10-CM | POA: Diagnosis not present

## 2022-08-22 DIAGNOSIS — I5032 Chronic diastolic (congestive) heart failure: Secondary | ICD-10-CM | POA: Diagnosis not present

## 2022-08-22 DIAGNOSIS — I739 Peripheral vascular disease, unspecified: Secondary | ICD-10-CM | POA: Diagnosis not present

## 2022-08-22 DIAGNOSIS — I25119 Atherosclerotic heart disease of native coronary artery with unspecified angina pectoris: Secondary | ICD-10-CM | POA: Diagnosis not present

## 2022-08-22 DIAGNOSIS — I5030 Unspecified diastolic (congestive) heart failure: Secondary | ICD-10-CM | POA: Diagnosis not present

## 2022-08-22 DIAGNOSIS — I7121 Aneurysm of the ascending aorta, without rupture: Secondary | ICD-10-CM | POA: Diagnosis not present

## 2022-08-22 DIAGNOSIS — I10 Essential (primary) hypertension: Secondary | ICD-10-CM | POA: Diagnosis not present

## 2022-08-22 DIAGNOSIS — M797 Fibromyalgia: Secondary | ICD-10-CM | POA: Diagnosis not present

## 2022-08-22 DIAGNOSIS — R9431 Abnormal electrocardiogram [ECG] [EKG]: Secondary | ICD-10-CM | POA: Diagnosis not present

## 2022-08-29 ENCOUNTER — Other Ambulatory Visit: Payer: Self-pay | Admitting: Internal Medicine

## 2022-08-29 DIAGNOSIS — N63 Unspecified lump in unspecified breast: Secondary | ICD-10-CM

## 2022-09-14 ENCOUNTER — Ambulatory Visit
Admission: RE | Admit: 2022-09-14 | Discharge: 2022-09-14 | Disposition: A | Discharge status: Home / Self Care | Payer: 59 | Source: Ambulatory Visit | Attending: Internal Medicine | Admitting: Internal Medicine

## 2022-09-14 ENCOUNTER — Telehealth: Payer: Self-pay | Admitting: Internal Medicine

## 2022-09-14 ENCOUNTER — Other Ambulatory Visit: Payer: Self-pay | Admitting: Internal Medicine

## 2022-09-14 DIAGNOSIS — Z1239 Encounter for other screening for malignant neoplasm of breast: Secondary | ICD-10-CM | POA: Insufficient documentation

## 2022-09-14 DIAGNOSIS — N6452 Nipple discharge: Secondary | ICD-10-CM | POA: Diagnosis not present

## 2022-09-14 DIAGNOSIS — N6489 Other specified disorders of breast: Secondary | ICD-10-CM | POA: Diagnosis not present

## 2022-09-14 DIAGNOSIS — N63 Unspecified lump in unspecified breast: Secondary | ICD-10-CM | POA: Diagnosis not present

## 2022-09-14 DIAGNOSIS — N6001 Solitary cyst of right breast: Secondary | ICD-10-CM | POA: Insufficient documentation

## 2022-09-14 NOTE — Telephone Encounter (Signed)
OK for ultrasound. 

## 2022-09-14 NOTE — Telephone Encounter (Signed)
Megan advised.   Thanks,   -Vernona Rieger

## 2022-09-14 NOTE — Telephone Encounter (Signed)
Jessica with [facility] is calling in because they are doing a following up Mammogram on pt and found something on her right breast and they needed Verbal Orders from a provider or nurse. Please follow up with Shanda Bumps.

## 2022-09-17 ENCOUNTER — Ambulatory Visit: Payer: 59 | Admitting: Pharmacist

## 2022-09-17 DIAGNOSIS — I10 Essential (primary) hypertension: Secondary | ICD-10-CM

## 2022-09-17 NOTE — Progress Notes (Signed)
Outreach Note  09/17/2022 Name: Kristin Porter MRN: [id number] DOB: Oct 27, 1960  I connected with Kristin Porter on 09/17/22 by telephone outreach and verified that I am speaking with the correct person using two identifiers.  Patient appearing on report for [place] Metric Hypertension Control due to last documented ambulatory blood pressure of 151/96 on 07/03/2022. Next appointment with PCP is 10/05/2022.   Outreached patient to discuss hypertension control and medication management.   Outpatient Encounter Medications as of 09/17/2022  Medication Sig   albuterol (PROVENTIL) (2.5 MG/3ML) 0.083% nebulizer solution Take 3 mLs (2.5 mg total) by nebulization every 6 (six) hours as needed for wheezing or shortness of breath.   amLODipine (NORVASC) 10 MG tablet Take 1 tablet (10 mg total) by mouth daily. (Patient not taking: Reported on 09/17/2022)   baclofen (LIORESAL) 10 MG tablet Take 1 tablet (10 mg total) by mouth 4 (four) times daily as needed for muscle spasms.   Blood Pressure Monitoring (BLOOD PRESSURE KIT) DEVI 1 Units by Does not apply route in the morning and at bedtime.   butalbital-acetaminophen-caffeine (FIORICET) 50-325-40 MG tablet Take 1 tablet by mouth every 6 (six) hours as needed for headache.   fluticasone (FLONASE) 50 MCG/ACT nasal spray Place 1 spray into both nostrils 2 (two) times daily.   meloxicam (MOBIC) 15 MG tablet Take 1 tablet (15 mg total) by mouth daily as needed for pain.   omeprazole (PRILOSEC) 40 MG capsule Take 1 capsule (40 mg total) by mouth 2 (two) times daily.   PARoxetine (PAXIL) 10 MG tablet Take 1 tablet (10 mg total) by mouth daily.   pregabalin (LYRICA) 100 MG capsule Take 1-2 capsules (100-200 mg total) by mouth 2 (two) times daily.   Suvorexant (BELSOMRA) 5 MG TABS Take 1 tablet by mouth at bedtime.   traMADol (ULTRAM) 50 MG tablet Take 1 tablet (50 mg total) by mouth every 12 (twelve) hours as needed for severe pain. Must last 30 days    traMADol (ULTRAM-ER) 100 MG 24 hr tablet Take 1 tablet (100 mg total) by mouth daily.   triamcinolone cream (KENALOG) 0.1 % Apply 1 application topically 2 (two) times daily.   No facility-administered encounter medications on file as of 09/17/2022.    Lab Results  Component Value Date   CREATININE 0.55 04/06/2022   BUN 8 04/06/2022   NA 140 04/06/2022   K 4.6 04/06/2022   CL 107 04/06/2022   CO2 26 04/06/2022    BP Readings from Last 3 Encounters:  07/03/22 (!) 151/96  04/06/22 134/82  03/01/22 (!) 147/86    Pulse Readings from Last 3 Encounters:  07/03/22 73  04/06/22 66  03/01/22 77    Current medications: none  Reports not taking amlodipine 10 mg daily - note latest Rx expired 08/2021. Also, patient states she had a cough with this medication as well, but query if referring to lisinopril  Previous therapies tried: lisinopril (cough)  Home Monitoring: Patient does not have an automated upper arm home BP machine; reports has wrist monitor - Denies checking home BP recently  Patient attributes fluctuation in her blood pressure to chronic pain. Note patient follows with Pain Management Specialist Dr. Cherylann Ratel  Admits to adding salt to her food  Caffeine: reports drinks 1 cup of coffee (<8 oz)/day  Assessment/Plan: - Currently uncontrolled - Reviewed appropriate administration of medication regimen - Counseled on long term microvascular and macrovascular complications of uncontrolled hypertension - Encourage patient to consider obtaining an upper  arm blood pressure monitor through her health plan OTC benefit - Reviewed appropriate home BP monitoring technique (avoid caffeine, smoking, and exercise for 30 minutes before checking, rest for at least 5 minutes before taking BP, sit with feet flat on the floor and back against a hard surface, uncross legs, and rest arm on flat surface) - Reviewed to check blood pressure, document, and provide at next provider visit -  Discussed dietary modifications, such as reduced salt intake - Will collaborate with PCP   Follow Up Plan:   1) Patient has Office Visit with PCP on 10/05/2022 2) CM Pharmacist will outreach to patient by telephone again on 11/02/2022 at 10 am  Estelle Grumbles, PharmD, [facility] Clinical Pharmacist [facility] [phone]

## 2022-09-17 NOTE — Patient Instructions (Signed)
Check your blood pressure twice weekly, and any time you have concerning symptoms like headache, chest pain, dizziness, shortness of breath, or vision changes.   Our goal is less than 130/80.  To appropriately check your blood pressure, make sure you do the following:  1) Avoid caffeine, exercise, or tobacco products for 30 minutes before checking. Empty your bladder. 2) Sit with your back supported in a flat-backed chair. Rest your arm on something flat (arm of the chair, table, etc). 3) Sit still with your feet flat on the floor, resting, for at least 5 minutes.  4) Check your blood pressure. Take 1-2 readings.  5) Write down these readings and bring with you to any provider appointments.  Bring your home blood pressure machine with you to a provider's office for accuracy comparison at least once a year.   Make sure you take your blood pressure medications before you come to any office visit, even if you were asked to fast for labs.  Macy Polio Wilmar Prabhakar, PharmD, BCACP Clinical Pharmacist [facility]  [phone]  

## 2022-09-22 ENCOUNTER — Other Ambulatory Visit: Payer: Self-pay | Admitting: Internal Medicine

## 2022-09-22 DIAGNOSIS — M797 Fibromyalgia: Secondary | ICD-10-CM

## 2022-09-22 DIAGNOSIS — F339 Major depressive disorder, recurrent, unspecified: Secondary | ICD-10-CM

## 2022-09-22 DIAGNOSIS — M4714 Other spondylosis with myelopathy, thoracic region: Secondary | ICD-10-CM

## 2022-09-22 DIAGNOSIS — G894 Chronic pain syndrome: Secondary | ICD-10-CM

## 2022-09-24 NOTE — Telephone Encounter (Signed)
Requested Prescriptions  Pending Prescriptions Disp Refills   PARoxetine (PAXIL) 10 MG tablet [Pharmacy Med Name: PARoxetine HCl 10 MG Oral Tablet] 90 tablet 0    Sig: Take 1 tablet by mouth once daily     Psychiatry:  Antidepressants - SSRI Passed - 09/22/2022  2:26 PM      Passed - Valid encounter within last 6 months    Recent Outpatient Visits           1 week ago Primary hypertension   [place] [facility], Gentry Fitz A, RPH-CPP   5 months ago Encounter for general adult medical examination with abnormal findings   [place] [facility] [place], Salvadore Oxford, NP   1 year ago Nipple discharge   [place] [facility] [place], Salvadore Oxford, NP   1 year ago Primary insomnia   [place] [facility] [place], Salvadore Oxford, NP   1 year ago Encounter for general adult medical examination with abnormal findings   [place] [facility] [place], Salvadore Oxford, NP       Future Appointments             In 1 week Sam[place] Si, Salvadore Oxford, NP [place] [facility]             omeprazole (PRILOSEC) 40 MG capsule [place] Med Name: Omeprazole 40 MG Oral Capsule Delayed Release] 180 capsule 1    Sig: Take 1 capsule by mouth twice daily     Gastroenterology: Proton Pump Inhibitors Passed - 09/22/2022  2:26 PM      Passed - Valid encounter within last 12 months    Recent Outpatient Visits           1 week ago Primary hypertension   [place] [facility] [place], Gentry Fitz A, RPH-CPP   5 months ago Encounter for general adult medical examination with abnormal findings   [place] [facility] [place], Salvadore Oxford, NP   1 year ago Nipple discharge   [place] [facility] [place], Salvadore Oxford, NP   1 year ago Primary insomnia   [place] [facility] [place], Salvadore Oxford, NP   1 year ago Encounter for general adult medical examination with abnormal  findings   [place] [facility] [place], Salvadore Oxford, NP       Future Appointments             In 1 week Sampson Si, Salvadore Oxford, NP [place] [facility]             meloxicam (MOBIC) 15 MG tablet [Pharmacy Med Name: Meloxicam 15 MG Oral Tablet] 90 tablet 0    Sig: TAKE 1 TABLET BY MOUTH ONCE DAILY AS NEEDED FOR PAIN     Analgesics:  COX2 Inhibitors Failed - 09/22/2022  2:26 PM      Failed - Manual Review: Labs are only required if the patient has taken medication for more than 8 weeks.      Passed - HGB in normal range and within 360 days    Hemoglobin  Date Value Ref Range Status  04/06/2022 13.8 11.7 - 15.5 g/dL Final         Passed - Cr in normal range and within 360 days    Creat  Date Value Ref Range Status  04/06/2022 0.55 0.50 - 1.05 mg/dL Final  Passed - HCT in normal range and within 360 days    HCT  Date Value Ref Range Status  04/06/2022 39.7 35.0 - 45.0 % Final         Passed - AST in normal range and within 360 days    AST  Date Value Ref Range Status  04/06/2022 14 10 - 35 U/L Final         Passed - ALT in normal range and within 360 days    ALT  Date Value Ref Range Status  04/06/2022 14 6 - 29 U/L Final         Passed - eGFR is 30 or above and within 360 days    GFR, Est African American  Date Value Ref Range Status  03/22/2020 107 > OR = 60 mL/min/1.65m2 Final   GFR, Est Non African American  Date Value Ref Range Status  03/22/2020 92 > OR = 60 mL/min/1.45m2 Final   eGFR  Date Value Ref Range Status  04/06/2022 103 > OR = 60 mL/min/1.67m2 Final         Passed - Patient is not pregnant      Passed - Valid encounter within last 12 months    Recent Outpatient Visits           1 week ago Primary hypertension   [place] [facility] [place], Gentry Fitz A, RPH-CPP   5 months ago Encounter for general adult medical examination with abnormal findings   [place] [facility] [place], Salvadore Oxford, NP   1 year ago Nipple discharge   [place] [facility] [place], Salvadore Oxford, NP   1 year ago Primary insomnia   [place] [facility] [place], Salvadore Oxford, NP   1 year ago Encounter for general adult medical examination with abnormal findings   [place] [facility] [place], Salvadore Oxford, NP       Future Appointments             In 1 week Sampson Si, Salvadore Oxford, NP [place] [facility]

## 2022-09-25 ENCOUNTER — Encounter: Payer: 59 | Admitting: Student in an Organized Health Care Education/Training Program

## 2022-09-27 ENCOUNTER — Ambulatory Visit: Payer: Self-pay | Admitting: *Deleted

## 2022-09-27 NOTE — Patient Outreach (Signed)
  Care Coordination   09/27/2022 Name: DAJAE WHITELOW MRN: [id number] DOB: [birth year]   Care Coordination Outreach Attempts:  An unsuccessful telephone outreach was attempted for a scheduled appointment today.  Follow Up Plan:  Additional outreach attempts will be made to offer the patient care coordination information and services.   Encounter Outcome:  No Answer   Care Coordination Interventions:  No, not indicated    Kemper Durie, RN, MSN, [facility] Care Management Care Management Coordinator [phone]

## 2022-10-05 ENCOUNTER — Encounter: Payer: Self-pay | Admitting: Internal Medicine

## 2022-10-05 ENCOUNTER — Ambulatory Visit (INDEPENDENT_AMBULATORY_CARE_PROVIDER_SITE_OTHER): Payer: 59 | Admitting: Internal Medicine

## 2022-10-05 VITALS — BP 116/68 | HR 73 | Temp 96.4°F | Wt 199.0 lb

## 2022-10-05 DIAGNOSIS — I10 Essential (primary) hypertension: Secondary | ICD-10-CM

## 2022-10-05 DIAGNOSIS — G894 Chronic pain syndrome: Secondary | ICD-10-CM | POA: Diagnosis not present

## 2022-10-05 DIAGNOSIS — R519 Headache, unspecified: Secondary | ICD-10-CM

## 2022-10-05 DIAGNOSIS — K219 Gastro-esophageal reflux disease without esophagitis: Secondary | ICD-10-CM

## 2022-10-05 DIAGNOSIS — M797 Fibromyalgia: Secondary | ICD-10-CM

## 2022-10-05 DIAGNOSIS — I700 Atherosclerosis of aorta: Secondary | ICD-10-CM

## 2022-10-05 DIAGNOSIS — Z6841 Body Mass Index (BMI) 40.0 and over, adult: Secondary | ICD-10-CM

## 2022-10-05 DIAGNOSIS — D1720 Benign lipomatous neoplasm of skin and subcutaneous tissue of unspecified limb: Secondary | ICD-10-CM

## 2022-10-05 DIAGNOSIS — M25551 Pain in right hip: Secondary | ICD-10-CM | POA: Diagnosis not present

## 2022-10-05 DIAGNOSIS — F5101 Primary insomnia: Secondary | ICD-10-CM | POA: Diagnosis not present

## 2022-10-05 DIAGNOSIS — E782 Mixed hyperlipidemia: Secondary | ICD-10-CM

## 2022-10-05 DIAGNOSIS — M8588 Other specified disorders of bone density and structure, other site: Secondary | ICD-10-CM | POA: Diagnosis not present

## 2022-10-05 DIAGNOSIS — R7303 Prediabetes: Secondary | ICD-10-CM

## 2022-10-05 DIAGNOSIS — M47817 Spondylosis without myelopathy or radiculopathy, lumbosacral region: Secondary | ICD-10-CM | POA: Diagnosis not present

## 2022-10-05 DIAGNOSIS — G8929 Other chronic pain: Secondary | ICD-10-CM | POA: Insufficient documentation

## 2022-10-05 DIAGNOSIS — M25552 Pain in left hip: Secondary | ICD-10-CM

## 2022-10-05 MED ORDER — IPRATROPIUM BROMIDE 0.06 % NA SOLN: 2.0000 | Freq: Four times a day (QID) | NASAL | 0 refills | Status: DC

## 2022-10-05 MED ORDER — BELSOMRA 5 MG PO TABS: 1.0000 | ORAL_TABLET | Freq: Every day | ORAL | 0 refills | Status: DC

## 2022-10-05 MED ORDER — ASPIRIN 81 MG PO TBEC: 81.0000 mg | DELAYED_RELEASE_TABLET | Freq: Every day | ORAL | 12 refills | Status: AC

## 2022-10-05 MED ORDER — BUTALBITAL-APAP-CAFFEINE 50-325-40 MG PO TABS: 1.0000 | ORAL_TABLET | Freq: Four times a day (QID) | ORAL | 0 refills | Status: DC | PRN

## 2022-10-05 NOTE — Assessment & Plan Note (Signed)
Continue Fioricet as needed 

## 2022-10-05 NOTE — Assessment & Plan Note (Signed)
Continue Belsomra 

## 2022-10-05 NOTE — Assessment & Plan Note (Signed)
C-Met and lipid profile today Encouraged her to consume a low-fat diet Discussed restarting Repatha injections based on labs

## 2022-10-05 NOTE — Assessment & Plan Note (Signed)
Continue baclofen, meloxicam and pregabalin as prescribed by pain management

## 2022-10-05 NOTE — Assessment & Plan Note (Signed)
Will obtain x-ray of bilateral hips Encouraged her to follow-up with pain management regarding this

## 2022-10-05 NOTE — Assessment & Plan Note (Signed)
Controlled on HCTZ Reinforced DASH diet and exercise for weight loss C-Met today 

## 2022-10-05 NOTE — Assessment & Plan Note (Signed)
A1c today Encourage low-carb diet and exercise for weight loss 

## 2022-10-05 NOTE — Assessment & Plan Note (Signed)
Continue meloxicam, baclofen and pregabalin as prescribed by pain management

## 2022-10-05 NOTE — Progress Notes (Signed)
Subjective:    Patient ID: Kristin Porter, female    DOB: 06-05-1960, 62 y.o.   MRN: [id number]  HPI  Patient presents to clinic today for 37-month follow-up of chronic conditions.  HTN: Her BP today is 116/68.  She is taking HCTZ as prescribed but not Amlodipine.  ECG from 08/2020 reviewed.  Headaches: These occur every other day.  She is not sure what triggers this. She takes Fioricet as needed with some relief of symptoms.  She does not follow with neurology.  GERD: Triggered by hiatal hernia.  She has occasional breakthrough on Omeprazole for which she takes Tums with good relief of symptoms.  Upper GI from 02/2021 reviewed.  Osteopenia: She is not taking any Calcium or Vitamin D OTC.  She does not get weightbearing exercise daily.  Bone density from 09/2019 reviewed.  HLD with Aortic Atherosclerosis: Her last LDL was 152, triglycerides 88, 03/2022.  She is no longer taking Repatha as prescribed.  She is not taking Aspirin.  She tries to consume a low-fat diet.  She follows with cardiology.  Prediabetes: Her last A1c was 5.9%, 03/2022.  She is not taking any oral diabetic medication at this time.  She does not check her sugars.  Depression: Chronic, managed on Paroxetine only as needed.  She is not currently seeing a therapist.  She denies anxiety, SI/HI.  Insomnia: She has difficulty falling and staying asleep.  She is taking Belsomra as prescribed.  There is no sleep study on file.  Chronic Pain/Fibromyalgia: She is having worsening hip pain. Managed with Meloxicam, Baclofen and Pregabalin.  She follows with pain management.  Review of Systems     Past Medical History:  Diagnosis Date   Arthritis    Bilateral hips   Fibromyalgia    Hypertension    Neuropathy     Current Outpatient Medications  Medication Sig Dispense Refill   albuterol (PROVENTIL) (2.5 MG/3ML) 0.083% nebulizer solution Take 3 mLs (2.5 mg total) by nebulization every 6 (six) hours as needed for  wheezing or shortness of breath. 75 mL 0   amLODipine (NORVASC) 10 MG tablet Take 1 tablet (10 mg total) by mouth daily. (Patient not taking: Reported on 09/17/2022) 90 tablet 1   baclofen (LIORESAL) 10 MG tablet Take 1 tablet (10 mg total) by mouth 4 (four) times daily as needed for muscle spasms. 120 each 5   Blood Pressure Monitoring (BLOOD PRESSURE KIT) DEVI 1 Units by Does not apply route in the morning and at bedtime. 1 each 0   butalbital-acetaminophen-caffeine (FIORICET) 50-325-40 MG tablet Take 1 tablet by mouth every 6 (six) hours as needed for headache. 14 tablet 0   fluticasone (FLONASE) 50 MCG/ACT nasal spray Place 1 spray into both nostrils 2 (two) times daily. 9.9 mL 1   meloxicam (MOBIC) 15 MG tablet Take 1 tablet (15 mg total) by mouth daily as needed for pain. 90 tablet 1   omeprazole (PRILOSEC) 40 MG capsule Take 1 capsule by mouth twice daily 180 capsule 1   PARoxetine (PAXIL) 10 MG tablet Take 1 tablet (10 mg total) by mouth daily. 90 tablet 1   pregabalin (LYRICA) 100 MG capsule Take 1-2 capsules (100-200 mg total) by mouth 2 (two) times daily. 60 capsule 5   Suvorexant (BELSOMRA) 5 MG TABS Take 1 tablet by mouth at bedtime. 30 tablet 0   triamcinolone cream (KENALOG) 0.1 % Apply 1 application topically 2 (two) times daily. 30 g 0   No current facility-administered  medications for this visit.    Allergies  Allergen Reactions   Tizanidine Other (See Comments)    Throat closing and trouble breathing    Lisinopril Cough   Tramadol Itching    Can take with Benadryl    Family History  Family history unknown: Yes    Social History   Socioeconomic History   Marital status: Divorced    Spouse name: Not on file   Number of children: Not on file   Years of education: Not on file   Highest education level: Not on file  Occupational History   Occupation: disability   Tobacco Use   Smoking status: Former    Types: Cigarettes    Quit date: 1970    Years since  quitting: 54.4   Smokeless tobacco: Never  Vaping Use   Vaping Use: Never used  Substance and Sexual Activity   Alcohol use: Not Currently   Drug use: Never   Sexual activity: Not Currently  Other Topics Concern   Not on file  Social History Narrative   Not on file   Social Determinants of Health   Financial Resource Strain: Low Risk  (10/04/2022)   Overall Financial Resource Strain (CARDIA)    Difficulty of Paying Living Expenses: Not hard at all  Food Insecurity: No Food Insecurity (10/04/2022)   Hunger Vital Sign    Worried About Running Out of Food in the Last Year: Never true    Ran Out of Food in the Last Year: Never true  Transportation Needs: No Transportation Needs (10/04/2022)   PRAPARE - Administrator, Civil Service (Medical): No    Lack of Transportation (Non-Medical): No  Physical Activity: Unknown (10/04/2022)   Exercise Vital Sign    Days of Exercise per Week: 1 day    Minutes of Exercise per Session: Patient declined  Stress: No Stress Concern Present (10/04/2022)   [employer] of Occupational Health - Occupational Stress Questionnaire    Feeling of Stress : Not at all  Social Connections: Moderately Isolated (10/04/2022)   Social Connection and Isolation Panel [NHANES]    Frequency of Communication with Friends and Family: More than three times a week    Frequency of Social Gatherings with Friends and Family: Twice a week    Attends Religious Services: Never    Database administrator or Organizations: Yes    Attends Banker Meetings: Never    Marital Status: Divorced  Catering manager Violence: Not At Risk (10/20/2021)   Humiliation, Afraid, Rape, and Kick questionnaire    Fear of Current or Ex-Partner: No    Emotionally Abused: No    Physically Abused: No    Sexually Abused: No     Constitutional: Patient reports intermittent headaches.  Denies fever, malaise, fatigue, or abrupt weight changes.  HEENT: Denies eye pain, eye  redness, ear pain, ringing in the ears, wax buildup, runny nose, nasal congestion, bloody nose, or sore throat. Respiratory: Denies difficulty breathing, shortness of breath, cough or sputum production.   Cardiovascular: Denies chest pain, chest tightness, palpitations or swelling in the hands or feet.  Gastrointestinal: Denies abdominal pain, bloating, constipation, diarrhea or blood in the stool.  GU: Denies urgency, frequency, pain with urination, burning sensation, blood in urine, odor or discharge. Musculoskeletal: Patient reports chronic joint and muscle pain.  Denies decrease in range of motion, difficulty with gait, or joint swelling.  Skin: Patient reports lipoma right ankle.  Denies redness, rashes, lesions or ulcercations.  Neurological: Patient reports insomnia.  Denies dizziness, difficulty with memory, difficulty with speech or problems with balance and coordination.  Psych: Patient has a history of depression.  Denies anxiety, SI/HI.  No other specific complaints in a complete review of systems (except as listed in HPI above).  Objective:   Physical Exam   BP 116/68 (BP Location: Right Arm, Patient Position: Sitting, Cuff Size: Large)   Pulse 73   Temp (!) 96.4 F (35.8 C) (Temporal)   Wt 199 lb (90.3 kg)   SpO2 97%   BMI 40.19 kg/m   Wt Readings from Last 3 Encounters:  07/03/22 193 lb (87.5 kg)  04/06/22 199 lb (90.3 kg)  03/01/22 198 lb (89.8 kg)    General: Appears her stated age, obese in NAD. Skin: Warm, dry and intact.  Lipoma noted of bilateral ankles, R >L. HEENT: Head: normal shape and size; Eyes: sclera white, no icterus, conjunctiva pink, PERRLA and EOMs intact;  Cardiovascular: Normal rate and rhythm. S1,S2 noted.  No murmur, rubs or gallops noted. No JVD or BLE edema. No carotid bruits noted. Pulmonary/Chest: Normal effort and positive vesicular breath sounds. No respiratory distress. No wheezes, rales or ronchi noted.  Abdomen: Soft and nontender.  Normal bowel sounds.  Musculoskeletal: Gait slow and steady with use of cane. Neurological: Alert and oriented. Coordination normal.  Psychiatric: Mood and affect normal. Behavior is normal. Judgment and thought content normal.     BMET    Component Value Date/Time   NA 140 04/06/2022 1338   K 4.6 04/06/2022 1338   CL 107 04/06/2022 1338   CO2 26 04/06/2022 1338   GLUCOSE 93 04/06/2022 1338   BUN 8 04/06/2022 1338   CREATININE 0.55 04/06/2022 1338   CALCIUM 9.1 04/06/2022 1338   GFRNONAA 92 03/22/2020 0911   GFRAA 107 03/22/2020 0911    Lipid Panel     Component Value Date/Time   CHOL 218 (H) 04/06/2022 1338   TRIG 88 04/06/2022 1338   HDL 47 (L) 04/06/2022 1338   CHOLHDL 4.6 04/06/2022 1338   VLDL 17 09/08/2020 0914   LDLCALC 152 (H) 04/06/2022 1338    CBC    Component Value Date/Time   WBC 5.5 04/06/2022 1338   RBC 4.70 04/06/2022 1338   HGB 13.8 04/06/2022 1338   HCT 39.7 04/06/2022 1338   PLT 301 04/06/2022 1338   MCV 84.5 04/06/2022 1338   MCH 29.4 04/06/2022 1338   MCHC 34.8 04/06/2022 1338   RDW 13.0 04/06/2022 1338   LYMPHSABS 2,051 03/22/2020 0911   MONOABS 0.8 12/23/2018 1153   EOSABS 73 03/22/2020 0911   BASOSABS 37 03/22/2020 0911    Hgb A1C Lab Results  Component Value Date   HGBA1C 5.9 (H) 04/06/2022           Assessment & Plan:   Lipoma of Ankle:  Advised her that the only treatment for these is surgical excision however they do come back in about 20% of cases Referral to general surgery for further evaluation and treatment   RTC in 6 months for annual exam Nicki Reaper, NP

## 2022-10-05 NOTE — Patient Instructions (Signed)

## 2022-10-05 NOTE — Assessment & Plan Note (Signed)
Encouraged diet and exercise for weight loss ?

## 2022-10-05 NOTE — Assessment & Plan Note (Signed)
Continue meloxicam, baclofen and pregabalin as prescribed by pain management 

## 2022-10-05 NOTE — Assessment & Plan Note (Signed)
C-Met and lipid profile today Discussed possibly restarting Repatha injections based on labs Will have her start baby aspirin OTC Reinforced low-fat diet

## 2022-10-05 NOTE — Assessment & Plan Note (Signed)
Continue omeprazole and Tums as needed

## 2022-10-05 NOTE — Assessment & Plan Note (Signed)
Encouraged her to start taking calcium and vitamin D OTC Encouraged daily weightbearing exercise

## 2022-10-06 LAB — COMPLETE METABOLIC PANEL WITH GFR
AG Ratio: 1.4 (calc) (ref 1.0–2.5)
ALT: 21 U/L (ref 6–29)
AST: 16 U/L (ref 10–35)
Albumin: 4.2 g/dL (ref 3.6–5.1)
Alkaline phosphatase (APISO): 72 U/L (ref 37–153)
BUN: 13 mg/dL (ref 7–25)
CO2: 26 mmol/L (ref 20–32)
Calcium: 9 mg/dL (ref 8.6–10.4)
Chloride: 106 mmol/L (ref 98–110)
Creat: 0.64 mg/dL (ref 0.50–1.05)
Globulin: 2.9 g/dL (calc) (ref 1.9–3.7)
Glucose, Bld: 97 mg/dL (ref 65–99)
Potassium: 4.4 mmol/L (ref 3.5–5.3)
Sodium: 141 mmol/L (ref 135–146)
Total Bilirubin: 0.4 mg/dL (ref 0.2–1.2)
Total Protein: 7.1 g/dL (ref 6.1–8.1)
eGFR: 99 mL/min/{1.73_m2} (ref >=60)

## 2022-10-06 LAB — LIPID PANEL
Cholesterol: 199 mg/dL (ref <200)
HDL: 48 mg/dL — ABNORMAL LOW (ref >=50)
LDL Cholesterol (Calc): 133 mg/dL (calc) — ABNORMAL HIGH
Non-HDL Cholesterol (Calc): 151 mg/dL (calc) — ABNORMAL HIGH (ref <130)
Total CHOL/HDL Ratio: 4.1 (calc) (ref <5.0)
Triglycerides: 81 mg/dL (ref <150)

## 2022-10-06 LAB — CBC
HCT: 41.8 % (ref 35.0–45.0)
Hemoglobin: 14 g/dL (ref 11.7–15.5)
MCH: 29.4 pg (ref 27.0–33.0)
MCHC: 33.5 g/dL (ref 32.0–36.0)
MCV: 87.6 fL (ref 80.0–100.0)
MPV: 10.3 fL (ref 7.5–12.5)
Platelets: 303 10*3/uL (ref 140–400)
RBC: 4.77 10*6/uL (ref 3.80–5.10)
RDW: 13 % (ref 11.0–15.0)
WBC: 5.7 10*3/uL (ref 3.8–10.8)

## 2022-10-06 LAB — HEMOGLOBIN A1C
Hgb A1c MFr Bld: 6 % of total Hgb — ABNORMAL HIGH (ref <5.7)
Mean Plasma Glucose: 126 mg/dL
eAG (mmol/L): 7 mmol/L

## 2022-10-09 ENCOUNTER — Ambulatory Visit
Admission: RE | Admit: 2022-10-09 | Discharge: 2022-10-09 | Disposition: A | Discharge status: Home / Self Care | Payer: 59 | Attending: Internal Medicine | Admitting: Internal Medicine

## 2022-10-09 ENCOUNTER — Ambulatory Visit
Admission: RE | Admit: 2022-10-09 | Discharge: 2022-10-09 | Disposition: A | Discharge status: Home / Self Care | Payer: 59 | Source: Ambulatory Visit | Attending: Internal Medicine | Admitting: Internal Medicine

## 2022-10-09 DIAGNOSIS — G8929 Other chronic pain: Secondary | ICD-10-CM | POA: Insufficient documentation

## 2022-10-09 DIAGNOSIS — M160 Bilateral primary osteoarthritis of hip: Secondary | ICD-10-CM | POA: Diagnosis not present

## 2022-10-09 DIAGNOSIS — M25551 Pain in right hip: Secondary | ICD-10-CM | POA: Diagnosis not present

## 2022-10-09 DIAGNOSIS — M25552 Pain in left hip: Secondary | ICD-10-CM | POA: Insufficient documentation

## 2022-10-16 ENCOUNTER — Encounter: Payer: Self-pay | Admitting: Student in an Organized Health Care Education/Training Program

## 2022-10-16 ENCOUNTER — Ambulatory Visit
Payer: 59 | Attending: Student in an Organized Health Care Education/Training Program | Admitting: Student in an Organized Health Care Education/Training Program

## 2022-10-16 VITALS — BP 157/85 | HR 77 | Temp 98.1°F | Resp 18 | Ht 59.0 in | Wt 198.0 lb

## 2022-10-16 DIAGNOSIS — G894 Chronic pain syndrome: Secondary | ICD-10-CM | POA: Diagnosis not present

## 2022-10-16 DIAGNOSIS — G8929 Other chronic pain: Secondary | ICD-10-CM | POA: Insufficient documentation

## 2022-10-16 DIAGNOSIS — M47816 Spondylosis without myelopathy or radiculopathy, lumbar region: Secondary | ICD-10-CM | POA: Diagnosis not present

## 2022-10-16 DIAGNOSIS — M545 Low back pain, unspecified: Secondary | ICD-10-CM | POA: Insufficient documentation

## 2022-10-16 DIAGNOSIS — M47817 Spondylosis without myelopathy or radiculopathy, lumbosacral region: Secondary | ICD-10-CM | POA: Insufficient documentation

## 2022-10-16 DIAGNOSIS — M4714 Other spondylosis with myelopathy, thoracic region: Secondary | ICD-10-CM | POA: Diagnosis not present

## 2022-10-16 DIAGNOSIS — M7918 Myalgia, other site: Secondary | ICD-10-CM | POA: Insufficient documentation

## 2022-10-16 DIAGNOSIS — M797 Fibromyalgia: Secondary | ICD-10-CM | POA: Insufficient documentation

## 2022-10-16 MED ORDER — TRAMADOL HCL ER 100 MG PO TB24: 100.0000 mg | ORAL_TABLET | Freq: Every day | ORAL | 2 refills | Status: AC

## 2022-10-16 MED ORDER — TRAMADOL HCL 50 MG PO TABS: 50.0000 mg | ORAL_TABLET | Freq: Every day | ORAL | 2 refills | Status: DC | PRN

## 2022-10-16 MED ORDER — MELOXICAM 15 MG PO TABS
15.0000 mg | ORAL_TABLET | Freq: Every day | ORAL | 1 refills | Status: DC | PRN
Start: 2022-10-16 — End: 2023-05-21

## 2022-10-16 MED ORDER — PREGABALIN 100 MG PO CAPS
100.0000 mg | ORAL_CAPSULE | Freq: Two times a day (BID) | ORAL | 5 refills | Status: DC
Start: 2022-10-16 — End: 2023-05-21

## 2022-10-16 NOTE — Progress Notes (Signed)
Nursing Pain Medication Assessment:  Safety precautions to be maintained throughout the outpatient stay will include: orient to surroundings, keep bed in low position, maintain call bell within reach at all times, provide assistance with transfer out of bed and ambulation.  Medication Inspection Compliance: Kristin Porter did not comply with our request to bring her pills to be counted. She was reminded that bringing the medication bottles, even when empty, is a requirement.  Medication: Tramadol (Ultram) Pill/Patch Count: No pills available to be counted. Pill/Patch Appearance: No markings Bottle Appearance: No container available. Did not bring bottle(s) to appointment. Last Medication intake:   3 days ago. Takes PRN

## 2022-10-16 NOTE — Progress Notes (Signed)
PROVIDER NOTE: Information contained herein reflects review and annotations entered in association with encounter. Interpretation of such information and data should be left to medically-trained personnel. Information provided to patient can be located elsewhere in the medical record under "Patient Instructions". Document created using STT-dictation technology, any transcriptional errors that may result from process are unintentional.    Patient: Kristin Porter  Service Category: E/M  Provider: Edward Jolly, MD  DOB: Mar 12, 1961  DOS: 10/16/2022  Specialty: Interventional Pain Management  MRN: [id number]  Setting: Ambulatory outpatient  PCP: Lorre Munroe, NP  Type: Established Patient    Referring Provider: Lorre Munroe, NP  Location: Office  Delivery: Face-to-face     HPI  Kristin Porter, a 62 y.o. year old female, is here today because of her Lumbosacral spondylosis without myelopathy [M47.817]. Kristin Porter primary complain today is Fibromyalgia Last encounter: My last encounter with her was on 07/03/22 Pertinent problems: Kristin Porter has Fibromyalgia; Lumbosacral spondylosis without myelopathy; and Chronic pain syndrome on their pertinent problem list. Pain Assessment: Severity of Chronic pain is reported as a 7 /10. Location: Other (Comment) ("all over")  / . Onset: More than a month ago. Quality: Aching, Burning, Stabbing. Timing: Constant. Modifying factor(s): Tramadol, rest, heat ice. Vitals:  height is 4\' 11"  (1.499 m) and weight is 198 lb (89.8 kg). Her temporal temperature is 98.1 F (36.7 C). Her pulse is 77. Her respiration is 18 and oxygen saturation is 98%.   Reason for encounter:   Diffuse myalgias and arthralgias related to fibromyalgia States that Tramadol ER is doing a much better job at managing her pain, better than Belbuca and Tramadol IR Continue Lyrica and Baclofen as prescribed, will refill Lyrica as below Also finds benefit with Mobic 15 mg daily  prn  Pharmacotherapy Assessment  Analgesic: Tramadol 100 mg ER daily, Tramadol 50 mg daily prn breakthrough pain  Monitoring: [place] PMP: PDMP not reviewed this encounter.       Pharmacotherapy: No side-effects or adverse reactions reported. Compliance: No problems identified. Effectiveness: Clinically acceptable.  [name], RN  10/16/2022  8:50 AM  Sign when Signing Visit Nursing Pain Medication Assessment:  Safety precautions to be maintained throughout the outpatient stay will include: orient to surroundings, keep bed in low position, maintain call bell within reach at all times, provide assistance with transfer out of bed and ambulation.  Medication Inspection Compliance: Kristin Porter did not comply with our request to bring her pills to be counted. She was reminded that bringing the medication bottles, even when empty, is a requirement.  Medication: Tramadol (Ultram) Pill/Patch Count: No pills available to be counted. Pill/Patch Appearance: No markings Bottle Appearance: No container available. Did not bring bottle(s) to appointment. Last Medication intake:   3 days ago. Takes PRN     UDS:  Summary  Date Value Ref Range Status  10/13/2020 Note  Final    Comment:    ==================================================================== [employer] [facility] 13 (MW) ==================================================================== Test                             Result       Flag       Units  Drug Present and Declared for Prescription Verification   Hydrocodone                    697          EXPECTED   ng/mg creat  Hydromorphone                  365          EXPECTED   ng/mg creat   Dihydrocodeine                 240          EXPECTED   ng/mg creat   Norhydrocodone                 620          EXPECTED   ng/mg creat    Sources of hydrocodone include scheduled prescription medications.    Hydromorphone, dihydrocodeine and norhydrocodone are expected    metabolites of  hydrocodone. Hydromorphone and dihydrocodeine are    also available as scheduled prescription medications.  Drug Present not Declared for Prescription Verification   Oxazepam                       21           UNEXPECTED ng/mg creat   Temazepam                      15           UNEXPECTED ng/mg creat    Oxazepam and temazepam are expected metabolites of diazepam.    Oxazepam is also an expected metabolite of other benzodiazepine    drugs, including chlordiazepoxide, prazepam, clorazepate, halazepam,    and temazepam.  Oxazepam and temazepam are available as scheduled    prescription medications.  ==================================================================== Test                      Result    Flag   Units      Ref Range   Creatinine              149              mg/dL      >=16 ==================================================================== Declared Medications:  The flagging and interpretation on this report are based on the  following declared medications.  Unexpected results may arise from  inaccuracies in the declared medications.   **Note: The testing scope of this panel includes these medications:   Hydrocodone   **Note: The testing scope of this panel does not include the  following reported medications:   Acetaminophen  Albuterol  Amlodipine  Baclofen  Evolocumab (Repatha)  Fluticasone  Hydrochlorothiazide  Meloxicam  Omeprazole  Paroxetine  Pregabalin ==================================================================== For clinical consultation, please call [phone]. ====================================================================      ROS  Constitutional: Denies any fever or chills Gastrointestinal: No reported hemesis, hematochezia, vomiting, or acute GI distress Musculoskeletal:  Low back pain, worse with facet loading and lumbar extension Neurological: No reported episodes of acute onset apraxia, aphasia, dysarthria, agnosia,  amnesia, paralysis, loss of coordination, or loss of consciousness  Medication Review  Blood Pressure Kit, PARoxetine, Suvorexant, albuterol, aspirin EC, baclofen, butalbital-acetaminophen-caffeine, fluticasone, ipratropium, meloxicam, omeprazole, pregabalin, traMADol, and triamcinolone cream  History Review  Allergy: Kristin Porter is allergic to tizanidine, lisinopril, and tramadol. Drug: Kristin Porter  reports no history of drug use. Alcohol:  reports that she does not currently use alcohol. Tobacco:  reports that she quit smoking about 54 years ago. Her smoking use included cigarettes. She has never used smokeless tobacco. Social: Kristin Porter  reports that she quit smoking about 54 years ago. Her smoking use included cigarettes.  She has never used smokeless tobacco. She reports that she does not currently use alcohol. She reports that she does not use drugs. Medical:  has a past medical history of Arthritis, Fibromyalgia, Hypertension, and Neuropathy. Surgical: Kristin Porter  has a past surgical history that includes Breast biopsy (Right, 2018); Esophagogastroduodenoscopy (egd) with propofol (N/A, 02/22/2021); and Breast biopsy (Left, 07/12/2021). Family: Family history is unknown by patient.  Laboratory Chemistry Profile   Renal Lab Results  Component Value Date   BUN 13 10/05/2022   CREATININE 0.64 10/05/2022   BCR SEE NOTE: 10/05/2022   GFRAA 107 03/22/2020   GFRNONAA 92 03/22/2020    Hepatic Lab Results  Component Value Date   AST 16 10/05/2022   ALT 21 10/05/2022    Electrolytes Lab Results  Component Value Date   NA 141 10/05/2022   K 4.4 10/05/2022   CL 106 10/05/2022   CALCIUM 9.0 10/05/2022    Bone No results found for: "VD25OH", "VD125OH2TOT", "UJ8119JY7", "WG9562ZH0", "25OHVITD1", "25OHVITD2", "25OHVITD3", "TESTOFREE", "TESTOSTERONE"  Inflammation (CRP: Acute Phase) (ESR: Chronic Phase) Lab Results  Component Value Date   CRP 15.2 (H) 12/12/2020   ESRSEDRATE 34 (H)  12/12/2020         Note: Above Lab results reviewed.  Recent Imaging Review  DG HIPS BILAT WITH PELVIS MIN 5 VIEWS CLINICAL DATA:  Pain.  EXAM: DG HIP (WITH OR WITHOUT PELVIS) 5V BILAT  COMPARISON:  03/09/2018  FINDINGS: There is no evidence of hip fracture or dislocation. Pelvic ring intact. No osteolytic or osteoblastic lesions. Bilateral hip osteoarthritis noted with joint space narrowing small osteophytes.  IMPRESSION: Bilateral hip degenerative changes.  Electronically Signed   By: Layla Maw M.D.   On: 10/13/2022 09:45 Note: Reviewed        Physical Exam  General appearance: Well nourished, well developed, and well hydrated. In no apparent acute distress Mental status: Alert, oriented x 3 (person, place, & time)       Respiratory: No evidence of acute respiratory distress Eyes: PERLA Vitals: Pulse 77   Temp 98.1 F (36.7 C) (Temporal)   Resp 18   Ht 4\' 11"  (1.499 m)   Wt 198 lb (89.8 kg)   SpO2 98%   BMI 39.99 kg/m  BMI: Estimated body mass index is 39.99 kg/m as calculated from the following:   Height as of this encounter: 4\' 11"  (1.499 m).   Weight as of this encounter: 198 lb (89.8 kg). Ideal: Patient must be at least 60 in tall to calculate ideal body weight  Lumbar Spine Area Exam  Skin & Axial Inspection: No masses, redness, or swelling Alignment: Symmetrical Functional ROM: Pain restricted ROM affecting both sides Stability: No instability detected Muscle Tone/Strength: Functionally intact. No obvious neuro-muscular anomalies detected. Sensory (Neurological): Musculoskeletal pain pattern Palpation: No palpable anomalies       Provocative Tests: Hyperextension/rotation test: (+) bilaterally for facet joint pain.  Gait & Posture Assessment  Ambulation: Unassisted Gait: Relatively normal for age and body habitus Posture: WNL   Lower Extremity Exam    Side: Right lower extremity  Side: Left lower extremity  Stability: No instability  observed          Stability: No instability observed          Skin & Extremity Inspection: Skin color, temperature, and hair growth are WNL. No peripheral edema or cyanosis. No masses, redness, swelling, asymmetry, or associated skin lesions. No contractures.  Skin & Extremity Inspection: Skin color, temperature, and hair growth  are WNL. No peripheral edema or cyanosis. No masses, redness, swelling, asymmetry, or associated skin lesions. No contractures.  Functional ROM: Pain restricted ROM for hip joint          Functional ROM: Pain restricted ROM for hip joint          Muscle Tone/Strength: Functionally intact. No obvious neuro-muscular anomalies detected.  Muscle Tone/Strength: Functionally intact. No obvious neuro-muscular anomalies detected.  Sensory (Neurological): Musculoskeletal pain pattern        Sensory (Neurological): Musculoskeletal pain pattern        DTR: Patellar: deferred today Achilles: deferred today Plantar: deferred today  DTR: Patellar: deferred today Achilles: deferred today Plantar: deferred today  Palpation: No palpable anomalies  Palpation: No palpable anomalies    Assessment   Status Diagnosis  Controlled Controlled Controlled 1. Lumbosacral spondylosis without myelopathy   2. Lumbar facet joint syndrome   3. Chronic bilateral low back pain without sciatica   4. Fibromyalgia   5. Chronic pain syndrome   6. Myofascial pain syndrome of lumbar spine   7. Thoracic myelopathy (due to syrinx, s/p excision 12/23/2017)         Plan of Care    Kristin Porter has a current medication list which includes the following long-term medication(s): albuterol, fluticasone, ipratropium, omeprazole, paroxetine, and pregabalin.   Pharmacotherapy (Medications Ordered): Meds ordered this encounter  Medications   traMADol (ULTRAM-ER) 100 MG 24 hr tablet    Sig: Take 1 tablet (100 mg total) by mouth daily.    Dispense:  30 tablet    Refill:  2   traMADol  (ULTRAM) 50 MG tablet    Sig: Take 1 tablet (50 mg total) by mouth daily as needed.    Dispense:  30 tablet    Refill:  2   pregabalin (LYRICA) 100 MG capsule    Sig: Take 1-2 capsules (100-200 mg total) by mouth 2 (two) times daily.    Dispense:  60 capsule    Refill:  5    Fill one day early if pharmacy is closed on scheduled refill date. May substitute for generic if available.   meloxicam (MOBIC) 15 MG tablet    Sig: Take 1 tablet (15 mg total) by mouth daily as needed for pain.    Dispense:  90 tablet    Refill:  1  Continue Baclofen as prescribed  Follow-up plan:   Return for patient will call to schedule F2F appt prn.    Recent Visits No visits were found meeting these conditions. Showing recent visits within past 90 days and meeting all other requirements Today's Visits Date Type Provider Dept  10/16/22 Office Visit Edward Jolly, MD [facility]  Showing today's visits and meeting all other requirements Future Appointments No visits were found meeting these conditions. Showing future appointments within next 90 days and meeting all other requirements  I discussed the assessment and treatment plan with the patient. The patient was provided an opportunity to ask questions and all were answered. The patient agreed with the plan and demonstrated an understanding of the instructions.  Patient advised to call back or seek an in-person evaluation if the symptoms or condition worsens.  Duration of encounter: .  Note by: Edward Jolly, MD Date: 10/16/2022; Time: 9:20 AM

## 2022-10-18 ENCOUNTER — Ambulatory Visit (INDEPENDENT_AMBULATORY_CARE_PROVIDER_SITE_OTHER): Payer: 59 | Admitting: Surgery

## 2022-10-18 ENCOUNTER — Encounter: Payer: Self-pay | Admitting: Surgery

## 2022-10-18 VITALS — BP 136/88 | HR 68 | Temp 98.5°F | Ht 59.0 in | Wt 197.0 lb

## 2022-10-18 DIAGNOSIS — M25571 Pain in right ankle and joints of right foot: Secondary | ICD-10-CM

## 2022-10-18 DIAGNOSIS — M25471 Effusion, right ankle: Secondary | ICD-10-CM

## 2022-10-18 DIAGNOSIS — D1779 Benign lipomatous neoplasm of other sites: Secondary | ICD-10-CM

## 2022-10-18 NOTE — Patient Instructions (Signed)
A referral has been sent to Ortho. They will call you with an appointment.   If you have any concerns or questions, please feel free to call our office.   Lipoma  A lipoma is a noncancerous (benign) tumor that is made up of fat cells. This is a very common type of soft-tissue growth. Lipomas are usually found under the skin (subcutaneous). They may occur in any tissue of the body that contains fat. Common areas for lipomas to appear include the back, arms, shoulders, buttocks, and thighs. Lipomas grow slowly, and they are usually painless. Most lipomas do not cause problems and do not require treatment. What are the causes? The cause of this condition is not known. What increases the risk? You are more likely to develop this condition if: You are 23-24 years old. You have a family history of lipomas. What are the signs or symptoms? A lipoma usually appears as a small, round bump under the skin. In most cases, the lump will: Feel soft or rubbery. Not cause pain or other symptoms. However, if a lipoma is located in an area where it pushes on nerves, it can become painful or cause other symptoms. How is this diagnosed? A lipoma can usually be diagnosed with a physical exam. You may also have tests to confirm the diagnosis and to rule out other conditions. Tests may include: Imaging tests, such as a CT scan or an MRI. Removal of a tissue sample to be looked at under a microscope (biopsy). How is this treated? Treatment for this condition depends on the size of the lipoma and whether it is causing any symptoms. For small lipomas that are not causing problems, no treatment is needed. If a lipoma is bigger or it causes problems, surgery may be done to remove the lipoma. Lipomas can also be removed to improve appearance. Most often, the procedure is done after applying a medicine that numbs the area (local anesthetic). Liposuction may be done to reduce the size of the lipoma before it is removed  through surgery, or it may be done to remove the lipoma. Lipomas are removed with this method to limit incision size and scarring. A liposuction tube is inserted through a small incision into the lipoma, and the contents of the lipoma are removed through the tube with suction. Follow these instructions at home: Watch your lipoma for any changes. Keep all follow-up visits. This is important. Where to find more information [facility]: orthoinfo.[facility].org Contact a health care provider if: Your lipoma becomes larger or hard. Your lipoma becomes painful, red, or increasingly swollen. These could be signs of infection or a more serious condition. Get help right away if: You develop tingling or numbness in an area near the lipoma. This could indicate that the lipoma is causing nerve damage. Summary A lipoma is a noncancerous tumor that is made up of fat cells. Most lipomas do not cause problems and do not require treatment. If a lipoma is bigger or it causes problems, surgery may be done to remove the lipoma. Contact a health care provider if your lipoma becomes larger or hard, or if it becomes painful, red, or increasingly swollen. These could be signs of infection or a more serious condition. This information is not intended to replace advice given to you by your health care provider. Make sure you discuss any questions you have with your health care provider. Document Revised: 05/26/2021 Document Reviewed: 05/26/2021 Elsevier Patient Education  2024 [employer].

## 2022-10-18 NOTE — Progress Notes (Signed)
Patient ID: Kristin Porter, female   DOB: 02/05/1961, 62 y.o.   MRN: [id number]  Chief Complaint: Concern regarding symptomatic lipomas of the ankles.  History of Present Illness Kristin Porter is a 62 y.o. female with history of 8 to 10 years of some pain in the right outer ankle.  There is some associated fullness in the area consistent with swelling but not consistent with any lipomatous disease process.  Over the years there has been some increase in the size of these, but clearly nothing masslike or worthy of excision on exam.  Past Medical History Past Medical History:  Diagnosis Date   Arthritis    Bilateral hips   Fibromyalgia    Hypertension    Neuropathy       Past Surgical History:  Procedure Laterality Date   BREAST BIOPSY Right 2018   benign, clip was placed   BREAST BIOPSY Left 07/12/2021   [place] bx , heart marker, path pending   CESAREAN SECTION     three total   ESOPHAGOGASTRODUODENOSCOPY (EGD) WITH PROPOFOL N/A 02/22/2021   Procedure: ESOPHAGOGASTRODUODENOSCOPY (EGD) WITH PROPOFOL;  Surgeon: Toney Reil, MD;  Location: [facility] ENDOSCOPY;  Service: Gastroenterology;  Laterality: N/A;   REPLACEMENT TOTAL KNEE Left     Allergies  Allergen Reactions   Tizanidine Other (See Comments)    Throat closing and trouble breathing    Lisinopril Cough   Tramadol Itching    Can take with Benadryl    Current Outpatient Medications  Medication Sig Dispense Refill   albuterol (PROVENTIL) (2.5 MG/3ML) 0.083% nebulizer solution Take 3 mLs (2.5 mg total) by nebulization every 6 (six) hours as needed for wheezing or shortness of breath. 75 mL 0   aspirin EC 81 MG tablet Take 1 tablet (81 mg total) by mouth daily. Swallow whole. 30 tablet 12   baclofen (LIORESAL) 10 MG tablet Take 1 tablet (10 mg total) by mouth 4 (four) times daily as needed for muscle spasms. 120 each 5   Blood Pressure Monitoring (BLOOD PRESSURE KIT) DEVI 1 Units by Does not apply route in the  morning and at bedtime. 1 each 0   butalbital-acetaminophen-caffeine (FIORICET) 50-325-40 MG tablet Take 1 tablet by mouth every 6 (six) hours as needed for headache. 14 tablet 0   fluticasone (FLONASE) 50 MCG/ACT nasal spray Place 1 spray into both nostrils 2 (two) times daily. 9.9 mL 1   ipratropium (ATROVENT) 0.06 % nasal spray Place 2 sprays into both nostrils 4 (four) times daily. For up to 5-7 days then stop. 15 mL 0   meloxicam (MOBIC) 15 MG tablet Take 1 tablet (15 mg total) by mouth daily as needed for pain. 90 tablet 1   omeprazole (PRILOSEC) 40 MG capsule Take 1 capsule by mouth twice daily 180 capsule 1   PARoxetine (PAXIL) 10 MG tablet Take 1 tablet (10 mg total) by mouth daily. 90 tablet 1   pregabalin (LYRICA) 100 MG capsule Take 1-2 capsules (100-200 mg total) by mouth 2 (two) times daily. 60 capsule 5   Suvorexant (BELSOMRA) 5 MG TABS Take 1 tablet (5 mg total) by mouth at bedtime. 30 tablet 0   [START ON 11/22/2022] traMADol (ULTRAM) 50 MG tablet Take 1 tablet (50 mg total) by mouth daily as needed. 30 tablet 2   [START ON 11/22/2022] traMADol (ULTRAM-ER) 100 MG 24 hr tablet Take 1 tablet (100 mg total) by mouth daily. 30 tablet 2   triamcinolone cream (KENALOG) 0.1 % Apply 1 application  topically 2 (two) times daily. 30 g 0   No current facility-administered medications for this visit.    Family History Family History  Family history unknown: Yes      Social History Social History   Tobacco Use   Smoking status: Former    Types: Cigarettes    Quit date: 1970    Years since quitting: 54.4    Passive exposure: Past   Smokeless tobacco: Never  Vaping Use   Vaping Use: Never used  Substance Use Topics   Alcohol use: Not Currently   Drug use: Never        Review of Systems  Constitutional:  Positive for malaise/fatigue.  HENT:  Positive for tinnitus.   Eyes:  Positive for pain.  Respiratory: Negative.    Cardiovascular: Negative.   Gastrointestinal:  Positive  for constipation and heartburn.  Genitourinary:  Positive for urgency.  Skin: Negative.   Neurological:  Positive for tingling and headaches.  Psychiatric/Behavioral: Negative.       Physical Exam Blood pressure 136/88, pulse 68, temperature 98.5 F (36.9 C), height 4\' 11"  (1.499 m), weight 197 lb (89.4 kg), SpO2 97 %. Last Weight  Most recent update: 10/18/2022 10:52 AM    Weight  89.4 kg (197 lb)             CONSTITUTIONAL: Well developed, and nourished, appropriately responsive and aware without distress.  She ambulates using a cane. EYES: Sclera non-icteric.   EARS, NOSE, MOUTH AND THROAT:  The oropharynx is clear. Oral mucosa is pink and moist.     Hearing is intact to voice.  NECK: Trachea is midline, and there is no jugular venous distension.  LYMPH NODES:  Lymph nodes in the neck are not appreciated. RESPIRATORY:  Normal respiratory effort without pathologic use of accessory muscles. CARDIOVASCULAR:  Well perfused.  GI: The abdomen is  soft, nontender, and nondistended. There were no palpable masses.  MUSCULOSKELETAL:  Symmetrical muscle tone appreciated in all four extremities.    SKIN: Skin turgor is normal. No pathologic skin lesions appreciated.  Normal distribution of soft tissue about the ankles, no distinct or defined lipomatous tissues whatsoever. NEUROLOGIC:  Motor and sensation appear grossly normal.  Cranial nerves are grossly without defect. PSYCH:  Alert and oriented to person, place and time. Affect is appropriate for situation.  Data Reviewed I have personally reviewed what is currently available of the patient's imaging, recent labs and medical records.   Labs:     Latest Ref Rng & Units 10/05/2022    1:42 PM 04/06/2022    1:38 PM 12/12/2020   10:17 AM  CBC  WBC 3.8 - 10.8 Thousand/uL 5.7  5.5  6.6   Hemoglobin 11.7 - 15.5 g/dL 40.9  81.1  91.4   Hematocrit 35.0 - 45.0 % 41.8  39.7  39.9   Platelets 140 - 400 Thousand/uL 303  301  286       Latest  Ref Rng & Units 10/05/2022    1:42 PM 04/06/2022    1:38 PM 12/12/2020   10:17 AM  CMP  Glucose 65 - 99 mg/dL 97  93  782   BUN 7 - 25 mg/dL 13  8  11    Creatinine 0.50 - 1.05 mg/dL 9.56  2.13  0.86   Sodium 135 - 146 mmol/L 141  140  141   Potassium 3.5 - 5.3 mmol/L 4.4  4.6  4.4   Chloride 98 - 110 mmol/L 106  107  107  CO2 20 - 32 mmol/L 26  26  24    Calcium 8.6 - 10.4 mg/dL 9.0  9.1  9.1   Total Protein 6.1 - 8.1 g/dL 7.1  7.6  7.5   Total Bilirubin 0.2 - 1.2 mg/dL 0.4  0.3  0.3   AST 10 - 35 U/L 16  14  17    ALT 6 - 29 U/L 21  14  16        Imaging: Radiological images reviewed:   Within last 24 hrs: No results found.  Assessment    No evidence of significant lipomatous disease surrounding these ankles. Patient Active Problem List   Diagnosis Date Noted   Chronic hip pain, bilateral 10/05/2022   Frequent headaches 04/06/2022   Aortic atherosclerosis (HCC) 12/12/2020   Class 3 severe obesity due to excess calories with body mass index (BMI) of 40.0 to 44.9 in adult [facility]) 12/12/2020   Osteopenia 09/28/2019   Chronic pain syndrome 09/09/2019   Insomnia 08/20/2019   Prediabetes 01/23/2018   Lumbosacral spondylosis without myelopathy 09/05/2016   Hypertension 02/13/2013   Chronic GERD 06/19/2012   Hyperlipidemia 02/17/2011   Fibromyalgia 12/01/2010    Plan    We discussed that these fatty tissue prominences were not consistent with lipomatous disease.  And did not advise any attempt at excising or diminishing these areas of fatty tissue surrounding her her ankles.  She apparently does have some pain in her ankles has for about 8 to 10 years associated with the increase in these subcutaneous tissues.  She would like to be referred for orthopedic evaluation.  Face-to-face time spent with the patient and accompanying care providers(if present) was 25 minutes, with more than 50% of the time spent counseling, educating, and coordinating care of the patient.    These notes  generated with voice recognition software. I apologize for typographical errors.  Campbell Lerner M.D., FACS 10/18/2022, 11:06 AM

## 2022-10-19 LAB — TOXASSURE SELECT 13 (MW), URINE

## 2022-10-26 ENCOUNTER — Ambulatory Visit (INDEPENDENT_AMBULATORY_CARE_PROVIDER_SITE_OTHER): Payer: 59

## 2022-10-26 VITALS — Ht 59.0 in | Wt 197.0 lb

## 2022-10-26 DIAGNOSIS — Z0000 Encounter for general adult medical examination without abnormal findings: Secondary | ICD-10-CM | POA: Diagnosis not present

## 2022-10-26 NOTE — Progress Notes (Signed)
I connected with  Kristin Porter on 10/26/22 by a audio enabled telemedicine application and verified that I am speaking with the correct person using two identifiers.  Patient Location: Home  Provider Location: Office/Clinic  I discussed the limitations of evaluation and management by telemedicine. The patient expressed understanding and agreed to proceed.  Subjective:   Kristin Porter is a 62 y.o. female who presents for Medicare Annual (Subsequent) preventive examination.  Review of Systems     Cardiac Risk Factors include: advanced age (>72men, >19 women);sedentary lifestyle;dyslipidemia;hypertension     Objective:    Today's Vitals   10/26/22 1129 10/26/22 1145  Weight:  197 lb (89.4 kg)  Height:  4\' 11"  (1.499 m)  PainSc: 5     Body mass index is 39.79 kg/m.     10/26/2022   11:37 AM 10/16/2022    8:55 AM 07/03/2022    1:40 PM 03/01/2022   11:21 AM 01/18/2022    1:39 PM 10/25/2021    2:11 PM 10/20/2021    3:05 PM  Advanced Directives  Does Patient Have a Medical Advance Directive? No Yes Yes No No No No  Type of Advance Directive  Living will       Does patient want to make changes to medical advance directive?  No - Patient declined   No - Patient declined    Would patient like information on creating a medical advance directive? No - Patient declined      No - Patient declined    Current Medications (verified) Outpatient Encounter Medications as of 10/26/2022  Medication Sig   albuterol (PROVENTIL) (2.5 MG/3ML) 0.083% nebulizer solution Take 3 mLs (2.5 mg total) by nebulization every 6 (six) hours as needed for wheezing or shortness of breath.   aspirin EC 81 MG tablet Take 1 tablet (81 mg total) by mouth daily. Swallow whole.   baclofen (LIORESAL) 10 MG tablet Take 1 tablet (10 mg total) by mouth 4 (four) times daily as needed for muscle spasms.   Blood Pressure Monitoring (BLOOD PRESSURE KIT) DEVI 1 Units by Does not apply route in the morning and at  bedtime.   butalbital-acetaminophen-caffeine (FIORICET) 50-325-40 MG tablet Take 1 tablet by mouth every 6 (six) hours as needed for headache.   ipratropium (ATROVENT) 0.06 % nasal spray Place 2 sprays into both nostrils 4 (four) times daily. For up to 5-7 days then stop.   meloxicam (MOBIC) 15 MG tablet Take 1 tablet (15 mg total) by mouth daily as needed for pain.   omeprazole (PRILOSEC) 40 MG capsule Take 1 capsule by mouth twice daily   PARoxetine (PAXIL) 10 MG tablet Take 1 tablet (10 mg total) by mouth daily.   pregabalin (LYRICA) 100 MG capsule Take 1-2 capsules (100-200 mg total) by mouth 2 (two) times daily.   Suvorexant (BELSOMRA) 5 MG TABS Take 1 tablet (5 mg total) by mouth at bedtime.   [START ON 11/22/2022] traMADol (ULTRAM) 50 MG tablet Take 1 tablet (50 mg total) by mouth daily as needed.   [START ON 11/22/2022] traMADol (ULTRAM-ER) 100 MG 24 hr tablet Take 1 tablet (100 mg total) by mouth daily.   triamcinolone cream (KENALOG) 0.1 % Apply 1 application topically 2 (two) times daily.   fluticasone (FLONASE) 50 MCG/ACT nasal spray Place 1 spray into both nostrils 2 (two) times daily. (Patient not taking: Reported on 10/26/2022)   No facility-administered encounter medications on file as of 10/26/2022.    Allergies (verified) Tizanidine, Lisinopril, and Tramadol  History: Past Medical History:  Diagnosis Date   Arthritis    Bilateral hips   Fibromyalgia    Hypertension    Neuropathy    Past Surgical History:  Procedure Laterality Date   BREAST BIOPSY Right 2018   benign, clip was placed   BREAST BIOPSY Left 07/12/2021   [place] bx , heart marker, path pending   CESAREAN SECTION     three total   ESOPHAGOGASTRODUODENOSCOPY (EGD) WITH PROPOFOL N/A 02/22/2021   Procedure: ESOPHAGOGASTRODUODENOSCOPY (EGD) WITH PROPOFOL;  Surgeon: Toney Reil, MD;  Location: [facility] ENDOSCOPY;  Service: Gastroenterology;  Laterality: N/A;   REPLACEMENT TOTAL KNEE Left    Family History   Family history unknown: Yes   Social History   Socioeconomic History   Marital status: Divorced    Spouse name: Not on file   Number of children: Not on file   Years of education: Not on file   Highest education level: Not on file  Occupational History   Occupation: disability   Tobacco Use   Smoking status: Former    Types: Cigarettes    Quit date: 1970    Years since quitting: 54.4    Passive exposure: Past   Smokeless tobacco: Never  Vaping Use   Vaping Use: Never used  Substance and Sexual Activity   Alcohol use: Not Currently   Drug use: Never   Sexual activity: Not Currently  Other Topics Concern   Not on file  Social History Narrative   Not on file   Social Determinants of Health   Financial Resource Strain: Low Risk  (10/26/2022)   Overall Financial Resource Strain (CARDIA)    Difficulty of Paying Living Expenses: Not hard at all  Food Insecurity: No Food Insecurity (10/26/2022)   Hunger Vital Sign    Worried About Running Out of Food in the Last Year: Never true    Ran Out of Food in the Last Year: Never true  Transportation Needs: No Transportation Needs (10/26/2022)   PRAPARE - Administrator, Civil Service (Medical): No    Lack of Transportation (Non-Medical): No  Physical Activity: Insufficiently Active (10/26/2022)   Exercise Vital Sign    Days of Exercise per Week: 2 days    Minutes of Exercise per Session: 20 min  Stress: No Stress Concern Present (10/26/2022)   [employer] of Occupational Health - Occupational Stress Questionnaire    Feeling of Stress : Not at all  Social Connections: Socially Isolated (10/26/2022)   Social Connection and Isolation Panel [NHANES]    Frequency of Communication with Friends and Family: More than three times a week    Frequency of Social Gatherings with Friends and Family: Three times a week    Attends Religious Services: Never    Active Member of Clubs or Organizations: No    Attends Hospital doctor: Never    Marital Status: Divorced    Tobacco Counseling Counseling given: Not Answered   Clinical Intake:  Pre-visit preparation completed: Yes  Pain Score: 5  Pain Type: Chronic pain Pain Location: Hip Pain Orientation: Right, Left     Nutritional Risks: None Diabetes: No  How often do you need to have someone help you when you read instructions, pamphlets, or other written materials from your doctor or pharmacy?: 1 - Never  Diabetic?no  Interpreter Needed?: No  Information entered by :: Kennedy Bucker,  LPN   Activities of Daily Living    10/26/2022   11:38 AM 10/05/2022  1:45 PM  In your present state of health, do you have any difficulty performing the following activities:  Hearing? 0 0  Vision? 0 0  Difficulty concentrating or making decisions? 1 0  Walking or climbing stairs? 0 1  Dressing or bathing? 0 0  Doing errands, shopping? 0 0  Preparing Food and eating ? N   Using the Toilet? N   In the past six months, have you accidently leaked urine? N   Do you have problems with loss of bowel control? N   Managing your Medications? N   Managing your Finances? N   Housekeeping or managing your Housekeeping? N     Patient Care Team: Lorre Munroe, NP as PCP - General (Internal Medicine) Debbe Odea, MD as PCP - Cardiology (Cardiology) Malfi, Jodelle Gross, FNP (Family Medicine) Kemper Durie, RN as [facility] Care Management  Indicate any recent Medical Services you may have received from other than [facility] providers in the past year (date may be approximate).     Assessment:   This is a routine wellness examination for English.  Hearing/Vision screen Hearing Screening - Comments:: No aids Vision Screening - Comments:: Wears glasses- Dr.Newman in [place]  Dietary issues and exercise activities discussed: Current Exercise Habits: Home exercise routine, Type of exercise: walking, Time (Minutes): 20, Frequency (Times/Week): 2,  Weekly Exercise (Minutes/Week): 40, Intensity: Mild   Goals Addressed             This Visit's Progress    DIET - INCREASE WATER INTAKE         Depression Screen    10/26/2022   11:36 AM 10/05/2022    1:44 PM 04/06/2022    1:36 PM 03/01/2022   11:21 AM 10/25/2021    2:11 PM 10/20/2021    3:03 PM 06/21/2021    1:09 PM  PHQ 2/9 Scores  PHQ - 2 Score 0 0 0 0 0 1 0  PHQ- 9 Score 0  5   2     Fall Risk    10/26/2022   11:37 AM 10/18/2022   10:49 AM 10/05/2022    1:45 PM 07/03/2022    1:40 PM 04/06/2022    1:37 PM  Fall Risk   Falls in the past year? 1 1 1  0 0  Number falls in past yr: 0 0 0    Injury with Fall? 0 0 0  0  Risk for fall due to : History of fall(s)  Impaired mobility    Follow up Falls evaluation completed;Falls prevention discussed        FALL RISK PREVENTION PERTAINING TO THE HOME:  Any stairs in or around the home? No  If so, are there any without handrails? No  Home free of loose throw rugs in walkways, pet beds, electrical cords, etc? Yes  Adequate lighting in your home to reduce risk of falls? Yes   ASSISTIVE DEVICES UTILIZED TO PREVENT FALLS:  Life alert? No  Use of a cane, walker or w/c? Yes -cane Grab bars in the bathroom? Yes  Shower chair or bench in shower? No  Elevated toilet seat or a handicapped toilet? No    Cognitive Function:        10/26/2022   11:41 AM 10/18/2020    2:59 PM  6CIT Screen  What Year? 0 points 0 points  What month? 0 points 0 points  What time? 0 points 0 points  Count back from 20 0 points 0 points  Months  in reverse 0 points 0 points  Repeat phrase 0 points 4 points  Total Score 0 points 4 points    Immunizations Immunization History  Administered Date(s) Administered   Influenza-Unspecified 02/26/2011, 02/21/2012   Tdap 09/04/2007, 04/24/2018    TDAP status: Up to date  Flu Vaccine status: Declined, Education has been provided regarding the importance of this vaccine but patient still declined. Advised  may receive this vaccine at local pharmacy or Health Dept. Aware to provide a copy of the vaccination record if obtained from local pharmacy or Health Dept. Verbalized acceptance and understanding.  Pneumococcal vaccine status: Declined,  Education has been provided regarding the importance of this vaccine but patient still declined. Advised may receive this vaccine at local pharmacy or Health Dept. Aware to provide a copy of the vaccination record if obtained from local pharmacy or Health Dept. Verbalized acceptance and understanding.   Covid-19 vaccine status: Declined, Education has been provided regarding the importance of this vaccine but patient still declined. Advised may receive this vaccine at local pharmacy or Health Dept.or vaccine clinic. Aware to provide a copy of the vaccination record if obtained from local pharmacy or Health Dept. Verbalized acceptance and understanding.  Qualifies for Shingles Vaccine? Yes   Zostavax completed No   Shingrix Completed?: No.    Education has been provided regarding the importance of this vaccine. Patient has been advised to call insurance company to determine out of pocket expense if they have not yet received this vaccine. Advised may also receive vaccine at local pharmacy or Health Dept. Verbalized acceptance and understanding.  Screening Tests Health Maintenance  Topic Date Due   Zoster Vaccines- Shingrix (1 of 2) Never done   INFLUENZA VACCINE  12/20/2022   Medicare Annual Wellness (AWV)  10/26/2023   MAMMOGRAM  09/13/2024   PAP SMEAR-Modifier  09/16/2024   DTaP/Tdap/Td (3 - Td or Tdap) 04/24/2028   Colonoscopy  03/09/2029   Hepatitis C Screening  Completed   HIV Screening  Completed   HPV VACCINES  Aged Out   COVID-19 Vaccine  Discontinued    Health Maintenance  Health Maintenance Due  Topic Date Due   Zoster Vaccines- Shingrix (1 of 2) Never done    Colorectal cancer screening: Type of screening: Colonoscopy. Completed 03/10/19.  Repeat every 10 years  Mammogram status: Completed 09/14/22. Repeat every year   Lung Cancer Screening: (Low Dose CT Chest recommended if Age 40-80 years, 30 pack-year currently smoking OR have quit w/in 15years.) does not qualify.    Additional Screening:  Hepatitis C Screening: does qualify; Completed 09/17/19  Vision Screening: Recommended annual ophthalmology exams for early detection of glaucoma and other disorders of the eye. Is the patient up to date with their annual eye exam?  Yes  Who is the provider or what is the name of the office in which the patient attends annual eye exams? Dr.Newman If pt is not established with a provider, would they like to be referred to a provider to establish care? No .   Dental Screening: Recommended annual dental exams for proper oral hygiene  Community Resource Referral / Chronic Care Management: CRR required this visit?  No   CCM required this visit?  No      Plan:     I have personally reviewed and noted the following in the patient's chart:   Medical and social history Use of alcohol, tobacco or illicit drugs  Current medications and supplements including opioid prescriptions. Patient is not currently taking  opioid prescriptions. Functional ability and status Nutritional status Physical activity Advanced directives List of other physicians Hospitalizations, surgeries, and ER visits in previous 12 months Vitals Screenings to include cognitive, depression, and falls Referrals and appointments  In addition, I have reviewed and discussed with patient certain preventive protocols, quality metrics, and best practice recommendations. A written personalized care plan for preventive services as well as general preventive health recommendations were provided to patient.     Hal Hope, LPN   [id number]   Nurse Notes: none

## 2022-10-26 NOTE — Patient Instructions (Signed)
[name] , Thank you for taking time to come for your Medicare Wellness Visit. I appreciate your ongoing commitment to your health goals. Please review the following plan we discussed and let me know if I can assist you in the future.   These are the goals we discussed:  Goals      DIET - EAT MORE FRUITS AND VEGETABLES     DIET - INCREASE WATER INTAKE     Patient Stated     10/18/2020, wants to weigh 125-135 pounds     Relief of congestion     Care Coordination Interventions: Evaluation of current treatment plan related to sinus congestion and pressure and patient's adherence to plan as established by provider Advised patient to use Flonase as instructed.  Also advised to use steam from shower to open nasal passages Reviewed medications with patient and discussed Obtaining Flonase and use of Sinus medication as needed Reviewed scheduled/upcoming provider appointments including PCP on 5/17 and [facility] on 6/7 Congestion is better, now having pain and drainage from eyes.  Feels this may be sinus/allergy related, will use eye drops to see if she gets relief.       RNCM: Effective Management of Pain     Care Coordination Interventions:  Reviewed provider established plan for pain management. Sees specialist on a regular basis.  Discussed importance of adherence to all scheduled medical appointments Canceled appointment with pain management clinic last week due to not feeling well, will call to reschedule Counseled on the importance of reporting any/all new or changed pain symptoms or management strategies to pain management provider. Education provided Advised patient to report to care team affect of pain on daily activities. The patient is unable to do exercises Discussed use of relaxation techniques and/or diversional activities to assist with pain reduction (distraction, imagery, relaxation, massage, acupressure, TENS, heat, and cold application. Discussed journal writing, light therapy, music  therapy, mindfulness, wax therapy. The patient is open to ideas to help with effective management of her pain.  Reviewed with patient prescribed pharmacological and nonpharmacological pain relief strategies.  Now using Tramadol extended release and for breakthrough pain, however continue to rate 8/10 (6 is reported bearable).  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   Advised patient to discuss unresolved pain, changes in level or intensity of pain with provider Follow up with pain clinic on 5/7, will discuss autoimmune therapy for treatment versus regular pain medications          This is a list of the screening recommended for you and due dates:  Health Maintenance  Topic Date Due   Zoster (Shingles) Vaccine (1 of 2) Never done   Flu Shot  12/20/2022   Medicare Annual Wellness Visit  10/26/2023   Mammogram  09/13/2024   Pap Smear  09/16/2024   DTaP/Tdap/Td vaccine (3 - Td or Tdap) 04/24/2028   Colon Cancer Screening  03/09/2029   Hepatitis C Screening  Completed   HIV Screening  Completed   HPV Vaccine  Aged Out   COVID-19 Vaccine  Discontinued    Advanced directives: no  Conditions/risks identified: none  Next appointment: Follow up in one year for your annual wellness visit. 11/01/23 @ 10:45 am by phone  Preventive Care 40-64 Years, Female Preventive care refers to lifestyle choices and visits with your health care provider that can promote health and wellness. What does preventive care include? A yearly physical exam. This is also called an annual well check. Dental exams once or  twice a year. Routine eye exams. Ask your health care provider how often you should have your eyes checked. Personal lifestyle choices, including: Daily care of your teeth and gums. Regular physical activity. Eating a healthy diet. Avoiding tobacco and drug use. Limiting alcohol use. Practicing safe sex. Taking low-dose aspirin daily starting at age 60. Taking vitamin and  mineral supplements as recommended by your health care provider. What happens during an annual well check? The services and screenings done by your health care provider during your annual well check will depend on your age, overall health, lifestyle risk factors, and family history of disease. Counseling  Your health care provider may ask you questions about your: Alcohol use. Tobacco use. Drug use. Emotional well-being. Home and relationship well-being. Sexual activity. Eating habits. Work and work Astronomer. Method of birth control. Menstrual cycle. Pregnancy history. Screening  You may have the following tests or measurements: Height, weight, and BMI. Blood pressure. Lipid and cholesterol levels. These may be checked every 5 years, or more frequently if you are over 54 years old. Skin check. Lung cancer screening. You may have this screening every year starting at age 3 if you have a 30-pack-year history of smoking and currently smoke or have quit within the past 15 years. Fecal occult blood test (FOBT) of the stool. You may have this test every year starting at age 110. Flexible sigmoidoscopy or colonoscopy. You may have a sigmoidoscopy every 5 years or a colonoscopy every 10 years starting at age 49. Hepatitis C blood test. Hepatitis B blood test. Sexually transmitted disease (STD) testing. Diabetes screening. This is done by checking your blood sugar (glucose) after you have not eaten for a while (fasting). You may have this done every 1-3 years. Mammogram. This may be done every 1-2 years. Talk to your health care provider about when you should start having regular mammograms. This may depend on whether you have a family history of breast cancer. BRCA-related cancer screening. This may be done if you have a family history of breast, ovarian, tubal, or peritoneal cancers. Pelvic exam and Pap test. This may be done every 3 years starting at age 44. Starting at age 85, this may  be done every 5 years if you have a Pap test in combination with an HPV test. Bone density scan. This is done to screen for osteoporosis. You may have this scan if you are at high risk for osteoporosis. Discuss your test results, treatment options, and if necessary, the need for more tests with your health care provider. Vaccines  Your health care provider may recommend certain vaccines, such as: Influenza vaccine. This is recommended every year. Tetanus, diphtheria, and acellular pertussis (Tdap, Td) vaccine. You may need a Td booster every 10 years. Zoster vaccine. You may need this after age 67. Pneumococcal 13-valent conjugate (PCV13) vaccine. You may need this if you have certain conditions and were not previously vaccinated. Pneumococcal polysaccharide (PPSV23) vaccine. You may need one or two doses if you smoke cigarettes or if you have certain conditions. Talk to your health care provider about which screenings and vaccines you need and how often you need them. This information is not intended to replace advice given to you by your health care provider. Make sure you discuss any questions you have with your health care provider. Document Released: 06/03/2015 Document Revised: 01/25/2016 Document Reviewed: 03/08/2015 Elsevier Interactive Patient Education  2017 [employer].    Fall Prevention in the Home Falls can cause injuries. They  can happen to people of all ages. There are many things you can do to make your home safe and to help prevent falls. What can I do on the outside of my home? Regularly fix the edges of walkways and driveways and fix any cracks. Remove anything that might make you trip as you walk through a door, such as a raised step or threshold. Trim any bushes or trees on the path to your home. Use bright outdoor lighting. Clear any walking paths of anything that might make someone trip, such as rocks or tools. Regularly check to see if handrails are loose or  broken. Make sure that both sides of any steps have handrails. Any raised decks and porches should have guardrails on the edges. Have any leaves, snow, or ice cleared regularly. Use sand or salt on walking paths during winter. Clean up any spills in your garage right away. This includes oil or grease spills. What can I do in the bathroom? Use night lights. Install grab bars by the toilet and in the tub and shower. Do not use towel bars as grab bars. Use non-skid mats or decals in the tub or shower. If you need to sit down in the shower, use a plastic, non-slip stool. Keep the floor dry. Clean up any water that spills on the floor as soon as it happens. Remove soap buildup in the tub or shower regularly. Attach bath mats securely with double-sided non-slip rug tape. Do not have throw rugs and other things on the floor that can make you trip. What can I do in the bedroom? Use night lights. Make sure that you have a light by your bed that is easy to reach. Do not use any sheets or blankets that are too big for your bed. They should not hang down onto the floor. Have a firm chair that has side arms. You can use this for support while you get dressed. Do not have throw rugs and other things on the floor that can make you trip. What can I do in the kitchen? Clean up any spills right away. Avoid walking on wet floors. Keep items that you use a lot in easy-to-reach places. If you need to reach something above you, use a strong step stool that has a grab bar. Keep electrical cords out of the way. Do not use floor polish or wax that makes floors slippery. If you must use wax, use non-skid floor wax. Do not have throw rugs and other things on the floor that can make you trip. What can I do with my stairs? Do not leave any items on the stairs. Make sure that there are handrails on both sides of the stairs and use them. Fix handrails that are broken or loose. Make sure that handrails are as long as  the stairways. Check any carpeting to make sure that it is firmly attached to the stairs. Fix any carpet that is loose or worn. Avoid having throw rugs at the top or bottom of the stairs. If you do have throw rugs, attach them to the floor with carpet tape. Make sure that you have a light switch at the top of the stairs and the bottom of the stairs. If you do not have them, ask someone to add them for you. What else can I do to help prevent falls? Wear shoes that: Do not have high heels. Have rubber bottoms. Are comfortable and fit you well. Are closed at the toe. Do not wear  sandals. If you use a stepladder: Make sure that it is fully opened. Do not climb a closed stepladder. Make sure that both sides of the stepladder are locked into place. Ask someone to hold it for you, if possible. Clearly mark and make sure that you can see: Any grab bars or handrails. First and last steps. Where the edge of each step is. Use tools that help you move around (mobility aids) if they are needed. These include: Canes. Walkers. Scooters. Crutches. Turn on the lights when you go into a dark area. Replace any light bulbs as soon as they burn out. Set up your furniture so you have a clear path. Avoid moving your furniture around. If any of your floors are uneven, fix them. If there are any pets around you, be aware of where they are. Review your medicines with your doctor. Some medicines can make you feel dizzy. This can increase your chance of falling. Ask your doctor what other things that you can do to help prevent falls. This information is not intended to replace advice given to you by your health care provider. Make sure you discuss any questions you have with your health care provider. Document Released: 03/03/2009 Document Revised: 10/13/2015 Document Reviewed: 06/11/2014 Elsevier Interactive Patient Education  2017 [employer].

## 2022-10-30 ENCOUNTER — Ambulatory Visit (INDEPENDENT_AMBULATORY_CARE_PROVIDER_SITE_OTHER): Payer: 59 | Admitting: Physician Assistant

## 2022-10-30 ENCOUNTER — Encounter: Payer: Self-pay | Admitting: Physician Assistant

## 2022-10-30 VITALS — BP 138/79 | HR 70 | Temp 98.4°F | Ht 59.0 in | Wt 202.4 lb

## 2022-10-30 DIAGNOSIS — K5904 Chronic idiopathic constipation: Secondary | ICD-10-CM

## 2022-10-30 DIAGNOSIS — Z8619 Personal history of other infectious and parasitic diseases: Secondary | ICD-10-CM | POA: Diagnosis not present

## 2022-10-30 DIAGNOSIS — K219 Gastro-esophageal reflux disease without esophagitis: Secondary | ICD-10-CM | POA: Diagnosis not present

## 2022-10-30 DIAGNOSIS — R1319 Other dysphagia: Secondary | ICD-10-CM

## 2022-10-30 MED ORDER — PANTOPRAZOLE SODIUM 40 MG PO TBEC
40.0000 mg | DELAYED_RELEASE_TABLET | Freq: Two times a day (BID) | ORAL | 2 refills | Status: DC
Start: 2022-10-30 — End: 2023-06-18

## 2022-10-30 MED ORDER — POLYETHYLENE GLYCOL 3350 17 GM/SCOOP PO POWD: ORAL | 3 refills | Status: DC

## 2022-10-30 NOTE — Patient Instructions (Addendum)
Barium swallow study scheduled @ [facility] 11/05/2022 @ 8:45 am arrival.Please come back tomorrow for your H.Pylori test between 8:30 am and 4 pm.

## 2022-10-30 NOTE — Progress Notes (Signed)
Kristin Amy, PA-C [address]  [address]  [place], [place] [address]  Main: [phone]  Fax: [phone]   Primary Care Physician: Lorre Munroe, NP  Primary Gastroenterologist:  Dr. Lannette Donath / Kristin Amy, PA-C   CC: Trouble Swallowing  HPI: Kristin Porter is a 62 y.o. female, established patient of Dr. Allegra Lai, presents for difficulty swallowing.  Patient states for the past 6 months she has had difficulty with solid food going down.  She feels regurgitation after she eats.  She has not had any food bolus or vomiting episodes.  Has some epigastric discomfort.  No other abdominal pain.  She takes omeprazole 40 Mg once and sometimes twice daily which is not controlling her reflux.  Occasionally takes Tums as needed.  No recent barium swallow test.  She has not tried any other PPIs.  She feels acid coming up into her throat if she bends over.  She has chronic constipation.  No current treatment.  She is having a bowel movement every 3 days with hard stools and straining.  Feels bloated.  Last EGD done by Dr. Allegra Lai 02/2021, to evaluate GERD, showed mild gastritis, otherwise normal.  Biopsies were positive for H. pylori and chronic gastritis.  No dysplasia.  Did for H. pylori with clarithromycin, amoxicillin, and omeprazole.  She has not had follow-up testing for H. pylori since then.  Last colonoscopy 02/2019 by [facility] showed sigmoid diverticulosis, hemorrhoids, and no polyps.  She had previous history of colon polyps in 2014, therefore repeat colonoscopy was recommended in 7 years (due 02/2026).   Current Outpatient Medications  Medication Sig Dispense Refill   albuterol (PROVENTIL) (2.5 MG/3ML) 0.083% nebulizer solution Take 3 mLs (2.5 mg total) by nebulization every 6 (six) hours as needed for wheezing or shortness of breath. 75 mL 0   aspirin EC 81 MG tablet Take 1 tablet (81 mg total) by mouth daily. Swallow whole. 30 tablet 12   baclofen (LIORESAL) 10 MG  tablet Take 1 tablet (10 mg total) by mouth 4 (four) times daily as needed for muscle spasms. 120 each 5   Blood Pressure Monitoring (BLOOD PRESSURE KIT) DEVI 1 Units by Does not apply route in the morning and at bedtime. 1 each 0   butalbital-acetaminophen-caffeine (FIORICET) 50-325-40 MG tablet Take 1 tablet by mouth every 6 (six) hours as needed for headache. 14 tablet 0   fluticasone (FLONASE) 50 MCG/ACT nasal spray Place 1 spray into both nostrils 2 (two) times daily. (Patient not taking: Reported on 10/26/2022) 9.9 mL 1   ipratropium (ATROVENT) 0.06 % nasal spray Place 2 sprays into both nostrils 4 (four) times daily. For up to 5-7 days then stop. 15 mL 0   meloxicam (MOBIC) 15 MG tablet Take 1 tablet (15 mg total) by mouth daily as needed for pain. 90 tablet 1   omeprazole (PRILOSEC) 40 MG capsule Take 1 capsule by mouth twice daily 180 capsule 1   PARoxetine (PAXIL) 10 MG tablet Take 1 tablet (10 mg total) by mouth daily. 90 tablet 1   pregabalin (LYRICA) 100 MG capsule Take 1-2 capsules (100-200 mg total) by mouth 2 (two) times daily. 60 capsule 5   Suvorexant (BELSOMRA) 5 MG TABS Take 1 tablet (5 mg total) by mouth at bedtime. 30 tablet 0   [START ON 11/22/2022] traMADol (ULTRAM) 50 MG tablet Take 1 tablet (50 mg total) by mouth daily as needed. 30 tablet 2   [START ON 11/22/2022] traMADol (ULTRAM-ER) 100  MG 24 hr tablet Take 1 tablet (100 mg total) by mouth daily. 30 tablet 2   triamcinolone cream (KENALOG) 0.1 % Apply 1 application topically 2 (two) times daily. 30 g 0   No current facility-administered medications for this visit.    Allergies as of 10/30/2022 - Review Complete 10/26/2022  Allergen Reaction Noted   Tizanidine Other (See Comments) 07/08/2017   Lisinopril Cough 06/23/2013   Tramadol Itching 12/18/2016    Past Medical History:  Diagnosis Date   Arthritis    Bilateral hips   Fibromyalgia    Hypertension    Neuropathy     Past Surgical History:  Procedure  Laterality Date   BREAST BIOPSY Right 2018   benign, clip was placed   BREAST BIOPSY Left 07/12/2021   [place] bx , heart marker, path pending   CESAREAN SECTION     three total   ESOPHAGOGASTRODUODENOSCOPY (EGD) WITH PROPOFOL N/A 02/22/2021   Procedure: ESOPHAGOGASTRODUODENOSCOPY (EGD) WITH PROPOFOL;  Surgeon: Toney Reil, MD;  Location: [facility] ENDOSCOPY;  Service: Gastroenterology;  Laterality: N/A;   REPLACEMENT TOTAL KNEE Left     Review of Systems:    All systems reviewed and negative except where noted in HPI.   Physical Examination:   There were no vitals taken for this visit.  General: Well-nourished, well-developed in no acute distress.  Eyes: No icterus. Conjunctivae pink. Mouth: Oropharyngeal mucosa moist and pink , no lesions erythema or exudate. Lungs: Clear to auscultation bilaterally. Non-labored. Heart: Regular rate and rhythm, no murmurs rubs or gallops.  Abdomen: Bowel sounds are normal; Abdomen is Soft and obese; No hepatosplenomegaly, masses or hernias; Mild Epigastric Abdominal Tenderness; rest of abdomen is nontender.  No guarding or rebound tenderness. Extremities: No lower extremity edema. No clubbing or deformities. Neuro: Alert and oriented x 3.  Grossly intact. Skin: Warm and dry, no jaundice.   Psych: Alert and cooperative, normal mood and affect.   Imaging Studies: DG HIPS BILAT WITH PELVIS MIN 5 VIEWS  Result Date: 10/13/2022 CLINICAL DATA:  Pain. EXAM: DG HIP (WITH OR WITHOUT PELVIS) 5V BILAT COMPARISON:  03/09/2018 FINDINGS: There is no evidence of hip fracture or dislocation. Pelvic ring intact. No osteolytic or osteoblastic lesions. Bilateral hip osteoarthritis noted with joint space narrowing small osteophytes. IMPRESSION: Bilateral hip degenerative changes. Electronically Signed   By: Layla Maw M.D.   On: 10/13/2022 09:45    Assessment and Plan:   Promiss Kristin Porter is a 62 y.o. y/o female presents for:  Chronic GERD  Stop  Omeprazole  Start Pantoprazole 40mg  1 tab BID. Recommend Lifestyle Modifications to prevent Acid Reflux.  Rec. Avoid coffee, sodas, peppermint, citrus fruits, and spicey foods.  Avoid eating 2-3 hours before bedtime.   H. pylori gastritis -seen on EGD 02/2021 treated with amox / clarith / PPI  H. pylori breath test  Dysphagia  Barium Swallow With Tablet  Pending Ba Swallow results, then decide about repeat EGD.  Chronic Constipation Recommend High Fiber diet with fruits, vegetables, and whole grains. Drink 64 ounces of Fluids Daily. Start Miralax Mix 1 capful in a drink daily.   History of colon polyps  Negative colonoscopy 02/2019.  7-year repeat colonoscopy will be due 02/2026.    Kristin Amy, PA-C  Follow up in 4 weeks.

## 2022-10-31 DIAGNOSIS — Z8619 Personal history of other infectious and parasitic diseases: Secondary | ICD-10-CM | POA: Diagnosis not present

## 2022-11-02 ENCOUNTER — Telehealth: Payer: Self-pay | Admitting: Pharmacist

## 2022-11-02 ENCOUNTER — Telehealth: Payer: 59

## 2022-11-02 ENCOUNTER — Telehealth: Payer: Self-pay

## 2022-11-02 LAB — H. PYLORI BREATH TEST: H pylori Breath Test: NEGATIVE

## 2022-11-02 NOTE — Progress Notes (Signed)
Notify patient H. pylori breath test is negative.  No current H. pylori infection.  Continue with plan.

## 2022-11-02 NOTE — Telephone Encounter (Signed)
   Outreach Note  11/02/2022 Name: Kristin Porter MRN: [id number] DOB: 1961/04/29  Referred by: Lorre Munroe, NP Reason for referral : No chief complaint on file.   Was unable to reach patient via telephone today and have left HIPAA compliant voicemail asking patient to return my call.    Estelle Grumbles, PharmD, [facility] Clinical Pharmacist [facility] [phone]

## 2022-11-02 NOTE — Telephone Encounter (Signed)
Patient notified.  Notify patient H. pylori breath test is negative.  No current H. pylori infection.  Continue with plan.

## 2022-11-05 ENCOUNTER — Other Ambulatory Visit: Payer: Self-pay | Admitting: Physician Assistant

## 2022-11-05 ENCOUNTER — Ambulatory Visit
Admission: RE | Admit: 2022-11-05 | Discharge: 2022-11-05 | Disposition: A | Discharge status: Home / Self Care | Payer: 59 | Source: Ambulatory Visit | Attending: Physician Assistant | Admitting: Physician Assistant

## 2022-11-05 ENCOUNTER — Telehealth: Payer: Self-pay

## 2022-11-05 DIAGNOSIS — R1319 Other dysphagia: Secondary | ICD-10-CM | POA: Insufficient documentation

## 2022-11-05 DIAGNOSIS — F17210 Nicotine dependence, cigarettes, uncomplicated: Secondary | ICD-10-CM | POA: Diagnosis not present

## 2022-11-05 DIAGNOSIS — K224 Dyskinesia of esophagus: Secondary | ICD-10-CM | POA: Diagnosis not present

## 2022-11-05 DIAGNOSIS — Z8619 Personal history of other infectious and parasitic diseases: Secondary | ICD-10-CM

## 2022-11-05 DIAGNOSIS — R0600 Dyspnea, unspecified: Secondary | ICD-10-CM | POA: Diagnosis not present

## 2022-11-05 DIAGNOSIS — K5904 Chronic idiopathic constipation: Secondary | ICD-10-CM

## 2022-11-05 DIAGNOSIS — K219 Gastro-esophageal reflux disease without esophagitis: Secondary | ICD-10-CM

## 2022-11-05 NOTE — Telephone Encounter (Signed)
Patient notified.   Notify patient barium swallow test shows esophageal dysmotility.  There is no evidence of hiatal hernia or spontaneous acid reflux.  No esophageal stricture.  Barium tablet passed with no delay.  Continue current treatment for acid reflux and f/u 7/11.  Recommend eat slowly, chew food well, and eat small meals.  Stay sitting upright at least 1 or 2 hours after eating.

## 2022-11-05 NOTE — Progress Notes (Signed)
Notify patient barium swallow test shows esophageal dysmotility.  There is no evidence of hiatal hernia or spontaneous acid reflux.  No esophageal stricture.  Barium tablet passed with no delay.  Continue current treatment for acid reflux and f/u 7/11.  Recommend eat slowly, chew food well, and eat small meals.  Stay sitting upright at least 1 or 2 hours after eating.

## 2022-11-29 ENCOUNTER — Ambulatory Visit: Payer: 59 | Admitting: Physician Assistant

## 2022-11-29 ENCOUNTER — Encounter: Payer: Self-pay | Admitting: Physician Assistant

## 2022-11-29 NOTE — Progress Notes (Deleted)
Celso Amy, PA-C [address]  [address]  [place], [place] [address]  Main: [phone]  Fax: [phone]   Primary Care Physician: Lorre Munroe, NP  Primary Gastroenterologist:  Celso Amy, PA-C / Dr. Lannette Donath    CC: F/U GERD and Dysphagia  HPI: Kristin Porter is a 62 y.o. female returns for 1 month follow-up of chronic GERD, history of H. pylori, dysphagia, and chronic constipation.  One month ago she was switched from omeprazole to pantoprazole 40 Mg twice daily. Started on MiraLAX for constipation.  Lifestyle modification.  H. pylori breath test 10/31/2022 was negative.  Previous H. pylori eradicated post antibiotic treatment.  Barium swallow with tablet 11/05/2022 showed esophageal dysmotility.  NO evidence of GERD, esophageal stricture or hiatal hernia.  Normal esophagus.  Current symptoms:  Last EGD done by Dr. Allegra Lai 02/2021, to evaluate GERD, showed mild gastritis, otherwise normal.  Biopsies were positive for H. pylori and chronic gastritis.  No dysplasia.  She was treated for H. pylori with clarithromycin, amoxicillin, and omeprazole.     Last colonoscopy 02/2019 by [facility] showed sigmoid diverticulosis, hemorrhoids, and no polyps.  She had previous history of colon polyps in 2014, therefore repeat colonoscopy was recommended in 7 years (due 02/2026).  Current Outpatient Medications  Medication Sig Dispense Refill   albuterol (PROVENTIL) (2.5 MG/3ML) 0.083% nebulizer solution Take 3 mLs (2.5 mg total) by nebulization every 6 (six) hours as needed for wheezing or shortness of breath. 75 mL 0   aspirin EC 81 MG tablet Take 1 tablet (81 mg total) by mouth daily. Swallow whole. 30 tablet 12   baclofen (LIORESAL) 10 MG tablet Take 1 tablet (10 mg total) by mouth 4 (four) times daily as needed for muscle spasms. 120 each 5   Blood Pressure Monitoring (BLOOD PRESSURE KIT) DEVI 1 Units by Does not apply route in the morning and at bedtime. 1 each 0    butalbital-acetaminophen-caffeine (FIORICET) 50-325-40 MG tablet Take 1 tablet by mouth every 6 (six) hours as needed for headache. 14 tablet 0   fluticasone (FLONASE) 50 MCG/ACT nasal spray Place 1 spray into both nostrils 2 (two) times daily. 9.9 mL 1   ipratropium (ATROVENT) 0.06 % nasal spray Place 2 sprays into both nostrils 4 (four) times daily. For up to 5-7 days then stop. 15 mL 0   meloxicam (MOBIC) 15 MG tablet Take 1 tablet (15 mg total) by mouth daily as needed for pain. 90 tablet 1   pantoprazole (PROTONIX) 40 MG tablet Take 1 tablet (40 mg total) by mouth 2 (two) times daily. 60 tablet 2   PARoxetine (PAXIL) 10 MG tablet Take 1 tablet (10 mg total) by mouth daily. 90 tablet 1   polyethylene glycol powder (GLYCOLAX/MIRALAX) 17 GM/SCOOP powder Mix 1 capful in a Drink Once daily for Constipation. 255 g 3   pregabalin (LYRICA) 100 MG capsule Take 1-2 capsules (100-200 mg total) by mouth 2 (two) times daily. 60 capsule 5   Suvorexant (BELSOMRA) 5 MG TABS Take 1 tablet (5 mg total) by mouth at bedtime. 30 tablet 0   traMADol (ULTRAM) 50 MG tablet Take 1 tablet (50 mg total) by mouth daily as needed. 30 tablet 2   traMADol (ULTRAM-ER) 100 MG 24 hr tablet Take 1 tablet (100 mg total) by mouth daily. 30 tablet 2   triamcinolone cream (KENALOG) 0.1 % Apply 1 application topically 2 (two) times daily. 30 g 0   No current facility-administered medications for  this visit.    Allergies as of 11/29/2022 - Review Complete 10/30/2022  Allergen Reaction Noted   Tizanidine Other (See Comments) 07/08/2017   Lisinopril Cough 06/23/2013   Tramadol Itching 12/18/2016    Past Medical History:  Diagnosis Date   Arthritis    Bilateral hips   Fibromyalgia    Hypertension    Neuropathy     Past Surgical History:  Procedure Laterality Date   BREAST BIOPSY Right 2018   benign, clip was placed   BREAST BIOPSY Left 07/12/2021   [place] bx , heart marker, path pending   CESAREAN SECTION     three  total   ESOPHAGOGASTRODUODENOSCOPY (EGD) WITH PROPOFOL N/A 02/22/2021   Procedure: ESOPHAGOGASTRODUODENOSCOPY (EGD) WITH PROPOFOL;  Surgeon: Toney Reil, MD;  Location: [facility] ENDOSCOPY;  Service: Gastroenterology;  Laterality: N/A;   REPLACEMENT TOTAL KNEE Left     Review of Systems:    All systems reviewed and negative except where noted in HPI.   Physical Examination:   There were no vitals taken for this visit.  General: Well-nourished, well-developed in no acute distress.  Eyes: No icterus. Conjunctivae pink. Mouth: Oropharyngeal mucosa moist and pink , no lesions erythema or exudate. Lungs: Clear to auscultation bilaterally. Non-labored. Heart: Regular rate and rhythm, no murmurs rubs or gallops.  Abdomen: Bowel sounds are normal; Abdomen is Soft; No hepatosplenomegaly, masses or hernias;  No Abdominal Tenderness; No guarding or rebound tenderness. Extremities: No lower extremity edema. No clubbing or deformities. Neuro: Alert and oriented x 3.  Grossly intact. Skin: Warm and dry, no jaundice.   Psych: Alert and cooperative, normal mood and affect.   Imaging Studies: DG ESOPHAGUS W DOUBLE CM (HD)  Result Date: 11/05/2022 CLINICAL DATA:  Patient with complaints of the sensation of regurgitation even when not eating or drinking. EXAM: ESOPHAGUS/BARIUM SWALLOW/TABLET STUDY TECHNIQUE: Combined double and single contrast examination was performed using effervescent crystals, high-density barium, and thin liquid barium. This exam was performed by Alwyn Ren, NP, and was supervised and interpreted by Dr. Agustin Cree. FLUOROSCOPY: Radiation Exposure Index (as provided by the fluoroscopic device): 35.10 mGy Kerma COMPARISON:  Chest radiograph dated 12/23/2018 FINDINGS: Swallowing: Appears normal. No vestibular penetration or aspiration seen. Pharynx: Unremarkable. Esophagus: Normal appearance. Esophageal motility: Occasional mild tertiary contractions. Retrograde motion of small  volume contrast demonstrated. Hiatal Hernia: None. Gastroesophageal reflux: None visualized. Ingested 13mm barium tablet: No obstruction to the passage of a 13 mm tablet. Other: None. IMPRESSION: 1. Esophageal dysmotility demonstrated by tertiary contractions and intermittent retrograde motion of esophageal contrast. 2. No frank gastroesophageal reflux demonstrated. Electronically Signed   By: Agustin Cree M.D.   On: 11/05/2022 10:47    Assessment and Plan:   Kristin Porter is a 62 y.o. y/o female ***    Celso Amy, PA-C  Follow up in ***  BP check ***

## 2022-11-30 DIAGNOSIS — H5213 Myopia, bilateral: Secondary | ICD-10-CM | POA: Diagnosis not present

## 2022-11-30 DIAGNOSIS — H52223 Regular astigmatism, bilateral: Secondary | ICD-10-CM | POA: Diagnosis not present

## 2022-11-30 DIAGNOSIS — H524 Presbyopia: Secondary | ICD-10-CM | POA: Diagnosis not present

## 2023-01-11 ENCOUNTER — Other Ambulatory Visit: Payer: Self-pay

## 2023-01-11 ENCOUNTER — Emergency Department (HOSPITAL_BASED_OUTPATIENT_CLINIC_OR_DEPARTMENT_OTHER)
Admission: EM | Admit: 2023-01-11 | Discharge: 2023-01-11 | Disposition: A | Discharge status: Home / Self Care | Payer: 59 | Attending: Emergency Medicine | Admitting: Emergency Medicine

## 2023-01-11 ENCOUNTER — Encounter (HOSPITAL_BASED_OUTPATIENT_CLINIC_OR_DEPARTMENT_OTHER): Payer: Self-pay

## 2023-01-11 DIAGNOSIS — R52 Pain, unspecified: Secondary | ICD-10-CM

## 2023-01-11 DIAGNOSIS — Z7982 Long term (current) use of aspirin: Secondary | ICD-10-CM | POA: Insufficient documentation

## 2023-01-11 DIAGNOSIS — I10 Essential (primary) hypertension: Secondary | ICD-10-CM | POA: Diagnosis not present

## 2023-01-11 DIAGNOSIS — M791 Myalgia, unspecified site: Secondary | ICD-10-CM | POA: Diagnosis not present

## 2023-01-11 MED ORDER — KETOROLAC TROMETHAMINE 15 MG/ML IJ SOLN
30.0000 mg | Freq: Once | INTRAMUSCULAR | Status: AC
  Administered 2023-01-11: 30 mg via INTRAMUSCULAR
  Filled 2023-01-11: qty 2

## 2023-01-11 MED ORDER — MORPHINE SULFATE (PF) 4 MG/ML IV SOLN
8.0000 mg | Freq: Once | INTRAVENOUS | Status: AC
  Administered 2023-01-11: 8 mg via INTRAMUSCULAR
  Filled 2023-01-11: qty 2

## 2023-01-11 NOTE — ED Provider Notes (Signed)
[place] EMERGENCY DEPARTMENT AT [facility] Provider Note   CSN: [id number] Arrival date & time: 01/11/23  1711     History  Chief Complaint  Patient presents with   Generalized Body Aches    Kristin Porter is a 62 y.o. female hx of fibromyalgia, chronic pain syndrome, HTN, lumbosacral spondylosis that p/w generalized pain.   1 wk hx of fibromyalgia flair manifesting as generalized pain. Has flair of fibromyalgia when seasons change. Pain is "all over", especially in her lower back, but also in Ue's, LE's, head. Has tingly feeling all over, like bug are crawling under her skin. Her nerves feel jittery. Pain is 10/10. In the past, she comes to the ED for these flairs. Pt reports that she has received morphine in the past for this and found morphine the most helpful. She has also gotten Toradol in the past.  No fevers, no new LE weakness, no incontinence. No dysuria. No change to PO intake. No new trauma.        Home Medications Prior to Admission medications   Medication Sig Start Date End Date Taking? Authorizing Provider  albuterol (PROVENTIL) (2.5 MG/3ML) 0.083% nebulizer solution Take 3 mLs (2.5 mg total) by nebulization every 6 (six) hours as needed for wheezing or shortness of breath. 12/12/20   Lorre Munroe, NP  aspirin EC 81 MG tablet Take 1 tablet (81 mg total) by mouth daily. Swallow whole. 10/05/22   Lorre Munroe, NP  baclofen (LIORESAL) 10 MG tablet Take 1 tablet (10 mg total) by mouth 4 (four) times daily as needed for muscle spasms. 01/30/22   Edward Jolly, MD  Blood Pressure Monitoring (BLOOD PRESSURE KIT) DEVI 1 Units by Does not apply route in the morning and at bedtime. 08/20/19   Malfi, Jodelle Gross, FNP  butalbital-acetaminophen-caffeine (FIORICET) [phone] MG tablet Take 1 tablet by mouth every 6 (six) hours as needed for headache. 10/05/22   Lorre Munroe, NP  fluticasone (FLONASE) 50 MCG/ACT nasal spray Place 1 spray into both nostrils 2 (two)  times daily. 06/25/22   Lorre Munroe, NP  ipratropium (ATROVENT) 0.06 % nasal spray Place 2 sprays into both nostrils 4 (four) times daily. For up to 5-7 days then stop. 10/05/22   Lorre Munroe, NP  meloxicam (MOBIC) 15 MG tablet Take 1 tablet (15 mg total) by mouth daily as needed for pain. 10/16/22   Edward Jolly, MD  pantoprazole (PROTONIX) 40 MG tablet Take 1 tablet (40 mg total) by mouth 2 (two) times daily. 10/30/22 01/28/23  Celso Amy, PA-C  PARoxetine (PAXIL) 10 MG tablet Take 1 tablet (10 mg total) by mouth daily. 06/15/21   Lorre Munroe, NP  polyethylene glycol powder (GLYCOLAX/MIRALAX) 17 GM/SCOOP powder Mix 1 capful in a Drink Once daily for Constipation. 10/30/22   Celso Amy, PA-C  pregabalin (LYRICA) 100 MG capsule Take 1-2 capsules (100-200 mg total) by mouth 2 (two) times daily. 10/16/22   Edward Jolly, MD  Suvorexant (BELSOMRA) 5 MG TABS Take 1 tablet (5 mg total) by mouth at bedtime. 10/05/22   Lorre Munroe, NP  traMADol (ULTRAM) 50 MG tablet Take 1 tablet (50 mg total) by mouth daily as needed. 11/22/22   Edward Jolly, MD  traMADol (ULTRAM-ER) 100 MG 24 hr tablet Take 1 tablet (100 mg total) by mouth daily. 11/22/22 02/20/23  Edward Jolly, MD  triamcinolone cream (KENALOG) 0.1 % Apply 1 application topically 2 (two) times daily. 12/12/20   Lorre Munroe,  NP      Allergies    Tizanidine, Lisinopril, and Tramadol    Review of Systems   Review of Systems  Physical Exam Updated Vital Signs BP (!) 170/80 (BP Location: Right Arm)   Pulse 73   Temp 97.9 F (36.6 C)   Resp 18   Ht 4\' 11"  (1.499 m)   Wt 88 kg   SpO2 96%   BMI 39.18 kg/m  Physical Exam Gen: Alert, pleasant woman sittign up in bed comfortably. NAD HEENT: NCAT. MMM. CV: RRR, no murmurs. Resp: CTAB. Normal WOB on RA Abm: Soft, nontender, nondistended. Normal BS.  Msk: Moves all ext spontaneously. Diffusely tender to palpation in LE's, Ue's. BL LE appear symmetric and nonedematous.  Skin: Warm,  well perfused. No rashes noted.   ED Results / Procedures / Treatments   Labs (all labs ordered are listed, but only abnormal results are displayed) Labs Reviewed - No data to display  EKG None  Radiology No results found.  Procedures Procedures   Medications Ordered in ED Medications  ketorolac (TORADOL) 15 MG/ML injection 30 mg (30 mg Intramuscular Given 01/11/23 1832)  morphine (PF) 4 MG/ML injection 8 mg (8 mg Intramuscular Given 01/11/23 1833)    ED Course/ Medical Decision Making/ A&P                               Medical Decision Making:   [name] is a 62 y.o. female who presented to the ED today with generalized pain detailed above.     Complete initial physical exam performed, notably had generalized tenderness.    Reviewed and confirmed nursing documentation for past medical history, family history, social history.    Initial Assessment:    With the patient's presentation of generalized pain, most likely diagnosis is fibromyalgia flair. Other diagnoses considered include myositis, worsening of lumbar spondylosis, viral infection leading to myalgias, diabetic neuropathy. However these are less likely given history and exam findings.   This is most consistent with an acute complicated illness  Initial Plan:  Toradol 30mg  IM Morphine 8mg  IM Objective evaluation as below reviewed   Initial Study Results:   - No new lab studies  Reassessment and Plan:   - Pt VSS and afebrile, making inflammatory processes less likely.  - Given similarity to prior fibromyalgia flairs and improvement of symptoms w/ toradol and morphine, this is most c/w fibromyalgia flair. Pt was discharged and advised to f/u with PCP for further management.      Final Clinical Impression(s) / ED Diagnoses Final diagnoses:  Generalized pain    Rx / DC Orders ED Discharge Orders     None         Lincoln Brigham, MD 01/11/23 Prentice Docker    Gwyneth Sprout, MD 01/17/23 [phone]

## 2023-01-11 NOTE — ED Triage Notes (Signed)
Pt has history of fibromyalgia. Has had a flair up for 1 week. Pain is worse in lower back. This pain feels worse than it has in the past. Pt has sensation of bugs crawling under skin. Ambilatory to triage

## 2023-01-11 NOTE — Discharge Instructions (Addendum)
Your symptoms improved after pain medications. Please follow-up with your PCP in the next week for further management of your fibromyalgia.

## 2023-01-11 NOTE — ED Notes (Signed)
Discharge instructions discussed with pt. Pt verbalized understanding. Pt stable and ambulatory.  °

## 2023-01-22 ENCOUNTER — Telehealth: Payer: Self-pay | Admitting: *Deleted

## 2023-01-22 NOTE — Telephone Encounter (Signed)
Transition Care Management Unsuccessful Follow-up Telephone Call  Date of discharge and from where:  [facility]  01/11/2023  Attempts:  1st Attempt  Reason for unsuccessful TCM follow-up call:  No answer/busy

## 2023-01-23 ENCOUNTER — Telehealth: Payer: Self-pay | Admitting: *Deleted

## 2023-01-23 NOTE — Telephone Encounter (Signed)
Transition Care Management Unsuccessful Follow-up Telephone Call  Date of discharge and from where:  [facility]  01/11/2023  Attempts:  2nd Attempt  Reason for unsuccessful TCM follow-up call:  No answer/busy

## 2023-01-28 DIAGNOSIS — M25572 Pain in left ankle and joints of left foot: Secondary | ICD-10-CM | POA: Diagnosis not present

## 2023-01-28 DIAGNOSIS — D2372 Other benign neoplasm of skin of left lower limb, including hip: Secondary | ICD-10-CM | POA: Diagnosis not present

## 2023-01-28 DIAGNOSIS — D2371 Other benign neoplasm of skin of right lower limb, including hip: Secondary | ICD-10-CM | POA: Diagnosis not present

## 2023-01-28 DIAGNOSIS — M25571 Pain in right ankle and joints of right foot: Secondary | ICD-10-CM | POA: Diagnosis not present

## 2023-01-28 DIAGNOSIS — D2121 Benign neoplasm of connective and other soft tissue of right lower limb, including hip: Secondary | ICD-10-CM | POA: Diagnosis not present

## 2023-01-28 DIAGNOSIS — D2122 Benign neoplasm of connective and other soft tissue of left lower limb, including hip: Secondary | ICD-10-CM | POA: Diagnosis not present

## 2023-01-28 DIAGNOSIS — G8929 Other chronic pain: Secondary | ICD-10-CM | POA: Diagnosis not present

## 2023-02-14 ENCOUNTER — Other Ambulatory Visit: Payer: Self-pay | Admitting: Student in an Organized Health Care Education/Training Program

## 2023-02-14 DIAGNOSIS — M797 Fibromyalgia: Secondary | ICD-10-CM

## 2023-02-14 DIAGNOSIS — M7918 Myalgia, other site: Secondary | ICD-10-CM

## 2023-02-14 DIAGNOSIS — G894 Chronic pain syndrome: Secondary | ICD-10-CM

## 2023-02-21 ENCOUNTER — Encounter: Payer: Self-pay | Admitting: Student in an Organized Health Care Education/Training Program

## 2023-02-21 ENCOUNTER — Ambulatory Visit
Payer: 59 | Attending: Student in an Organized Health Care Education/Training Program | Admitting: Student in an Organized Health Care Education/Training Program

## 2023-02-21 VITALS — BP 151/95 | HR 73 | Temp 99.0°F | Resp 18 | Ht 59.0 in | Wt 193.0 lb

## 2023-02-21 DIAGNOSIS — G894 Chronic pain syndrome: Secondary | ICD-10-CM

## 2023-02-21 DIAGNOSIS — M47816 Spondylosis without myelopathy or radiculopathy, lumbar region: Secondary | ICD-10-CM

## 2023-02-21 DIAGNOSIS — M47817 Spondylosis without myelopathy or radiculopathy, lumbosacral region: Secondary | ICD-10-CM

## 2023-02-21 DIAGNOSIS — M797 Fibromyalgia: Secondary | ICD-10-CM

## 2023-02-21 DIAGNOSIS — M545 Low back pain, unspecified: Secondary | ICD-10-CM | POA: Insufficient documentation

## 2023-02-21 DIAGNOSIS — G8929 Other chronic pain: Secondary | ICD-10-CM | POA: Insufficient documentation

## 2023-02-21 MED ORDER — BACLOFEN 10 MG PO TABS
10.0000 mg | ORAL_TABLET | Freq: Four times a day (QID) | ORAL | 5 refills | Status: DC | PRN
Start: 2023-02-21 — End: 2023-11-14

## 2023-02-21 MED ORDER — TRAMADOL HCL ER 100 MG PO TB24: 100.0000 mg | ORAL_TABLET | Freq: Every day | ORAL | 2 refills | Status: DC

## 2023-02-21 NOTE — Progress Notes (Signed)
PROVIDER NOTE: Information contained herein reflects review and annotations entered in association with encounter. Interpretation of such information and data should be left to medically-trained personnel. Information provided to patient can be located elsewhere in the medical record under "Patient Instructions". Document created using STT-dictation technology, any transcriptional errors that may result from process are unintentional.    Patient: Kristin Porter  Service Category: E/M  Provider: Edward Jolly, MD  DOB: May 18, 1961  DOS: 02/21/2023  Referring Provider: Lorre Munroe, NP  MRN: [id number]  Specialty: Interventional Pain Management  PCP: Lorre Munroe, NP  Type: Established Patient  Setting: Ambulatory outpatient    Location: Office  Delivery: Face-to-face     HPI  [name], a 62 y.o. year old female, is here today because of her Lumbosacral spondylosis without myelopathy [M47.817]. [name] primary complain today is Back Pain  Pertinent problems: [name] on their pertinent problem list. Pain Assessment: Severity of Chronic pain is reported as a 7 /10. Location: Back Lower, Right, Left/Kristin Porter to hips and pelvic area. Sometimes down the legs. Onset: More than a month ago. Quality: Constant, Sore. Timing: Constant. Modifying factor(s): Medications only help some. Vitals:  height is 4\' 11"  (1.499 m) and weight is 193 lb (87.5 kg). Her temporal temperature is 99 F (37.2 C). Her blood pressure is 151/95 (abnormal) and her pulse is 73. Her respiration is 18 and oxygen saturation is 99%.  BMI: Estimated body mass index is 38.98 kg/m as calculated from the following:   Height as of this encounter: 4\' 11"  (1.499 m).   Weight as of this encounter: 193 lb (87.5 kg). Last encounter: 10/16/2022. Last procedure: Visit date not found.  Reason for encounter: medication  management.  Patient presents today for medication management.  No significant change in her medical history.  She has an upcoming surgery with orthopedics for excision of lipoma of right ankle.  She states that she finds better benefit with tramadol compared to [facility] patch.  She is also having focal low back pain.  We discussed the application of Voltaren gel to this area.  Pharmacotherapy Assessment  Analgesic: Tramadol 100 mg ER   Monitoring: [place] PMP: PDMP reviewed during this encounter.       Pharmacotherapy: No side-effects or adverse reactions reported. Compliance: No problems identified. Effectiveness: Clinically acceptable.  Kristin Iba, RN  02/21/2023  8:17 AM  Sign when Signing Visit Nursing Pain Medication Assessment:  Safety precautions to be maintained throughout the outpatient stay will include: orient to surroundings, keep bed in low position, maintain call bell within reach at all times, provide assistance with transfer out of bed and ambulation.   Medication Inspection Compliance: [name] did not comply with our request to bring her pills to be counted. She was reminded that bringing the medication bottles, even when empty, is a requirement.  Medication: None brought in. Pill/Patch Count: None available to be counted. Bottle Appearance: No container available. Did not bring bottle(s) to appointment. Filled Date: N/A Last Medication intake:  Ran out 1 week ago of Tramadol. Patient states unable to pick up Baclofen needs prior authorization.  No results found for: "CBDTHCR" No results found for: "D8THCCBX" No results found for: "D9THCCBX"  UDS:  Summary  Date Value Ref Range Status  10/16/2022 Note  Final    Comment:    ==================================================================== [employer] [facility] (MW) ==================================================================== Test  Result       Flag       Units  Drug Absent but  Declared for Prescription Verification   Tramadol                       Not Detected UNEXPECTED ng/mg creat   Butalbital                     Not Detected UNEXPECTED ==================================================================== Test                      Result    Flag   Units      Ref Range   Creatinine              52               mg/dL      >=16 ==================================================================== Declared Medications:  The flagging and interpretation on this report are based on the  following declared medications.  Unexpected results may arise from  inaccuracies in the declared medications.   **Note: The testing scope of this panel includes these medications:   Butalbital (Fioricet)  Tramadol (Ultram)   **Note: The testing scope of this panel does not include the  following reported medications:   Acetaminophen (Fioricet)  Albuterol  Aspirin  Baclofen (Lioresal)  Caffeine (Fioricet)  Fluticasone (Flonase)  Ipratropium (Atrovent)  Meloxicam (Mobic)  Omeprazole (Prilosec)  Paroxetine (Paxil)  Pregabalin (Lyrica)  Suvorexant (Belsomra)  Triamcinolone (Kenalog) ==================================================================== For clinical consultation, please call [phone]. ====================================================================       ROS  Constitutional: Denies any fever or chills Gastrointestinal: No reported hemesis, hematochezia, vomiting, or acute GI distress Musculoskeletal: Denies any acute onset joint swelling, redness, loss of ROM, or weakness Neurological: No reported episodes of acute onset apraxia, aphasia, dysarthria, agnosia, amnesia, paralysis, loss of coordination, or loss of consciousness  Medication Review  Blood Pressure Kit, PARoxetine, Suvorexant, albuterol, aspirin EC, baclofen, butalbital-acetaminophen-caffeine, fluticasone, ipratropium, meloxicam, pantoprazole, polyethylene glycol powder, pregabalin,  traMADol, and triamcinolone cream  History Review  Allergy: Kristin Porter is allergic to tizanidine, lisinopril, and tramadol. Drug: Kristin Porter  reports no history of drug use. Alcohol:  reports that she does not currently use alcohol. Tobacco:  reports that she quit smoking about 54 years ago. Her smoking use included cigarettes. She has been exposed to tobacco smoke. She has never used smokeless tobacco. Social: Ms. Sudler  reports that she quit smoking about 54 years ago. Her smoking use included cigarettes. She has been exposed to tobacco smoke. She has never used smokeless tobacco. She reports that she does not currently use alcohol. She reports that she does not use drugs. Medical:  has a past medical history of Arthritis, Porter, Hypertension, and Neuropathy. Surgical: Ms. Hendriks  has a past surgical history that includes Breast biopsy (Right, 2018); Esophagogastroduodenoscopy (egd) with propofol (N/A, 02/22/2021); Breast biopsy (Left, 07/12/2021); Replacement total knee (Left); and Cesarean section. Family: Family history is unknown by patient.  Laboratory Chemistry Profile   Renal Lab Results  Component Value Date   BUN 13 10/05/2022   CREATININE 0.64 10/05/2022   BCR SEE NOTE: 10/05/2022   GFRAA 107 03/22/2020   GFRNONAA 92 03/22/2020    Hepatic Lab Results  Component Value Date   AST 16 10/05/2022   ALT 21 10/05/2022    Electrolytes Lab Results  Component Value Date   NA 141 10/05/2022   K 4.4 10/05/2022   CL 106  10/05/2022   CALCIUM 9.0 10/05/2022    Bone No results found for: "VD25OH", "VD125OH2TOT", "XL2440NU2", "VO5366YQ0", "25OHVITD1", "25OHVITD2", "25OHVITD3", "TESTOFREE", "TESTOSTERONE"  Inflammation (CRP: Acute Phase) (ESR: Chronic Phase) Lab Results  Component Value Date   CRP 15.2 (H) 12/12/2020   ESRSEDRATE 34 (H) 12/12/2020         Note: Above Lab results reviewed.  Recent Imaging Review  DG ESOPHAGUS W DOUBLE CM (HD) CLINICAL DATA:   Patient with complaints of the sensation of regurgitation even when not eating or drinking.  EXAM: ESOPHAGUS/BARIUM SWALLOW/TABLET STUDY  TECHNIQUE: Combined double and single contrast examination was performed using effervescent crystals, high-density barium, and thin liquid barium. This exam was performed by Alwyn Ren, NP, and was supervised and interpreted by Dr. Agustin Cree.  FLUOROSCOPY: Radiation Exposure Index (as provided by the fluoroscopic device): 35.10 mGy Kerma  COMPARISON:  Chest radiograph dated 12/23/2018  FINDINGS: Swallowing: Appears normal. No vestibular penetration or aspiration seen.  Pharynx: Unremarkable.  Esophagus: Normal appearance.  Esophageal motility: Occasional mild tertiary contractions. Retrograde motion of small volume contrast demonstrated.  Hiatal Hernia: None.  Gastroesophageal reflux: None visualized.  Ingested 13mm barium tablet: No obstruction to the passage of a 13 mm tablet.  Other: None.  IMPRESSION: 1. Esophageal dysmotility demonstrated by tertiary contractions and intermittent retrograde motion of esophageal contrast. 2. No frank gastroesophageal reflux demonstrated.  Electronically Signed   By: Agustin Cree M.D.   On: 11/05/2022 10:47 Note: Reviewed        Physical Exam  General appearance: Well nourished, well developed, and well hydrated. In no apparent acute distress Mental status: Alert, oriented x 3 (person, place, & time)       Respiratory: No evidence of acute respiratory distress Eyes: PERLA Vitals: BP (!) 151/95   Pulse 73   Temp 99 F (37.2 C) (Temporal)   Resp 18   Ht 4\' 11"  (1.499 m)   Wt 193 lb (87.5 kg)   SpO2 99%   BMI 38.98 kg/m  BMI: Estimated body mass index is 38.98 kg/m as calculated from the following:   Height as of this encounter: 4\' 11"  (1.499 m).   Weight as of this encounter: 193 lb (87.5 kg). Ideal: Patient must be at least 60 in tall to calculate ideal body weight  Assessment    Diagnosis Status  1. Lumbosacral spondylosis without myelopathy   2. Lumbar facet joint Porter   3. Chronic bilateral low back pain without sciatica   4. Porter   5. Chronic pain Porter    Controlled Controlled Controlled   Updated Problems: No problems updated.  Plan of Care  Problem-specific:  No problem-specific Assessment & Plan notes found for this encounter.  Ms. CHARLICIA BLACKHAM has a current medication list which includes the following long-term medication(s): albuterol, fluticasone, ipratropium, paroxetine, pregabalin, and pantoprazole.  Pharmacotherapy (Medications Ordered): Meds ordered this encounter  Medications   baclofen (LIORESAL) 10 MG tablet    Sig: Take 1 tablet (10 mg total) by mouth 4 (four) times daily as needed for muscle spasms.    Dispense:  120 each    Refill:  5   traMADol (ULTRAM-ER) 100 MG 24 hr tablet    Sig: Take 1 tablet (100 mg total) by mouth daily.    Dispense:  30 tablet    Refill:  2   Orders:  No orders of the defined types were placed in this encounter.  Follow-up plan:   Return in about 3 months (around 05/24/2023) for  MM, F2F.      Recent Visits No visits were found meeting these conditions. Showing recent visits within past 90 days and meeting all other requirements Today's Visits Date Type Provider Dept  02/21/23 Office Visit Edward Jolly, MD [facility]  Showing today's visits and meeting all other requirements Future Appointments No visits were found meeting these conditions. Showing future appointments within next 90 days and meeting all other requirements  I discussed the assessment and treatment plan with the patient. The patient was provided an opportunity to ask questions and all were answered. The patient agreed with the plan and demonstrated an understanding of the instructions.  Patient advised to call back or seek an in-person evaluation if the symptoms or condition worsens.  Duration  of encounter: .  Total time on encounter, as per AMA guidelines included both the face-to-face and non-face-to-face time personally spent by the physician and/or other qualified health care professional(s) on the day of the encounter (includes time in activities that require the physician or other qualified health care professional and does not include time in activities normally performed by clinical staff). Physician's time may include the following activities when performed: Preparing to see the patient (e.g., pre-charting review of records, searching for previously ordered imaging, lab work, and nerve conduction tests) Review of prior analgesic pharmacotherapies. Reviewing PMP Interpreting ordered tests (e.g., lab work, imaging, nerve conduction tests) Performing post-procedure evaluations, including interpretation of diagnostic procedures Obtaining and/or reviewing separately obtained history Performing a medically appropriate examination and/or evaluation Counseling and educating the patient/family/caregiver Ordering medications, tests, or procedures Referring and communicating with other health care professionals (when not separately reported) Documenting clinical information in the electronic or other health record Independently interpreting results (not separately reported) and communicating results to the patient/ family/caregiver Care coordination (not separately reported)  Note by: Edward Jolly, MD Date: 02/21/2023; Time: 8:53 AM

## 2023-02-21 NOTE — Progress Notes (Signed)
Nursing Pain Medication Assessment:  Safety precautions to be maintained throughout the outpatient stay will include: orient to surroundings, keep bed in low position, maintain call bell within reach at all times, provide assistance with transfer out of bed and ambulation.   Medication Inspection Compliance: Kristin Porter did not comply with our request to bring her pills to be counted. She was reminded that bringing the medication bottles, even when empty, is a requirement.  Medication: None brought in. Pill/Patch Count: None available to be counted. Bottle Appearance: No container available. Did not bring bottle(s) to appointment. Filled Date: N/A Last Medication intake:  Ran out 1 week ago of Tramadol. Patient states unable to pick up Baclofen needs prior authorization.

## 2023-02-21 NOTE — Patient Instructions (Signed)
1.Pick up voltaren gel OTC and apply to focal, painful areas

## 2023-02-26 ENCOUNTER — Encounter
Admission: RE | Admit: 2023-02-26 | Discharge: 2023-02-26 | Disposition: A | Discharge status: Home / Self Care | Payer: 59 | Source: Ambulatory Visit | Attending: Surgery | Admitting: Surgery

## 2023-02-26 ENCOUNTER — Other Ambulatory Visit: Payer: Self-pay

## 2023-02-26 DIAGNOSIS — I10 Essential (primary) hypertension: Secondary | ICD-10-CM | POA: Diagnosis not present

## 2023-02-26 DIAGNOSIS — Z01812 Encounter for preprocedural laboratory examination: Secondary | ICD-10-CM | POA: Insufficient documentation

## 2023-02-26 DIAGNOSIS — Z01818 Encounter for other preprocedural examination: Secondary | ICD-10-CM | POA: Diagnosis present

## 2023-02-26 DIAGNOSIS — Z01810 Encounter for preprocedural cardiovascular examination: Secondary | ICD-10-CM | POA: Insufficient documentation

## 2023-02-26 HISTORY — DX: Gastro-esophageal reflux disease without esophagitis: K21.9

## 2023-02-26 HISTORY — DX: Other benign neoplasm of skin of right lower limb, including hip: D23.71

## 2023-02-26 HISTORY — DX: Spondylosis without myelopathy or radiculopathy, lumbosacral region: M47.817

## 2023-02-26 HISTORY — DX: Benign neoplasm of connective and other soft tissue of unspecified lower limb, including hip: D21.20

## 2023-02-26 HISTORY — DX: Chronic pain syndrome: G89.4

## 2023-02-26 HISTORY — DX: Headache, unspecified: R51.9

## 2023-02-26 LAB — BASIC METABOLIC PANEL
Anion gap: 8 (ref 5–15)
BUN: 10 mg/dL (ref 8–23)
CO2: 26 mmol/L (ref 22–32)
Calcium: 8.8 mg/dL — ABNORMAL LOW (ref 8.9–10.3)
Chloride: 103 mmol/L (ref 98–111)
Creatinine, Ser: 0.61 mg/dL (ref 0.44–1.00)
GFR, Estimated: >60 mL/min (ref >60)
Glucose, Bld: 97 mg/dL (ref 70–99)
Potassium: 4.1 mmol/L (ref 3.5–5.1)
Sodium: 137 mmol/L (ref 135–145)

## 2023-02-26 LAB — CBC
HCT: 41.3 % (ref 36.0–46.0)
Hemoglobin: 14 g/dL (ref 12.0–15.0)
MCH: 29.7 pg (ref 26.0–34.0)
MCHC: 33.9 g/dL (ref 30.0–36.0)
MCV: 87.5 fL (ref 80.0–100.0)
Platelets: 290 10*3/uL (ref 150–400)
RBC: 4.72 MIL/uL (ref 3.87–5.11)
RDW: 13.1 % (ref 11.5–15.5)
WBC: 6.5 10*3/uL (ref 4.0–10.5)
nRBC: 0 % (ref 0.0–0.2)

## 2023-02-26 NOTE — Patient Instructions (Addendum)
Your procedure is scheduled on: 03/05/23 - Tuesday Report to the Registration Desk on the 1st floor of the [facility]. To find out your arrival time, please call [phone] between 1PM - 3PM on: 03/04/23 - Monday If your arrival time is 6:00 am, do not arrive before that time as the [facility] entrance doors do not open until 6:00 am.  REMEMBER: Instructions that are not followed completely may result in serious medical risk, up to and including death; or upon the discretion of your surgeon and anesthesiologist your surgery may need to be rescheduled.  Do not eat food after midnight the night before surgery.  No gum chewing or hard candies.  You may however, drink CLEAR liquids up to 2 hours before you are scheduled to arrive for your surgery. Do not drink anything within 2 hours of your scheduled arrival time.  Clear liquids include: - water  - apple juice without pulp - gatorade (not RED colors) - black coffee or tea (Do NOT add milk or creamers to the coffee or tea) Do NOT drink anything that is not on this list.   One week prior to surgery: Stop Anti-inflammatories (NSAIDS) such as Advil, Aleve, Ibuprofen, Motrin, Naproxen, Naprosyn and Aspirin based products such as Excedrin, Goody's Powder, BC Powder.  Stop ANY OVER THE COUNTER supplements until after surgery.  You may however, continue to take Tylenol if needed for pain up until the day of surgery.  Continue taking all prescribed medications with the exception of the following:  Stop meloxicam (MOBIC) beginning 02/26/23   TAKE ONLY THESE MEDICATIONS THE MORNING OF SURGERY WITH A SIP OF WATER:  pantoprazole (PROTONIX) - (take one the night before and one on the morning of surgery - helps to prevent nausea after surgery.) 2.   pregabalin (LYRICA)  3.   traMADol (ULTRAM-ER)    No Alcohol for 24 hours before or after surgery.  No Smoking including e-cigarettes for 24 hours before surgery.  No chewable tobacco  products for at least 6 hours before surgery.  No nicotine patches on the day of surgery.  Do not use any "recreational" drugs for at least a week (preferably 2 weeks) before your surgery.  Please be advised that the combination of cocaine and anesthesia may have negative outcomes, up to and including death. If you test positive for cocaine, your surgery will be cancelled.  On the morning of surgery brush your teeth with toothpaste and water, you may rinse your mouth with mouthwash if you wish. Do not swallow any toothpaste or mouthwash.  Use CHG Soap or wipes as directed on instruction sheet.  Do not wear jewelry, make-up, hairpins, clips or nail polish.  For welded (permanent) jewelry: bracelets, anklets, waist bands, etc.  Please have this removed prior to surgery.  If it is not removed, there is a chance that hospital personnel will need to cut it off on the day of surgery.  Do not wear lotions, powders, or perfumes.   Do not shave body hair from the neck down 48 hours before surgery.  Contact lenses, hearing aids and dentures may not be worn into surgery.  Do not bring valuables to the hospital. [facility] is not responsible for any missing/lost belongings or valuables.   Notify your doctor if there is any change in your medical condition (cold, fever, infection).  Wear comfortable clothing (specific to your surgery type) to the hospital.  After surgery, you can help prevent lung complications by doing breathing exercises.  Take deep breaths and cough every 1-2 hours. Your doctor may order a device called an Incentive Spirometer to help you take deep breaths. When coughing or sneezing, hold a pillow firmly against your incision with both hands. This is called "splinting." Doing this helps protect your incision. It also decreases belly discomfort.  If you are being admitted to the hospital overnight, leave your suitcase in the car. After surgery it may be brought to your  room.  In case of increased patient census, it may be necessary for you, the patient, to continue your postoperative care in the Same Day Surgery department.  If you are being discharged the day of surgery, you will not be allowed to drive home. You will need a responsible individual to drive you home and stay with you for 24 hours after surgery.   If you are taking public transportation, you will need to have a responsible individual with you.  Please call the Pre-admissions Testing Dept. at [phone] if you have any questions about these instructions.  Surgery Visitation Policy:  Patients having surgery or a procedure may have two visitors.  Children under the age of 50 must have an adult with them who is not the patient.  Inpatient Visitation:    Visiting hours are 7 a.m. to 8 p.m. Up to four visitors are allowed at one time in a patient room. The visitors may rotate out with other people during the day.  One visitor age 25 or older may stay with the patient overnight and must be in the room by 8 p.m.     Preparing for Surgery with CHLORHEXIDINE GLUCONATE (CHG) Soap  Chlorhexidine Gluconate (CHG) Soap  o An antiseptic cleaner that kills germs and bonds with the skin to continue killing germs even after washing  o Used for showering the night before surgery and morning of surgery  Before surgery, you can play an important role by reducing the number of germs on your skin.  CHG (Chlorhexidine gluconate) soap is an antiseptic cleanser which kills germs and bonds with the skin to continue killing germs even after washing.  Please do not use if you have an allergy to CHG or antibacterial soaps. If your skin becomes reddened/irritated stop using the CHG.  1. Shower the NIGHT BEFORE SURGERY and the MORNING OF SURGERY with CHG soap.  2. If you choose to wash your hair, wash your hair first as usual with your normal shampoo.  3. After shampooing, rinse your hair and body  thoroughly to remove the shampoo.  4. Use CHG as you would any other liquid soap. You can apply CHG directly to the skin and wash gently with a scrungie or a clean washcloth.  5. Apply the CHG soap to your body only from the neck down. Do not use on open wounds or open sores. Avoid contact with your eyes, ears, mouth, and genitals (private parts). Wash face and genitals (private parts) with your normal soap.  6. Wash thoroughly, paying special attention to the area where your surgery will be performed.  7. Thoroughly rinse your body with warm water.  8. Do not shower/wash with your normal soap after using and rinsing off the CHG soap.  9. Pat yourself dry with a clean towel.  10. Wear clean pajamas to bed the night before surgery.  12. Place clean sheets on your bed the night of your first shower and do not sleep with pets.  13. Shower again with the CHG soap on  the day of surgery prior to arriving at the hospital.  14. Do not apply any deodorants/lotions/powders.  15. Please wear clean clothes to the hospital.

## 2023-02-27 ENCOUNTER — Other Ambulatory Visit: Payer: Self-pay | Admitting: Surgery

## 2023-02-28 DIAGNOSIS — I700 Atherosclerosis of aorta: Secondary | ICD-10-CM | POA: Diagnosis not present

## 2023-02-28 DIAGNOSIS — D2371 Other benign neoplasm of skin of right lower limb, including hip: Secondary | ICD-10-CM | POA: Diagnosis not present

## 2023-03-05 ENCOUNTER — Encounter: Payer: Self-pay | Admitting: Surgery

## 2023-03-05 ENCOUNTER — Ambulatory Visit: Payer: 59 | Admitting: Urgent Care

## 2023-03-05 ENCOUNTER — Encounter
Admission: RE | Disposition: A | Discharge status: Home / Self Care | Payer: Self-pay | Source: Home / Self Care | Attending: Surgery

## 2023-03-05 ENCOUNTER — Other Ambulatory Visit: Payer: Self-pay

## 2023-03-05 ENCOUNTER — Ambulatory Visit
Admission: RE | Admit: 2023-03-05 | Discharge: 2023-03-05 | Disposition: A | Discharge status: Home / Self Care | Payer: 59 | Attending: Surgery | Admitting: Surgery

## 2023-03-05 ENCOUNTER — Ambulatory Visit: Payer: 59 | Admitting: Anesthesiology

## 2023-03-05 DIAGNOSIS — G629 Polyneuropathy, unspecified: Secondary | ICD-10-CM | POA: Diagnosis not present

## 2023-03-05 DIAGNOSIS — J302 Other seasonal allergic rhinitis: Secondary | ICD-10-CM

## 2023-03-05 DIAGNOSIS — D2371 Other benign neoplasm of skin of right lower limb, including hip: Secondary | ICD-10-CM | POA: Diagnosis not present

## 2023-03-05 HISTORY — PX: MASS EXCISION: SHX2000

## 2023-03-05 SURGERY — EXCISION MASS
Anesthesia: General | Site: Ankle | Laterality: Right

## 2023-03-05 MED ORDER — PHENYLEPHRINE 80 MCG/ML (10ML) SYRINGE FOR IV PUSH (FOR BLOOD PRESSURE SUPPORT)
PREFILLED_SYRINGE | INTRAVENOUS | Status: AC
  Filled 2023-03-05: qty 10

## 2023-03-05 MED ORDER — MIDAZOLAM HCL 2 MG/2ML IJ SOLN
INTRAMUSCULAR | Status: AC
  Filled 2023-03-05: qty 2

## 2023-03-05 MED ORDER — PROPOFOL 10 MG/ML IV BOLUS
INTRAVENOUS | Status: DC | PRN
Start: 2023-03-05 — End: 2023-03-05
  Administered 2023-03-05: 40 mg via INTRAVENOUS

## 2023-03-05 MED ORDER — FENTANYL CITRATE (PF) 100 MCG/2ML IJ SOLN
INTRAMUSCULAR | Status: DC | PRN
Start: 2023-03-05 — End: 2023-03-05
  Administered 2023-03-05: 50 ug via INTRAVENOUS

## 2023-03-05 MED ORDER — LIDOCAINE HCL (PF) 2 % IJ SOLN
INTRAMUSCULAR | Status: AC
  Filled 2023-03-05: qty 5

## 2023-03-05 MED ORDER — LACTATED RINGERS IV SOLN: INTRAVENOUS | Status: DC

## 2023-03-05 MED ORDER — ONDANSETRON HCL 4 MG/2ML IJ SOLN: 4.0000 mg | Freq: Once | INTRAMUSCULAR | Status: DC | PRN

## 2023-03-05 MED ORDER — OXYCODONE HCL 5 MG PO TABS: 5.0000 mg | ORAL_TABLET | Freq: Once | ORAL | Status: DC | PRN

## 2023-03-05 MED ORDER — LIDOCAINE HCL (PF) 1 % IJ SOLN
INTRAMUSCULAR | Status: DC | PRN
Start: 2023-03-05 — End: 2023-03-05
  Administered 2023-03-05: 60 mg

## 2023-03-05 MED ORDER — 0.9 % SODIUM CHLORIDE (POUR BTL) OPTIME
TOPICAL | Status: DC | PRN
  Administered 2023-03-05: 500 mL

## 2023-03-05 MED ORDER — MIDAZOLAM HCL 2 MG/2ML IJ SOLN
INTRAMUSCULAR | Status: DC | PRN
Start: 2023-03-05 — End: 2023-03-05
  Administered 2023-03-05: 2 mg via INTRAVENOUS

## 2023-03-05 MED ORDER — CEFAZOLIN SODIUM-DEXTROSE 2-4 GM/100ML-% IV SOLN: 2.0000 g | INTRAVENOUS | Status: DC

## 2023-03-05 MED ORDER — PROPOFOL 1000 MG/100ML IV EMUL
INTRAVENOUS | Status: AC
  Filled 2023-03-05: qty 100

## 2023-03-05 MED ORDER — CEFAZOLIN SODIUM-DEXTROSE 2-4 GM/100ML-% IV SOLN
2.0000 g | INTRAVENOUS | Status: AC
  Administered 2023-03-05: 2 g via INTRAVENOUS

## 2023-03-05 MED ORDER — FENTANYL CITRATE (PF) 100 MCG/2ML IJ SOLN
INTRAMUSCULAR | Status: AC
  Filled 2023-03-05: qty 2

## 2023-03-05 MED ORDER — CHLORHEXIDINE GLUCONATE 0.12 % MT SOLN
OROMUCOSAL | Status: AC
  Filled 2023-03-05: qty 15

## 2023-03-05 MED ORDER — PROPOFOL 500 MG/50ML IV EMUL
INTRAVENOUS | Status: DC | PRN
Start: 2023-03-05 — End: 2023-03-05
  Administered 2023-03-05: 100 ug/kg/min via INTRAVENOUS

## 2023-03-05 MED ORDER — ACETAMINOPHEN 10 MG/ML IV SOLN: 1000.0000 mg | Freq: Once | INTRAVENOUS | Status: DC | PRN

## 2023-03-05 MED ORDER — ONDANSETRON HCL 4 MG/2ML IJ SOLN
INTRAMUSCULAR | Status: AC
  Filled 2023-03-05: qty 2

## 2023-03-05 MED ORDER — CHLORHEXIDINE GLUCONATE 0.12 % MT SOLN
15.0000 mL | Freq: Once | OROMUCOSAL | Status: AC
  Administered 2023-03-05: 15 mL via OROMUCOSAL

## 2023-03-05 MED ORDER — SEVOFLURANE IN SOLN
RESPIRATORY_TRACT | Status: AC
  Filled 2023-03-05: qty 250

## 2023-03-05 MED ORDER — BUPIVACAINE HCL (PF) 0.5 % IJ SOLN
INTRAMUSCULAR | Status: DC | PRN
  Administered 2023-03-05: 10 mL

## 2023-03-05 MED ORDER — CEFAZOLIN SODIUM-DEXTROSE 2-4 GM/100ML-% IV SOLN
INTRAVENOUS | Status: AC
  Filled 2023-03-05: qty 100

## 2023-03-05 MED ORDER — BUPIVACAINE HCL (PF) 0.5 % IJ SOLN
INTRAMUSCULAR | Status: AC
  Filled 2023-03-05: qty 30

## 2023-03-05 MED ORDER — ONDANSETRON HCL 4 MG/2ML IJ SOLN
INTRAMUSCULAR | Status: DC | PRN
Start: 2023-03-05 — End: 2023-03-05
  Administered 2023-03-05: 4 mg via INTRAVENOUS

## 2023-03-05 MED ORDER — ORAL CARE MOUTH RINSE: 15.0000 mL | Freq: Once | OROMUCOSAL | Status: AC

## 2023-03-05 MED ORDER — KETOROLAC TROMETHAMINE 15 MG/ML IJ SOLN
15.0000 mg | Freq: Once | INTRAMUSCULAR | Status: AC
  Administered 2023-03-05: 15 mg via INTRAVENOUS

## 2023-03-05 MED ORDER — PROPOFOL 10 MG/ML IV BOLUS
INTRAVENOUS | Status: AC
  Filled 2023-03-05: qty 20

## 2023-03-05 MED ORDER — FENTANYL CITRATE (PF) 100 MCG/2ML IJ SOLN: 25.0000 ug | INTRAMUSCULAR | Status: DC | PRN

## 2023-03-05 MED ORDER — KETOROLAC TROMETHAMINE 15 MG/ML IJ SOLN
INTRAMUSCULAR | Status: AC
  Filled 2023-03-05: qty 1

## 2023-03-05 MED ORDER — OXYCODONE HCL 5 MG/5ML PO SOLN: 5.0000 mg | Freq: Once | ORAL | Status: DC | PRN

## 2023-03-05 SURGICAL SUPPLY — 35 items
APL PRP STRL LF DISP 70% ISPRP (MISCELLANEOUS) ×1
APL SKNCLS STERI-STRIP NONHPOA (GAUZE/BANDAGES/DRESSINGS) ×1
BENZOIN TINCTURE PRP APPL 2/3 (GAUZE/BANDAGES/DRESSINGS) IMPLANT
BNDG CMPR STD VLCR NS LF 5.8X2 (GAUZE/BANDAGES/DRESSINGS)
BNDG CMPR STD VLCR NS LF 5.8X3 (GAUZE/BANDAGES/DRESSINGS)
BNDG ELASTIC 2X5.8 VLCR NS LF (GAUZE/BANDAGES/DRESSINGS) IMPLANT
BNDG ELASTIC 3X5.8 VLCR NS LF (GAUZE/BANDAGES/DRESSINGS) IMPLANT
BNDG ESMARCH 4 X 12 STRL LF (GAUZE/BANDAGES/DRESSINGS) ×1
BNDG ESMARCH 4X12 STRL LF (GAUZE/BANDAGES/DRESSINGS) ×1 IMPLANT
CHLORAPREP W/TINT 26 (MISCELLANEOUS) ×1 IMPLANT
CORD BIP STRL DISP 12FT (MISCELLANEOUS) ×1 IMPLANT
CUFF TOURN SGL QUICK 18X4 (TOURNIQUET CUFF) IMPLANT
DRSG OPSITE POSTOP 3X4 (GAUZE/BANDAGES/DRESSINGS) IMPLANT
GAUZE SPONGE 4X4 12PLY STRL (GAUZE/BANDAGES/DRESSINGS) ×1 IMPLANT
GAUZE XEROFORM 1X8 LF (GAUZE/BANDAGES/DRESSINGS) ×1 IMPLANT
GLOVE BIO SURGEON STRL SZ8 (GLOVE) ×2 IMPLANT
GLOVE INDICATOR 8.0 STRL GRN (GLOVE) ×1 IMPLANT
GOWN STRL REUS W/ TWL LRG LVL3 (GOWN DISPOSABLE) ×1 IMPLANT
GOWN STRL REUS W/ TWL XL LVL3 (GOWN DISPOSABLE) ×1 IMPLANT
GOWN STRL REUS W/TWL LRG LVL3 (GOWN DISPOSABLE) ×1
GOWN STRL REUS W/TWL XL LVL3 (GOWN DISPOSABLE) ×1
KIT TURNOVER KIT A (KITS) ×1 IMPLANT
MANIFOLD NEPTUNE II (INSTRUMENTS) ×1 IMPLANT
NS IRRIG 500ML POUR BTL (IV SOLUTION) ×1 IMPLANT
PACK EXTREMITY ARMC (MISCELLANEOUS) ×1 IMPLANT
SOL PREP PVP 2OZ (MISCELLANEOUS)
SOLUTION PREP PVP 2OZ (MISCELLANEOUS) ×1 IMPLANT
STOCKINETTE IMPERVIOUS 9X36 MD (GAUZE/BANDAGES/DRESSINGS) ×1 IMPLANT
STRAP SAFETY 5IN WIDE (MISCELLANEOUS) ×1 IMPLANT
STRIP CLOSURE SKIN 1/4X4 (GAUZE/BANDAGES/DRESSINGS) IMPLANT
SUT PROLENE 4 0 PS 2 18 (SUTURE) ×1 IMPLANT
SUT VIC AB 3-0 SH 27 (SUTURE) ×1
SUT VIC AB 3-0 SH 27X BRD (SUTURE) IMPLANT
TRAP FLUID SMOKE EVACUATOR (MISCELLANEOUS) ×1 IMPLANT
WATER STERILE IRR 500ML POUR (IV SOLUTION) ×1 IMPLANT

## 2023-03-05 NOTE — Op Note (Signed)
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  Complications:   None  Fluids:   200 cc crystalloid  EBL:   0 cc  UOP:   None  TT:   15 minutes at 250 mmHg  Drains:   None  Closure:   3-0 Vicryl subcuticular sutures  Brief Clinical Note:   The patient is a 62 year old female with a history of a slowly enlarging and uncomfortable soft tissue mass on the anterolateral aspect of her right ankle.  Her symptoms have persisted despite medications, activity modification, etc.  Her history and examination consistent with an apparently benign neoplasm of the right ankle.  She resents at this time for excision of the benign soft tissue mass.  Procedure:   The patient was brought into the operating room and laid in the supine position.  After adequate IV sedation was achieved, the right foot and lower leg were prepped with ChloraPrep solution before being draped sterilely.  Preoperative antibiotics were administered.  A timeout was performed to verify the appropriate surgical site before the limb was exsanguinated with an Esmarch and the calf tourniquet inflated to 250 mmHg.  A total of 10 cc of 0.5% plain Sensorcaine was injected in around the incision site to help with perioperative analgesia.  An approximately 2.5 cm incision was made transversely over the mass.  The incision was carried down through the subcutaneous tissues to expose the mass.  The mass was excised circumferentially using blunt and sharp dissection before being removed.  It was sent to pathology for further evaluation and treatment.  Hemostasis was achieved using electrocautery.    The wound was irrigated with sterile saline solution before the wound was closed  using 3-0 Vicryl subcuticular sutures.  Benzoin and Steri-Strips were applied to the skin before sterile occlusive dressing was applied over the wound.  The patient was then awakened and returned to the recovery room in satisfactory condition after tolerating the procedure well.

## 2023-03-05 NOTE — Anesthesia Postprocedure Evaluation (Signed)
Anesthesia Post Note  Patient: Kristin Porter  Procedure(s) Performed: EXCISION OF SOFT TISSUE MASS OF RIGHT ANKLE (Right: Ankle)  Patient location during evaluation: PACU Anesthesia Type: General Level of consciousness: awake and alert Pain management: pain level controlled Vital Signs Assessment: post-procedure vital signs reviewed and stable Respiratory status: spontaneous breathing, nonlabored ventilation, respiratory function stable and patient connected to nasal cannula oxygen Cardiovascular status: blood pressure returned to baseline and stable Postop Assessment: no apparent nausea or vomiting Anesthetic complications: no   No notable events documented.   Last Vitals:  Vitals:   03/05/23 1101 03/05/23 1121  BP: 128/86   Pulse: 60 (!) 57  Resp: 14 17  Temp:  (!) 36.1 C  SpO2: 97% 97%    Last Pain:  Vitals:   03/05/23 1121  TempSrc: Temporal  PainSc: 0-No pain                 Corinda Gubler

## 2023-03-05 NOTE — Anesthesia Preprocedure Evaluation (Signed)
Anesthesia Evaluation  Patient identified by MRN, date of birth, ID band Patient awake    Reviewed: Allergy & Precautions, NPO status , Patient's Chart, lab work & pertinent test results  History of Anesthesia Complications Negative for: history of anesthetic complications  Airway Mallampati: III  TM Distance: >3 FB Neck ROM: Full    Dental no notable dental hx. (+) Teeth Intact   Pulmonary neg pulmonary ROS, neg sleep apnea, neg COPD, Patient abstained from smoking.Not current smoker, former smoker   Pulmonary exam normal breath sounds clear to auscultation       Cardiovascular Exercise Tolerance: Good METShypertension, Pt. on medications (-) CAD and (-) Past MI (-) dysrhythmias  Rhythm:Regular Rate:Normal - Systolic murmurs    Neuro/Psych  Headaches  Neuromuscular disease  negative psych ROS   GI/Hepatic ,GERD  Controlled,,(+)     (-) substance abuse    Endo/Other  neg diabetes    Renal/GU negative Renal ROS     Musculoskeletal  (+)  Fibromyalgia -, narcotic dependent  Abdominal  (+) + obese  Peds  Hematology   Anesthesia Other Findings Past Medical History: No date: Arthritis     Comment:  Bilateral hips No date: Benign neoplasm of connective tissue of foot No date: Benign neoplasm of skin of right ankle No date: Chronic pain syndrome No date: Fibromyalgia No date: GERD (gastroesophageal reflux disease) No date: Headache No date: Hypertension No date: Lumbosacral spondylosis No date: Neuropathy  Reproductive/Obstetrics                             Anesthesia Physical Anesthesia Plan  ASA: 2  Anesthesia Plan: General   Post-op Pain Management: Minimal or no pain anticipated   Induction: Intravenous  PONV Risk Score and Plan: 3 and Propofol infusion, TIVA, Ondansetron, Midazolam and Dexamethasone  Airway Management Planned: Nasal Cannula  Additional Equipment:  None  Intra-op Plan:   Post-operative Plan:   Informed Consent: I have reviewed the patients History and Physical, chart, labs and discussed the procedure including the risks, benefits and alternatives for the proposed anesthesia with the patient or authorized representative who has indicated his/her understanding and acceptance.     Dental advisory given  Plan Discussed with: CRNA and Surgeon  Anesthesia Plan Comments: (Discussed risks of anesthesia with patient, including possibility of difficulty with spontaneous ventilation under anesthesia necessitating airway intervention, PONV, and rare risks such as cardiac or respiratory or neurological events, and allergic reactions. Discussed the role of CRNA in patient's perioperative care. Patient understands.)       Anesthesia Quick Evaluation

## 2023-03-05 NOTE — Discharge Instructions (Addendum)
Orthopedic discharge instructions: Keep dressing dry and intact.  May shower after dressing changed on post-op day #4 (Saturday).  Cover staples/sutures with Band-Aids after drying off. Apply ice frequently to ankle. Resume meloxicam 15 mg daily with meals for 3-5 days, then as necessary. Take pain medication as prescribed or ES Tylenol when needed.  May weight-bear as tolerated - use crutches or walker as needed. Follow-up in 10-14 days or as scheduled.  AMBULATORY SURGERY  DISCHARGE INSTRUCTIONS   The drugs that you were given will stay in your system until tomorrow so for the next 24 hours you should not:  Drive an automobile Make any legal decisions Drink any alcoholic beverage   You may resume regular meals tomorrow.  Today it is better to start with liquids and gradually work up to solid foods.  You may eat anything you prefer, but it is better to start with liquids, then soup and crackers, and gradually work up to solid foods.   Please notify your doctor immediately if you have any unusual bleeding, trouble breathing, redness and pain at the surgery site, drainage, fever, or pain not relieved by medication.    Additional Instructions: Please contact your physician with any problems or Same Day Surgery at [phone], Monday through Friday 6 am to 4 pm, or [place] [facility] number at [phone].

## 2023-03-05 NOTE — Transfer of Care (Signed)
Immediate Anesthesia Transfer of Care Note  Patient: Kristin Porter  Procedure(s) Performed: EXCISION OF SOFT TISSUE MASS OF RIGHT ANKLE (Right: Ankle)  Patient Location: PACU  Anesthesia Type:General  Level of Consciousness: drowsy  Airway & Oxygen Therapy: Patient Spontanous Breathing and Patient connected to face mask oxygen  Post-op Assessment: Report given to RN and Post -op Vital signs reviewed and stable  Post vital signs: Reviewed and stable  Last Vitals:  Vitals Value Taken Time  BP 86/58 03/05/23 1026  Temp    Pulse 63 03/05/23 1029  Resp 14 03/05/23 1029  SpO2 98 % 03/05/23 1029  Vitals shown include unfiled device data.  Last Pain:  Vitals:   03/05/23 0850  TempSrc: Temporal  PainSc: 8       Patients Stated Pain Goal: 8 (03/05/23 0850)  Complications: No notable events documented.

## 2023-03-05 NOTE — H&P (Signed)
History of Present Illness:  Kristin Porter is a 62 y.o. female who presents for history and physical for a right ankle soft tissue mass to be excised by Dr. Joice Lofts on March 05, 2023. Patient saw Dr. Joice Lofts for evaluation and treatment of painful soft tissue masses on the anterolateral aspect of both ankles, right more symptomatic than left. The patient notes that this masses have been present for several years and have gradually been increasing in size. When she first noticed them, she was advised by her neurologist, who was treating her for bilateral peripheral neuropathy, that these may gradually increase in size. As they did get bigger, she felt that the masses were becoming more painful so she was referred to a general surgeon, Dr. Claudine Mouton. Dr. Claudine Mouton did not feel comfortable managing this issue, so she was referred to me for further evaluation and treatment. The patient notes that her symptoms are worse with any prolonged standing or ambulation. She rates her pain at 7/10. She has been taking meloxicam and tramadol as necessary with limited benefit. She denies any specific injury to either ankle. She ambulates with a cane for balance and support.  Current Outpatient Medications:  atorvastatin (LIPITOR) 80 MG tablet Take 1 tablet (80 mg total) by mouth once daily 30 tablet 5  baclofen (LIORESAL) 10 MG tablet TAKE 1 TABLET BY MOUTH 4 TIMES DAILY AS NEEDED FOR MUSCLE SPASM  BELSOMRA 5 mg Tab Take 1 tablet by mouth at bedtime  buprenorphine (BUTRANS) 5 mcg/hour PTWK Place 1 patch onto the skin every 7 (seven) days  fluticasone propionate (FLONASE) 50 mcg/actuation nasal spray by Nasal route  folic acid (FOLVITE) 400 MCG tablet Take 400 mcg by mouth once daily  lidocaine (LIDODERM) 5 % patch Place 2 patches onto the skin daily. Apply patch to the most painful area for up to 12 hours in a 24 hour period. Patient takes as needed 60 patch 5  loteprednol (LOTEMAX) 0.2 % DrpS 1 drop 4 (four) times  daily as needed. Patient takes 1 drop in both eyes as needed  meloxicam (MOBIC) 15 MG tablet Take 1 tablet (15 mg total) by mouth once daily as needed for Pain 30 tablet 4  methocarbamol (ROBAXIN) 500 MG tablet Take 500 mg by mouth 3 (three) times daily as needed  naloxone (NARCAN) 4 mg/actuation nasal spray CALL 911. ADMINISTER A SINGLE SPRAY OF NARCAN IN ONE NOSTRIL. REPEAT EVERY 3 MINUTES AS NEEDED IF NO OR MINIMAL RESPONSE.  omeprazole (PRILOSEC) 20 MG DR capsule Take 20 mg by mouth once daily  pregabalin (LYRICA) 100 MG capsule Take 100 mg by mouth once daily  PROAIR HFA 90 mcg/actuation inhaler INHALE 1 PUFF BY MOUTH EVERY 4 TO 6 HOURS AS NEEDED  zolpidem (AMBIEN) 10 mg tablet Take 10 mg by mouth at bedtime as needed  amitriptyline (ELAVIL) 25 MG tablet Take 1 tablet (25 mg total) by mouth nightly (Patient not taking: Reported on 11/07/2021) 30 tablet 11  amLODIPine (NORVASC) 10 MG tablet Take 1 tablet (10 mg total) by mouth once daily (Patient not taking: Reported on 11/07/2021) 90 tablet 2  losartan (COZAAR) 50 MG tablet Take 1 tablet (50 mg total) by mouth once daily (Patient not taking: Reported on 11/07/2021) 30 tablet 11  PARoxetine (PAXIL) 10 MG tablet Take 1 tablet (10 mg total) by mouth once daily 30 tablet 4   Allergies:  Zanaflex [Tizanidine] Other (See Comments)  Throat closing and trouble breathing  Lisinopril Cough  Tramadol Itching  Can  take with Benadryl   Past Medical History:  Anesthesia complication  long time for bowels to wake up and has constipation  Arthritis  Colon polyps  Depression  Dysesthesia 12/01/2010  Fibromyalgia 12/01/2010  GERD (gastroesophageal reflux disease)  Hyperlipidemia  Hypertension  Insomnia  Neuropathy  Poor intravenous access  small veins  Urinary urgency   Past Surgical History:  COLONOSCOPY W/BIOPSY 07/28/2012  Procedure: COLONOSCOPY W/BIOPSY; Surgeon: Silverio Lay, MD; Location: [facility]; Service:  Gastroenterology;;  ARTHROPLASTY TOTAL KNEE Left 06/29/2014  Procedure: ARTHROPLASTY TOTAL KNEE; Surgeon: Lucie Leather, MD; Location: [facility]; Service: Orthopedics; Laterality: Left;  BREAST BIOPSY Right 2018  benign  LAMINECTOMY THORACIC FOR BIOPSY/EXCISION EXTRADURAL NEOPLASM N/A 12/23/2017  Procedure: THORACIC LAM FOR CYST FENESTRATION; Surgeon: Bethel Born, MD; Location: [facility]; Service: Neurosurgery; Laterality: N/A;  MICROSURGERY N/A 12/23/2017  Procedure: MICROSURGICAL TECHNIQUES, REQUIRING USE OF OPERATING MICROSCOPE; Surgeon: Bethel Born, MD; Location: [facility]; Service: Neurosurgery; Laterality: N/A;  COLONOSCOPY N/A 03/10/2019  Procedure: COLORECTAL CANCER SCREENING; COLONOSCOPY ON INDIVIDUAL AT HIGH RISK; Surgeon: Daralene Milch., MD; Location: [facility]; Service: Gastroenterology; Laterality: N/A;  ultrasound guided breast biopsy Left 07/12/2021  needle core breast biopsy Left 10/26/2021  CESAREAN SECTION  CESAREAN SECTION  CESAREAN SECTION  MYOMECTOMY  MYOMECTOMY  with exploratory lap for post ileus and fluid collection  TUBAL LIGATION  TUBAL LIGATION   Family History:  Cirrhosis Mother  Alcohol abuse Mother  Lung cancer Father  Substance Abuse Sister Annice Pih  Alcohol abuse Sister Annice Pih  Mental illness Sister Annice Pih  Substance Abuse Sister Engineer, site  Alcohol abuse Sister Engineer, site  Schizophrenia Sister Engineer, site  Anxiety Sister Eliezer Champagne  Suicidality Sister Engineer, site  Substance Abuse Sister Mikle Bosworth  Alcohol abuse Sister Mikle Bosworth  Mental illness Sister Mikle Bosworth  Lung cancer Maternal Grandmother  Alcohol abuse Paternal Grandfather  Breast cancer Neg Hx  Colon cancer Neg Hx  Coronary Artery Disease (Blocked arteries around heart) Neg Hx  Ovarian cancer Neg Hx   Social History:   Socioeconomic History:  Marital status: Divorced  Number of children: 3  Highest education level: 11th grade   Occupational History  Occupation: Surveyor, mining (part time)  Tobacco Use  Smoking status: Former  Current packs/day: 0.00  Average packs/day: 1 pack/day for 8.0 years (8.0 ttl pk-yrs)  Types: Cigarettes  Start date: 02/26/1963  Quit date: 02/26/1971  Years since quitting: 52.0  Smokeless tobacco: Never  Vaping Use  Vaping status: Never Used  Substance and Sexual Activity  Alcohol use: Not Currently  Alcohol/week: 0.0 - 1.0 standard drinks of alcohol  Comment: rarely  Drug use: No  Sexual activity: Not Currently   Social Determinants of Health:   Financial Resource Strain: Low Risk (10/26/2022)  Received [facility]  Overall Financial Resource Strain (CARDIA)  Difficulty of Paying Living Expenses: Not hard at all  Food Insecurity: No Food Insecurity (10/26/2022)  Received from [facility]  Hunger Vital Sign  Worried About Running Out of Food in the Last Year: Never true  Ran Out of Food in the Last Year: Never true  Transportation Needs: No Transportation Needs (10/26/2022)  Received from [facility] - Transportation  Lack of Transportation (Medical): No  Lack of Transportation (Non-Medical): No   Review of Systems:  A comprehensive 14 point ROS was performed, reviewed, and the pertinent orthopaedic findings are documented in the HPI.  Physical Exam: Vitals:  02/28/23 1610  BP: (!) 150/92  Weight: 91.6 kg (  202 lb)  Height: 149.9 cm (4\' 11" )  PainSc: 6  PainLoc: Ankle   General/Constitutional: Pleasant significantly overweight middle-age female in no acute distress. Neuro/Psych: Normal mood and affect, oriented to person, place and time. Eyes: Non-icteric. Pupils are equal, round, and reactive to light, and exhibit synchronous movement. ENT: Unremarkable. Lymphatic: No palpable adenopathy. Respiratory: Lungs clear to auscultation, Normal chest excursion, No wheezes, and Non-labored breathing Cardiovascular: Regular rate and rhythm. No murmurs. and No  edema, swelling or tenderness, except as noted in detailed exam. Integumentary: No impressive skin lesions present, except as noted in detailed exam. Musculoskeletal: Unremarkable, except as noted in detailed exam.  Heart: Examination of the heart reveals regular, rate, and rhythm. There is no murmur noted on ascultation. There is a normal apical pulse.  Lungs: Lungs are clear to auscultation. There is no wheeze, rhonchi, or crackles. There is normal expansion of bilateral chest walls.   Right ankle exam: The patient ambulates with a mild limp, favoring her right leg, uses a cane for balance and support. Skin inspection of the right ankle is notable for a firm mildly tender soft tissue mass over the anterolateral aspect of her ankle which measures approximately 2 x 2.5 cm in dimension. The overlying skin is unremarkable. No swelling, erythema, ecchymosis, abrasions, or other skin abnormalities are identified. She exhibits full active and passive motion of her ankle without any pain. She is grossly neurovascularly intact to the right foot.  X-rays/MRI/Lab data:  AP, lateral, and mortise views of the right ankle are obtained. These films demonstrate no evidence for fractures, lytic lesions, or significant degenerative changes. The mortise is well-maintained.  Assessment: 1. Benign neoplasm of skin of right ankle.  2. BMI 40.0-44.9, adult (CMS/HHS-HCC)   Plan: The treatment options were discussed with the patient. In addition, patient educational materials were provided regarding the diagnosis and treatment options. The patient is quite frustrated by her symptoms and function limitations and is ready to consider more aggressive treatment options. She is convinced that the soft tissue masses of the primary source of her lower leg and ankle pain more so than the underlying neuropathy. Therefore, I have recommended a surgical procedure, specifically an excision of the benign neoplasm of her right  foot/ankle. The procedure was discussed with the patient, as were the potential risks (including bleeding, infection, nerve and/or blood vessel injury, persistent or recurrent pain, recurrence of the mass, stiffness of the ankle, need for further surgery, blood clots, strokes, heart attacks and/or arhythmias, pneumonia, etc.) and benefits. The patient states her understanding and wishes to proceed. All of the patient's questions and concerns were answered. She can call any time with further concerns. She will follow up post-surgery, routine.    H&P reviewed and patient re-examined. No changes.

## 2023-03-06 ENCOUNTER — Encounter: Payer: Self-pay | Admitting: Surgery

## 2023-03-06 LAB — SURGICAL PATHOLOGY

## 2023-04-08 ENCOUNTER — Encounter: Payer: 59 | Admitting: Internal Medicine

## 2023-04-08 NOTE — Progress Notes (Deleted)
Subjective:    Patient ID: Kristin Porter, female    DOB: 11-Oct-1960, 62 y.o.   MRN: [id number]  HPI  Patient presents the clinic today for her annual exam.  Flu: 02/2012 Tetanus: 04/2018 COVID: Never Shingrix: Never Pap smear: 08/2019 Mammogram: 08/2022 Bone density: 09/2019 Colon screening: 02/2019 Vision screening: annually Dentist: biannually  Diet: She does eat meat. She consumes fruits and veggies. She does eat some fried foods. She drinks mostly water, tea, juice. 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 Swallow whole. (Patient taking differently: Take 81 mg by mouth 3 (three) times a week. Swallow whole.) 30 tablet 12   baclofen (LIORESAL) 10 MG tablet Take 1 tablet (10 mg total) by mouth 4 (four) times daily as needed for muscle spasms. 120 each 5   Blood Pressure Monitoring (BLOOD PRESSURE KIT) DEVI 1 Units by Does not apply route in the morning and at bedtime. 1 each 0   butalbital-acetaminophen-caffeine (FIORICET) 50-325-40 MG tablet Take 1 tablet by mouth every 6 (six) hours as needed for headache. 14 tablet 0   fluticasone (FLONASE) 50 MCG/ACT nasal spray Place 1 spray into both nostrils 2 (two) times daily. (Patient taking differently: Place 1 spray into both nostrils as needed.) 9.9 mL 1   meloxicam (MOBIC) 15 MG tablet Take 1 tablet (15 mg total) by mouth daily as needed for pain. 90 tablet 1   pantoprazole (PROTONIX) 40 MG tablet Take 1 tablet (40 mg total) by mouth 2 (two) times daily. 60 tablet 2   pregabalin  (LYRICA) 100 MG capsule Take 1-2 capsules (100-200 mg total) by mouth 2 (two) times daily. 60 capsule 5   Suvorexant (BELSOMRA) 5 MG TABS Take 1 tablet (5 mg total) by mouth at bedtime. 30 tablet 0   traMADol (ULTRAM-ER) 100 MG 24 hr tablet Take 1 tablet (100 mg total) by mouth daily. 30 tablet 2   triamcinolone cream (KENALOG) 0.1 % Apply 1 application topically 2 (two) times daily. (Patient taking differently: Apply 1 application  topically as needed.) 30 g 0   No current facility-administered medications for this visit.    Allergies  Allergen Reactions   Tizanidine Other (See Comments)    Throat closing and trouble breathing    Lisinopril Cough   Tramadol Itching    Can take with Benadryl    Family History  Family history unknown: Yes    Social History   Socioeconomic History   Marital status: Divorced    Spouse name: Not on file   Number of children: Not on file   Years of education: Not on file   Highest education level: Not on file  Occupational History   Occupation: disability   Tobacco Use   Smoking status: Former    Current packs/day: 0.00    Types: Cigarettes    Quit date: 1970    Years since quitting: 54.9    Passive exposure: Past   Smokeless  tobacco: Never  Vaping Use   Vaping status: Never Used  Substance and Sexual Activity   Alcohol use: Not Currently   Drug use: Never   Sexual activity: Not Currently  Other Topics Concern   Not on file  Social History Narrative   Lives alone   Social Determinants of Health   Financial Resource Strain: Low Risk  (10/26/2022)   Overall Financial Resource Strain (CARDIA)    Difficulty of Paying Living Expenses: Not hard at all  Food Insecurity: No Food Insecurity (10/26/2022)   Hunger Vital Sign    Worried About Running Out of Food in the Last Year: Never true    Ran Out of Food in the Last Year: Never true  Transportation Needs: No Transportation Needs (10/26/2022)   PRAPARE - Scientist, research (physical sciences) (Medical): No    Lack of Transportation (Non-Medical): No  Physical Activity: Insufficiently Active (10/26/2022)   Exercise Vital Sign    Days of Exercise per Week: 2 days    Minutes of Exercise per Session: 20 min  Stress: No Stress Concern Present (10/26/2022)   [employer] of Occupational Health - Occupational Stress Questionnaire    Feeling of Stress : Not at all  Social Connections: Socially Isolated (10/26/2022)   Social Connection and Isolation Panel [NHANES]    Frequency of Communication with Friends and Family: More than three times a week    Frequency of Social Gatherings with Friends and Family: Three times a week    Attends Religious Services: Never    Active Member of Clubs or Organizations: No    Attends Banker Meetings: Never    Marital Status: Divorced  Catering manager Violence: Not At Risk (10/26/2022)   Humiliation, Afraid, Rape, and Kick questionnaire    Fear of Current or Ex-Partner: No    Emotionally Abused: No    Physically Abused: No    Sexually Abused: No     Constitutional: Pt reports intermittent headache. Denies fever, malaise, fatigue, headache or abrupt weight changes.  HEENT: Denies eye pain, eye redness, ear pain, ringing in the ears, wax buildup, runny nose, nasal congestion, bloody nose, or sore throat. Respiratory: Denies difficulty breathing, shortness of breath, cough or sputum production.   Cardiovascular: Denies chest pain, chest tightness, palpitations or swelling in the hands or feet.  Gastrointestinal: Denies abdominal pain, bloating, constipation, diarrhea or blood in the stool.  GU: Denies urgency, frequency, pain with urination, burning sensation, blood in urine, odor or discharge. Musculoskeletal: Patient reports chronic joint and  muscle pain.  Denies decrease in range of motion, difficulty with gait, or joint swelling.  Skin: Denies redness, rashes, lesions or ulcercations.  Neurological: Denies dizziness,  difficulty with memory, difficulty with speech or problems with balance and coordination.  Psych: Denies anxiety, depression, SI/HI.  No other specific complaints in a complete review of systems (except as listed in HPI above).     Objective:   Physical Exam  There were no vitals taken for this visit.  Wt Readings from Last 3 Encounters:  03/05/23 193 lb (87.5 kg)  02/21/23 193 lb (87.5 kg)  01/11/23 194 lb (88 kg)    General: Appears her stated age, obese, in NAD. Skin: Warm, dry and intact. No rashes noted. HEENT: Head: normal shape and size; Eyes: sclera white, no icterus, conjunctiva pink, PERRLA and EOMs intact;  Neck:  Neck supple, trachea midline. No masses, lumps or thyromegaly present.  Cardiovascular: Normal rate and rhythm. S1,S2 noted.  No murmur, rubs or gallops noted. No JVD or BLE edema. No carotid bruits noted. Pulmonary/Chest: Normal effort and positive vesicular breath sounds. No respiratory distress. No wheezes, rales or ronchi noted.  Abdomen: Normal bowel sounds.  Musculoskeletal: Strength 5/5 BUE/BLE. No difficulty with gait.  Neurological: Alert and oriented. Cranial nerves II-XII grossly intact. Coordination normal.  Psychiatric: Mood and affect normal. Behavior is normal. Judgment and thought content normal.     BMET    Component Value Date/Time   NA 137 02/26/2023 1049   K 4.1 02/26/2023 1049   CL 103 02/26/2023 1049   CO2 26 02/26/2023 1049   GLUCOSE 97 02/26/2023 1049   BUN 10 02/26/2023 1049   CREATININE 0.61 02/26/2023 1049   CREATININE 0.64 10/05/2022 1342   CALCIUM 8.8 (L) 02/26/2023 1049   GFRNONAA >60 02/26/2023 1049   GFRNONAA 92 03/22/2020 0911   GFRAA 107 03/22/2020 0911    Lipid Panel     Component Value Date/Time   CHOL 199 10/05/2022 1342   TRIG 81 10/05/2022 1342   HDL 48 (L) 10/05/2022 1342   CHOLHDL 4.1 10/05/2022 1342   VLDL 17 09/08/2020 0914   LDLCALC 133 (H) 10/05/2022 1342    CBC    Component Value Date/Time    WBC 6.5 02/26/2023 1049   RBC 4.72 02/26/2023 1049   HGB 14.0 02/26/2023 1049   HCT 41.3 02/26/2023 1049   PLT 290 02/26/2023 1049   MCV 87.5 02/26/2023 1049   MCH 29.7 02/26/2023 1049   MCHC 33.9 02/26/2023 1049   RDW 13.1 02/26/2023 1049   LYMPHSABS 2,051 03/22/2020 0911   MONOABS 0.8 12/23/2018 1153   EOSABS 73 03/22/2020 0911   BASOSABS 37 03/22/2020 0911    Hgb A1C Lab Results  Component Value Date   HGBA1C 6.0 (H) 10/05/2022           Assessment & Plan:   Preventative Health Maintenance:  She declines flu shot today Tetanus UTD Encouraged her to get her COVID-vaccine Discussed Shingrix vaccine, she will check coverage with her insurance company and get this done at the pharmacy Pap smear UTD Mammogram UTD Bone density UTD Colon screening UTD Encouraged her to consume a balanced diet and exercise regimen Advised her to see an eye doctor and dentist annually We will check CBC, c-Met, lipid and A1c today  RTC in 6 months, follow-up chronic conditions Nicki Reaper, NP

## 2023-04-11 ENCOUNTER — Ambulatory Visit (INDEPENDENT_AMBULATORY_CARE_PROVIDER_SITE_OTHER): Payer: 59 | Admitting: Internal Medicine

## 2023-04-11 ENCOUNTER — Encounter: Payer: Self-pay | Admitting: Internal Medicine

## 2023-04-11 VITALS — BP 126/78 | Ht 59.0 in | Wt 190.0 lb

## 2023-04-11 DIAGNOSIS — E66812 Obesity, class 2: Secondary | ICD-10-CM | POA: Diagnosis not present

## 2023-04-11 DIAGNOSIS — Z6838 Body mass index (BMI) 38.0-38.9, adult: Secondary | ICD-10-CM

## 2023-04-11 DIAGNOSIS — Z0001 Encounter for general adult medical examination with abnormal findings: Secondary | ICD-10-CM | POA: Diagnosis not present

## 2023-04-11 DIAGNOSIS — E782 Mixed hyperlipidemia: Secondary | ICD-10-CM | POA: Diagnosis not present

## 2023-04-11 DIAGNOSIS — R7303 Prediabetes: Secondary | ICD-10-CM | POA: Diagnosis not present

## 2023-04-11 MED ORDER — PAROXETINE HCL 10 MG PO TABS: 10.0000 mg | ORAL_TABLET | Freq: Every day | ORAL | 1 refills | Status: AC

## 2023-04-11 NOTE — Patient Instructions (Signed)
Health Maintenance for Postmenopausal Women Menopause is a normal process in which your ability to get pregnant comes to an end. This process happens slowly over many months or years, usually between the ages of 48 and 55. Menopause is complete when you have missed your menstrual period for 12 months. It is important to talk with your health care provider about some of the most common conditions that affect women after menopause (postmenopausal women). These include heart disease, cancer, and bone loss (osteoporosis). Adopting a healthy lifestyle and getting preventive care can help to promote your health and wellness. The actions you take can also lower your chances of developing some of these common conditions. What are the signs and symptoms of menopause? During menopause, you may have the following symptoms: Hot flashes. These can be moderate or severe. Night sweats. Decrease in sex drive. Mood swings. Headaches. Tiredness (fatigue). Irritability. Memory problems. Problems falling asleep or staying asleep. Talk with your health care provider about treatment options for your symptoms. Do I need hormone replacement therapy? Hormone replacement therapy is effective in treating symptoms that are caused by menopause, such as hot flashes and night sweats. Hormone replacement carries certain risks, especially as you become older. If you are thinking about using estrogen or estrogen with progestin, discuss the benefits and risks with your health care provider. How can I reduce my risk for heart disease and stroke? The risk of heart disease, heart attack, and stroke increases as you age. One of the causes may be a change in the body's hormones during menopause. This can affect how your body uses dietary fats, triglycerides, and cholesterol. Heart attack and stroke are medical emergencies. There are many things that you can do to help prevent heart disease and stroke. Watch your blood pressure High  blood pressure causes heart disease and increases the risk of stroke. This is more likely to develop in people who have high blood pressure readings or are overweight. Have your blood pressure checked: Every 3-5 years if you are 18-39 years of age. Every year if you are 40 years old or older. Eat a healthy diet  Eat a diet that includes plenty of vegetables, fruits, low-fat dairy products, and lean protein. Do not eat a lot of foods that are high in solid fats, added sugars, or sodium. Get regular exercise Get regular exercise. This is one of the most important things you can do for your health. Most adults should: Try to exercise for at least 150 minutes each week. The exercise should increase your heart rate and make you sweat (moderate-intensity exercise). Try to do strengthening exercises at least twice each week. Do these in addition to the moderate-intensity exercise. Spend less time sitting. Even light physical activity can be beneficial. Other tips Work with your health care provider to achieve or maintain a healthy weight. Do not use any products that contain nicotine or tobacco. These products include cigarettes, chewing tobacco, and vaping devices, such as e-cigarettes. If you need help quitting, ask your health care provider. Know your numbers. Ask your health care provider to check your cholesterol and your blood sugar (glucose). Continue to have your blood tested as directed by your health care provider. Do I need screening for cancer? Depending on your health history and family history, you may need to have cancer screenings at different stages of your life. This may include screening for: Breast cancer. Cervical cancer. Lung cancer. Colorectal cancer. What is my risk for osteoporosis? After menopause, you may be   at increased risk for osteoporosis. Osteoporosis is a condition in which bone destruction happens more quickly than new bone creation. To help prevent osteoporosis or  the bone fractures that can happen because of osteoporosis, you may take the following actions: If you are 19-50 years old, get at least 1,000 mg of calcium and at least 600 international units (IU) of vitamin D per day. If you are older than age 50 but younger than age 70, get at least 1,200 mg of calcium and at least 600 international units (IU) of vitamin D per day. If you are older than age 70, get at least 1,200 mg of calcium and at least 800 international units (IU) of vitamin D per day. Smoking and drinking excessive alcohol increase the risk of osteoporosis. Eat foods that are rich in calcium and vitamin D, and do weight-bearing exercises several times each week as directed by your health care provider. How does menopause affect my mental health? Depression may occur at any age, but it is more common as you become older. Common symptoms of depression include: Feeling depressed. Changes in sleep patterns. Changes in appetite or eating patterns. Feeling an overall lack of motivation or enjoyment of activities that you previously enjoyed. Frequent crying spells. Talk with your health care provider if you think that you are experiencing any of these symptoms. General instructions See your health care provider for regular wellness exams and vaccines. This may include: Scheduling regular health, dental, and eye exams. Getting and maintaining your vaccines. These include: Influenza vaccine. Get this vaccine each year before the flu season begins. Pneumonia vaccine. Shingles vaccine. Tetanus, diphtheria, and pertussis (Tdap) booster vaccine. Your health care provider may also recommend other immunizations. Tell your health care provider if you have ever been abused or do not feel safe at home. Summary Menopause is a normal process in which your ability to get pregnant comes to an end. This condition causes hot flashes, night sweats, decreased interest in sex, mood swings, headaches, or lack  of sleep. Treatment for this condition may include hormone replacement therapy. Take actions to keep yourself healthy, including exercising regularly, eating a healthy diet, watching your weight, and checking your blood pressure and blood sugar levels. Get screened for cancer and depression. Make sure that you are up to date with all your vaccines. This information is not intended to replace advice given to you by your health care provider. Make sure you discuss any questions you have with your health care provider. Document Revised: 09/26/2020 Document Reviewed: 09/26/2020 Elsevier Patient Education  2024 [employer] Inc[employer]  

## 2023-04-11 NOTE — Assessment & Plan Note (Signed)
Encouraged diet and exercise for weight loss ?

## 2023-04-11 NOTE — Progress Notes (Signed)
Subjective:    Patient ID: Kristin Porter, female    DOB: 08/12/1960, 62 y.o.   MRN: [id number]  HPI  Patient presents the clinic today for her annual exam.  Flu: 02/2012 Tetanus: 04/2018 COVID: Never Shingrix: Never Pap smear: 08/2019 Mammogram: 08/2022 Bone density: 09/2019 Colon screening: 02/2019 Vision screening: annually Dentist: biannually  Diet: She does eat meat. She consumes fruits and veggies. She does eat some fried foods. She drinks mostly water, tea, juice. 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 Swallow whole. (Patient taking differently: Take 81 mg by mouth 3 (three) times a week. Swallow whole.) 30 tablet 12   baclofen (LIORESAL) 10 MG tablet Take 1 tablet (10 mg total) by mouth 4 (four) times daily as needed for muscle spasms. 120 each 5   Blood Pressure Monitoring (BLOOD PRESSURE KIT) DEVI 1 Units by Does not apply route in the morning and at bedtime. 1 each 0   butalbital-acetaminophen-caffeine (FIORICET) 50-325-40 MG tablet Take 1 tablet by mouth every 6 (six) hours as needed for headache. 14 tablet 0   fluticasone (FLONASE) 50 MCG/ACT nasal spray Place 1 spray into both nostrils 2 (two) times daily. (Patient taking differently: Place 1 spray into both nostrils as needed.) 9.9 mL 1   meloxicam (MOBIC) 15 MG tablet Take 1 tablet (15 mg total) by mouth daily as needed for pain. 90 tablet 1   pantoprazole (PROTONIX) 40 MG tablet Take 1 tablet (40 mg total) by mouth 2 (two) times daily. 60 tablet 2   pregabalin  (LYRICA) 100 MG capsule Take 1-2 capsules (100-200 mg total) by mouth 2 (two) times daily. 60 capsule 5   Suvorexant (BELSOMRA) 5 MG TABS Take 1 tablet (5 mg total) by mouth at bedtime. 30 tablet 0   traMADol (ULTRAM-ER) 100 MG 24 hr tablet Take 1 tablet (100 mg total) by mouth daily. 30 tablet 2   triamcinolone cream (KENALOG) 0.1 % Apply 1 application topically 2 (two) times daily. (Patient taking differently: Apply 1 application  topically as needed.) 30 g 0   No current facility-administered medications for this visit.    Allergies  Allergen Reactions   Tizanidine Other (See Comments)    Throat closing and trouble breathing    Lisinopril Cough   Tramadol Itching    Can take with Benadryl    Family History  Family history unknown: Yes    Social History   Socioeconomic History   Marital status: Divorced    Spouse name: Not on file   Number of children: Not on file   Years of education: Not on file   Highest education level: 11th grade  Occupational History   Occupation: disability   Tobacco Use   Smoking status: Former    Current packs/day: 0.00    Types: Cigarettes    Quit date: 1970    Years since quitting: 54.9    Passive exposure: Past   Smokeless tobacco:  Never  Vaping Use   Vaping status: Never Used  Substance and Sexual Activity   Alcohol use: Not Currently   Drug use: Never   Sexual activity: Not Currently  Other Topics Concern   Not on file  Social History Narrative   Lives alone   Social Determinants of Health   Financial Resource Strain: Low Risk  (04/08/2023)   Overall Financial Resource Strain (CARDIA)    Difficulty of Paying Living Expenses: Not hard at all  Food Insecurity: No Food Insecurity (04/08/2023)   Hunger Vital Sign    Worried About Running Out of Food in the Last Year: Never true    Ran Out of Food in the Last Year: Never true  Transportation Needs: No Transportation Needs (04/08/2023)   PRAPARE - Scientist, research (physical sciences) (Medical): No    Lack of Transportation (Non-Medical): No  Physical Activity: Inactive (04/08/2023)   Exercise Vital Sign    Days of Exercise per Week: 0 days    Minutes of Exercise per Session: 20 min  Stress: No Stress Concern Present (04/08/2023)   [employer] of Occupational Health - Occupational Stress Questionnaire    Feeling of Stress : Only a little  Social Connections: Socially Isolated (04/08/2023)   Social Connection and Isolation Panel [NHANES]    Frequency of Communication with Friends and Family: More than three times a week    Frequency of Social Gatherings with Friends and Family: Twice a week    Attends Religious Services: Never    Database administrator or Organizations: No    Attends Banker Meetings: Never    Marital Status: Divorced  Catering manager Violence: Not At Risk (10/26/2022)   Humiliation, Afraid, Rape, and Kick questionnaire    Fear of Current or Ex-Partner: No    Emotionally Abused: No    Physically Abused: No    Sexually Abused: No     Constitutional: Pt reports intermittent headache. Denies fever, malaise, fatigue, headache or abrupt weight changes.  HEENT: Denies eye pain, eye redness, ear pain, ringing in the ears, wax buildup, runny nose, nasal congestion, bloody nose, or sore throat. Respiratory: Denies difficulty breathing, shortness of breath, cough or sputum production.   Cardiovascular: Denies chest pain, chest tightness, palpitations or swelling in the hands or feet.  Gastrointestinal: Pt reports intermittent reflux, constipation. Denies abdominal pain, bloating, diarrhea or blood in the stool.  GU: Denies urgency, frequency, pain with urination, burning sensation, blood in urine, odor or discharge. Musculoskeletal: Patient reports chronic joint and muscle pain.  Denies decrease in range of motion, difficulty with gait, or joint swelling.  Skin: Denies redness, rashes, lesions or ulcercations.  Neurological:  Pt reports insomnia. Denies dizziness, difficulty with memory, difficulty with speech or problems with balance and coordination.  Psych: Denies anxiety, depression, SI/HI.  No other specific complaints in a complete review of systems (except as listed in HPI above).     Objective:   Physical Exam BP 126/78   Ht 4\' 11"  (1.499 m)   Wt 190 lb (86.2 kg)   BMI 38.38 kg/m    Wt Readings from Last 3 Encounters:  03/05/23 193 lb (87.5 kg)  02/21/23 193 lb (87.5 kg)  01/11/23 194 lb (88 kg)    General: Appears her stated age, obese, in NAD. Skin: Warm, dry and intact. No rashes noted. HEENT: Head: normal shape and size; Eyes: sclera white, no icterus, conjunctiva pink, PERRLA and EOMs intact;  Neck:  Neck  supple, trachea midline. No masses, lumps or thyromegaly present.  Cardiovascular: Normal rate and rhythm. S1,S2 noted.  No murmur, rubs or gallops noted. No JVD or BLE edema. No carotid bruits noted. Pulmonary/Chest: Normal effort and positive vesicular breath sounds. No respiratory distress. No wheezes, rales or ronchi noted.  Abdomen: Normal bowel sounds.  Musculoskeletal: Strength 5/5 BUE/BLE. She has difficulty getting from a sitting to a standing position.  Gait slow but steady with use of cane. Neurological: Alert and oriented. Cranial nerves II-XII grossly intact. Coordination normal.  Psychiatric: Mood and affect normal. Behavior is normal. Judgment and thought content normal.     BMET    Component Value Date/Time   NA 137 02/26/2023 1049   K 4.1 02/26/2023 1049   CL 103 02/26/2023 1049   CO2 26 02/26/2023 1049   GLUCOSE 97 02/26/2023 1049   BUN 10 02/26/2023 1049   CREATININE 0.61 02/26/2023 1049   CREATININE 0.64 10/05/2022 1342   CALCIUM 8.8 (L) 02/26/2023 1049   GFRNONAA >60 02/26/2023 1049   GFRNONAA 92 03/22/2020 0911   GFRAA 107 03/22/2020 0911    Lipid Panel     Component Value Date/Time   CHOL 199 10/05/2022 1342   TRIG 81 10/05/2022 1342   HDL 48 (L)  10/05/2022 1342   CHOLHDL 4.1 10/05/2022 1342   VLDL 17 09/08/2020 0914   LDLCALC 133 (H) 10/05/2022 1342    CBC    Component Value Date/Time   WBC 6.5 02/26/2023 1049   RBC 4.72 02/26/2023 1049   HGB 14.0 02/26/2023 1049   HCT 41.3 02/26/2023 1049   PLT 290 02/26/2023 1049   MCV 87.5 02/26/2023 1049   MCH 29.7 02/26/2023 1049   MCHC 33.9 02/26/2023 1049   RDW 13.1 02/26/2023 1049   LYMPHSABS 2,051 03/22/2020 0911   MONOABS 0.8 12/23/2018 1153   EOSABS 73 03/22/2020 0911   BASOSABS 37 03/22/2020 0911    Hgb A1C Lab Results  Component Value Date   HGBA1C 6.0 (H) 10/05/2022           Assessment & Plan:   Preventative Health Maintenance:  She declines flu shot today Tetanus UTD Encouraged her to get her COVID-vaccine Discussed Shingrix vaccine, she will check coverage with her insurance company and get this done at the pharmacy Pap smear UTD Mammogram UTD Bone density UTD Colon screening UTD Encouraged her to consume a balanced diet and exercise regimen Advised her to see an eye doctor and dentist annually We will check CBC, c-Met, lipid and A1c today  RTC in 6 months, follow-up chronic conditions Nicki Reaper, NP

## 2023-04-12 LAB — CBC
HCT: 40.9 % (ref 35.0–45.0)
Hemoglobin: 13.8 g/dL (ref 11.7–15.5)
MCH: 29.4 pg (ref 27.0–33.0)
MCHC: 33.7 g/dL (ref 32.0–36.0)
MCV: 87.2 fL (ref 80.0–100.0)
MPV: 10.7 fL (ref 7.5–12.5)
Platelets: 299 10*3/uL (ref 140–400)
RBC: 4.69 10*6/uL (ref 3.80–5.10)
RDW: 13 % (ref 11.0–15.0)
WBC: 5.6 10*3/uL (ref 3.8–10.8)

## 2023-04-12 LAB — COMPLETE METABOLIC PANEL WITH GFR
AG Ratio: 1.3 (calc) (ref 1.0–2.5)
ALT: 11 U/L (ref 6–29)
AST: 14 U/L (ref 10–35)
Albumin: 4.2 g/dL (ref 3.6–5.1)
Alkaline phosphatase (APISO): 60 U/L (ref 37–153)
BUN: 13 mg/dL (ref 7–25)
CO2: 26 mmol/L (ref 20–32)
Calcium: 9.3 mg/dL (ref 8.6–10.4)
Chloride: 106 mmol/L (ref 98–110)
Creat: 0.65 mg/dL (ref 0.50–1.05)
Globulin: 3.3 g/dL (ref 1.9–3.7)
Glucose, Bld: 96 mg/dL (ref 65–99)
Potassium: 4.3 mmol/L (ref 3.5–5.3)
Sodium: 138 mmol/L (ref 135–146)
Total Bilirubin: 0.4 mg/dL (ref 0.2–1.2)
Total Protein: 7.5 g/dL (ref 6.1–8.1)
eGFR: 98 mL/min/{1.73_m2} (ref >=60)

## 2023-04-12 LAB — LIPID PANEL
Cholesterol: 211 mg/dL — ABNORMAL HIGH (ref <200)
HDL: 42 mg/dL — ABNORMAL LOW (ref >=50)
LDL Cholesterol (Calc): 145 mg/dL — ABNORMAL HIGH
Non-HDL Cholesterol (Calc): 169 mg/dL — ABNORMAL HIGH (ref <130)
Total CHOL/HDL Ratio: 5 (calc) — ABNORMAL HIGH (ref <5.0)
Triglycerides: 118 mg/dL (ref <150)

## 2023-04-12 LAB — HEMOGLOBIN A1C
Hgb A1c MFr Bld: 5.9 %{Hb} — ABNORMAL HIGH (ref <5.7)
Mean Plasma Glucose: 123 mg/dL
eAG (mmol/L): 6.8 mmol/L

## 2023-05-12 ENCOUNTER — Other Ambulatory Visit: Payer: Self-pay | Admitting: Student in an Organized Health Care Education/Training Program

## 2023-05-12 DIAGNOSIS — M7918 Myalgia, other site: Secondary | ICD-10-CM

## 2023-05-12 DIAGNOSIS — G894 Chronic pain syndrome: Secondary | ICD-10-CM

## 2023-05-12 DIAGNOSIS — M797 Fibromyalgia: Secondary | ICD-10-CM

## 2023-05-17 ENCOUNTER — Telehealth: Payer: Self-pay

## 2023-05-17 ENCOUNTER — Other Ambulatory Visit: Payer: Self-pay

## 2023-05-17 ENCOUNTER — Other Ambulatory Visit: Payer: Self-pay | Admitting: Physician Assistant

## 2023-05-17 DIAGNOSIS — K219 Gastro-esophageal reflux disease without esophagitis: Secondary | ICD-10-CM

## 2023-05-17 MED ORDER — PANTOPRAZOLE SODIUM 40 MG PO TBEC: 40.0000 mg | DELAYED_RELEASE_TABLET | Freq: Two times a day (BID) | ORAL | 1 refills | Status: DC

## 2023-05-17 NOTE — Telephone Encounter (Signed)
Received refill request from pharmacy for Pantoprazole 40 mg BID-sent to [facility] refill only. Tina on [address].

## 2023-05-21 ENCOUNTER — Other Ambulatory Visit: Payer: Self-pay | Admitting: *Deleted

## 2023-05-21 ENCOUNTER — Encounter: Payer: Self-pay | Admitting: Student in an Organized Health Care Education/Training Program

## 2023-05-21 ENCOUNTER — Telehealth: Payer: Self-pay | Admitting: Student in an Organized Health Care Education/Training Program

## 2023-05-21 ENCOUNTER — Ambulatory Visit
Payer: 59 | Attending: Student in an Organized Health Care Education/Training Program | Admitting: Student in an Organized Health Care Education/Training Program

## 2023-05-21 DIAGNOSIS — M7918 Myalgia, other site: Secondary | ICD-10-CM | POA: Diagnosis not present

## 2023-05-21 DIAGNOSIS — M797 Fibromyalgia: Secondary | ICD-10-CM | POA: Insufficient documentation

## 2023-05-21 DIAGNOSIS — M4714 Other spondylosis with myelopathy, thoracic region: Secondary | ICD-10-CM | POA: Diagnosis not present

## 2023-05-21 DIAGNOSIS — G894 Chronic pain syndrome: Secondary | ICD-10-CM | POA: Insufficient documentation

## 2023-05-21 MED ORDER — PREGABALIN 100 MG PO CAPS: 100.0000 mg | ORAL_CAPSULE | Freq: Two times a day (BID) | ORAL | 5 refills | Status: DC

## 2023-05-21 MED ORDER — MELOXICAM 15 MG PO TABS: 15.0000 mg | ORAL_TABLET | Freq: Every day | ORAL | 1 refills | Status: DC | PRN

## 2023-05-21 NOTE — Progress Notes (Signed)
 Nursing Pain Medication Assessment:  Safety precautions to be maintained throughout the outpatient stay will include: orient to surroundings, keep bed in low position, maintain call bell within reach at all times, provide assistance with transfer out of bed and ambulation.  Medication Inspection Compliance: [name] did not comply with our request to bring her pills to be counted. She was reminded that bringing the medication bottles, even when empty, is a requirement.  Medication: None brought in. Pill/Patch Count: None available to be counted. Bottle Appearance: No container available. Did not bring bottle(s) to appointment. Filled Date: N/A Last Medication intake:  Yesterday Safety precautions to be maintained throughout the outpatient stay will include: orient to surroundings, keep bed in low position, maintain call bell within reach at all times, provide assistance with transfer out of bed and ambulation.

## 2023-05-21 NOTE — Telephone Encounter (Signed)
Rx refill request sent to Dr. Cherylann Ratel.

## 2023-05-21 NOTE — Telephone Encounter (Signed)
Patient informed. 

## 2023-05-21 NOTE — Telephone Encounter (Signed)
 PT stated that her current pharmacy is out of Lyrica  until the 8th of Jan , PT wanted to see if this prescription can be send to [facility] on [address]. PT stated that she needed to pick up prescription today, she has been out for a while. Please give patient a call. TY

## 2023-05-21 NOTE — Progress Notes (Signed)
 PROVIDER NOTE: Information contained herein reflects review and annotations entered in association with encounter. Interpretation of such information and data should be left to medically-trained personnel. Information provided to patient can be located elsewhere in the medical record under Patient Instructions. Document created using STT-dictation technology, any transcriptional errors that may result from process are unintentional.    Patient: Kristin Porter  Service Category: E/M  Provider: Wallie Sherry, MD  DOB: January 15, 1961  DOS: 05/21/2023  Referring Provider: Antonette Angeline ORN, NP  MRN: [id number]  Specialty: Interventional Pain Management  PCP: Antonette Angeline ORN, NP  Type: Established Patient  Setting: Ambulatory outpatient    Location: Office  Delivery: Face-to-face     HPI  Kristin Porter, a 62 y.o. year old female, is here today because of her No primary diagnosis found.. Kristin Porter's primary complain today is Pain and Back Pain (lower)  Pertinent problems: Ms. Suk has Fibromyalgia; Lumbosacral spondylosis without myelopathy; and Chronic pain syndrome on their pertinent problem list. Pain Assessment: Severity of Chronic pain is reported as a 8 /10. Location: Back Lower/ . Onset: 1 to 4 weeks ago. Quality: Aching, Stabbing. Timing: Several days a week. Modifying factor(s): rest/sitting. Vitals:  height is 4' 11 (1.499 m) and weight is 192 lb (87.1 kg). Her temporal temperature is 97.1 F (36.2 C) (abnormal). Her blood pressure is 164/99 (abnormal) and her pulse is 75. Her oxygen  saturation is 99%.  BMI: Estimated body mass index is 38.78 kg/m as calculated from the following:   Height as of this encounter: 4' 11 (1.499 m).   Weight as of this encounter: 192 lb (87.1 kg). Last encounter: 02/21/2023. Last procedure: Visit date not found.  Reason for encounter:  MM  No change in medical history since last visit.  Patient's pain is at baseline.  Patient continues multimodal  pain regimen as prescribed.  States that it provides pain relief and improvement in functional status.     ROS  Constitutional: Denies any fever or chills Gastrointestinal: No reported hemesis, hematochezia, vomiting, or acute GI distress Musculoskeletal:  +LBP Neurological: No reported episodes of acute onset apraxia, aphasia, dysarthria, agnosia, amnesia, paralysis, loss of coordination, or loss of consciousness  Medication Review  Blood Pressure Kit, PARoxetine , Suvorexant , aspirin  EC, baclofen , butalbital -acetaminophen -caffeine , fluticasone , meloxicam , pantoprazole , pregabalin , traMADol , and triamcinolone  cream  History Review  Allergy: Kristin Porter is allergic to tizanidine, lisinopril, and tramadol . Drug: Kristin Porter  reports no history of drug use. Alcohol:  reports that she does not currently use alcohol. Tobacco:  reports that she quit smoking about 55 years ago. Her smoking use included cigarettes. She has been exposed to tobacco smoke. She has never used smokeless tobacco. Social: Kristin Porter  reports that she quit smoking about 55 years ago. Her smoking use included cigarettes. She has been exposed to tobacco smoke. She has never used smokeless tobacco. She reports that she does not currently use alcohol. She reports that she does not use drugs. 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 Surgical: Kristin Porter  has a past surgical history that includes Breast biopsy (Right, 2018); Esophagogastroduodenoscopy (egd) with propofol  (N/A, 02/22/2021); Breast biopsy (Left, 07/12/2021); Replacement total knee (Left); Cesarean section; Colonoscopy; Back surgery (2016); and Mass excision (Right, 03/05/2023). Family: Family history is unknown by  patient.  Laboratory Chemistry Profile  Renal Lab Results  Component Value Date   BUN 13 04/11/2023   CREATININE 0.65 04/11/2023   BCR SEE NOTE: 04/11/2023   GFRAA 107 03/22/2020   GFRNONAA >60 02/26/2023    Hepatic Lab Results  Component Value Date   AST 14 04/11/2023   ALT 11 04/11/2023    Electrolytes Lab Results  Component Value Date   NA 138 04/11/2023   K 4.3 04/11/2023   CL 106 04/11/2023   CALCIUM  9.3 04/11/2023    Bone No results found for: VD25OH, VD125OH2TOT, CI6874NY7, CI7874NY7, 25OHVITD1, 25OHVITD2, 25OHVITD3, TESTOFREE, TESTOSTERONE  Inflammation (CRP: Acute Phase) (ESR: Chronic Phase) Lab Results  Component Value Date   CRP 15.2 (H) 12/12/2020   ESRSEDRATE 34 (H) 12/12/2020         Note: Above Lab results reviewed.  Recent Imaging Review  DG ESOPHAGUS W DOUBLE CM (HD) CLINICAL DATA:  Patient with complaints of the sensation of regurgitation even when not eating or drinking.  EXAM: ESOPHAGUS/BARIUM SWALLOW/TABLET STUDY  TECHNIQUE: Combined double and single contrast examination was performed using effervescent crystals, high-density barium, and thin liquid barium. This exam was performed by Warren Dais, NP, and was supervised and interpreted by Dr. Manford Cummins.  FLUOROSCOPY: Radiation Exposure Index (as provided by the fluoroscopic device): 35.10 mGy Kerma  COMPARISON:  Chest radiograph dated 12/23/2018  FINDINGS: Swallowing: Appears normal. No vestibular penetration or aspiration seen.  Pharynx: Unremarkable.  Esophagus: Normal appearance.  Esophageal motility: Occasional mild tertiary contractions. Retrograde motion of small volume contrast demonstrated.  Hiatal Hernia: None.  Gastroesophageal reflux: None visualized.  Ingested 13mm barium tablet: No obstruction to the passage of a 13 mm tablet.  Other: None.  IMPRESSION: 1. Esophageal dysmotility demonstrated by tertiary contractions and intermittent  retrograde motion of esophageal contrast. 2. No frank gastroesophageal reflux demonstrated.  Electronically Signed   By: Limin  Xu M.D.   On: 11/05/2022 10:47 Note: Reviewed        Physical Exam  General appearance: Well nourished, well developed, and well hydrated. In no apparent acute distress Mental status: Alert, oriented x 3 (person, place, & time)       Respiratory: No evidence of acute respiratory distress Eyes: PERLA Vitals: BP (!) 164/99   Pulse 75   Temp (!) 97.1 F (36.2 C) (Temporal)   Ht 4' 11 (1.499 m)   Wt 192 lb (87.1 kg)   SpO2 99%   BMI 38.78 kg/m  BMI: Estimated body mass index is 38.78 kg/m as calculated from the following:   Height as of this encounter: 4' 11 (1.499 m).   Weight as of this encounter: 192 lb (87.1 kg). Ideal: Patient must be at least 60 in tall to calculate ideal body weight  Assessment   Diagnosis Status  1. Fibromyalgia   2. Chronic pain syndrome   3. Myofascial pain syndrome of lumbar spine   4. Thoracic myelopathy (due to syrinx, s/p excision 12/23/2017)    Controlled Controlled Controlled   Updated Problems: No problems updated.  Plan of Care     Kristin Porter has a current medication list which includes the following long-term medication(s): fluticasone , pantoprazole , pantoprazole , paroxetine , and pregabalin .  Pharmacotherapy (Medications Ordered): Meds ordered this encounter  Medications   pregabalin  (LYRICA ) 100 MG capsule    Sig: Take 1-2 capsules (100-200 mg total) by mouth 2 (two) times daily.    Dispense:  60 capsule    Refill:  5    Fill one day early if pharmacy is  closed on scheduled refill date. May substitute for generic if available.   meloxicam  (MOBIC ) 15 MG tablet    Sig: Take 1 tablet (15 mg total) by mouth daily as needed for pain.    Dispense:  90 tablet    Refill:  1   Orders:  No orders of the defined types were placed in this encounter.  Follow-up plan:   Return in about 6 months  (around 11/18/2023) for MM, F2F.     Recent Visits Date Type Provider Dept  02/21/23 Office Visit Marcelino Nurse, MD [facility]  Showing recent visits within past 90 days and meeting all other requirements Today's Visits Date Type Provider Dept  05/21/23 Office Visit Marcelino Nurse, MD [facility]  Showing today's visits and meeting all other requirements Future Appointments No visits were found meeting these conditions. Showing future appointments within next 90 days and meeting all other requirements  I discussed the assessment and treatment plan with the patient. The patient was provided an opportunity to ask questions and all were answered. The patient agreed with the plan and demonstrated an understanding of the instructions.  Patient advised to call back or seek an in-person evaluation if the symptoms or condition worsens.  Duration of encounter: 20 minutes.  Total time on encounter, as per AMA guidelines included both the face-to-face and non-face-to-face time personally spent by the physician and/or other qualified health care professional(s) on the day of the encounter (includes time in activities that require the physician or other qualified health care professional and does not include time in activities normally performed by clinical staff). Physician's time may include the following activities when performed: Preparing to see the patient (e.g., pre-charting review of records, searching for previously ordered imaging, lab work, and nerve conduction tests) Review of prior analgesic pharmacotherapies. Reviewing PMP Interpreting ordered tests (e.g., lab work, imaging, nerve conduction tests) Performing post-procedure evaluations, including interpretation of diagnostic procedures Obtaining and/or reviewing separately obtained history Performing a medically appropriate examination and/or evaluation Counseling and educating the patient/family/caregiver Ordering  medications, tests, or procedures Referring and communicating with other health care professionals (when not separately reported) Documenting clinical information in the electronic or other health record Independently interpreting results (not separately reported) and communicating results to the patient/ family/caregiver Care coordination (not separately reported)  Note by: Nurse Marcelino, MD Date: 05/21/2023; Time: 12:56 PM

## 2023-05-23 ENCOUNTER — Encounter: Payer: 59 | Admitting: Student in an Organized Health Care Education/Training Program

## 2023-06-15 ENCOUNTER — Other Ambulatory Visit: Payer: Self-pay | Admitting: Physician Assistant

## 2023-06-18 ENCOUNTER — Encounter: Payer: Self-pay | Admitting: Physician Assistant

## 2023-06-18 ENCOUNTER — Other Ambulatory Visit: Payer: Self-pay | Admitting: Physician Assistant

## 2023-06-18 ENCOUNTER — Telehealth: Payer: Self-pay

## 2023-06-18 DIAGNOSIS — K219 Gastro-esophageal reflux disease without esophagitis: Secondary | ICD-10-CM

## 2023-06-18 MED ORDER — PANTOPRAZOLE SODIUM 40 MG PO TBEC: 40.0000 mg | DELAYED_RELEASE_TABLET | Freq: Every day | ORAL | 1 refills | Status: DC

## 2023-06-18 MED ORDER — PANTOPRAZOLE SODIUM 40 MG PO TBEC: 40.0000 mg | DELAYED_RELEASE_TABLET | Freq: Every day | ORAL | 1 refills | Status: AC

## 2023-06-18 NOTE — Progress Notes (Signed)
Med refill.  Decrease pantoprazole to 40 mg once daily dose.

## 2023-06-18 NOTE — Addendum Note (Signed)
Addended by: Martyn Ehrich on: 06/18/2023 08:40 AM   Modules accepted: Orders

## 2023-06-18 NOTE — Telephone Encounter (Signed)
Left message to return call to office.  Call and notify pt. He needs to decrease Pantoprazole to 40mg  1 tablet once daily long term for safety reasons.  OK to refill Once daily dose, #30, 1 RF.  I also recommend f/u OV. Celso Amy, PA-C

## 2023-06-18 NOTE — Telephone Encounter (Signed)
Spoke with patient- Pantoprazole sent to pharmacy.  Call and notify pt. He needs to decrease Pantoprazole to 40mg  1 tablet once daily long term for safety reasons.  OK to refill Once daily dose, #30, 1 RF.  I also recommend f/u OV.  Celso Amy, PA-C

## 2023-07-29 ENCOUNTER — Other Ambulatory Visit: Payer: Self-pay | Admitting: Student in an Organized Health Care Education/Training Program

## 2023-07-29 DIAGNOSIS — M797 Fibromyalgia: Secondary | ICD-10-CM

## 2023-07-29 DIAGNOSIS — M4714 Other spondylosis with myelopathy, thoracic region: Secondary | ICD-10-CM

## 2023-07-29 DIAGNOSIS — G894 Chronic pain syndrome: Secondary | ICD-10-CM

## 2023-08-06 ENCOUNTER — Other Ambulatory Visit: Payer: Self-pay

## 2023-08-06 ENCOUNTER — Emergency Department (HOSPITAL_BASED_OUTPATIENT_CLINIC_OR_DEPARTMENT_OTHER): Admitting: Radiology

## 2023-08-06 ENCOUNTER — Encounter (HOSPITAL_BASED_OUTPATIENT_CLINIC_OR_DEPARTMENT_OTHER): Payer: Self-pay | Admitting: Emergency Medicine

## 2023-08-06 ENCOUNTER — Emergency Department (HOSPITAL_BASED_OUTPATIENT_CLINIC_OR_DEPARTMENT_OTHER)
Admission: EM | Admit: 2023-08-06 | Discharge: 2023-08-06 | Disposition: A | Discharge status: Home / Self Care | Attending: Emergency Medicine | Admitting: Emergency Medicine

## 2023-08-06 DIAGNOSIS — Z7982 Long term (current) use of aspirin: Secondary | ICD-10-CM | POA: Insufficient documentation

## 2023-08-06 DIAGNOSIS — G894 Chronic pain syndrome: Secondary | ICD-10-CM | POA: Diagnosis not present

## 2023-08-06 DIAGNOSIS — M25511 Pain in right shoulder: Secondary | ICD-10-CM | POA: Diagnosis present

## 2023-08-06 LAB — CBC WITH DIFFERENTIAL/PLATELET
Abs Immature Granulocytes: 0.01 10*3/uL (ref 0.00–0.07)
Basophils Absolute: 0 10*3/uL (ref 0.0–0.1)
Basophils Relative: 0 %
Eosinophils Absolute: 0.1 10*3/uL (ref 0.0–0.5)
Eosinophils Relative: 1 %
HCT: 41.6 % (ref 36.0–46.0)
Hemoglobin: 14 g/dL (ref 12.0–15.0)
Immature Granulocytes: 0 %
Lymphocytes Relative: 30 %
Lymphs Abs: 2.2 10*3/uL (ref 0.7–4.0)
MCH: 29.5 pg (ref 26.0–34.0)
MCHC: 33.7 g/dL (ref 30.0–36.0)
MCV: 87.6 fL (ref 80.0–100.0)
Monocytes Absolute: 0.6 10*3/uL (ref 0.1–1.0)
Monocytes Relative: 9 %
Neutro Abs: 4.2 10*3/uL (ref 1.7–7.7)
Neutrophils Relative %: 60 %
Platelets: 271 10*3/uL (ref 150–400)
RBC: 4.75 MIL/uL (ref 3.87–5.11)
RDW: 13 % (ref 11.5–15.5)
WBC: 7.1 10*3/uL (ref 4.0–10.5)
nRBC: 0 % (ref 0.0–0.2)

## 2023-08-06 LAB — BASIC METABOLIC PANEL
Anion gap: 7 (ref 5–15)
BUN: 11 mg/dL (ref 8–23)
CO2: 27 mmol/L (ref 22–32)
Calcium: 9.3 mg/dL (ref 8.9–10.3)
Chloride: 105 mmol/L (ref 98–111)
Creatinine, Ser: 0.56 mg/dL (ref 0.44–1.00)
GFR, Estimated: >60 mL/min (ref >60)
Glucose, Bld: 85 mg/dL (ref 70–99)
Potassium: 4.7 mmol/L (ref 3.5–5.1)
Sodium: 139 mmol/L (ref 135–145)

## 2023-08-06 LAB — TROPONIN I (HIGH SENSITIVITY): Troponin I (High Sensitivity): 2 ng/L (ref <18)

## 2023-08-06 MED ORDER — OXYCODONE-ACETAMINOPHEN 5-325 MG PO TABS: 1.0000 | ORAL_TABLET | Freq: Four times a day (QID) | ORAL | 0 refills | Status: DC | PRN

## 2023-08-06 MED ORDER — DIAZEPAM 5 MG/ML IJ SOLN
2.5000 mg | Freq: Once | INTRAMUSCULAR | Status: AC
  Administered 2023-08-06: 2.5 mg via INTRAVENOUS
  Filled 2023-08-06: qty 2

## 2023-08-06 MED ORDER — MORPHINE SULFATE (PF) 4 MG/ML IV SOLN
4.0000 mg | Freq: Once | INTRAVENOUS | Status: DC
  Filled 2023-08-06: qty 1

## 2023-08-06 MED ORDER — KETOROLAC TROMETHAMINE 15 MG/ML IJ SOLN
15.0000 mg | Freq: Once | INTRAMUSCULAR | Status: AC
  Administered 2023-08-06: 15 mg via INTRAVENOUS
  Filled 2023-08-06: qty 1

## 2023-08-06 NOTE — ED Triage Notes (Signed)
 C/o choric shoulder pain x 3 weeks. Hx of fibromyalgia. States pain has gotten worse of the last two days.  Took Lyrica and muscle relaxer last night,

## 2023-08-06 NOTE — ED Provider Notes (Signed)
 [place] EMERGENCY DEPARTMENT AT [facility] Provider Note   CSN: [id number] Arrival date & time: 08/06/23  1546    History  Chief Complaint  Patient presents with   Shoulder Pain    Kristin Porter is a 63 y.o. female here for evaluation of bilateral shoulder pain.  She has a history of fibromyalgia.  Typically she has pain in her back and lower extremities.  No chest pain, shortness of breath.  No numbness or weakness.  No headache.  No recent falls or injuries.  No abdominal pain, nausea or vomiting.  No swelling to extremities.  Typically takes tramadol, Lyrica, muscle relaxers for pain  HPI     Home Medications Prior to Admission medications   Medication Sig Start Date End Date Taking? Authorizing Provider  oxyCODONE-acetaminophen (PERCOCET/ROXICET) 5-325 MG tablet Take 1 tablet by mouth every 6 (six) hours as needed for severe pain (pain score 7-10). 08/06/23  Yes Jahan Friedlander A, PA-C  aspirin EC 81 MG tablet Take 1 tablet (81 mg total) by mouth daily. Swallow whole. Patient taking differently: Take 81 mg by mouth 3 (three) times a week. Swallow whole. 10/05/22   Lorre Munroe, NP  baclofen (LIORESAL) 10 MG tablet Take 1 tablet (10 mg total) by mouth 4 (four) times daily as needed for muscle spasms. 02/21/23   Edward Jolly, MD  Blood Pressure Monitoring (BLOOD PRESSURE KIT) DEVI 1 Units by Does not apply route in the morning and at bedtime. 08/20/19   Malfi, Jodelle Gross, FNP  butalbital-acetaminophen-caffeine (FIORICET) [phone] MG tablet Take 1 tablet by mouth every 6 (six) hours as needed for headache. 10/05/22   Lorre Munroe, NP  fluticasone (FLONASE) 50 MCG/ACT nasal spray Place 1 spray into both nostrils 2 (two) times daily. Patient taking differently: Place 1 spray into both nostrils as needed. 06/25/22   Lorre Munroe, NP  meloxicam (MOBIC) 15 MG tablet Take 1 tablet (15 mg total) by mouth daily as needed for pain. 05/21/23   Edward Jolly, MD   pantoprazole (PROTONIX) 40 MG tablet Take 1 tablet (40 mg total) by mouth daily. 06/18/23   Celso Amy, PA-C  PARoxetine (PAXIL) 10 MG tablet Take 1 tablet (10 mg total) by mouth daily. 04/11/23   Lorre Munroe, NP  pregabalin (LYRICA) 100 MG capsule Take 1-2 capsules (100-200 mg total) by mouth 2 (two) times daily. 05/21/23   Edward Jolly, MD  Suvorexant (BELSOMRA) 5 MG TABS Take 1 tablet (5 mg total) by mouth at bedtime. 10/05/22   Lorre Munroe, NP  triamcinolone cream (KENALOG) 0.1 % Apply 1 application topically 2 (two) times daily. Patient taking differently: Apply 1 application  topically as needed. 12/12/20   Lorre Munroe, NP      Allergies    Tizanidine, Lisinopril, and Tramadol    Review of Systems   Review of Systems  Constitutional: Negative.   HENT: Negative.    Respiratory: Negative.    Cardiovascular: Negative.   Gastrointestinal: Negative.   Genitourinary: Negative.   Musculoskeletal:        Bilateral shoulder pain  Skin: Negative.   Neurological: Negative.   All other systems reviewed and are negative.   Physical Exam Updated Vital Signs BP (!) 153/84   Pulse 64   Temp 98.1 F (36.7 C) (Oral)   Resp 11   SpO2 99%  Physical Exam Vitals and nursing note reviewed.  Constitutional:      General: She is not in acute distress.  Appearance: She is well-developed. She is not ill-appearing, toxic-appearing or diaphoretic.  HENT:     Head: Normocephalic and atraumatic.     Nose: Nose normal.     Mouth/Throat:     Mouth: Mucous membranes are moist.  Eyes:     Pupils: Pupils are equal, round, and reactive to light.  Cardiovascular:     Rate and Rhythm: Normal rate.     Pulses: Normal pulses.          Radial pulses are 2+ on the right side and 2+ on the left side.       Dorsalis pedis pulses are 2+ on the right side and 2+ on the left side.     Heart sounds: Normal heart sounds.  Pulmonary:     Effort: Pulmonary effort is normal. No respiratory  distress.     Breath sounds: Normal breath sounds.  Abdominal:     General: Bowel sounds are normal. There is no distension.     Palpations: Abdomen is soft.     Tenderness: There is no abdominal tenderness. There is no right CVA tenderness, left CVA tenderness, guarding or rebound.  Musculoskeletal:        General: Tenderness present. No swelling, deformity or signs of injury. Normal range of motion.     Cervical back: Normal range of motion.     Right lower leg: No edema.     Left lower leg: No edema.     Comments: Diffuse tenderness bilateral shoulders. Neg empty can. No midline C/T/L tenderness.  Compartment soft, full range of motion  Skin:    General: Skin is warm and dry.     Capillary Refill: Capillary refill takes less than 2 seconds.     Comments: No edema, erythema or warmth  Neurological:     General: No focal deficit present.     Mental Status: She is alert.  Psychiatric:        Mood and Affect: Mood normal.    ED Results / Procedures / Treatments   Labs (all labs ordered are listed, but only abnormal results are displayed) Labs Reviewed  CBC WITH DIFFERENTIAL/PLATELET  BASIC METABOLIC PANEL  TROPONIN I (HIGH SENSITIVITY)    EKG None  Radiology DG Chest 2 View Result Date: 08/06/2023 CLINICAL DATA:  Pain EXAM: CHEST - 2 VIEW COMPARISON:  None Available. FINDINGS: The heart size and mediastinal contours are within normal limits. Both lungs are clear. The visualized skeletal structures are unremarkable. IMPRESSION: No active cardiopulmonary disease. Electronically Signed   By: Charlett Nose M.D.   On: 08/06/2023 22:42    Procedures Procedures    Medications Ordered in ED Medications  ketorolac (TORADOL) 15 MG/ML injection 15 mg (15 mg Intravenous Given 08/06/23 2218)  diazepam (VALIUM) injection 2.5 mg (2.5 mg Intravenous Given 08/06/23 2218)    ED Course/ Medical Decision Making/ A&P   64 year old here for evaluation of bilateral shoulder pain.  Pain over  the last few days.  No recent injury or trauma.  Fibromyalgia and typically has joint pain.  No chest pain, midline back pain, no numbness or weakness.  No swelling or redness to joints.  No recent tick bites, fever, rashes or lesions.  Heart and lungs clear.  Abdomen soft, tender.  No erythema, obvious joint swelling.    Labs and imaging personally viewed and interpreted:  CBC without leukocytosis metabolic panel without significant abnormality Troponin 2 EKG without ischemic changes Chest x-ray without acute abnormality  Patient requesting pain medication  that she was given to in [facility] ED which consisted of Toradol and Valium  Patient reassessed.  Symptoms improved.  We discussed her labs and imaging.  Likely acute on chronic fibromyalgia pain.  At this time low suspicion for acute ACS, PE, dissection, septic joint, gout, hemarthrosis, fracture, dislocation, radiculopathy  The patient has been appropriately medically screened and/or stabilized in the ED. I have low suspicion for any other emergent medical condition which would require further screening, evaluation or treatment in the ED or require inpatient management.  Patient is hemodynamically stable and in no acute distress.  Patient able to ambulate in department prior to ED.  Evaluation does not show acute pathology that would require ongoing or additional emergent interventions while in the emergency department or further inpatient treatment.  I have discussed the diagnosis with the patient and answered all questions.  Pain is been managed while in the emergency department and patient has no further complaints prior to discharge.  Patient is comfortable with plan discussed in room and is stable for discharge at this time.  I have discussed strict return precautions for returning to the emergency department.  Patient was encouraged to follow-up with PCP/specialist refer to at discharge.                                 Medical Decision  Making Amount and/or Complexity of Data Reviewed Independent Historian: friend External Data Reviewed: labs, radiology, ECG and notes. Labs: ordered. Decision-making details documented in ED Course. Radiology: ordered and independent interpretation performed. Decision-making details documented in ED Course. ECG/medicine tests: ordered and independent interpretation performed. Decision-making details documented in ED Course.  Risk OTC drugs. Prescription drug management. Parenteral controlled substances. Decision regarding hospitalization. Diagnosis or treatment significantly limited by social determinants of health.         Final Clinical Impression(s) / ED Diagnoses Final diagnoses:  Chronic pain syndrome    Rx / DC Orders ED Discharge Orders          Ordered    oxyCODONE-acetaminophen (PERCOCET/ROXICET) 5-325 MG tablet  Every 6 hours PRN        08/06/23 2323              Tymeshia Awan A, PA-C 08/06/23 2333    Rolan Bucco, MD 08/06/23 2357

## 2023-08-06 NOTE — Discharge Instructions (Signed)
 Do NOT take the tramadol at home while taking the Percocet  Make sure to follow-up outpatient, return for any worsening symptoms

## 2023-08-20 HISTORY — PX: ANKLE SURGERY: SHX546

## 2023-08-30 IMAGING — MG MM BREAST LOCALIZATION CLIP
4 series · 4 of 12 positions shown · non-contrast
Comparison: Previous exam(s).

CLINICAL DATA: Evaluate biopsy marker

EXAM:
3D DIAGNOSTIC LEFT MAMMOGRAM POST ULTRASOUND BIOPSY

[L CC synth-2D]
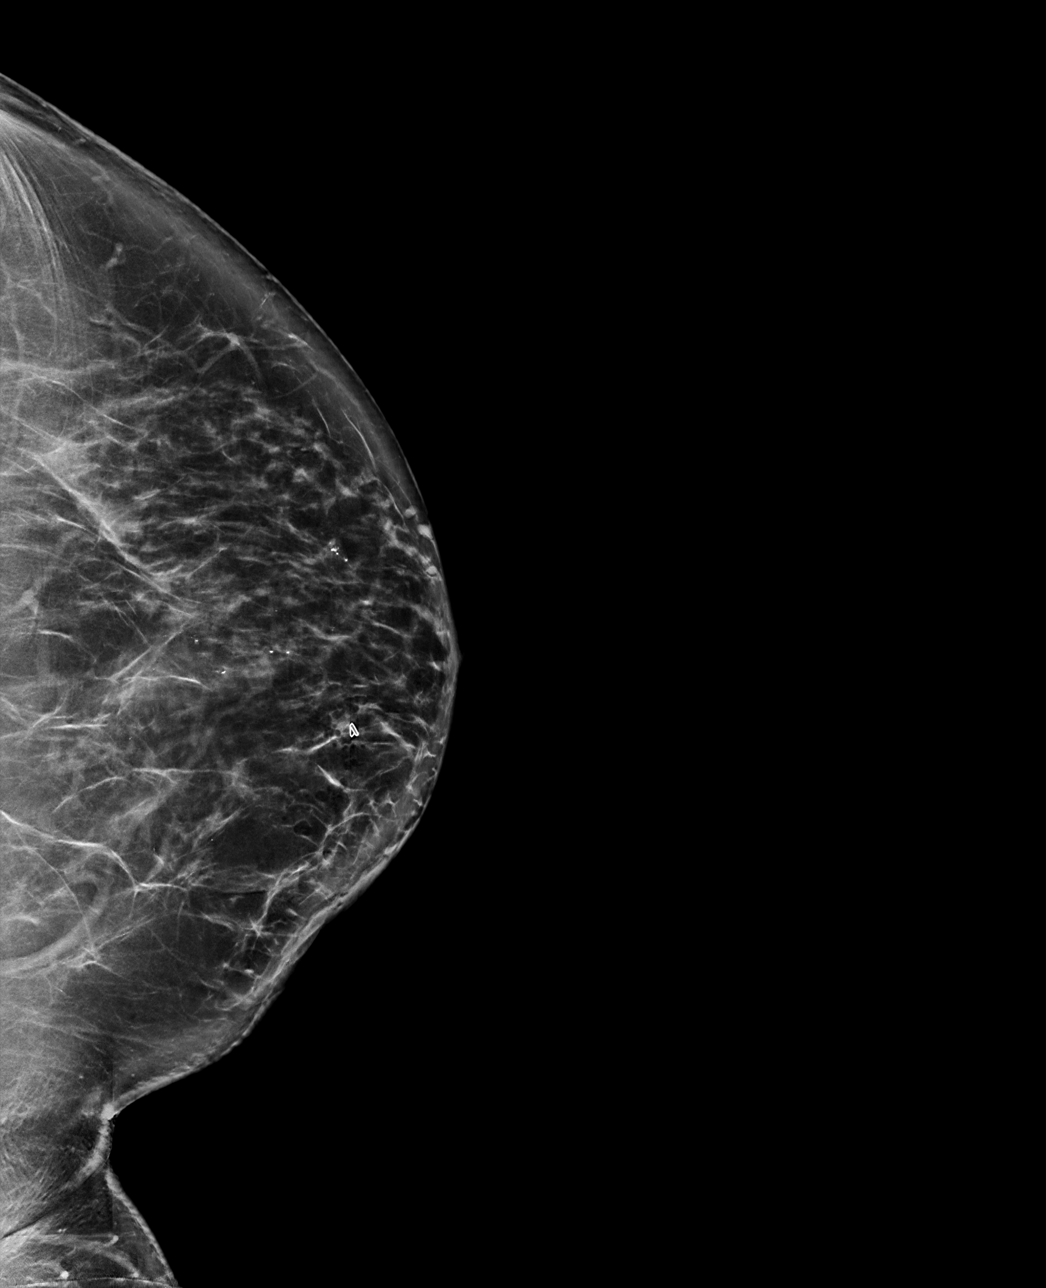

[L ML synth-2D]
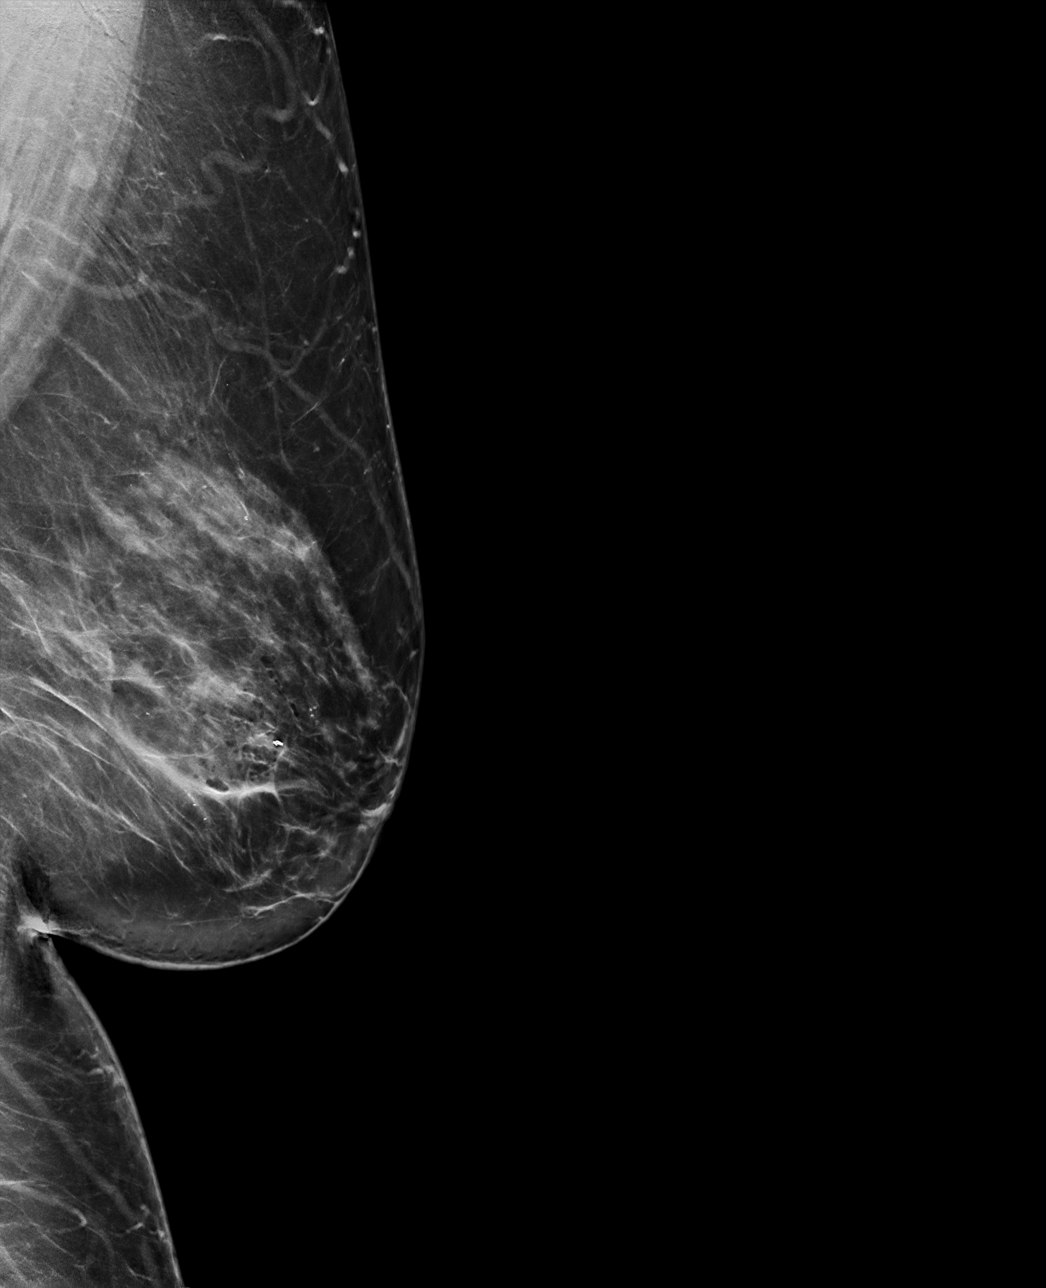

[L CC tomo · tomo slice 43/84.0]
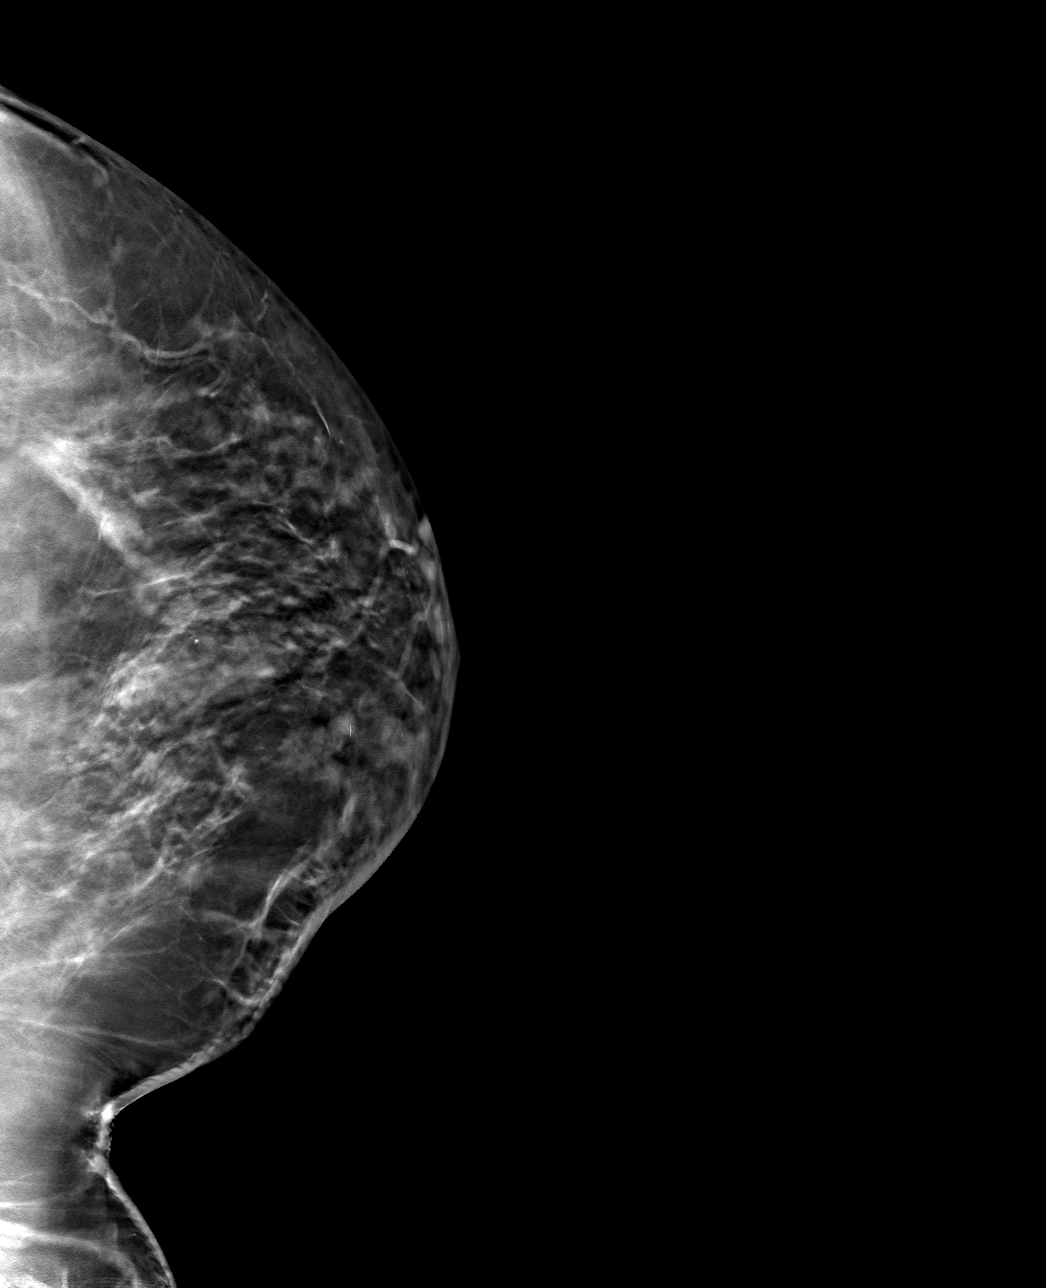

[L ML tomo · tomo slice 49/96.0]
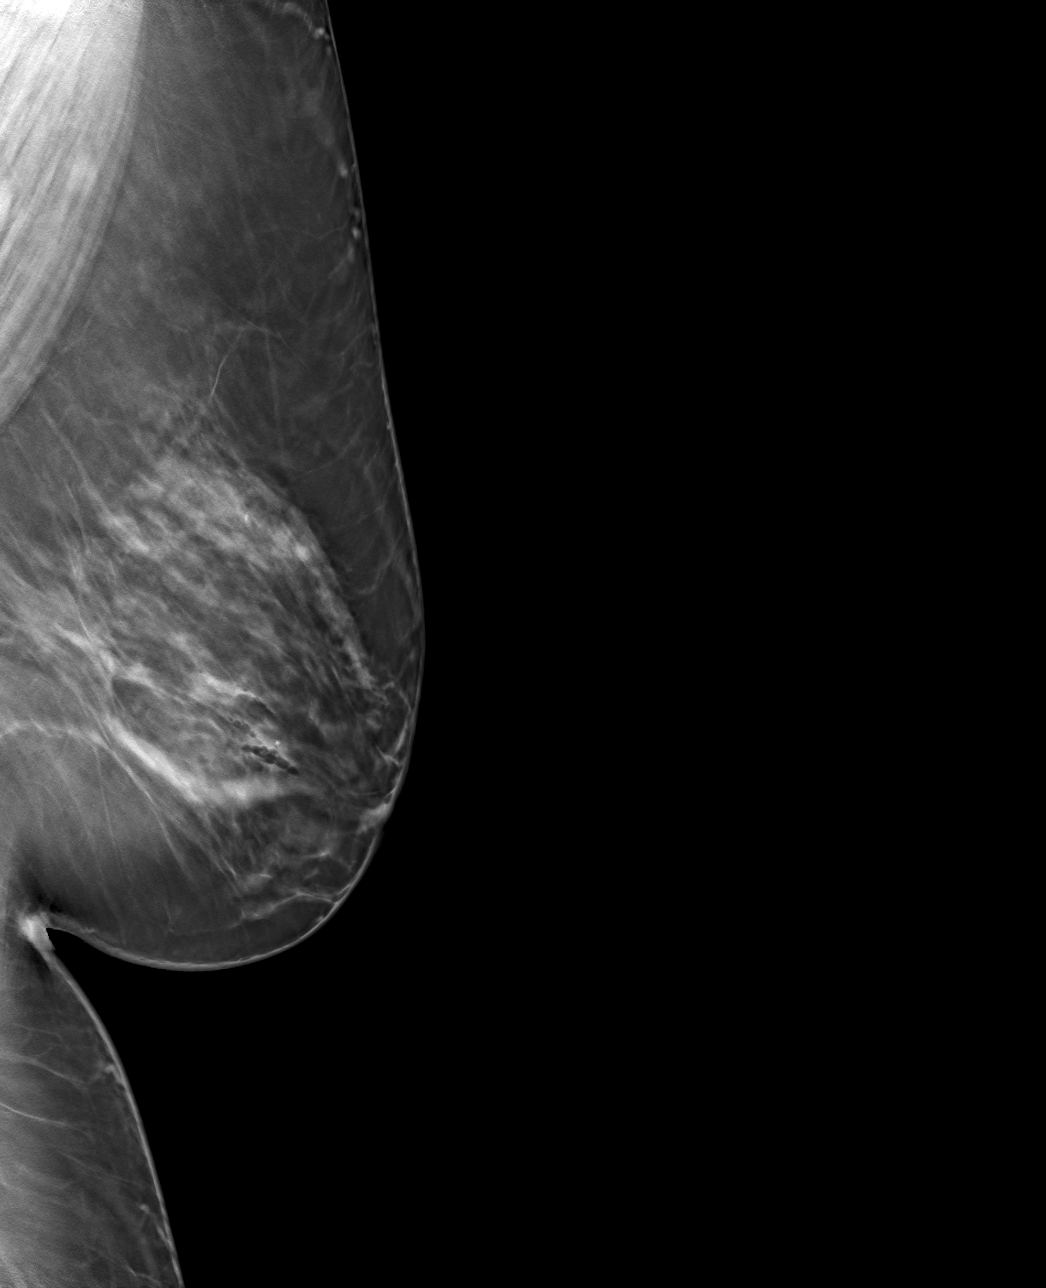

[4 of 12 positions shown; findings below may reference images not displayed]

FINDINGS: 3D Mammographic images were obtained following ultrasound guided
biopsy of a left breast mass. The biopsy marking clip is in expected
position at the site of biopsy.
IMPRESSION: Appropriate positioning of the heart shaped biopsy marking clip at
the site of biopsy in the location of the biopsied left breast mass.

Final Assessment: Post Procedure Mammograms for Marker Placement

## 2023-09-03 ENCOUNTER — Other Ambulatory Visit: Payer: Self-pay | Admitting: Internal Medicine

## 2023-09-03 DIAGNOSIS — Z1231 Encounter for screening mammogram for malignant neoplasm of breast: Secondary | ICD-10-CM

## 2023-09-13 DIAGNOSIS — S46919A Strain of unspecified muscle, fascia and tendon at shoulder and upper arm level, unspecified arm, initial encounter: Secondary | ICD-10-CM | POA: Insufficient documentation

## 2023-09-17 ENCOUNTER — Encounter

## 2023-10-07 ENCOUNTER — Other Ambulatory Visit: Payer: Self-pay | Admitting: Physician Assistant

## 2023-10-07 DIAGNOSIS — K219 Gastro-esophageal reflux disease without esophagitis: Secondary | ICD-10-CM

## 2023-10-10 ENCOUNTER — Encounter: Payer: Self-pay | Admitting: Internal Medicine

## 2023-10-10 ENCOUNTER — Ambulatory Visit (INDEPENDENT_AMBULATORY_CARE_PROVIDER_SITE_OTHER): Payer: Self-pay | Admitting: Internal Medicine

## 2023-10-10 VITALS — BP 132/90 | Ht 59.0 in | Wt 202.4 lb

## 2023-10-10 DIAGNOSIS — E782 Mixed hyperlipidemia: Secondary | ICD-10-CM | POA: Diagnosis not present

## 2023-10-10 DIAGNOSIS — I700 Atherosclerosis of aorta: Secondary | ICD-10-CM | POA: Diagnosis not present

## 2023-10-10 DIAGNOSIS — F5101 Primary insomnia: Secondary | ICD-10-CM

## 2023-10-10 DIAGNOSIS — M25552 Pain in left hip: Secondary | ICD-10-CM

## 2023-10-10 DIAGNOSIS — M797 Fibromyalgia: Secondary | ICD-10-CM

## 2023-10-10 DIAGNOSIS — G8929 Other chronic pain: Secondary | ICD-10-CM

## 2023-10-10 DIAGNOSIS — M8588 Other specified disorders of bone density and structure, other site: Secondary | ICD-10-CM

## 2023-10-10 DIAGNOSIS — R519 Headache, unspecified: Secondary | ICD-10-CM

## 2023-10-10 DIAGNOSIS — K219 Gastro-esophageal reflux disease without esophagitis: Secondary | ICD-10-CM

## 2023-10-10 DIAGNOSIS — I10 Essential (primary) hypertension: Secondary | ICD-10-CM

## 2023-10-10 DIAGNOSIS — M25551 Pain in right hip: Secondary | ICD-10-CM

## 2023-10-10 DIAGNOSIS — M47817 Spondylosis without myelopathy or radiculopathy, lumbosacral region: Secondary | ICD-10-CM

## 2023-10-10 DIAGNOSIS — Z6841 Body Mass Index (BMI) 40.0 and over, adult: Secondary | ICD-10-CM

## 2023-10-10 DIAGNOSIS — R7303 Prediabetes: Secondary | ICD-10-CM | POA: Diagnosis not present

## 2023-10-10 DIAGNOSIS — G894 Chronic pain syndrome: Secondary | ICD-10-CM

## 2023-10-10 MED ORDER — BUTALBITAL-APAP-CAFFEINE 50-325-40 MG PO TABS: 1.0000 | ORAL_TABLET | Freq: Four times a day (QID) | ORAL | 0 refills | Status: AC | PRN

## 2023-10-10 MED ORDER — BELSOMRA 10 MG PO TABS: 10.0000 mg | ORAL_TABLET | Freq: Every evening | ORAL | 0 refills | Status: AC | PRN

## 2023-10-10 NOTE — Assessment & Plan Note (Signed)
 Encouraged diet and exercise for weight loss ?

## 2023-10-10 NOTE — Assessment & Plan Note (Signed)
 Continue tramadol , meloxicam , baclofen  and pregabalin  as prescribed by pain management

## 2023-10-10 NOTE — Assessment & Plan Note (Signed)
 C-Met and lipid profile today Discussed possibly restarting Repatha  injections based on labs Encouraged her to take aspirin  daily Reinforced low-fat diet

## 2023-10-10 NOTE — Assessment & Plan Note (Signed)
 C-Met and lipid profile today Encouraged her to consume a low-fat diet Discussed restarting Repatha  injections if needed based on labs

## 2023-10-10 NOTE — Assessment & Plan Note (Signed)
 Avoid foods that trigger your reflux Encouraged weight loss as this can help reduce reflux symptoms Continue omeprazole  and tums as needed

## 2023-10-10 NOTE — Progress Notes (Signed)
 Subjective:    Patient ID: Kristin Porter, female    DOB: [birth year], 63 y.o.   MRN: [id number]  HPI  Patient presents to clinic today for 40-month follow-up of chronic conditions.  HTN: Her BP today is 138/92.  She is not taking any antihypertensive medication at this time.  ECG from 07/2023 reviewed.  Headaches: These occur every other day.  She is not sure what triggers this. She takes fioricet as needed with some relief of symptoms.  She does not follow with neurology.  GERD: Triggered by hiatal hernia.  She has occasional breakthrough on omeprazole  for which she takes tums with good relief of symptoms.  Upper GI from 02/2021 reviewed.  Osteopenia: She is not taking any calcium  or vitamin d OTC.  She does not get weightbearing exercise daily.  Bone density from 09/2019 reviewed.  HLD with aortic atherosclerosis: Her last LDL was 145, triglycerides 118, 03/2023.  She is no longer taking repatha  as prescribed.  She is not taking aspirin  daily.  She tries to consume a low-fat diet.  She follows with cardiology.  Prediabetes: Her last A1c was 5.9%, 03/2023.  She is not taking any oral diabetic medication at this time.  She does not check her sugars.  Depression: Chronic, managed on paroxetine  only as needed.  She is not currently seeing a therapist.  She denies anxiety, SI/HI.  Insomnia: She has difficulty falling and staying asleep.  She is taking belsomra  as prescribed but would like to increase the dose of if possible.  There is no sleep study on file.  Chronic pain/fibromyalgia: Managed with tramadol , meloxicam , baclofen  and pregabalin .  She follows with pain management.  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 Swallow whole. (Patient taking differently: Take 81 mg by mouth 3 (three) times a week. Swallow whole.) 30 tablet 12   baclofen  (LIORESAL ) 10 MG tablet Take 1 tablet (10 mg total) by mouth 4 (four) times daily as needed for muscle spasms. 120 each 5   Blood Pressure Monitoring (BLOOD PRESSURE KIT) DEVI 1 Units by Does not apply route in the morning and at bedtime. 1 each 0   butalbital -acetaminophen -caffeine  (FIORICET) 50-325-40 MG tablet Take 1 tablet by mouth every 6 (six) hours as needed for headache. 14 tablet 0   fluticasone  (FLONASE ) 50 MCG/ACT nasal spray Place 1 spray into both nostrils 2 (two) times daily. (Patient taking differently: Place 1 spray into both nostrils as needed.) 9.9 mL 1   meloxicam  (MOBIC ) 15 MG tablet Take 1 tablet (15 mg total) by mouth daily as needed for pain. 90 tablet 1   oxyCODONE -acetaminophen  (PERCOCET/ROXICET) 5-325 MG tablet Take 1 tablet by mouth every 6 (six) hours as needed for severe pain (pain score 7-10). 15 tablet 0   pantoprazole  (PROTONIX ) 40 MG tablet Take 1 tablet (40 mg total) by mouth daily. 90 tablet 1   PARoxetine  (PAXIL ) 10 MG tablet Take 1 tablet (10 mg total)  by mouth daily. 90 tablet 1   pregabalin  (LYRICA ) 100 MG capsule Take 1-2 capsules (100-200 mg total) by mouth 2 (two) times daily. 60 capsule 5   Suvorexant  (BELSOMRA ) 5 MG TABS Take 1 tablet (5 mg total) by mouth at bedtime. 30 tablet 0   triamcinolone  cream (KENALOG ) 0.1 % Apply 1 application topically 2 (two) times daily. (Patient taking differently: Apply 1 application  topically as needed.) 30 g 0   No current facility-administered medications for this visit.    Allergies  Allergen Reactions   Tizanidine Other (See Comments)    Throat closing and trouble breathing    Lisinopril Cough   Tramadol  Itching     Can take with Benadryl    Family History  Family history unknown: Yes    Social History   Socioeconomic History   Marital status: Divorced    Spouse name: Not on file   Number of children: Not on file   Years of education: Not on file   Highest education level: 11th grade  Occupational History   Occupation: disability   Tobacco Use   Smoking status: Former    Current packs/day: 0.00    Types: Cigarettes    Quit date: 1970    Years since quitting: 55.4    Passive exposure: Past   Smokeless tobacco: Never  Vaping Use   Vaping status: Never Used  Substance and Sexual Activity   Alcohol use: Not Currently   Drug use: Never   Sexual activity: Not Currently  Other Topics Concern   Not on file  Social History Narrative   Lives alone   Social Drivers of Health   Financial Resource Strain: Low Risk  (04/08/2023)   Overall Financial Resource Strain (CARDIA)    Difficulty of Paying Living Expenses: Not hard at all  Food Insecurity: No Food Insecurity (04/08/2023)   Hunger Vital Sign    Worried About Running Out of Food in the Last Year: Never true    Ran Out of Food in the Last Year: Never true  Transportation Needs: No Transportation Needs (04/08/2023)   PRAPARE - Administrator, Civil Service (Medical): No    Lack of Transportation (Non-Medical): No  Physical Activity: Inactive (04/08/2023)   Exercise Vital Sign    Days of Exercise per Week: 0 days    Minutes of Exercise per Session: 20 min  Stress: No Stress Concern Present (04/08/2023)   [employer] of Occupational Health - Occupational Stress Questionnaire    Feeling of Stress : Only a little  Social Connections: Socially Isolated (04/08/2023)   Social Connection and Isolation Panel [NHANES]    Frequency of Communication with Friends and Family: More than three times a week    Frequency of Social Gatherings with Friends and Family: Twice a week    Attends Religious Services: Never    Automotive engineer or Organizations: No    Attends Engineer, structural: Not on file    Marital Status: Divorced  Intimate Partner Violence: Not At Risk (10/26/2022)   Humiliation, Afraid, Rape, and Kick questionnaire    Fear of Current or Ex-Partner: No    Emotionally Abused: No    Physically Abused: No    Sexually Abused: No     Constitutional: Patient reports intermittent headaches.  Denies fever, malaise, fatigue, or abrupt weight changes.  HEENT: Denies eye pain, eye redness, ear pain, ringing in the ears, wax buildup, runny nose, nasal congestion, bloody nose, or sore  throat. Respiratory: Denies difficulty breathing, shortness of breath, cough or sputum production.   Cardiovascular: Denies chest pain, chest tightness, palpitations or swelling in the hands or feet.  Gastrointestinal: Pt reports intermittent constipation. Denies abdominal pain, bloating, diarrhea or blood in the stool.  GU: Denies urgency, frequency, pain with urination, burning sensation, blood in urine, odor or discharge. Musculoskeletal: Patient reports chronic joint, muscle pain difficulty with gait.  Denies decrease in range of motion, or joint swelling.  Skin: Denies redness, rashes, lesions or ulcercations.  Neurological: Patient reports insomnia.  Denies dizziness, difficulty with memory, difficulty with speech or problems with balance and coordination.  Psych: Patient has a history of depression.  Denies anxiety, SI/HI.  No other specific complaints in a complete review of systems (except as listed in HPI above).  Objective:   Physical Exam  BP (!) 132/90   Ht 4\' 11"  (1.499 m)   Wt 202 lb 6.4 oz (91.8 kg)   BMI 40.88 kg/m    Wt Readings from Last 3 Encounters:  05/21/23 192 lb (87.1 kg)  04/11/23 190 lb (86.2 kg)  03/05/23 193 lb (87.5 kg)    General: Appears her stated age, obese in NAD. Skin: Warm, dry and intact.  Lipoma noted of bilateral ankles, R >L. HEENT: Head: normal shape and  size; Eyes: sclera white, no icterus, conjunctiva pink, PERRLA and EOMs intact;  Cardiovascular: Normal rate and rhythm. S1,S2 noted.  No murmur, rubs or gallops noted. No JVD or BLE edema. No carotid bruits noted. Pulmonary/Chest: Normal effort and positive vesicular breath sounds. No respiratory distress. No wheezes, rales or ronchi noted.  Abdomen: Soft and nontender. Normal bowel sounds.  Musculoskeletal: Joint enlargement noted in knees. Gait slow and steady with use of cane. Neurological: Alert and oriented. Coordination normal.  Psychiatric: Mood and affect normal. Behavior is normal. Judgment and thought content normal.     BMET    Component Value Date/Time   NA 139 08/06/2023 2157   K 4.7 08/06/2023 2157   CL 105 08/06/2023 2157   CO2 27 08/06/2023 2157   GLUCOSE 85 08/06/2023 2157   BUN 11 08/06/2023 2157   CREATININE 0.56 08/06/2023 2157   CREATININE 0.65 04/11/2023 1427   CALCIUM  9.3 08/06/2023 2157   GFRNONAA >60 08/06/2023 2157   GFRNONAA 92 03/22/2020 0911   GFRAA 107 03/22/2020 0911    Lipid Panel     Component Value Date/Time   CHOL 211 (H) 04/11/2023 1427   TRIG 118 04/11/2023 1427   HDL 42 (L) 04/11/2023 1427   CHOLHDL 5.0 (H) 04/11/2023 1427   VLDL 17 09/08/2020 0914   LDLCALC 145 (H) 04/11/2023 1427    CBC    Component Value Date/Time   WBC 7.1 08/06/2023 2157   RBC 4.75 08/06/2023 2157   HGB 14.0 08/06/2023 2157   HCT 41.6 08/06/2023 2157   PLT 271 08/06/2023 2157   MCV 87.6 08/06/2023 2157   MCH 29.5 08/06/2023 2157   MCHC 33.7 08/06/2023 2157   RDW 13.0 08/06/2023 2157   LYMPHSABS 2.2 08/06/2023 2157   MONOABS 0.6 08/06/2023 2157   EOSABS 0.1 08/06/2023 2157   BASOSABS 0.0 08/06/2023 2157    Hgb A1C Lab Results  Component Value Date   HGBA1C 5.9 (H) 04/11/2023           Assessment & Plan:     RTC in 6 months for your annual exam Helayne Lo, NP

## 2023-10-10 NOTE — Patient Instructions (Signed)

## 2023-10-10 NOTE — Assessment & Plan Note (Signed)
Encouraged her to start taking calcium and vitamin D OTC Encouraged daily weightbearing exercise

## 2023-10-10 NOTE — Assessment & Plan Note (Signed)
 Uncontrolled off meds Reinforced DASH diet and exercise for weight loss Will discuss antihypertensive therapy if remains elevated at next visit

## 2023-10-10 NOTE — Assessment & Plan Note (Signed)
 A1c today Encourage low-carb diet and exercise for weight loss

## 2023-10-10 NOTE — Assessment & Plan Note (Signed)
 Continue fioricet as needed, refilled today

## 2023-10-10 NOTE — Assessment & Plan Note (Signed)
 Will increase belsomra  to 10 mg daily

## 2023-10-11 ENCOUNTER — Ambulatory Visit: Payer: Self-pay | Admitting: Internal Medicine

## 2023-10-11 LAB — CBC
HCT: 42.6 % (ref 35.0–45.0)
Hemoglobin: 14.1 g/dL (ref 11.7–15.5)
MCH: 29.3 pg (ref 27.0–33.0)
MCHC: 33.1 g/dL (ref 32.0–36.0)
MCV: 88.6 fL (ref 80.0–100.0)
MPV: 10.2 fL (ref 7.5–12.5)
Platelets: 300 10*3/uL (ref 140–400)
RBC: 4.81 10*6/uL (ref 3.80–5.10)
RDW: 13.1 % (ref 11.0–15.0)
WBC: 6.4 10*3/uL (ref 3.8–10.8)

## 2023-10-11 LAB — COMPREHENSIVE METABOLIC PANEL WITH GFR
AG Ratio: 1.3 (calc) (ref 1.0–2.5)
ALT: 13 U/L (ref 6–29)
AST: 13 U/L (ref 10–35)
Albumin: 4.4 g/dL (ref 3.6–5.1)
Alkaline phosphatase (APISO): 56 U/L (ref 37–153)
BUN: 17 mg/dL (ref 7–25)
CO2: 25 mmol/L (ref 20–32)
Calcium: 9.7 mg/dL (ref 8.6–10.4)
Chloride: 103 mmol/L (ref 98–110)
Creat: 0.66 mg/dL (ref 0.50–1.05)
Globulin: 3.4 g/dL (ref 1.9–3.7)
Glucose, Bld: 95 mg/dL (ref 65–99)
Potassium: 4.5 mmol/L (ref 3.5–5.3)
Sodium: 137 mmol/L (ref 135–146)
Total Bilirubin: 0.3 mg/dL (ref 0.2–1.2)
Total Protein: 7.8 g/dL (ref 6.1–8.1)
eGFR: 97 mL/min/{1.73_m2} (ref >=60)

## 2023-10-11 LAB — LIPID PANEL
Cholesterol: 236 mg/dL — ABNORMAL HIGH (ref <200)
HDL: 51 mg/dL (ref >=50)
LDL Cholesterol (Calc): 161 mg/dL — ABNORMAL HIGH
Non-HDL Cholesterol (Calc): 185 mg/dL — ABNORMAL HIGH (ref <130)
Total CHOL/HDL Ratio: 4.6 (calc) (ref <5.0)
Triglycerides: 120 mg/dL (ref <150)

## 2023-10-11 LAB — HEMOGLOBIN A1C
Hgb A1c MFr Bld: 6 % — ABNORMAL HIGH (ref <5.7)
Mean Plasma Glucose: 126 mg/dL
eAG (mmol/L): 7 mmol/L

## 2023-11-01 ENCOUNTER — Ambulatory Visit: Payer: 59

## 2023-11-14 ENCOUNTER — Ambulatory Visit: Payer: 59 | Attending: Student in an Organized Health Care Education/Training Program | Admitting: Nurse Practitioner

## 2023-11-14 ENCOUNTER — Encounter: Payer: Self-pay | Admitting: Nurse Practitioner

## 2023-11-14 VITALS — BP 135/80 | HR 84 | Temp 97.9°F | Resp 16 | Ht 59.0 in | Wt 193.0 lb

## 2023-11-14 DIAGNOSIS — M4714 Other spondylosis with myelopathy, thoracic region: Secondary | ICD-10-CM | POA: Insufficient documentation

## 2023-11-14 DIAGNOSIS — Z0289 Encounter for other administrative examinations: Secondary | ICD-10-CM | POA: Insufficient documentation

## 2023-11-14 DIAGNOSIS — M797 Fibromyalgia: Secondary | ICD-10-CM | POA: Diagnosis present

## 2023-11-14 DIAGNOSIS — Z7189 Other specified counseling: Secondary | ICD-10-CM | POA: Diagnosis present

## 2023-11-14 DIAGNOSIS — Z79899 Other long term (current) drug therapy: Secondary | ICD-10-CM | POA: Insufficient documentation

## 2023-11-14 DIAGNOSIS — G894 Chronic pain syndrome: Secondary | ICD-10-CM | POA: Diagnosis present

## 2023-11-14 DIAGNOSIS — M7918 Myalgia, other site: Secondary | ICD-10-CM | POA: Diagnosis present

## 2023-11-14 MED ORDER — OXYCODONE-ACETAMINOPHEN 5-325 MG PO TABS: 1.0000 | ORAL_TABLET | Freq: Three times a day (TID) | ORAL | 0 refills | Status: AC | PRN

## 2023-11-14 MED ORDER — MELOXICAM 15 MG PO TABS
15.0000 mg | ORAL_TABLET | Freq: Every day | ORAL | 1 refills | Status: AC | PRN
Start: 2023-11-14 — End: ?

## 2023-11-14 MED ORDER — BACLOFEN 10 MG PO TABS: 10.0000 mg | ORAL_TABLET | Freq: Four times a day (QID) | ORAL | 5 refills | Status: DC | PRN

## 2023-11-14 MED ORDER — PREGABALIN 100 MG PO CAPS: 100.0000 mg | ORAL_CAPSULE | Freq: Two times a day (BID) | ORAL | 5 refills | Status: DC

## 2023-11-14 NOTE — Progress Notes (Signed)
 PROVIDER NOTE: Interpretation of information contained herein should be left to medically-trained personnel. Specific patient instructions are provided elsewhere under Patient Instructions section of medical record. This document was created in part using AI and STT-dictation technology, any transcriptional errors that may result from this process are unintentional.  Patient: Kristin Porter Coins  Service: E/M   PCP: Antonette Angeline ORN, NP  DOB: 09-03-1960  DOS: 11/14/2023  Provider: Emmy MARLA Blanch, NP  MRN: [id number]  Delivery: Face-to-face  Specialty: Interventional Pain Management  Type: Established Patient  Setting: Ambulatory outpatient facility  Specialty designation: 09  Referring Prov.: Antonette Angeline ORN, NP  Location: Outpatient office facility       History of present illness (HPI) [name], a 63 y.o. year old female, is here today because of her Pain management contract discussed [Z71.89]. [name] primary complain today is Back Pain and Ankle Pain (right)  Pertinent problems: [name] has Fibromyalgia; Lumbosacral spondylolysis with myelopathy and Chronic pain syndrome on their pertinent problem list.  Pain Assessment: Severity of Chronic pain is reported as a 10-Worst pain ever/10. Location: Back Lower/to thighs bilat. Onset: More than a month ago. Quality: Throbbing, Sore. Timing: Constant. Modifying factor(s): meds. Vitals:  height is 4' 11 (1.499 m) and weight is 193 lb (87.5 kg). Her temperature is 97.9 F (36.6 C). Her blood pressure is 135/80 and her pulse is 84. Her respiration is 16 and oxygen  saturation is 99%.  BMI: Estimated body mass index is 38.98 kg/m as calculated from the following:   Height as of this encounter: 4' 11 (1.499 m).   Weight as of this encounter: 193 lb (87.5 kg).  Last encounter: 05/21/2023 Last procedure: Visit date not found.  Reason for encounter: medication management. No change in medical history since last visit.  Patient's  pain is at baseline.  Patient continues multimodal pain regimen as prescribed.  States that it provides pain relief and improvement in functional status.   The patient reports persistent chronic low back pain and ankle pain.  She states that her current medication regimen is ineffective and that she was unable to sleep throughout the night due to the pain.  She is interested in exploring alternative medication option, as she does not want to continue using tramadol .  We discussed initiating Butrans  patch therapy; however the patient has tried it in the past and prefers not to uses the patch again.  She recalls that following her ED visit for her shoulder pain, the provider prescribed Percocet, which she found to be significantly more effective for both pain relief and improving her functional mobility.   Pharmacotherapy Assessment   Analgesic: Oxycodone -acetaminophen  (Percocet) 5-325 mg tablet every 8 hours as needed for pain. Baclofen  10 mg tablet 4 times daily as needed for muscle spasm Pregabalin  (Lyrica ) 100 mg capsule 2 times daily Mobic  15 mg tablet daily as needed  Monitoring: [place] PMP: PDMP reviewed during this encounter.       Pharmacotherapy: No side-effects or adverse reactions reported. Compliance: No problems identified. Effectiveness: Clinically acceptable.  [name], RN  11/14/2023  2:17 PM  Sign when Signing Visit Nursing Pain Medication Assessment:  Safety precautions to be maintained throughout the outpatient stay will include: orient to surroundings, keep bed in low position, maintain call bell within reach at all times, provide assistance with transfer out of bed and ambulation.  Medication Inspection Compliance: Pill count conducted under aseptic conditions, in front of the patient. Neither the pills nor the bottle was  removed from the patient's sight at any time. Once count was completed pills were immediately returned to the patient in their original  bottle.  Medication #1: Tramadol  ER  Pill/Patch Count: 0 of 30 pills/patches remain Pill/Patch Appearance: Markings consistent with prescribed medication Bottle Appearance: Standard pharmacy container. Clearly labeled. Filled Date: 51 / 43 / 2024 Last Medication intake:  Yesterday  Medication #2: Tramadol  (Ultram ) Pill/Patch Count: 0 of 30 pills/patches remain Pill/Patch Appearance: Markings consistent with prescribed medication Bottle Appearance: Standard pharmacy container. Clearly labeled. Filled Date: 57 / 26 / 2024 Last Medication intake:  Yesterday    UDS:  Summary  Date Value Ref Range Status  10/16/2022 Note  Final    Comment:    ==================================================================== ToxASSU[employer] Select 13 (MW) ==================================================================== Test                             Result       Flag       Units  Drug Absent but Declared for Prescription Verification   Tramadol                        Not Detected UNEXPECTED ng/mg creat   Butalbital                      Not Detected UNEXPECTED ==================================================================== Test                      Result    Flag   Units      Ref Range   Creatinine              52               mg/dL      >=79 ==================================================================== Declared Medications:  The flagging and interpretation on this report are based on the  following declared medications.  Unexpected results may arise from  inaccuracies in the declared medications.   **Note: The testing scope of this panel includes these medications:   Butalbital  (Fioricet)  Tramadol  (Ultram )   **Note: The testing scope of this panel does not include the  following reported medications:   Acetaminophen  (Fioricet)  Albuterol   Aspirin   Baclofen  (Lioresal )  Caffeine  (Fioricet)  Fluticasone  (Flonase )  Ipratropium (Atrovent )  Meloxicam  (Mobic )  Omeprazole   (Prilosec)  Paroxetine  (Paxil )  Pregabalin  (Lyrica )  Suvorexant  (Belsomra )  Triamcinolone  (Kenalog ) ==================================================================== For clinical consultation, please call [phone]. ====================================================================     No results found for: CBDTHCR No results found for: D8THCCBX No results found for: D9THCCBX  ROS  Constitutional: Denies any fever or chills Gastrointestinal: No reported hemesis, hematochezia, vomiting, or acute GI distress Musculoskeletal: + LBP, ankle pain  Neurological: No reported episodes of acute onset apraxia, aphasia, dysarthria, agnosia, amnesia, paralysis, loss of coordination, or loss of consciousness  Medication Review  Blood Pressure Kit, PARoxetine , Suvorexant , aspirin  EC, baclofen , butalbital -acetaminophen -caffeine , fluticasone , meloxicam , oxyCODONE -acetaminophen , pantoprazole , pregabalin , and triamcinolone  cream  History Review  Allergy: Ms. Spittler is allergic to tizanidine, lisinopril, and tramadol . Drug: Ms. Dobesh  reports no history of drug use. Alcohol:  reports that she does not currently use alcohol. Tobacco:  reports that she quit smoking about 55 years ago. Her smoking use included cigarettes. She has been exposed to tobacco smoke. She has never used smokeless tobacco. Social: Ms. Riederer  reports that she quit smoking about 55 years ago. Her smoking use included cigarettes. She has been exposed  to tobacco smoke. She has never used smokeless tobacco. She reports that she does not currently use alcohol. She reports that she does not use drugs. 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 Surgical: Ms. Laser  has a past surgical  history that includes Breast biopsy (Right, 2018); Esophagogastroduodenoscopy (egd) with propofol  (N/A, 02/22/2021); Breast biopsy (Left, 07/12/2021); Replacement total knee (Left); Cesarean section; Colonoscopy; Back surgery (2016); Mass excision (Right, 03/05/2023); and Ankle surgery (Right, 08/2023). Family: Family history is unknown by patient.  Laboratory Chemistry Profile   Renal Lab Results  Component Value Date   BUN 17 10/10/2023   CREATININE 0.66 10/10/2023   BCR SEE NOTE: 10/10/2023   GFRAA 107 03/22/2020   GFRNONAA >60 08/06/2023    Hepatic Lab Results  Component Value Date   AST 13 10/10/2023   ALT 13 10/10/2023    Electrolytes Lab Results  Component Value Date   NA 137 10/10/2023   K 4.5 10/10/2023   CL 103 10/10/2023   CALCIUM  9.7 10/10/2023    Bone No results found for: VD25OH, VD125OH2TOT, CI6874NY7, CI7874NY7, 25OHVITD1, 25OHVITD2, 25OHVITD3, TESTOFREE, TESTOSTERONE  Inflammation (CRP: Acute Phase) (ESR: Chronic Phase) Lab Results  Component Value Date   CRP 15.2 (H) 12/12/2020   ESRSEDRATE 34 (H) 12/12/2020         Note: Above Lab results reviewed.  Recent Imaging Review  DG Chest 2 View CLINICAL DATA:  Pain  EXAM: CHEST - 2 VIEW  COMPARISON:  None Available.  FINDINGS: The heart size and mediastinal contours are within normal limits. Both lungs are clear. The visualized skeletal structures are unremarkable.  IMPRESSION: No active cardiopulmonary disease.  Electronically Signed   By: Franky Crease M.D.   On: 08/06/2023 22:42 Note: Reviewed        Physical Exam  General appearance: Well nourished, well developed, and well hydrated. In no apparent acute distress Mental status: Alert, oriented x 3 (person, place, & time)       Respiratory: No evidence of acute respiratory distress Eyes: PERLA Vitals: BP 135/80   Pulse 84   Temp 97.9 F (36.6 C)   Resp 16   Ht 4' 11 (1.499 m)   Wt 193 lb (87.5 kg)   SpO2 99%    BMI 38.98 kg/m  BMI: Estimated body mass index is 38.98 kg/m as calculated from the following:   Height as of this encounter: 4' 11 (1.499 m).   Weight as of this encounter: 193 lb (87.5 kg). Ideal: Patient must be at least 60 in tall to calculate ideal body weight  Assessment   Diagnosis Status  1. Pain management contract discussed   2. Fibromyalgia   3. Chronic pain syndrome   4. Myofascial pain syndrome of lumbar spine   5. Thoracic myelopathy (due to syrinx, s/p excision 12/23/2017)   6. Pain management contract signed   7. Medication management    Controlled Controlled Controlled   Updated Problems: Problem  Pain Management Contract Signed  Medication Management    Plan of Care  Problem-specific:  Assessment and Plan  Plan: Started Oxycodone -acetaminophen  5-325 mg tablet every 8 hours as needed for pain (Must last 30 days) Routine UDS ordered today. Pain contract discuss with patient and completed.  Schedule follow-up in 30 days for evaluation of med management.  No new issues or  problems reported to to this visit.    Ms. ASHLEI CHINCHILLA has a current medication list which includes the following long-term medication(s): fluticasone , pantoprazole , paroxetine , and pregabalin .  Pharmacotherapy (Medications Ordered): Meds ordered this encounter  Medications   pregabalin  (LYRICA ) 100 MG capsule    Sig: Take 1-2 capsules (100-200 mg total) by mouth 2 (two) times daily.    Dispense:  60 capsule    Refill:  5    Fill one day early if pharmacy is closed on scheduled refill date. May substitute for generic if available.   oxyCODONE -acetaminophen  (PERCOCET) 5-325 MG tablet    Sig: Take 1 tablet by mouth every 8 (eight) hours as needed for severe pain (pain score 7-10). Must last 30 days.    Dispense:  90 tablet    Refill:  0   meloxicam  (MOBIC ) 15 MG tablet    Sig: Take 1 tablet (15 mg total) by mouth daily as needed for pain.    Dispense:  90 tablet    Refill:  1    baclofen  (LIORESAL ) 10 MG tablet    Sig: Take 1 tablet (10 mg total) by mouth 4 (four) times daily as needed for muscle spasms.    Dispense:  120 each    Refill:  5   Orders:  Orders Placed This Encounter  Procedures   ToxASSURE Select 13 (MW), Urine    Volume: 30 ml(s). Minimum 3 ml of urine is needed. Document temperature of fresh sample. Indications: Long term (current) use of opiate analgesic (S20.108)    Release to patient:   Immediate        Return in about 1 month (around 12/14/2023) for (F2F), (MM), Emmy Blanch NP.    Recent Visits No visits were found meeting these conditions. Showing recent visits within past 90 days and meeting all other requirements Today's Visits Date Type Provider Dept  11/14/23 Office Visit Foye Haggart K, NP [facility]  Showing today's visits and meeting all other requirements Future Appointments No visits were found meeting these conditions. Showing future appointments within next 90 days and meeting all other requirements  I discussed the assessment and treatment plan with the patient. The patient was provided an opportunity to ask questions and all were answered. The patient agreed with the plan and demonstrated an understanding of the instructions.  Patient advised to call back or seek an in-person evaluation if the symptoms or condition worsens.  Duration of encounter: 30 minutes.  Total time on encounter, as per AMA guidelines included both the face-to-face and non-face-to-face time personally spent by the physician and/or other qualified health care professional(s) on the day of the encounter (includes time in activities that require the physician or other qualified health care professional and does not include time in activities normally performed by clinical staff). Physician's time may include the following activities when performed: Preparing to see the patient (e.g., pre-charting review of records, searching for previously  ordered imaging, lab work, and nerve conduction tests) Review of prior analgesic pharmacotherapies. Reviewing PMP Interpreting ordered tests (e.g., lab work, imaging, nerve conduction tests) Performing post-procedure evaluations, including interpretation of diagnostic procedures Obtaining and/or reviewing separately obtained history Performing a medically appropriate examination and/or evaluation Counseling and educating the patient/family/caregiver Ordering medications, tests, or procedures Referring and communicating with other health care professionals (when not separately reported) Documenting clinical information in the electronic or other health record Independently interpreting results (not separately reported) and communicating results to the patient/ family/caregiver Care coordination (not  separately reported)  Note by: Zhoe Catania K Aarianna Hoadley, NP (TTS and AI technology used. I apologize for any typographical errors that were not detected and corrected.) Date: 11/14/2023; Time: 2:48 PM

## 2023-11-14 NOTE — Progress Notes (Signed)
 Nursing Pain Medication Assessment:  Safety precautions to be maintained throughout the outpatient stay will include: orient to surroundings, keep bed in low position, maintain call bell within reach at all times, provide assistance with transfer out of bed and ambulation.  Medication Inspection Compliance: Pill count conducted under aseptic conditions, in front of the patient. Neither the pills nor the bottle was removed from the patient's sight at any time. Once count was completed pills were immediately returned to the patient in their original bottle.  Medication #1: Tramadol  ER  Pill/Patch Count: 0 of 30 pills/patches remain Pill/Patch Appearance: Markings consistent with prescribed medication Bottle Appearance: Standard pharmacy container. Clearly labeled. Filled Date: 8 / 33 / 2024 Last Medication intake:  Yesterday  Medication #2: Tramadol  (Ultram ) Pill/Patch Count: 0 of 30 pills/patches remain Pill/Patch Appearance: Markings consistent with prescribed medication Bottle Appearance: Standard pharmacy container. Clearly labeled. Filled Date: 73 / 26 / 2024 Last Medication intake:  Yesterday

## 2023-11-15 IMAGING — US US BREAST*L* LIMITED INC AXILLA
1 series · 1 of 1 positions shown · non-contrast
Comparison: None Available.

CLINICAL DATA: 61-year-old with a spontaneous bloody LEFT nipple
discharge. Recent diagnostic MRI demonstrated an enhancing 6 mm mass
in the upper-outer retroareolar LEFT breast. MRI-directed
second-look ultrasound is performed to determine if there is a
sonographic correlate that could potentially be biopsied.

She had a benign core needle biopsy of the inner LEFT breast at the
9 o'clock location on 07/12/2021, pathology demonstrating duct
ectasia and apocrine metaplasia.
EXAM:
ULTRASOUND OF THE LEFT BREAST

[Series 2: us breast*left* limited inc axilla · 0.04mm/px · 1 of 1 slices shown]
[im 1/1]
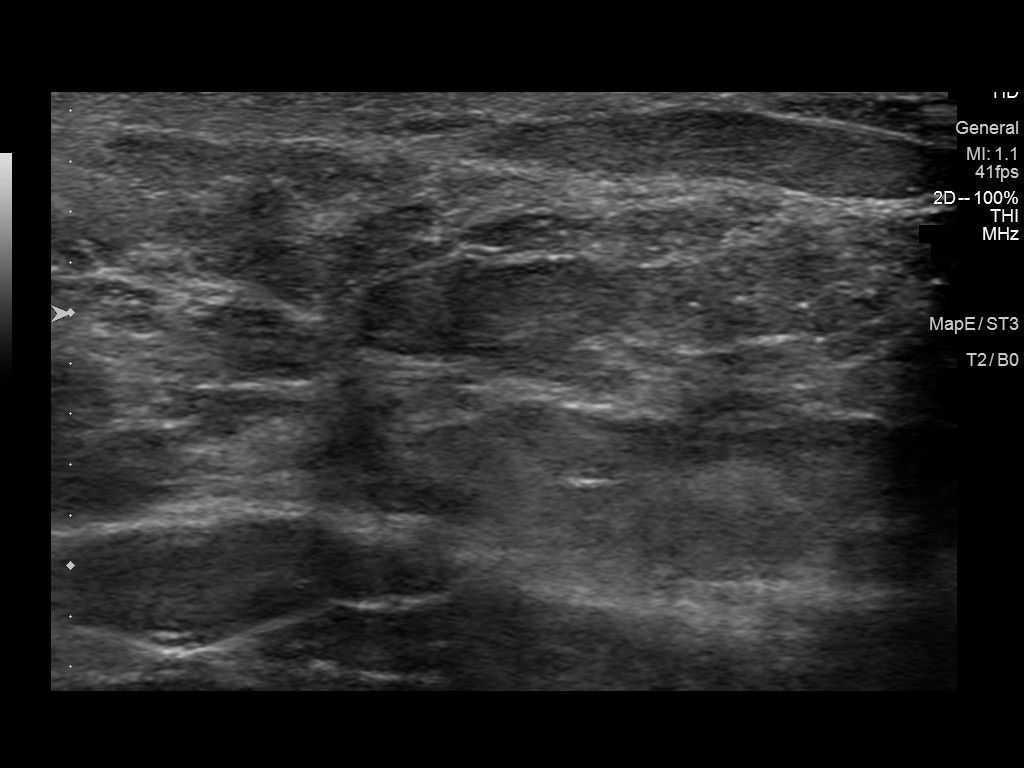

[1 of 1 positions shown; findings below may reference images not displayed]

FINDINGS: Targeted ultrasound is performed in the upper retroareolar location,
and there is no sonographic correlate for the mass identified on
MRI.
IMPRESSION: No sonographic correlate for the 6 mm mass identified on MRI in the
upper-outer retroareolar LEFT breast.

RECOMMENDATION:
MRI guided core needle biopsy of the LEFT breast mass.

I have discussed the findings and recommendations with the patient.

BI-RADS CATEGORY  4: Suspicious.

## 2023-11-18 LAB — TOXASSURE SELECT 13 (MW), URINE

## 2023-11-29 ENCOUNTER — Telehealth: Payer: Self-pay | Admitting: Internal Medicine

## 2023-11-29 NOTE — Telephone Encounter (Signed)
 LVM 11/29/2023 to schedule AWV. Please schedule office or virtual visits.  Nathanel Paschal; Care Guide Ambulatory Clinical Support [place] [facility] Direct Dial: [phone]

## 2023-12-12 ENCOUNTER — Ambulatory Visit (HOSPITAL_BASED_OUTPATIENT_CLINIC_OR_DEPARTMENT_OTHER): Admitting: Nurse Practitioner

## 2023-12-12 DIAGNOSIS — M47817 Spondylosis without myelopathy or radiculopathy, lumbosacral region: Secondary | ICD-10-CM

## 2023-12-12 DIAGNOSIS — G894 Chronic pain syndrome: Secondary | ICD-10-CM

## 2023-12-12 DIAGNOSIS — M7918 Myalgia, other site: Secondary | ICD-10-CM

## 2023-12-12 DIAGNOSIS — M797 Fibromyalgia: Secondary | ICD-10-CM

## 2023-12-12 DIAGNOSIS — M4714 Other spondylosis with myelopathy, thoracic region: Secondary | ICD-10-CM

## 2023-12-12 DIAGNOSIS — M47816 Spondylosis without myelopathy or radiculopathy, lumbar region: Secondary | ICD-10-CM

## 2023-12-12 DIAGNOSIS — Z79899 Other long term (current) drug therapy: Secondary | ICD-10-CM

## 2023-12-12 DIAGNOSIS — Z91199 Patient's noncompliance with other medical treatment and regimen due to unspecified reason: Secondary | ICD-10-CM

## 2023-12-12 NOTE — Progress Notes (Signed)
 12/12/2023- No show

## 2023-12-18 ENCOUNTER — Ambulatory Visit: Attending: Nurse Practitioner | Admitting: Nurse Practitioner

## 2023-12-18 ENCOUNTER — Encounter: Payer: Self-pay | Admitting: Nurse Practitioner

## 2023-12-18 VITALS — BP 159/68 | HR 66 | Temp 97.9°F | Resp 16 | Ht 59.0 in | Wt 198.0 lb

## 2023-12-18 DIAGNOSIS — M797 Fibromyalgia: Secondary | ICD-10-CM | POA: Diagnosis present

## 2023-12-18 DIAGNOSIS — M25512 Pain in left shoulder: Secondary | ICD-10-CM | POA: Insufficient documentation

## 2023-12-18 DIAGNOSIS — M47816 Spondylosis without myelopathy or radiculopathy, lumbar region: Secondary | ICD-10-CM | POA: Diagnosis not present

## 2023-12-18 DIAGNOSIS — M7918 Myalgia, other site: Secondary | ICD-10-CM | POA: Diagnosis present

## 2023-12-18 DIAGNOSIS — Z79899 Other long term (current) drug therapy: Secondary | ICD-10-CM | POA: Insufficient documentation

## 2023-12-18 DIAGNOSIS — G894 Chronic pain syndrome: Secondary | ICD-10-CM | POA: Diagnosis not present

## 2023-12-18 MED ORDER — OXYCODONE-ACETAMINOPHEN 7.5-325 MG PO TABS: 1.0000 | ORAL_TABLET | Freq: Three times a day (TID) | ORAL | 0 refills | Status: DC | PRN

## 2023-12-18 MED ORDER — KETOROLAC TROMETHAMINE 60 MG/2ML IM SOLN
60.0000 mg | Freq: Once | INTRAMUSCULAR | Status: AC
  Administered 2023-12-18: 60 mg via INTRAMUSCULAR

## 2023-12-18 MED ORDER — METHOCARBAMOL 1000 MG/10ML IJ SOLN
200.0000 mg | Freq: Once | INTRAMUSCULAR | Status: AC
  Administered 2023-12-18: 200 mg via INTRAMUSCULAR
  Filled 2023-12-18: qty 10

## 2023-12-18 MED ORDER — KETOROLAC TROMETHAMINE 60 MG/2ML IM SOLN
INTRAMUSCULAR | Status: AC
  Filled 2023-12-18: qty 2

## 2023-12-18 NOTE — Patient Instructions (Signed)
Medication Rules  Applies to: All patients receiving prescriptions (written or electronic).  Pharmacy of record: Pharmacy where electronic prescriptions will be sent. If written prescriptions are taken to a different pharmacy, please inform the nursing staff. The pharmacy listed in the electronic medical record should be the one where you would like electronic prescriptions to be sent.  Prescription refills: Only during scheduled appointments. Applies to both, written and electronic prescriptions.  NOTE: The following applies primarily to controlled substances (Opioid* Pain Medications).   Patient's responsibilities: Pain Pills: Bring all pain pills to every appointment (except for procedure appointments). Pill Bottles: Bring pills in original pharmacy bottle. Always bring newest bottle. Bring bottle, even if empty. Medication refills: You are responsible for knowing and keeping track of what medications you need refilled. The day before your appointment, write a list of all prescriptions that need to be refilled. Bring that list to your appointment and give it to the admitting nurse. Prescriptions will be written only during appointments. If you forget a medication, it will not be "Called in", "Faxed", or "electronically sent". You will need to get another appointment to get these prescribed. Prescription Accuracy: You are responsible for carefully inspecting your prescriptions before leaving our office. Have the discharge nurse carefully go over each prescription with you, before taking them home. Make sure that your name is accurately spelled, that your address is correct. Check the name and dose of your medication to make sure it is accurate. Check the number of pills, and the written instructions to make sure they are clear and accurate. Make sure that you are given enough medication to last until your next medication refill appointment. Taking Medication: Take medication as prescribed. Never  take more pills than instructed. Never take medication more frequently than prescribed. Taking less pills or less frequently is permitted and encouraged, when it comes to controlled substances (written prescriptions).  Inform other Doctors: Always inform, all of your healthcare providers, of all the medications you take. Pain Medication from other Providers: You are not allowed to accept any additional pain medication from any other Doctor or Healthcare provider. There are two exceptions to this rule. (see below) In the event that you require additional pain medication, you are responsible for notifying us, as stated below. Medication Agreement: You are responsible for carefully reading and following our Medication Agreement. This must be signed before receiving any prescriptions from our practice. Safely store a copy of your signed Agreement. Violations to the Agreement will result in no further prescriptions. (Additional copies of our Medication Agreement are available upon request.) Laws, Rules, & Regulations: All patients are expected to follow all Federal and State Laws, Statutes, Rules, & Regulations. Ignorance of the Laws does not constitute a valid excuse. The use of any illegal substances is prohibited. Adopted CDC guidelines & recommendations: Target dosing levels will be at or below 60 MME/day. Use of benzodiazepines** is not recommended.  Exceptions: There are only two exceptions to the rule of not receiving pain medications from other Healthcare Providers. Exception #1 (Emergencies): In the event of an emergency (i.e.: accident requiring emergency care), you are allowed to receive additional pain medication. However, you are responsible for: As soon as you are able, call our office [phone], at any time of the day or night, and leave a message stating your name, the date and nature of the emergency, and the name and dose of the medication prescribed. In the event that your call is answered  by a member of   our staff, make sure to document and save the date, time, and the name of the person that took your information.  Exception #2 (Planned Surgery): In the event that you are scheduled by another doctor or dentist to have any type of surgery or procedure, you are allowed (for a period no longer than 30 days), to receive additional pain medication, for the acute post-op pain. However, in this case, you are responsible for picking up a copy of our "Post-op Pain Management for Surgeons" handout, and giving it to your surgeon or dentist. This document is available at our office, and does not require an appointment to obtain it. Simply go to our office during business hours (Monday-Thursday from 8:00 AM to 4:00 PM) (Friday 8:00 AM to 12:00 Noon) or if you have a scheduled appointment with us, prior to your surgery, and ask for it by name. In addition, you will need to provide us with your name, name of your surgeon, type of surgery, and date of procedure or surgery.  *Opioid medications include: morphine, codeine, oxycodone, oxymorphone, hydrocodone, hydromorphone, meperidine, tramadol, tapentadol, buprenorphine, fentanyl, methadone. **Benzodiazepine medications include: diazepam (Valium), alprazolam (Xanax), clonazepam (Klonopine), lorazepam (Ativan), clorazepate (Tranxene), chlordiazepoxide (Librium), estazolam (Prosom), oxazepam (Serax), temazepam (Restoril), triazolam (Halcion) (Last updated: 07/18/2017)  

## 2023-12-18 NOTE — Progress Notes (Signed)
 PROVIDER NOTE: Interpretation of information contained herein should be left to medically-trained personnel. Specific patient instructions are provided elsewhere under Patient Instructions section of medical record. This document was created in part using AI and STT-dictation technology, any transcriptional errors that may result from this process are unintentional.  Patient: Kristin Porter  Service: E/M   PCP: Antonette Angeline ORN, NP  DOB: [birth year]  DOS: 12/18/2023  Provider: Emmy MARLA Blanch, NP  MRN: [id number]  Delivery: Face-to-face  Specialty: Interventional Pain Management  Type: Established Patient  Setting: Ambulatory outpatient facility  Specialty designation: 09  Referring Prov.: Antonette Angeline ORN, NP  Location: Outpatient office facility       History of present illness (HPI) Kristin Porter, a 63 y.o. year old female, is here today because of her Acute pain of left shoulder [M25.512]. Kristin Porter primary complain today is Shoulder Pain (left)  Pertinent problems: Kristin Porter has Fibromyalgia; Lumbosacral spondylosis without myelopathy; and Chronic pain syndrome on their pertinent problem list.   Pain Assessment: Severity of Chronic pain is reported as a 8 /10. Location: Shoulder Left/down left arm to fingers. Onset: More than a month ago. Quality: Sore, Throbbing. Timing: Constant. Modifying factor(s): icy hot - heating pad, cold compress. Vitals:  height is 4' 11 (1.499 m) and weight is 198 lb (89.8 kg). Her temperature is 97.9 F (36.6 C). Her blood pressure is 159/68 (abnormal) and her pulse is 66. Her respiration is 16 and oxygen  saturation is 98%.  BMI: Estimated body mass index is 39.99 kg/m as calculated from the following:   Height as of this encounter: 4' 11 (1.499 m).   Weight as of this encounter: 198 lb (89.8 kg).  Last encounter: 12/12/2023. Last procedure: Visit date not found.  Reason for encounter: medication management. No change in medical history since  last visit.  Patient's pain is at baseline.  Patient continues multimodal pain regimen as prescribed.  States that it provides pain relief and improvement in functional status.   Kristin Porter complains of left shoulder pain that radiates to her left fingers, accompanied by numbness.  The symptoms started 2 weeks ago and then pain is intermittent.  She denies any other associated symptoms and attributes this complaints to her fibromyalgia.  We discussed ordering a left shoulder x-ray for further evaluation.  Discontinue Percocet 5-325 mg every 8 hours as needed for pain as it not effective enough to control her pain level.   Pharmacotherapy Assessment   Analgesic: Oxycodone -acetaminophen  (Percocet) 7.5-325 mg tablet every 8 hours as needed for pain. MME=22.50 (with percocet 5-325)  Monitoring: [place] PMP: PDMP reviewed during this encounter.       Pharmacotherapy: No side-effects or adverse reactions reported. Compliance: No problems identified. Effectiveness: Clinically acceptable.  Kristin Pulling, RN  12/18/2023  8:44 AM  Sign when Signing Visit Nursing Pain Medication Assessment:  Safety precautions to be maintained throughout the outpatient stay will include: orient to surroundings, keep bed in low position, maintain call bell within reach at all times, provide assistance with transfer out of bed and ambulation.  Medication Inspection Compliance: Pill count conducted under aseptic conditions, in front of the patient. Neither the pills nor the bottle was removed from the patient's sight at any time. Once count was completed pills were immediately returned to the patient in their original bottle.  Medication: Oxycodone /APAP Pill/Patch Count: 72 of 90 pills/patches remain Pill/Patch Appearance: Markings consistent with prescribed medication Bottle Appearance: Standard pharmacy container. Clearly labeled. Filled Date: 6 /  26 / 2025 Last Medication intake:  Day before yesterday    UDS:  Summary   Date Value Ref Range Status  11/14/2023 FINAL  Final    Comment:    ==================================================================== [employer] Select 13 (MW) ==================================================================== Test                             Result       Flag       Units  Drug Absent but Declared for Prescription Verification   Oxycodone                       Not Detected UNEXPECTED ng/mg creat   Butalbital                      Not Detected UNEXPECTED ==================================================================== Test                      Result    Flag   Units      Ref Range   Creatinine              125              mg/dL      >=79 ==================================================================== Declared Medications:  The flagging and interpretation on this report are based on the  following declared medications.  Unexpected results may arise from  inaccuracies in the declared medications.   **Note: The testing scope of this panel includes these medications:   Butalbital  (Fioricet)  Oxycodone  (Percocet)   **Note: The testing scope of this panel does not include the  following reported medications:   Acetaminophen  (Fioricet)  Acetaminophen  (Percocet)  Aspirin   Baclofen  (Lioresal )  Caffeine  (Fioricet)  Fluticasone  (Flonase )  Meloxicam  (Mobic )  Pantoprazole  (Protonix )  Paroxetine  (Paxil )  Pregabalin  (Lyrica )  Suvorexant  (Belsomra )  Triamcinolone  (Kenalog ) ==================================================================== For clinical consultation, please call [phone]. ====================================================================     No results found for: CBDTHCR No results found for: D8THCCBX No results found for: D9THCCBX  ROS  Constitutional: Denies any fever or chills Gastrointestinal: No reported hemesis, hematochezia, vomiting, or acute GI distress Musculoskeletal: Left shoulder pain radiates to left  fingers Neurological: No reported episodes of acute onset apraxia, aphasia, dysarthria, agnosia, amnesia, paralysis, loss of coordination, or loss of consciousness  Medication Review  Blood Pressure Kit, PARoxetine , Suvorexant , aspirin  EC, baclofen , butalbital -acetaminophen -caffeine , fluticasone , meloxicam , oxyCODONE -acetaminophen , pantoprazole , pregabalin , and triamcinolone  cream  History Review  Allergy: Ms. Vazguez is allergic to tizanidine, lisinopril, and tramadol . Drug: Ms. Piotrowski  reports no history of drug use. Alcohol:  reports that she does not currently use alcohol. Tobacco:  reports that she quit smoking about 55 years ago. Her smoking use included cigarettes. She has been exposed to tobacco smoke. She has never used smokeless tobacco. Social: Ms. Bai  reports that she quit smoking about 55 years ago. Her smoking use included cigarettes. She has been exposed to tobacco smoke. She has never used smokeless tobacco. She reports that she does not currently use alcohol. She reports that she does not use drugs. 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 Surgical: Ms. Molock  has a past surgical history that includes Breast biopsy (Right, 2018); Esophagogastroduodenoscopy (egd) with propofol  (N/A, 02/22/2021); Breast biopsy (Left, 07/12/2021); Replacement total knee (Left);  Cesarean section; Colonoscopy; Back surgery (2016); Mass excision (Right, 03/05/2023); and Ankle surgery (Right, 08/2023). Family: Family history is unknown by patient.  Laboratory Chemistry Profile   Renal Lab Results  Component Value Date   BUN 17 10/10/2023   CREATININE 0.66 10/10/2023   BCR SEE NOTE: 10/10/2023   GFRAA 107 03/22/2020   GFRNONAA >60 08/06/2023    Hepatic Lab  Results  Component Value Date   AST 13 10/10/2023   ALT 13 10/10/2023    Electrolytes Lab Results  Component Value Date   NA 137 10/10/2023   K 4.5 10/10/2023   CL 103 10/10/2023   CALCIUM  9.7 10/10/2023    Bone No results found for: VD25OH, VD125OH2TOT, CI6874NY7, CI7874NY7, 25OHVITD1, 25OHVITD2, 25OHVITD3, TESTOFREE, TESTOSTERONE  Inflammation (CRP: Acute Phase) (ESR: Chronic Phase) Lab Results  Component Value Date   CRP 15.2 (H) 12/12/2020   ESRSEDRATE 34 (H) 12/12/2020         Note: Above Lab results reviewed.  Recent Imaging Review  DG Chest 2 View CLINICAL DATA:  Pain  EXAM: CHEST - 2 VIEW  COMPARISON:  None Available.  FINDINGS: The heart size and mediastinal contours are within normal limits. Both lungs are clear. The visualized skeletal structures are unremarkable.  IMPRESSION: No active cardiopulmonary disease.  Electronically Signed   By: Franky Crease M.D.   On: 08/06/2023 22:42 Note: Reviewed        Physical Exam  Vitals: BP (!) 159/68   Pulse 66   Temp 97.9 F (36.6 C)   Resp 16   Ht 4' 11 (1.499 m)   Wt 198 lb (89.8 kg)   SpO2 98%   BMI 39.99 kg/m  BMI: Estimated body mass index is 39.99 kg/m as calculated from the following:   Height as of this encounter: 4' 11 (1.499 m).   Weight as of this encounter: 198 lb (89.8 kg). Ideal: Patient must be at least 60 in tall to calculate ideal body weight General appearance: Well nourished, well developed, and well hydrated. In no apparent acute distress Mental status: Alert, oriented x 3 (person, place, & time)       Respiratory: No evidence of acute respiratory distress Eyes: PERLA   Assessment   Diagnosis Status  1. Acute pain of left shoulder   2. Chronic pain syndrome   3. Fibromyalgia   4. Myofascial pain syndrome of lumbar spine   5. Medication management   6. Lumbar facet joint syndrome    Controlled Controlled Controlled   Updated Problems: Problem   Shoulder Pain, Left    Plan of Care  Problem-specific:  Assessment and Plan Discontinue Percocet 5-325 mg every 8 hours as needed for pain as it not effective enough to control her pain level.   Started Percocet 7.5 325 mg every 8 hours as needed for pain. Prescribing drug monitoring (PDMP) reviewed; findings consistent with the use of prescribed medication and no evidence of narcotic misuse or abuse.  Urine drug screening up-to-date.  Schedule follow-up in 30 days for medication management.  Plan: Left shoulder X-ray for further evaluation    Ms. Ebelin O Allnutt has a current medication list which includes the following long-term medication(s): fluticasone , pantoprazole , paroxetine , and pregabalin .  Pharmacotherapy (Medications Ordered): Meds ordered this encounter  Medications   oxyCODONE -acetaminophen  (PERCOCET) 7.5-325 MG tablet    Sig: Take 1 tablet by mouth every 8 (eight) hours as needed for severe pain (pain score 7-10).    Dispense:  30 tablet  Refill:  0   ketorolac  (TORADOL ) injection 60 mg   methocarbamol  (ROBAXIN ) injection 200 mg   Orders:  Orders Placed This Encounter  Procedures   DG Shoulder Left    Please make sure that the patient understands that this needs to be done as soon as possible. Never have the patient do the imaging just before the next appointment. Inform patient that having the imaging done within the [facility] will expedite the availability of the results and will provide imaging availability to the requesting physician. In addition inform the patient that the imaging order has an expiration date and will not be renewed if not done within the active period.    Standing Status:   Future    Expiration Date:   03/19/2024    Scheduling Instructions:     Imaging must be done as soon as possible. Inform patient that order will expire within 30 days and I will not renew it.    Reason for Exam (SYMPTOM  OR DIAGNOSIS REQUIRED):   Left shoulder  pain    Preferred imaging location?:   [facility]    Call Results- Best Contact Number?:   [phone] [facility]    Release to patient:   Immediate        Return in about 1 month (around 01/18/2024) for (F2F), (MM), Emmy Blanch NP, review of ordered tests.    Recent Visits Date Type Provider Dept  11/14/23 Office Visit Tiegan Terpstra K, NP [facility]  Showing recent visits within past 90 days and meeting all other requirements Today's Visits Date Type Provider Dept  12/18/23 Office Visit Fuquan Wilson K, NP [facility]  Showing today's visits and meeting all other requirements Future Appointments Date Type Provider Dept  01/08/24 Appointment Briggitte Boline K, NP [facility]  Showing future appointments within next 90 days and meeting all other requirements  I discussed the assessment and treatment plan with the patient. The patient was provided an opportunity to ask questions and all were answered. The patient agreed with the plan and demonstrated an understanding of the instructions.  Patient advised to call back or seek an in-person evaluation if the symptoms or condition worsens.  Duration of encounter: 30 minutes.  Total time on encounter, as per AMA guidelines included both the face-to-face and non-face-to-face time personally spent by the physician and/or other qualified health care professional(s) on the day of the encounter (includes time in activities that require the physician or other qualified health care professional and does not include time in activities normally performed by clinical staff). Physician's time may include the following activities when performed: Preparing to see the patient (e.g., pre-charting review of records, searching for previously ordered imaging, lab work, and nerve conduction tests) Review of prior analgesic pharmacotherapies. Reviewing PMP Interpreting  ordered tests (e.g., lab work, imaging, nerve conduction tests) Performing post-procedure evaluations, including interpretation of diagnostic procedures Obtaining and/or reviewing separately obtained history Performing a medically appropriate examination and/or evaluation Counseling and educating the patient/family/caregiver Ordering medications, tests, or procedures Referring and communicating with other health care professionals (when not separately reported) Documenting clinical information in the electronic or other health record Independently interpreting results (not separately reported) and communicating results to the patient/ family/caregiver Care coordination (not separately reported)  Note by: Smt Lokey K Liban Guedes, NP (TTS and AI technology used. I apologize for any typographical errors that were not detected and corrected.) Date: 12/18/2023; Time: 9:40 AM

## 2023-12-18 NOTE — Progress Notes (Signed)
 Nursing Pain Medication Assessment:  Safety precautions to be maintained throughout the outpatient stay will include: orient to surroundings, keep bed in low position, maintain call bell within reach at all times, provide assistance with transfer out of bed and ambulation.  Medication Inspection Compliance: Pill count conducted under aseptic conditions, in front of the patient. Neither the pills nor the bottle was removed from the patient's sight at any time. Once count was completed pills were immediately returned to the patient in their original bottle.  Medication: Oxycodone /APAP Pill/Patch Count: 72 of 90 pills/patches remain Pill/Patch Appearance: Markings consistent with prescribed medication Bottle Appearance: Standard pharmacy container. Clearly labeled. Filled Date: 6 / 63 / 2025 Last Medication intake:  Day before yesterday

## 2024-01-08 ENCOUNTER — Encounter: Payer: Self-pay | Admitting: Nurse Practitioner

## 2024-01-08 ENCOUNTER — Ambulatory Visit: Attending: Nurse Practitioner | Admitting: Nurse Practitioner

## 2024-01-08 DIAGNOSIS — Z79899 Other long term (current) drug therapy: Secondary | ICD-10-CM | POA: Insufficient documentation

## 2024-01-08 DIAGNOSIS — G894 Chronic pain syndrome: Secondary | ICD-10-CM | POA: Insufficient documentation

## 2024-01-08 DIAGNOSIS — M7918 Myalgia, other site: Secondary | ICD-10-CM | POA: Diagnosis present

## 2024-01-08 DIAGNOSIS — M797 Fibromyalgia: Secondary | ICD-10-CM | POA: Insufficient documentation

## 2024-01-08 DIAGNOSIS — M47816 Spondylosis without myelopathy or radiculopathy, lumbar region: Secondary | ICD-10-CM | POA: Insufficient documentation

## 2024-01-08 MED ORDER — OXYCODONE-ACETAMINOPHEN 7.5-325 MG PO TABS: 1.0000 | ORAL_TABLET | Freq: Two times a day (BID) | ORAL | 0 refills | Status: DC | PRN

## 2024-01-08 NOTE — Progress Notes (Signed)
 Nursing Pain Medication Assessment:  Safety precautions to be maintained throughout the outpatient stay will include: orient to surroundings, keep bed in low position, maintain call bell within reach at all times, provide assistance with transfer out of bed and ambulation.  Medication Inspection Compliance: Pill count conducted under aseptic conditions, in front of the patient. Neither the pills nor the bottle was removed from the patient's sight at any time. Once count was completed pills were immediately returned to the patient in their original bottle.  Medication: Oxycodone /APAP Pill/Patch Count: 52 of 90 pills/patches remain Pill/Patch Appearance: Markings consistent with prescribed medication Bottle Appearance: Standard pharmacy container. Clearly labeled. Filled Date: 6 / 1 / 2025 Last Medication intake:  Today

## 2024-01-08 NOTE — Progress Notes (Signed)
 PROVIDER NOTE: Interpretation of information contained herein should be left to medically-trained personnel. Specific patient instructions are provided elsewhere under Patient Instructions section of medical record. This document was created in part using AI and STT-dictation technology, any transcriptional errors that may result from this process are unintentional.  Patient: Kristin Porter  Service: E/M   PCP: Antonette Angeline ORN, NP  DOB: 1960-06-11  DOS: 01/08/2024  Provider: Emmy MARLA Blanch, NP  MRN: [id number]  Delivery: Face-to-face  Specialty: Interventional Pain Management  Type: Established Patient  Setting: Ambulatory outpatient facility  Specialty designation: 09  Referring Prov.: Antonette Angeline ORN, NP  Location: Outpatient office facility       History of present illness (HPI) Kristin Porter, a 63 y.o. year old female, is here today because of her No primary diagnosis found.. Kristin Porter primary complain today is Hip Pain (both)  Pertinent problems: Kristin Porter has  Fibromyalgia; Lumbosacral spondylosis without myelopathy; and Chronic pain syndrome on their pertinent problem list.   Pain Assessment: Severity of Chronic pain is reported as a 7 /10. Location: Hip Right, Left/pain radiaities down both hip, and down her legs at times. Onset: More than a month ago. Quality: Throbbing, Aching. Timing: Constant. Modifying factor(s): sitting down and rest, meds, heat. Vitals:  height is 4' 11 (1.499 m) and weight is 198 lb (89.8 kg). Her temperature is 97.3 F (36.3 C) (abnormal). Her blood pressure is 141/82 (abnormal) and her pulse is 86. Her oxygen  saturation is 97%.  BMI: Estimated body mass index is 39.99 kg/m as calculated from the following:   Height as of this encounter: 4' 11 (1.499 m).   Weight as of this encounter: 198 lb (89.8 kg).  Last encounter: 12/18/2023. Last procedure: 08/09/2021  Reason for encounter: medication management. No change in medical history since  last visit.  Patient's pain is at baseline.  Patient continues multimodal pain regimen as prescribed.  States that it provides pain relief and improvement in functional status.  The patient reports that Percocet 7.5-325 provides better pain relief compared to Percocet 5-325.  I recommended decreasing the frequency of use, as the patient takes it only when necessary. Pharmacotherapy Assessment   Analgesic: Oxycodone -acetaminophen  (Percocet) 7.5-325 mg tablet every 12 hours as needed for pain. MME=33 Monitoring: [place] PMP: PDMP reviewed during this encounter.       Pharmacotherapy: No side-effects or adverse reactions reported. Compliance: No problems identified. Effectiveness: Clinically acceptable.  Delores Dorothe LABOR, RN  01/08/2024 11:37 AM  Sign when Signing Visit Nursing Pain Medication Assessment:  Safety precautions to be maintained throughout the outpatient stay will include: orient to surroundings, keep bed in low position, maintain call bell within reach at all times, provide assistance with transfer out of bed and ambulation.  Medication Inspection Compliance: Pill count conducted under aseptic conditions, in front of the patient. Neither the pills nor the bottle was removed from the patient's sight at any time. Once count was completed pills were immediately returned to the patient in their original bottle.  Medication: Oxycodone /APAP Pill/Patch Count: 52 of 90 pills/patches remain Pill/Patch Appearance: Markings consistent with prescribed medication Bottle Appearance: Standard pharmacy container. Clearly labeled. Filled Date: 6 / 75 / 2025 Last Medication intake:  Today  Delores Dorothe LABOR, RN  01/08/2024 11:26 AM  Sign when Signing Visit Safety precautions to be maintained throughout the outpatient stay will include: orient to surroundings, keep bed in low position, maintain call bell within reach at all times, provide assistance with transfer  out of bed and ambulation.     UDS:  Summary   Date Value Ref Range Status  11/14/2023 FINAL  Final    Comment:    ==================================================================== [employer] Select 13 (MW) ==================================================================== Test                             Result       Flag       Units  Drug Absent but Declared for Prescription Verification   Oxycodone                       Not Detected UNEXPECTED ng/mg creat   Butalbital                      Not Detected UNEXPECTED ==================================================================== Test                      Result    Flag   Units      Ref Range   Creatinine              125              mg/dL      >=79 ==================================================================== Declared Medications:  The flagging and interpretation on this report are based on the  following declared medications.  Unexpected results may arise from  inaccuracies in the declared medications.   **Note: The testing scope of this panel includes these medications:   Butalbital  (Fioricet)  Oxycodone  (Percocet)   **Note: The testing scope of this panel does not include the  following reported medications:   Acetaminophen  (Fioricet)  Acetaminophen  (Percocet)  Aspirin   Baclofen  (Lioresal )  Caffeine  (Fioricet)  Fluticasone  (Flonase )  Meloxicam  (Mobic )  Pantoprazole  (Protonix )  Paroxetine  (Paxil )  Pregabalin  (Lyrica )  Suvorexant  (Belsomra )  Triamcinolone  (Kenalog ) ==================================================================== For clinical consultation, please call [phone]. ====================================================================     No results found for: CBDTHCR No results found for: D8THCCBX No results found for: D9THCCBX  ROS  Constitutional: Denies any fever or chills Gastrointestinal: No reported hemesis, hematochezia, vomiting, or acute GI distress Musculoskeletal: Denies any acute onset joint swelling,  redness, loss of ROM, or weakness Neurological: No reported episodes of acute onset apraxia, aphasia, dysarthria, agnosia, amnesia, paralysis, loss of coordination, or loss of consciousness  Medication Review  Blood Pressure Kit, PARoxetine , Suvorexant , aspirin  EC, baclofen , butalbital -acetaminophen -caffeine , fluticasone , meloxicam , oxyCODONE -acetaminophen , pantoprazole , pregabalin , and triamcinolone  cream  History Review  Allergy: Kristin Porter is allergic to tizanidine, lisinopril, and tramadol . Drug: Kristin Porter  reports no history of drug use. Alcohol:  reports that she does not currently use alcohol. Tobacco:  reports that she quit smoking about 55 years ago. Her smoking use included cigarettes. She has been exposed to tobacco smoke. She has never used smokeless tobacco. Social: Ms. Biederman  reports that she quit smoking about 55 years ago. Her smoking use included cigarettes. She has been exposed to tobacco smoke. She has never used smokeless tobacco. She reports that she does not currently use alcohol. She reports that she does not use drugs. 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 Surgical: Kristin Porter  has a past surgical history that includes Breast biopsy (Right, 2018); Esophagogastroduodenoscopy (egd) with propofol  (N/A, 02/22/2021); Breast biopsy (Left, 07/12/2021); Replacement total knee (  Left); Cesarean section; Colonoscopy; Back surgery (2016); Mass excision (Right, 03/05/2023); and Ankle surgery (Right, 08/2023). Family: Family history is unknown by patient.  Laboratory Chemistry Profile   Renal Lab Results  Component Value Date   BUN 17 10/10/2023   CREATININE 0.66 10/10/2023   BCR SEE NOTE: 10/10/2023   GFRAA 107 03/22/2020   GFRNONAA >60  08/06/2023    Hepatic Lab Results  Component Value Date   AST 13 10/10/2023   ALT 13 10/10/2023    Electrolytes Lab Results  Component Value Date   NA 137 10/10/2023   K 4.5 10/10/2023   CL 103 10/10/2023   CALCIUM  9.7 10/10/2023    Bone No results found for: VD25OH, VD125OH2TOT, CI6874NY7, CI7874NY7, 25OHVITD1, 25OHVITD2, 25OHVITD3, TESTOFREE, TESTOSTERONE  Inflammation (CRP: Acute Phase) (ESR: Chronic Phase) Lab Results  Component Value Date   CRP 15.2 (H) 12/12/2020   ESRSEDRATE 34 (H) 12/12/2020         Note: Above Lab results reviewed.  Recent Imaging Review  DG Chest 2 View CLINICAL DATA:  Pain  EXAM: CHEST - 2 VIEW  COMPARISON:  None Available.  FINDINGS: The heart size and mediastinal contours are within normal limits. Both lungs are clear. The visualized skeletal structures are unremarkable.  IMPRESSION: No active cardiopulmonary disease.  Electronically Signed   By: Franky Crease M.D.   On: 08/06/2023 22:42 Note: Reviewed        Physical Exam  Vitals: BP (!) 141/82   Pulse 86   Temp (!) 97.3 F (36.3 C)   Ht 4' 11 (1.499 m)   Wt 198 lb (89.8 kg)   SpO2 97%   BMI 39.99 kg/m  BMI: Estimated body mass index is 39.99 kg/m as calculated from the following:   Height as of this encounter: 4' 11 (1.499 m).   Weight as of this encounter: 198 lb (89.8 kg). Ideal: Patient must be at least 60 in tall to calculate ideal body weight General appearance: Well nourished, well developed, and well hydrated. In no apparent acute distress Mental status: Alert, oriented x 3 (person, place, & time)       Respiratory: No evidence of acute respiratory distress Eyes: PERLA   Assessment   Diagnosis Status  1. Chronic pain syndrome   2. Fibromyalgia   3. Myofascial pain syndrome of lumbar spine   4. Medication management   5. Lumbar facet joint syndrome    Controlled Controlled Controlled   Updated Problems: No problems  updated.  Plan of Care  Problem-specific:  Assessment and Plan We will continue on current medication regimen with the change of frequency Percocet 7.5 325 every 12 hours as needed for pain for up to 30 days.  Prescription drug monitoring (PDMP) reviewed; findings consistent with the use of prescribed medication and no evidence of narcotic misuse or abuse.  Schedule follow-up in 30 days for medication management. UDS up to date.  No new issues or problems reported this visit.   Kristin Porter has a current medication list which includes the following long-term medication(s): fluticasone , pantoprazole , paroxetine , and pregabalin .  Pharmacotherapy (Medications Ordered): Meds ordered this encounter  Medications   oxyCODONE -acetaminophen  (PERCOCET) 7.5-325 MG tablet    Sig: Take 1 tablet by mouth every 12 (twelve) hours as needed for severe pain (pain score 7-10).    Dispense:  60 tablet    Refill:  0   Orders:  No orders of the defined types were placed in this encounter.  Return in about 1 month (around 02/08/2024) for (F2F), (MM), Emmy Blanch NP.    Recent Visits Date Type Provider Dept  12/18/23 Office Visit Lanelle Lindo K, NP [facility]  11/14/23 Office Visit Laquonda Welby K, NP [facility]  Showing recent visits within past 90 days and meeting all other requirements Today's Visits Date Type Provider Dept  01/08/24 Office Visit Blanca Carreon K, NP [facility]  Showing today's visits and meeting all other requirements Future Appointments Date Type Provider Dept  02/03/24 Appointment Joelene Barriere K, NP [facility]  Showing future appointments within next 90 days and meeting all other requirements  I discussed the assessment and treatment plan with the patient. The patient was provided an opportunity to ask questions and all were answered. The patient agreed with the plan and demonstrated an understanding of the  instructions.  Patient advised to call back or seek an in-person evaluation if the symptoms or condition worsens.  Duration of encounter: 30 minutes.  Total time on encounter, as per AMA guidelines included both the face-to-face and non-face-to-face time personally spent by the physician and/or other qualified health care professional(s) on the day of the encounter (includes time in activities that require the physician or other qualified health care professional and does not include time in activities normally performed by clinical staff). Physician's time may include the following activities when performed: Preparing to see the patient (e.g., pre-charting review of records, searching for previously ordered imaging, lab work, and nerve conduction tests) Review of prior analgesic pharmacotherapies. Reviewing PMP Interpreting ordered tests (e.g., lab work, imaging, nerve conduction tests) Performing post-procedure evaluations, including interpretation of diagnostic procedures Obtaining and/or reviewing separately obtained history Performing a medically appropriate examination and/or evaluation Counseling and educating the patient/family/caregiver Ordering medications, tests, or procedures Referring and communicating with other health care professionals (when not separately reported) Documenting clinical information in the electronic or other health record Independently interpreting results (not separately reported) and communicating results to the patient/ family/caregiver Care coordination (not separately reported)  Note by: Jahshua Bonito K Aurthur Wingerter, NP (TTS and AI technology used. I apologize for any typographical errors that were not detected and corrected.) Date: 01/08/2024; Time: 11:58 AM

## 2024-01-08 NOTE — Progress Notes (Signed)
 Safety precautions to be maintained throughout the outpatient stay will include: orient to surroundings, keep bed in low position, maintain call bell within reach at all times, provide assistance with transfer out of bed and ambulation.

## 2024-02-03 ENCOUNTER — Encounter: Payer: Self-pay | Admitting: Nurse Practitioner

## 2024-02-03 ENCOUNTER — Ambulatory Visit: Attending: Nurse Practitioner | Admitting: Nurse Practitioner

## 2024-02-03 VITALS — BP 159/81 | HR 79 | Temp 97.3°F | Resp 18 | Ht 59.0 in | Wt 198.0 lb

## 2024-02-03 DIAGNOSIS — M25551 Pain in right hip: Secondary | ICD-10-CM | POA: Diagnosis present

## 2024-02-03 DIAGNOSIS — M25552 Pain in left hip: Secondary | ICD-10-CM | POA: Diagnosis present

## 2024-02-03 DIAGNOSIS — G894 Chronic pain syndrome: Secondary | ICD-10-CM | POA: Insufficient documentation

## 2024-02-03 DIAGNOSIS — G8929 Other chronic pain: Secondary | ICD-10-CM | POA: Diagnosis present

## 2024-02-03 DIAGNOSIS — M47816 Spondylosis without myelopathy or radiculopathy, lumbar region: Secondary | ICD-10-CM | POA: Insufficient documentation

## 2024-02-03 DIAGNOSIS — M545 Low back pain, unspecified: Secondary | ICD-10-CM | POA: Insufficient documentation

## 2024-02-03 DIAGNOSIS — M47817 Spondylosis without myelopathy or radiculopathy, lumbosacral region: Secondary | ICD-10-CM | POA: Insufficient documentation

## 2024-02-03 DIAGNOSIS — M7918 Myalgia, other site: Secondary | ICD-10-CM | POA: Insufficient documentation

## 2024-02-03 DIAGNOSIS — Z79899 Other long term (current) drug therapy: Secondary | ICD-10-CM | POA: Insufficient documentation

## 2024-02-03 DIAGNOSIS — M797 Fibromyalgia: Secondary | ICD-10-CM | POA: Diagnosis present

## 2024-02-03 MED ORDER — OXYCODONE-ACETAMINOPHEN 7.5-325 MG PO TABS: 1.0000 | ORAL_TABLET | Freq: Two times a day (BID) | ORAL | 0 refills | Status: DC | PRN

## 2024-02-03 NOTE — Progress Notes (Signed)
 Nursing Pain Medication Assessment:  Safety precautions to be maintained throughout the outpatient stay will include: orient to surroundings, keep bed in low position, maintain call bell within reach at all times, provide assistance with transfer out of bed and ambulation.  Medication Inspection Compliance: Pill count conducted under aseptic conditions, in front of the patient. Neither the pills nor the bottle was removed from the patient's sight at any time. Once count was completed pills were immediately returned to the patient in their original bottle.  Medication: Oxycodone /APAP Pill/Patch Count: 20 of 60 pills/patches remain Pill/Patch Appearance: Markings consistent with prescribed medication Bottle Appearance: Standard pharmacy container. Clearly labeled. Filled Date: 08 / 20 / 2025 Last Medication intake:  Yesterday

## 2024-02-03 NOTE — Progress Notes (Signed)
 PROVIDER NOTE: Interpretation of information contained herein should be left to medically-trained personnel. Specific patient instructions are provided elsewhere under Patient Instructions section of medical record. This document was created in part using AI and STT-dictation technology, any transcriptional errors that may result from this process are unintentional.  Patient: Kristin Porter  Service: E/M   PCP: Antonette Angeline ORN, NP  DOB: Jul 10, 1960  DOS: 02/03/2024  Provider: Emmy MARLA Blanch, NP  MRN: [id number]  Delivery: Face-to-face  Specialty: Interventional Pain Management  Type: Established Patient  Setting: Ambulatory outpatient facility  Specialty designation: 09  Referring Prov.: Antonette Angeline ORN, NP  Location: Outpatient office facility       History of present illness (HPI) Kristin Porter, a 63 y.o. year old female, is here today because of her Chronic pain of both hips [M25.551, M25.552, G89.29]. Ms. Ellerman primary complain today is Back Pain and bilateral hip pain  Pertinent problems: Ms. Hinderliter has Fibromyalgia; Lumbosacral spondylosis without myelopathy; Chronic hip pain (Bilateral), and Chronic pain syndrome on their pertinent problem list.   Pain Assessment: Severity of Chronic pain is reported as a 6 /10. Location: Back Lower/Both hips, Sometimes down bilateral legs. Onset: More than a month ago. Quality: Tingling, Throbbing, Dull, Sharp. Timing: Constant. Modifying factor(s): Rest, medication. Vitals:  height is 4' 11 (1.499 m) and weight is 198 lb (89.8 kg). Her temporal temperature is 97.3 F (36.3 C) (abnormal). Her blood pressure is 159/81 (abnormal) and her pulse is 79. Her respiration is 18 and oxygen  saturation is 100%.  BMI: Estimated body mass index is 39.99 kg/m as calculated from the following:   Height as of this encounter: 4' 11 (1.499 m).   Weight as of this encounter: 198 lb (89.8 kg).  Last encounter: 01/08/2024. Last procedure: Visit date not  found.  Reason for encounter: evaluation for possible interventional PM therapy/treatment and medication management. No change in medical history since last visit.  Patient's pain is at baseline.  Patient continues multimodal pain regimen as prescribed.  States that it provides pain relief and improvement in functional status.  The patient continues to experience chronic bilateral hip pain that significantly impacts her daily activities.  Bilateral hip x-ray shows degenerative changes.  Given her chronic pain and hip degeneration, we discussed the option of bilateral intra-articular hip injections, and the patient expressed interest in proceeding with them.  Pharmacotherapy Assessment   Analgesic: Oxycodone -acetaminophen  (Percocet) 7.5-325 mg tablet every 12 hours as needed for pain. MME=33 Monitoring: [place] PMP: PDMP reviewed during this encounter.       Pharmacotherapy: No side-effects or adverse reactions reported. Compliance: No problems identified. Effectiveness: Clinically acceptable.  Kristin Porter, [place]  02/03/2024 11:00 AM  Sign when Signing Visit Nursing Pain Medication Assessment:  Safety precautions to be maintained throughout the outpatient stay will include: orient to surroundings, keep bed in low position, maintain call bell within reach at all times, provide assistance with transfer out of bed and ambulation.  Medication Inspection Compliance: Pill count conducted under aseptic conditions, in front of the patient. Neither the pills nor the bottle was removed from the patient's sight at any time. Once count was completed pills were immediately returned to the patient in their original bottle.  Medication: Oxycodone /APAP Pill/Patch Count: 20 of 60 pills/patches remain Pill/Patch Appearance: Markings consistent with prescribed medication Bottle Appearance: Standard pharmacy container. Clearly labeled. Filled Date: 08 / 20 / 2025 Last Medication intake:  Yesterday    UDS:  Summary   Date  Value Ref Range Status  11/14/2023 FINAL  Final    Comment:    ==================================================================== [employer] Select 13 (MW) ==================================================================== Test                             Result       Flag       Units  Drug Absent but Declared for Prescription Verification   Oxycodone                       Not Detected UNEXPECTED ng/mg creat   Butalbital                      Not Detected UNEXPECTED ==================================================================== Test                      Result    Flag   Units      Ref Range   Creatinine              125              mg/dL      >=79 ==================================================================== Declared Medications:  The flagging and interpretation on this report are based on the  following declared medications.  Unexpected results may arise from  inaccuracies in the declared medications.   **Note: The testing scope of this panel includes these medications:   Butalbital  (Fioricet)  Oxycodone  (Percocet)   **Note: The testing scope of this panel does not include the  following reported medications:   Acetaminophen  (Fioricet)  Acetaminophen  (Percocet)  Aspirin   Baclofen  (Lioresal )  Caffeine  (Fioricet)  Fluticasone  (Flonase )  Meloxicam  (Mobic )  Pantoprazole  (Protonix )  Paroxetine  (Paxil )  Pregabalin  (Lyrica )  Suvorexant  (Belsomra )  Triamcinolone  (Kenalog ) ==================================================================== For clinical consultation, please call [phone]. ====================================================================     No results found for: CBDTHCR No results found for: D8THCCBX No results found for: D9THCCBX  ROS  Constitutional: Denies any fever or chills Gastrointestinal: No reported hemesis, hematochezia, vomiting, or acute GI distress Musculoskeletal: Bilateral hip pain, low back  pain Neurological: No reported episodes of acute onset apraxia, aphasia, dysarthria, agnosia, amnesia, paralysis, loss of coordination, or loss of consciousness  Medication Review  Blood Pressure Kit, PARoxetine , Suvorexant , aspirin  EC, baclofen , butalbital -acetaminophen -caffeine , fluticasone , meloxicam , oxyCODONE -acetaminophen , pantoprazole , pregabalin , and triamcinolone  cream  History Review  Allergy: Ms. Lick is allergic to tizanidine, lisinopril, and tramadol . Drug: Ms. Gregory  reports no history of drug use. Alcohol:  reports that she does not currently use alcohol. Tobacco:  reports that she quit smoking about 55 years ago. Her smoking use included cigarettes. She has been exposed to tobacco smoke. She has never used smokeless tobacco. Social: Ms. Braddy  reports that she quit smoking about 55 years ago. Her smoking use included cigarettes. She has been exposed to tobacco smoke. She has never used smokeless tobacco. She reports that she does not currently use alcohol. She reports that she does not use drugs. 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 Surgical: Ms. Duggin  has a past surgical history that includes Breast biopsy (Right, 2018); Esophagogastroduodenoscopy (egd) with propofol  (N/A, 02/22/2021); Breast biopsy (Left, 07/12/2021); Replacement total knee (Left); Cesarean section; Colonoscopy; Back surgery (2016); Mass excision (Right, 03/05/2023); and Ankle surgery (Right, 08/2023). Family: Family history is unknown  by patient.  Laboratory Chemistry Profile   Renal Lab Results  Component Value Date   BUN 17 10/10/2023   CREATININE 0.66 10/10/2023   BCR SEE NOTE: 10/10/2023   GFRAA 107 03/22/2020   GFRNONAA >60 08/06/2023    Hepatic Lab  Results  Component Value Date   AST 13 10/10/2023   ALT 13 10/10/2023    Electrolytes Lab Results  Component Value Date   NA 137 10/10/2023   K 4.5 10/10/2023   CL 103 10/10/2023   CALCIUM  9.7 10/10/2023    Bone No results found for: VD25OH, VD125OH2TOT, CI6874NY7, CI7874NY7, 25OHVITD1, 25OHVITD2, 25OHVITD3, TESTOFREE, TESTOSTERONE  Inflammation (CRP: Acute Phase) (ESR: Chronic Phase) Lab Results  Component Value Date   CRP 15.2 (H) 12/12/2020   ESRSEDRATE 34 (H) 12/12/2020         Note: Above Lab results reviewed.  Recent Imaging Review  DG Chest 2 View CLINICAL DATA:  Pain  EXAM: CHEST - 2 VIEW  COMPARISON:  None Available.  FINDINGS: The heart size and mediastinal contours are within normal limits. Both lungs are clear. The visualized skeletal structures are unremarkable.  IMPRESSION: No active cardiopulmonary disease.  Electronically Signed   By: Franky Crease M.D.   On: 08/06/2023 22:42 Note: Reviewed        Narrative & Impression  CLINICAL DATA:  Pain.   EXAM: DG HIP (WITH OR WITHOUT PELVIS) 5V BILAT   COMPARISON:  03/09/2018   FINDINGS: There is no evidence of hip fracture or dislocation. Pelvic ring intact. No osteolytic or osteoblastic lesions. Bilateral hip osteoarthritis noted with joint space narrowing small osteophytes.   IMPRESSION: Bilateral hip degenerative changes.     Electronically Signed   By: Fonda Field M.D.   On: 10/13/2022 09:45    Physical Exam  Vitals: BP (!) 159/81 (BP Location: Right Arm, Patient Position: Sitting)   Pulse 79   Temp (!) 97.3 F (36.3 C) (Temporal)   Resp 18   Ht 4' 11 (1.499 m)   Wt 198 lb (89.8 kg)   SpO2 100%   BMI 39.99 kg/m  BMI: Estimated body mass index is 39.99 kg/m as calculated from the following:   Height as of this encounter: 4' 11 (1.499 m).   Weight as of this encounter: 198 lb (89.8 kg). Ideal: Patient must be at least 60 in tall to calculate ideal body  weight General appearance: Well nourished, well developed, and well hydrated. In no apparent acute distress Mental status: Alert, oriented x 3 (person, place, & time)       Respiratory: No evidence of acute respiratory distress Eyes: PERLA  Musculoskeletal: + bilateral hip pain worsening with walking, weightbearing and sitting  Lumbar Exam  Skin & Axial Inspection: No masses, redness, or swelling Alignment: Symmetrical Functional ROM: Pain restricted ROM affecting both sides Stability: No instability detected Muscle Tone/Strength: Functionally intact. No obvious neuro-muscular anomalies detected. Sensory (Neurological): Musculoskeletal pain pattern Palpation: No palpable anomalies       Provocative Tests: Hyperextension/rotation test: deferred today       Lumbar quadrant test (Kemp's test): deferred today       Lateral bending test: deferred today       Patrick's Maneuver: (+) for bilateral hip arthralgia and for bilateral hip arthralgia FABER* test: (+) for bilateral hip arthralgia  *(Flexion, ABduction and External Rotation) Assessment   Diagnosis Status  1. Chronic pain of both hips   2. Chronic bilateral low back pain without sciatica   3. Chronic  pain syndrome   4. Lumbosacral spondylosis without myelopathy   5. Fibromyalgia   6. Myofascial pain syndrome of lumbar spine   7. Medication management   8. Lumbar facet joint syndrome    Persistent Persistent Persistent   Updated Problems: No problems updated.  Plan of Care  Problem-specific:  Assessment and Plan  We will continue on current medication regimen with the change of frequency Percocet 7.5 325 every 12 hours as needed for pain for up to 30 days.  Prescription drug monitoring (PDMP) reviewed; findings consistent with the use of prescribed medication and no evidence of narcotic misuse or abuse.  Schedule follow-up in 30 days for medication management. UDS up to date.  No new issues or problems reported this  visit.   Plan: (Clinic): (B) hip injection # 1 with Dr. Lateef (PO valium )    Ms. Elycia O Runkle has a current medication list which includes the following long-term medication(s): fluticasone , pantoprazole , paroxetine , and pregabalin .  Pharmacotherapy (Medications Ordered): Meds ordered this encounter  Medications   oxyCODONE -acetaminophen  (PERCOCET) 7.5-325 MG tablet    Sig: Take 1 tablet by mouth every 12 (twelve) hours as needed for severe pain (pain score 7-10).    Dispense:  60 tablet    Refill:  0   Orders:  Orders Placed This Encounter  Procedures   HIP INJECTION    Standing Status:   Future    Expiration Date:   02/02/2025    Scheduling Instructions:     Procedure: Intra-articular Hip joint injection     Side: Bilateral      Approach: Lateral     Sedation: With Sedation.     Timeframe: As soon as schedule allows        Return in about 2 weeks (around 02/17/2024) for [facility]): (B) hip injection # 1 with Dr. Lateef  (PO valium ).    Recent Visits Date Type Provider Dept  01/08/24 Office Visit Costella Schwarz K, NP [facility]  12/18/23 Office Visit Shamecca Whitebread K, NP [facility]  11/14/23 Office Visit Chastin Garlitz K, NP [facility]  Showing recent visits within past 90 days and meeting all other requirements Today's Visits Date Type Provider Dept  02/03/24 Office Visit Karoline Fleer K, NP [facility]  Showing today's visits and meeting all other requirements Future Appointments Date Type Provider Dept  02/19/24 Appointment Marcelino Nurse, MD [facility]  02/24/24 Appointment Tenea Sens K, NP [facility]  Showing future appointments within next 90 days and meeting all other requirements  I discussed the assessment and treatment plan with the patient. The patient was provided an opportunity to ask questions and all were answered. The patient agreed with the plan and demonstrated an understanding of the  instructions.  Patient advised to call back or seek an in-person evaluation if the symptoms or condition worsens.  Duration of encounter: 30 minutes.  Total time on encounter, as per AMA guidelines included both the face-to-face and non-face-to-face time personally spent by the physician and/or other qualified health care professional(s) on the day of the encounter (includes time in activities that require the physician or other qualified health care professional and does not include time in activities normally performed by clinical staff). Physician's time may include the following activities when performed: Preparing to see the patient (e.g., pre-charting review of records, searching for previously ordered imaging, lab work, and nerve conduction tests) Review of prior analgesic pharmacotherapies. Reviewing PMP Interpreting ordered tests (e.g., lab  work, imaging, nerve conduction tests) Performing post-procedure evaluations, including interpretation of diagnostic procedures Obtaining and/or reviewing separately obtained history Performing a medically appropriate examination and/or evaluation Counseling and educating the patient/family/caregiver Ordering medications, tests, or procedures Referring and communicating with other health care professionals (when not separately reported) Documenting clinical information in the electronic or other health record Independently interpreting results (not separately reported) and communicating results to the patient/ family/caregiver Care coordination (not separately reported)  Note by: Noah Pelaez K Melaine Mcphee, NP (TTS and AI technology used. I apologize for any typographical errors that were not detected and corrected.) Date: 02/03/2024; Time: 11:34 AM

## 2024-02-05 ENCOUNTER — Ambulatory Visit
Admission: RE | Admit: 2024-02-05 | Discharge: 2024-02-05 | Disposition: A | Discharge status: Home / Self Care | Source: Ambulatory Visit | Attending: Internal Medicine | Admitting: Internal Medicine

## 2024-02-05 DIAGNOSIS — Z1231 Encounter for screening mammogram for malignant neoplasm of breast: Secondary | ICD-10-CM | POA: Insufficient documentation

## 2024-02-19 ENCOUNTER — Ambulatory Visit
Admission: RE | Admit: 2024-02-19 | Discharge: 2024-02-19 | Disposition: A | Discharge status: Home / Self Care | Source: Ambulatory Visit | Attending: Student in an Organized Health Care Education/Training Program | Admitting: Student in an Organized Health Care Education/Training Program

## 2024-02-19 ENCOUNTER — Encounter: Payer: Self-pay | Admitting: Student in an Organized Health Care Education/Training Program

## 2024-02-19 ENCOUNTER — Ambulatory Visit: Admitting: Student in an Organized Health Care Education/Training Program

## 2024-02-19 VITALS — BP 149/80 | HR 76 | Temp 97.1°F | Resp 15 | Ht 59.0 in | Wt 193.0 lb

## 2024-02-19 DIAGNOSIS — M25552 Pain in left hip: Secondary | ICD-10-CM | POA: Diagnosis present

## 2024-02-19 DIAGNOSIS — G8929 Other chronic pain: Secondary | ICD-10-CM | POA: Insufficient documentation

## 2024-02-19 DIAGNOSIS — M160 Bilateral primary osteoarthritis of hip: Secondary | ICD-10-CM

## 2024-02-19 DIAGNOSIS — M25551 Pain in right hip: Secondary | ICD-10-CM | POA: Insufficient documentation

## 2024-02-19 MED ORDER — IOHEXOL 180 MG/ML  SOLN
10.0000 mL | Freq: Once | INTRAMUSCULAR | Status: AC
  Administered 2024-02-19: 10 mL via INTRA_ARTICULAR
  Filled 2024-02-19: qty 20

## 2024-02-19 MED ORDER — LIDOCAINE HCL 2 % IJ SOLN
20.0000 mL | Freq: Once | INTRAMUSCULAR | Status: AC
  Administered 2024-02-19: 400 mg
  Filled 2024-02-19: qty 20

## 2024-02-19 MED ORDER — ROPIVACAINE HCL 2 MG/ML IJ SOLN
9.0000 mL | Freq: Once | INTRAMUSCULAR | Status: AC
  Administered 2024-02-19: 9 mL via INTRA_ARTICULAR
  Filled 2024-02-19: qty 20

## 2024-02-19 MED ORDER — METHYLPREDNISOLONE ACETATE 80 MG/ML IJ SUSP
80.0000 mg | Freq: Once | INTRAMUSCULAR | Status: AC
  Administered 2024-02-19: 80 mg via INTRA_ARTICULAR
  Filled 2024-02-19: qty 1

## 2024-02-19 MED ORDER — DIAZEPAM 5 MG PO TABS
5.0000 mg | ORAL_TABLET | ORAL | Status: AC
  Administered 2024-02-19: 5 mg via ORAL

## 2024-02-19 MED ORDER — DIAZEPAM 5 MG PO TABS
ORAL_TABLET | ORAL | Status: AC
  Filled 2024-02-19: qty 1

## 2024-02-19 NOTE — Patient Instructions (Signed)

## 2024-02-19 NOTE — Progress Notes (Signed)
 PROVIDER NOTE: Interpretation of information contained herein should be left to medically-trained personnel. Specific patient instructions are provided elsewhere under Patient Instructions section of medical record. This document was created in part using STT-dictation technology, any transcriptional errors that may result from this process are unintentional.  Patient: Kristin Porter Type: Established DOB: 06-15-1960 MRN: [id number] PCP: Antonette Angeline ORN, NP  Service: Procedure DOS: 02/19/2024 Setting: Ambulatory Location: Ambulatory outpatient facility Delivery: Face-to-face Provider: Wallie Sherry, MD Specialty: Interventional Pain Management Specialty designation: 09 Location: Outpatient facility Ref. Prov.: Antonette Angeline ORN, NP       Interventional Therapy   Procedure: Intra-articular hip injection  #1  Laterality: Bilateral (-50)  Approach: Percutaneous anterolateral approach. Level: Lower pelvic and hip joint level.  Imaging: Fluoroscopy-guided         Anesthesia: Local anesthesia (1-2% Lidocaine ) Sedation: Minimal Sedation                       DOS: 02/19/2024  Performed by: Wallie Sherry, MD  Purpose: Diagnostic/Therapeutic Indications: Hip pain severe enough to impact quality of life or function. Rationale (medical necessity): procedure needed and proper for the diagnosis and/or treatment of Kristin Porter's medical symptoms and needs. 1. Chronic pain of both hips   2. Primary osteoarthritis of both hips    NAS-11 Pain score:   Pre-procedure: 7 /10   Post-procedure: 6  (standing and walking)/10      Target: Intra-articular hip joint Region: Hip joint, upper (proximal) femoral region Type of procedure: Percutaneous joint injection   Position / Prep / Materials:  Position: Supine  Prep solution: ChloraPrep (2% chlorhexidine  gluconate and 70% isopropyl alcohol) Prep Area:  Entire Anterolateral hip area. Materials:  Tray: Block tray Needle(s):  Type: Spinal  Gauge  (G): 22  Length: 5-in  Qty: 1  H&P (Pre-op Assessment):  Kristin Porter is a 63 y.o. (year old), female patient, seen today for interventional treatment. She  has a past surgical history that includes Breast biopsy (Right, 2018); Esophagogastroduodenoscopy (egd) with propofol  (N/A, 02/22/2021); Breast biopsy (Left, 07/12/2021); Replacement total knee (Left); Cesarean section; Colonoscopy; Back surgery (2016); Mass excision (Right, 03/05/2023); and Ankle surgery (Right, 08/2023). Kristin Porter has a current medication list which includes the following prescription(s): aspirin  ec, baclofen , blood pressure kit, butalbital -acetaminophen -caffeine , fluticasone , meloxicam , oxycodone -acetaminophen , pantoprazole , paroxetine , pregabalin , belsomra , and triamcinolone  cream. Her primarily concern today is the Hip Pain (bilateral)  Initial Vital Signs:  Pulse/HCG Rate: 76ECG Heart Rate: 79 Temp: (!) 97.1 F (36.2 C) Resp: 16 BP: (!) 162/83 SpO2: 97 %  BMI: Estimated body mass index is 38.98 kg/m as calculated from the following:   Height as of this encounter: 4' 11 (1.499 m).   Weight as of this encounter: 193 lb (87.5 kg).  Risk Assessment: Allergies: Reviewed. She is allergic to tizanidine, lisinopril, and tramadol .  Allergy Precautions: None required Coagulopathies: Reviewed. None identified.  Blood-thinner therapy: None at this time Active Infection(s): Reviewed. None identified. Kristin Porter is afebrile  Site Confirmation: Kristin Porter was asked to confirm the procedure and laterality before marking the site Procedure checklist: Completed Consent: Before the procedure and under the influence of no sedative(s), amnesic(s), or anxiolytics, the patient was informed of the treatment options, risks and possible complications. To fulfill our ethical and legal obligations, as recommended by the American Medical Association's Code of Ethics, I have informed the patient of my clinical impression; the nature and  purpose of the treatment or procedure; the risks, benefits, and possible complications  of the intervention; the alternatives, including doing nothing; the risk(s) and benefit(s) of the alternative treatment(s) or procedure(s); and the risk(s) and benefit(s) of doing nothing. The patient was provided information about the general risks and possible complications associated with the procedure. These may include, but are not limited to: failure to achieve desired goals, infection, bleeding, organ or nerve damage, allergic reactions, paralysis, and death. In addition, the patient was informed of those risks and complications associated to the procedure, such as failure to decrease pain; infection; bleeding; organ or nerve damage with subsequent damage to sensory, motor, and/or autonomic systems, resulting in permanent pain, numbness, and/or weakness of one or several areas of the body; allergic reactions; (i.e.: anaphylactic reaction); and/or death. Furthermore, the patient was informed of those risks and complications associated with the medications. These include, but are not limited to: allergic reactions (i.e.: anaphylactic or anaphylactoid reaction(s)); adrenal axis suppression; blood sugar elevation that in diabetics may result in ketoacidosis or comma; water retention that in patients with history of congestive heart failure may result in shortness of breath, pulmonary edema, and decompensation with resultant heart failure; weight gain; swelling or edema; medication-induced neural toxicity; particulate matter embolism and blood vessel occlusion with resultant organ, and/or nervous system infarction; and/or aseptic necrosis of one or more joints. Finally, the patient was informed that Medicine is not an exact science; therefore, there is also the possibility of unforeseen or unpredictable risks and/or possible complications that may result in a catastrophic outcome. The patient indicated having understood very  clearly. We have given the patient no guarantees and we have made no promises. Enough time was given to the patient to ask questions, all of which were answered to the patient's satisfaction. Kristin Porter has indicated that she wanted to continue with the procedure. Attestation: I, the ordering provider, attest that I have discussed with the patient the benefits, risks, side-effects, alternatives, likelihood of achieving goals, and potential problems during recovery for the procedure that I have provided informed consent. Date  Time: 02/19/2024 11:35 AM  Pre-Procedure Preparation:  Monitoring: As per clinic protocol. Respiration, ETCO2, SpO2, BP, heart rate and rhythm monitor placed and checked for adequate function Safety Precautions: Patient was assessed for positional comfort and pressure points before starting the procedure. Time-out: I initiated and conducted the Time-out before starting the procedure, as per protocol. The patient was asked to participate by confirming the accuracy of the Time Out information. Verification of the correct person, site, and procedure were performed and confirmed by me, the nursing staff, and the patient. Time-out conducted as per Joint Commission's Universal Protocol (UP.01.01.01). Time: 1157 Start Time: 1157 hrs.  Description/Narrative of Procedure:          Rationale (medical necessity): procedure needed and proper for the diagnosis and/or treatment of the patient's medical symptoms and needs. Procedural Technique Safety Precautions: Aspiration looking for blood return was conducted prior to all injections. At no point did we inject any substances, as a needle was being advanced. No attempts were made at seeking any paresthesias. Safe injection practices and needle disposal techniques used. Medications properly checked for expiration dates. SDV (single dose vial) medications used. Description of the Procedure: Protocol guidelines were followed. The patient  was assisted into a comfortable position. The target area was identified and the area prepped in the usual manner. Skin & deeper tissues infiltrated with local anesthetic. Appropriate amount of time allowed to pass for local anesthetics to take effect. The procedure needles were then advanced to the  target area. Proper needle placement secured. Negative aspiration confirmed. Solution injected in intermittent fashion, asking for systemic symptoms every 0.5cc of injectate. The needles were then removed and the area cleansed, making sure to leave some of the prepping solution back to take advantage of its long term bactericidal properties.  Technical description of procedure:  Skin & deeper tissues infiltrated with local anesthetic. Appropriate amount of time allowed to pass for local anesthetics to take effect. The procedure needles were then advanced to the target area. Proper needle placement secured. Negative aspiration confirmed. Solution injected in intermittent fashion, asking for systemic symptoms every 0.5cc of injectate. The needles were then removed and the area cleansed, making sure to leave some of the prepping solution back to take advantage of its long term bactericidal properties.  5 cc of nerve block solution injected into the left hip, 5 cc of nerve block solution injected into the right hip           Vitals:   02/19/24 1137 02/19/24 1153 02/19/24 1200 02/19/24 1207  BP: (!) 162/83 (!) 154/74 (!) 146/80 (!) 149/80  Pulse: 76     Resp: 16 17 16 15   Temp: (!) 97.1 F (36.2 C)     TempSrc: Temporal     SpO2: 97% 98% 99% 99%  Weight: 193 lb (87.5 kg)     Height: 4' 11 (1.499 m)        Start Time: 1157 hrs. End Time: 1203 hrs.  Imaging Guidance (Non-Spinal):          Type of Imaging Technique: Fluoroscopy Guidance (Non-Spinal) Indication(s): Fluoroscopy guidance for needle placement to enhance accuracy in procedures requiring precise needle localization for targeted delivery  of medication in or near specific anatomical locations not easily accessible without such real-time imaging assistance. Exposure Time: Please see nurses notes. Contrast: Before injecting any contrast, we confirmed that the patient did not have an allergy to iodine, shellfish, or radiological contrast. Once satisfactory needle placement was completed at the desired level, radiological contrast was injected. Contrast injected under live fluoroscopy. No contrast complications. See chart for type and volume of contrast used. Fluoroscopic Guidance: I was personally present during the use of fluoroscopy. Tunnel Vision Technique used to obtain the best possible view of the target area. Parallax error corrected before commencing the procedure. Direction-depth-direction technique used to introduce the needle under continuous pulsed fluoroscopy. Once target was reached, antero-posterior, oblique, and lateral fluoroscopic projection used confirm needle placement in all planes. Images permanently stored in EMR. Interpretation: I personally interpreted the imaging intraoperatively. Adequate needle placement confirmed in multiple planes. Appropriate spread of contrast into desired area was observed. No evidence of afferent or efferent intravascular uptake. Permanent images saved into the patient's record.  Post-operative Assessment:  Post-procedure Vital Signs:  Pulse/HCG Rate: 7673 Temp: (!) 97.1 F (36.2 C) Resp: 15 BP: (!) 149/80 SpO2: 99 %  EBL: None  Complications: No immediate post-treatment complications observed by team, or reported by patient.  Note: The patient tolerated the entire procedure well. A repeat set of vitals were taken after the procedure and the patient was kept under observation following institutional policy, for this type of procedure. Post-procedural neurological assessment was performed, showing return to baseline, prior to discharge. The patient was provided with post-procedure  discharge instructions, including a section on how to identify potential problems. Should any problems arise concerning this procedure, the patient was given instructions to immediately contact us , at any time, without hesitation. In any case, we plan  to contact the patient by telephone for a follow-up status report regarding this interventional procedure.  Comments:  No additional relevant information.  Plan of Care (POC)  Orders:  Orders Placed This Encounter  Procedures   DG PAIN CLINIC C-ARM 1-60 MIN NO REPORT    Intraoperative interpretation by procedural physician at [facility].    Standing Status:   Standing    Number of Occurrences:   1    Reason for exam::   Assistance in needle guidance and placement for procedures requiring needle placement in or near specific anatomical locations not easily accessible without such assistance.     Medications ordered for procedure: Meds ordered this encounter  Medications   iohexol (OMNIPAQUE) 180 MG/ML injection 10 mL    Must be Myelogram-compatible. If not available, you may substitute with a water-soluble, non-ionic, hypoallergenic, myelogram-compatible radiological contrast medium.   lidocaine  (XYLOCAINE ) 2 % (with pres) injection 400 mg   diazepam  (VALIUM ) tablet 5 mg    Make sure Flumazenil is available in the pyxis when using this medication. If oversedation occurs, administer 0.2 mg IV over 15 sec. If after 45 sec no response, administer 0.2 mg again over 1 min; may repeat at 1 min intervals; not to exceed 4 doses (1 mg)   ropivacaine  (PF) 2 mg/mL (0.2%) (NAROPIN ) injection 9 mL   methylPREDNISolone acetate (DEPO-MEDROL) injection 80 mg   Medications administered: We administered iohexol, lidocaine , diazepam , ropivacaine  (PF) 2 mg/mL (0.2%), and methylPREDNISolone acetate.  See the medical record for exact dosing, route, and time of administration.    Follow-up plan:   Return for Keep sch. appt.     Recent  Visits Date Type Provider Dept  02/03/24 Office Visit Patel, Seema K, NP [facility]  01/08/24 Office Visit Patel, Seema K, NP [facility]  12/18/23 Office Visit Patel, Seema K, NP [facility]  Showing recent visits within past 90 days and meeting all other requirements Today's Visits Date Type Provider Dept  02/19/24 Procedure visit Marcelino Nurse, MD [facility]  Showing today's visits and meeting all other requirements Future Appointments Date Type Provider Dept  02/24/24 Appointment Patel, Seema K, NP [facility]  Showing future appointments within next 90 days and meeting all other requirements   Disposition: Discharge home  Discharge (Date  Time): 02/19/2024; 1209 hrs.   Primary Care Physician: Antonette Angeline ORN, NP Location: [facility] Note by: Nurse Marcelino, MD (TTS technology used. I apologize for any typographical errors that were not detected and corrected.) Date: 02/19/2024; Time: 1:13 PM  Disclaimer:  Medicine is not an Visual merchandiser. The only guarantee in medicine is that nothing is guaranteed. It is important to note that the decision to proceed with this intervention was based on the information collected from the patient. The Data and conclusions were drawn from the patient's questionnaire, the interview, and the physical examination. Because the information was provided in large part by the patient, it cannot be guaranteed that it has not been purposely or unconsciously manipulated. Every effort has been made to obtain as much relevant data as possible for this evaluation. It is important to note that the conclusions that lead to this procedure are derived in large part from the available data. Always take into account that the treatment will also be dependent on availability of resources and existing treatment guidelines, considered by other Pain Management Practitioners as being common knowledge  and practice, at the time of the  intervention. For Medico-Legal purposes, it is also important to point out that variation in procedural techniques and pharmacological choices are the acceptable norm. The indications, contraindications, technique, and results of the above procedure should only be interpreted and judged by a Board-Certified Interventional Pain Specialist with extensive familiarity and expertise in the same exact procedure and technique.

## 2024-02-20 ENCOUNTER — Telehealth: Payer: Self-pay

## 2024-02-20 NOTE — Telephone Encounter (Signed)
 Post procedue follow up.  States she is doing fine.

## 2024-02-24 ENCOUNTER — Ambulatory Visit: Attending: Nurse Practitioner | Admitting: Nurse Practitioner

## 2024-02-24 ENCOUNTER — Encounter: Payer: Self-pay | Admitting: Nurse Practitioner

## 2024-02-24 VITALS — BP 144/80 | HR 63 | Temp 97.3°F | Resp 16 | Ht 59.0 in | Wt 193.0 lb

## 2024-02-24 DIAGNOSIS — M7918 Myalgia, other site: Secondary | ICD-10-CM | POA: Insufficient documentation

## 2024-02-24 DIAGNOSIS — G894 Chronic pain syndrome: Secondary | ICD-10-CM | POA: Insufficient documentation

## 2024-02-24 DIAGNOSIS — M47816 Spondylosis without myelopathy or radiculopathy, lumbar region: Secondary | ICD-10-CM | POA: Diagnosis present

## 2024-02-24 DIAGNOSIS — M797 Fibromyalgia: Secondary | ICD-10-CM | POA: Diagnosis present

## 2024-02-24 DIAGNOSIS — Z79899 Other long term (current) drug therapy: Secondary | ICD-10-CM | POA: Diagnosis present

## 2024-02-24 DIAGNOSIS — M160 Bilateral primary osteoarthritis of hip: Secondary | ICD-10-CM | POA: Diagnosis present

## 2024-02-24 MED ORDER — OXYCODONE-ACETAMINOPHEN 7.5-325 MG PO TABS: 1.0000 | ORAL_TABLET | Freq: Two times a day (BID) | ORAL | 0 refills | Status: AC | PRN

## 2024-02-24 MED ORDER — BACLOFEN 20 MG PO TABS: 20.0000 mg | ORAL_TABLET | Freq: Three times a day (TID) | ORAL | 1 refills | Status: AC

## 2024-02-24 NOTE — Progress Notes (Signed)
 PROVIDER NOTE: Interpretation of information contained herein should be left to medically-trained personnel. Specific patient instructions are provided elsewhere under Patient Instructions section of medical record. This document was created in part using AI and STT-dictation technology, any transcriptional errors that may result from this process are unintentional.  Patient: Kristin Porter Coins  Service: E/M   PCP: Antonette Angeline ORN, NP  DOB: 03/31/1961  DOS: 02/24/2024  Provider: Emmy MARLA Blanch, NP  MRN: [id number]  Delivery: Face-to-face  Specialty: Interventional Pain Management  Type: Established Patient  Setting: Ambulatory outpatient facility  Specialty designation: 09  Referring Prov.: Antonette Angeline ORN, NP  Location: Outpatient office facility       History of present illness (HPI) [name], a 63 y.o. year old female, is here today because of her medication management and hip pain. [name] primary complain today is Hip Pain (Bilateral, better after initial procedure. )  Pertinent problems: [name] does not have any pertinent problems on file.  Pain Assessment: Severity of Chronic pain is reported as a 5 /10. Location: Hip Left, Right/down legs sometimes. Onset: More than a month ago. Quality: Discomfort, Aching. Timing: Intermittent. Modifying factor(s): injection helped. Vitals:  height is 4' 11 (1.499 m) and weight is 193 lb (87.5 kg). Her temporal temperature is 97.3 F (36.3 C) (abnormal). Her blood pressure is 144/80 (abnormal) and her pulse is 63. Her respiration is 16 and oxygen  saturation is 93%.  BMI: Estimated body mass index is 38.98 kg/m as calculated from the following:   Height as of this encounter: 4' 11 (1.499 m).   Weight as of this encounter: 193 lb (87.5 kg).  Last encounter: 02/03/2024. Last procedure: 02/19/2024  Reason for encounter: medication management. No change in medical history since last visit.  Patient's pain is at baseline.  Patient  continues multimodal pain regimen as prescribed.  States that it provides pain relief and improvement in functional status.  Pharmacotherapy Assessment   Analgesic: Oxycodone -acetaminophen  (Percocet) 7.5-325 mg tablet every 12 hours as needed for pain. MME=22.50 Monitoring:  PMP: PDMP reviewed during this encounter.       Pharmacotherapy: No side-effects or adverse reactions reported. Compliance: No problems identified. Effectiveness: Clinically acceptable.  Jakie Chrissie MATSU, RN  02/24/2024 11:31 AM  Sign when Signing Visit Nursing Pain Medication Assessment:  Safety precautions to be maintained throughout the outpatient stay will include: orient to surroundings, keep bed in low position, maintain call bell within reach at all times, provide assistance with transfer out of bed and ambulation.  Medication Inspection Compliance: Pill count conducted under aseptic conditions, in front of the patient. Neither the pills nor the bottle was removed from the patient's sight at any time. Once count was completed pills were immediately returned to the patient in their original bottle.  Medication: Oxycodone /APAP Pill/Patch Count: 8 of 60 pills/patches remain Pill/Patch Appearance: Markings consistent with prescribed medication Bottle Appearance: Standard pharmacy container. Clearly labeled. Filled Date: 08 / 20 / 2025 Last Medication intake:  Yesterday    UDS:  Summary  Date Value Ref Range Status  11/14/2023 FINAL  Final    Comment:    ==================================================================== [employer] Select 13 (MW) ==================================================================== Test                             Result       Flag       Units  Drug Absent but Declared for Prescription Verification   Oxycodone   Not Detected UNEXPECTED ng/mg creat   Butalbital                      Not Detected  UNEXPECTED ==================================================================== Test                      Result    Flag   Units      Ref Range   Creatinine              125              mg/dL      >=79 ==================================================================== Declared Medications:  The flagging and interpretation on this report are based on the  following declared medications.  Unexpected results may arise from  inaccuracies in the declared medications.   **Note: The testing scope of this panel includes these medications:   Butalbital  (Fioricet)  Oxycodone  (Percocet)   **Note: The testing scope of this panel does not include the  following reported medications:   Acetaminophen  (Fioricet)  Acetaminophen  (Percocet)  Aspirin   Baclofen  (Lioresal )  Caffeine  (Fioricet)  Fluticasone  (Flonase )  Meloxicam  (Mobic )  Pantoprazole  (Protonix )  Paroxetine  (Paxil )  Pregabalin  (Lyrica )  Suvorexant  (Belsomra )  Triamcinolone  (Kenalog ) ==================================================================== For clinical consultation, please call [phone]. ====================================================================     No results found for: CBDTHCR No results found for: D8THCCBX No results found for: D9THCCBX  ROS  Constitutional: Denies any fever or chills Gastrointestinal: No reported hemesis, hematochezia, vomiting, or acute GI distress Musculoskeletal: Hip pain (bilateral) Neurological: No reported episodes of acute onset apraxia, aphasia, dysarthria, agnosia, amnesia, paralysis, loss of coordination, or loss of consciousness  Medication Review  Blood Pressure Kit, PARoxetine , Suvorexant , aspirin  EC, baclofen , butalbital -acetaminophen -caffeine , fluticasone , meloxicam , oxyCODONE -acetaminophen , pantoprazole , pregabalin , and triamcinolone  cream  History Review  Allergy: [name] is allergic to tizanidine, lisinopril, and tramadol . Drug: [name]   reports no history of drug use. Alcohol:  reports that she does not currently use alcohol. Tobacco:  reports that she quit smoking about 55 years ago. Her smoking use included cigarettes. She has been exposed to tobacco smoke. She has never used smokeless tobacco. Social: Ms. Geisler  reports that she quit smoking about 55 years ago. Her smoking use included cigarettes. She has been exposed to tobacco smoke. She has never used smokeless tobacco. She reports that she does not currently use alcohol. She reports that she does not use drugs. 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 Surgical: Ms. Stitzer  has a past surgical history that includes Breast biopsy (Right, 2018); Esophagogastroduodenoscopy (egd) with propofol  (N/A, 02/22/2021); Breast biopsy (Left, 07/12/2021); Replacement total knee (Left); Cesarean section; Colonoscopy; Back surgery (2016); Mass excision (Right, 03/05/2023); and Ankle surgery (Right, 08/2023). Family: Family history is unknown by patient.  Laboratory Chemistry Profile   Renal Lab Results  Component Value Date   BUN 17 10/10/2023   CREATININE 0.66 10/10/2023   BCR SEE NOTE: 10/10/2023   GFRAA 107 03/22/2020   GFRNONAA >60 08/06/2023    Hepatic Lab Results  Component Value Date   AST 13 10/10/2023   ALT 13 10/10/2023    Electrolytes Lab Results  Component Value Date   NA 137 10/10/2023   K 4.5 10/10/2023   CL 103 10/10/2023   CALCIUM  9.7 10/10/2023    Bone No results found for: VD25OH,  CI874NY7UNU, CI6874NY7, CI7874NY7, 25OHVITD1, 25OHVITD2, 25OHVITD3, TESTOFREE, TESTOSTERONE  Inflammation (CRP: Acute Phase) (ESR: Chronic Phase) Lab Results  Component Value Date   CRP 15.2 (H) 12/12/2020   ESRSEDRATE 34  (H) 12/12/2020         Note: Above Lab results reviewed.  Recent Imaging Review  DG PAIN CLINIC C-ARM 1-60 MIN NO REPORT Fluoro was used, but no Radiologist interpretation will be provided.  Please refer to NOTES tab for provider progress note. Note: Reviewed        Physical Exam  Vitals: BP (!) 144/80 (BP Location: Right Arm, Patient Position: Sitting, Cuff Size: Large)   Pulse 63   Temp (!) 97.3 F (36.3 C) (Temporal)   Resp 16   Ht 4' 11 (1.499 m)   Wt 193 lb (87.5 kg)   SpO2 93%   BMI 38.98 kg/m  BMI: Estimated body mass index is 38.98 kg/m as calculated from the following:   Height as of this encounter: 4' 11 (1.499 m).   Weight as of this encounter: 193 lb (87.5 kg). Ideal: Patient must be at least 60 in tall to calculate ideal body weight General appearance: Well nourished, well developed, and well hydrated. In no apparent acute distress Mental status: Alert, oriented x 3 (person, place, & time)       Respiratory: No evidence of acute respiratory distress Eyes: PERLA  Musculoskeletal: + Bilateral hip pain (Improving after Hip injection) Assessment   Diagnosis Status  1. Primary osteoarthritis of both hips   2. Chronic pain syndrome   3. Fibromyalgia   4. Myofascial pain syndrome of lumbar spine   5. Medication management   6. Lumbar facet joint syndrome    Improving Controlled Controlled   Updated Problems: No problems updated.  Plan of Care  Problem-specific:  Assessment and Plan Primary osteoarthritis of both hips: The patient has a history of severe osteoarthritis of both hips.  Following the hip injection, she reports improvement in pain and functional capacity, and is now able to walk longer distance with less discomfort.  Chronic pain syndrome: Patient's pain is controlled on oxycodone , will continue on current medication regimen.  Prescribing drug monitoring (PDMP) reviewed, findings consistent with the use of prescribed medication and no  evidence of narcotic misuse or abuse.  Urine drug screening (UDS) up-to-date.  Advised patient to walk for at least 30-minute to improve in functional capacity after hip injection.  Schedule follow-up in 60 days for medication management.   Ms. ABIGAELLE VERLEY has a current medication list which includes the following long-term medication(s): fluticasone , pantoprazole , paroxetine , and pregabalin .  Pharmacotherapy (Medications Ordered): Meds ordered this encounter  Medications   oxyCODONE -acetaminophen  (PERCOCET) 7.5-325 MG tablet    Sig: Take 1 tablet by mouth every 12 (twelve) hours as needed for severe pain (pain score 7-10).    Dispense:  60 tablet    Refill:  0   oxyCODONE -acetaminophen  (PERCOCET) 7.5-325 MG tablet    Sig: Take 1 tablet by mouth every 12 (twelve) hours as needed for severe pain (pain score 7-10).    Dispense:  60 tablet    Refill:  0   baclofen  (LIORESAL ) 20 MG tablet    Sig: Take 1 tablet (20 mg total) by mouth 3 (three) times daily.    Dispense:  90 tablet    Refill:  1    Do not place this medication, or any other prescription from our practice, on Automatic Refill. Patient may have prescription filled one day early if  pharmacy is closed on scheduled refill date.   Orders:  No orders of the defined types were placed in this encounter.       Return in about 1 month (around 03/26/2024) for (VV), Emmy Blanch NP for PPE .    Recent Visits Date Type Provider Dept  02/19/24 Procedure visit Marcelino Nurse, MD [facility]  02/03/24 Office Visit Dalonte Hardage K, NP [facility]  01/08/24 Office Visit Chief Walkup K, NP [facility]  12/18/23 Office Visit Rayden Dock K, NP [facility]  Showing recent visits within past 90 days and meeting all other requirements Today's Visits Date Type Provider Dept  02/24/24 Office Visit Parnika Tweten K, NP [facility]  Showing today's visits and meeting all other  requirements Future Appointments Date Type Provider Dept  03/24/24 Appointment Arlyn Bumpus K, NP [facility]  04/23/24 Appointment Ayahna Solazzo K, NP [facility]  Showing future appointments within next 90 days and meeting all other requirements  I discussed the assessment and treatment plan with the patient. The patient was provided an opportunity to ask questions and all were answered. The patient agreed with the plan and demonstrated an understanding of the instructions.  Patient advised to call back or seek an in-person evaluation if the symptoms or condition worsens.  I personally spent a total of 30 minutes in the care of the patient today including preparing to see the patient, getting/reviewing separately obtained history, performing a medically appropriate exam/evaluation, counseling and educating, placing orders, referring and communicating with other health care professionals, documenting clinical information in the EHR, independently interpreting results, communicating results, and coordinating care.   Note by: Emmy MARLA Blanch, NP  Date: 02/24/2024; Time: 3:03 PM

## 2024-02-24 NOTE — Progress Notes (Signed)
 Nursing Pain Medication Assessment:  Safety precautions to be maintained throughout the outpatient stay will include: orient to surroundings, keep bed in low position, maintain call bell within reach at all times, provide assistance with transfer out of bed and ambulation.  Medication Inspection Compliance: Pill count conducted under aseptic conditions, in front of the patient. Neither the pills nor the bottle was removed from the patient's sight at any time. Once count was completed pills were immediately returned to the patient in their original bottle.  Medication: Oxycodone /APAP Pill/Patch Count: 8 of 60 pills/patches remain Pill/Patch Appearance: Markings consistent with prescribed medication Bottle Appearance: Standard pharmacy container. Clearly labeled. Filled Date: 08 / 20 / 2025 Last Medication intake:  Yesterday

## 2024-03-12 ENCOUNTER — Ambulatory Visit: Payer: Self-pay

## 2024-03-12 NOTE — Telephone Encounter (Signed)
 FYI Only or Action Required?: FYI only for provider.  Patient was last seen in primary care on 10/10/2023 by Antonette Angeline ORN, NP.  Called Nurse Triage reporting Pain.  Symptoms began chronic problem.  Symptoms are: gradually worsening.  Triage Disposition: See PCP Within 2 Weeks  Patient/caregiver understands and will follow disposition?: Yes      Copied from CRM [id number]. Topic: Clinical - Red Word Triage >> Mar 12, 2024  1:47 PM Gustabo D wrote: Patient has lingering headaches, chronic pain in eyes, face and cheeks, and jaws like muscle spasms and in both hands- says her fingers stick together. Fibromyalgia       Reason for Disposition  Body pains are a chronic symptom (recurrent or ongoing AND present > 4 weeks)  Answer Assessment - Initial Assessment Questions 1. ONSET: When did the muscle aches or body pains start?      Chronic problem  2. LOCATION: What part of your body is hurting? (e.g., entire body, arms, legs)      Head, face, cheeks, jaw, and hands  3. SEVERITY: How bad is the pain? (Scale 1-10; or mild, moderate, severe)     Moderate  4. CAUSE: What do you think is causing the pains?     Fibromyalgia  5. FEVER: Do you have a fever? If Yes, ask: What is your temperature, how was it measured, and  when did it start?      No 6. OTHER SYMPTOMS: Do you have any other symptoms? (e.g., chest pain, cold or flu symptoms, rash, weakness, weight loss)     Headache, pain in eyes, face, and cheeks,muscle spasms in both hands.  Protocols used: Muscle Aches and Body Pain-A-AH

## 2024-03-13 ENCOUNTER — Encounter: Payer: Self-pay | Admitting: Internal Medicine

## 2024-03-13 ENCOUNTER — Ambulatory Visit (INDEPENDENT_AMBULATORY_CARE_PROVIDER_SITE_OTHER): Admitting: Internal Medicine

## 2024-03-13 VITALS — BP 140/80 | Ht 59.0 in | Wt 200.0 lb

## 2024-03-13 DIAGNOSIS — R5383 Other fatigue: Secondary | ICD-10-CM | POA: Diagnosis not present

## 2024-03-13 DIAGNOSIS — E538 Deficiency of other specified B group vitamins: Secondary | ICD-10-CM | POA: Diagnosis not present

## 2024-03-13 DIAGNOSIS — M797 Fibromyalgia: Secondary | ICD-10-CM

## 2024-03-13 DIAGNOSIS — E559 Vitamin D deficiency, unspecified: Secondary | ICD-10-CM

## 2024-03-13 DIAGNOSIS — M25551 Pain in right hip: Secondary | ICD-10-CM

## 2024-03-13 DIAGNOSIS — G8929 Other chronic pain: Secondary | ICD-10-CM

## 2024-03-13 DIAGNOSIS — G894 Chronic pain syndrome: Secondary | ICD-10-CM

## 2024-03-13 DIAGNOSIS — M25552 Pain in left hip: Secondary | ICD-10-CM

## 2024-03-13 DIAGNOSIS — M47817 Spondylosis without myelopathy or radiculopathy, lumbosacral region: Secondary | ICD-10-CM

## 2024-03-13 NOTE — Patient Instructions (Signed)
Myofascial Pain Syndrome and Fibromyalgia Myofascial pain syndrome and fibromyalgia are both pain disorders. You may feel this pain mainly in your muscles. Myofascial pain syndrome: Always has tender points in the muscles that will cause pain when pressed (trigger points). The pain may come and go. Usually affects your neck, upper back, and shoulder areas. The pain often moves into your arms and hands. Fibromyalgia: Has muscle pains and tenderness that come and go. Is often associated with tiredness (fatigue) and sleep problems. Has trigger points. Tends to be long-lasting (chronic), but is not life-threatening. Fibromyalgia and myofascial pain syndrome are not the same. However, they often occur together. If you have both conditions, each can make the other worse. Both are common and can cause enough pain and fatigue to make day-to-day activities difficult. Both can be hard to diagnose because their symptoms are common in many other conditions. What are the causes? The exact causes of these conditions are not known. What increases the risk? You are more likely to develop either of these conditions if: You have a family history of the condition. You are female. You have certain triggers, such as: Spine disorders. An injury (trauma) or other physical stressors. Being under a lot of stress. Medical conditions such as osteoarthritis, rheumatoid arthritis, or lupus. What are the signs or symptoms? Fibromyalgia The main symptom of fibromyalgia is widespread pain and tenderness in your muscles. Pain is sometimes described as stabbing, shooting, or burning. You may also have: Tingling or numbness. Sleep problems and fatigue. Problems with attention and concentration (fibro fog). Other symptoms may include: Bowel and bladder problems. Headaches. Vision problems. Sensitivity to odors and noises. Depression or mood changes. Painful menstrual periods (dysmenorrhea). Dry skin or eyes. These  symptoms can vary over time. Myofascial pain syndrome Symptoms of myofascial pain syndrome include: Tight, ropy bands of muscle. Uncomfortable sensations in muscle areas. These may include aching, cramping, burning, numbness, tingling, and weakness. Difficulty moving certain parts of the body freely (poor range of motion). How is this diagnosed? This condition may be diagnosed by your symptoms and medical history. You will also have a physical exam. In general: Fibromyalgia is diagnosed if you have pain, fatigue, and other symptoms for more than 3 months, and symptoms cannot be explained by another condition. Myofascial pain syndrome is diagnosed if you have trigger points in your muscles, and those trigger points are tender and cause pain elsewhere in your body (referred pain). How is this treated? Treatment for these conditions depends on the type that you have. For fibromyalgia, a healthy lifestyle is the most important treatment including aerobic and strength exercises. Different types of medicines are used to help treat pain and include: NSAIDs. Medicines for treating depression. Medicines that help control seizures. Medicines that relax the muscles. Treatment for myofascial pain syndrome includes: Pain medicines, such as NSAIDs. Cooling and stretching of muscles. Massage therapy with myofascial release technique. Trigger point injections. Treating these conditions often requires a team of health care providers. These may include: Your primary care provider. A physical therapist. Complementary health care providers, such as massage therapists or acupuncturists. A psychiatrist for cognitive behavioral therapy. Follow these instructions at home: Medicines Take over-the-counter and prescription medicines only as told by your health care provider. Ask your health care provider if the medicine prescribed to you: Requires you to avoid driving or using machinery. Can cause constipation.  You may need to take these actions to prevent or treat constipation: Drink enough fluid to keep your urine pale   yellow. Take over-the-counter or prescription medicines. Eat foods that are high in fiber, such as beans, whole grains, and fresh fruits and vegetables. Limit foods that are high in fat and processed sugars, such as fried or sweet foods. Lifestyle  Do exercises as told by your health care provider or physical therapist. Practice relaxation techniques to control your stress. You may want to try: Biofeedback. Visual imagery. Hypnosis. Muscle relaxation. Yoga. Meditation. Maintain a healthy lifestyle. This includes eating a healthy diet and getting enough sleep. Do not use any products that contain nicotine or tobacco. These products include cigarettes, chewing tobacco, and vaping devices, such as e-cigarettes. If you need help quitting, ask your health care provider. General instructions Talk to your health care provider about complementary treatments, such as acupuncture or massage. Do not do activities that stress or strain your muscles. This includes repetitive motions and heavy lifting. Keep all follow-up visits. This is important. Where to find support Consider joining a support group with others who are diagnosed with this condition. National Fibromyalgia Association: fmaware.org Where to find more information [place] Foundation: uspainfoundation.org Contact a health care provider if: You have new symptoms. Your symptoms get worse or your pain is severe. You have side effects from your medicines. You have trouble sleeping. Your condition is causing depression or anxiety. Get help right away if: You have thoughts of hurting yourself or others. Get help right away if you feel like you may hurt yourself or others, or have thoughts about taking your own life. Go to your nearest emergency room or: Call 911. Call the National Suicide Prevention Lifeline at 1-800-273-8255  or 988. This is open 24 hours a day. Text the Crisis Text Line at 741741. This information is not intended to replace advice given to you by your health care provider. Make sure you discuss any questions you have with your health care provider. Document Revised: 02/12/2022 Document Reviewed: 04/07/2021 Elsevier Patient Education  2024 [employer]  

## 2024-03-13 NOTE — Progress Notes (Signed)
 Subjective:    Patient ID: Kristin Porter Coins, female    DOB: [birth year], 63 y.o.   MRN: [id number]  HPI  Discussed the use of AI scribe software for clinical note transcription with the patient, who gave verbal consent to proceed.  Kristin Porter is a 63 year old female with fibromyalgia who presents with a fibromyalgia flare.  She has been experiencing a severe fibromyalgia flare for the past three weeks, primarily affecting her lower back, hips, and pelvic area. These flares typically occur about three times a year. She received injections in her back and hips three weeks ago, but these did not alleviate her symptoms.  She experiences numbness, tingling, and burning sensations in her back, legs, feet, and hands. Her hands have been cramping severely, requiring assistance from her children to separate her fingers. She also reports stiffness in her jaw.  She had x-rays of her hips in May 2024 and imaging of her lumbar spine in January 2023. She reports significant pain in her back.  She is currently taking baclofen  10 mg three times a day, which she started three weeks ago, meloxicam  15 mg, Percocet 7.5/325 mg twice a day, and Lyrica  100 mg twice a day. Despite these medications, she continues to experience significant pain and fatigue.  She reports major weakness and fatigue, stating she lacks the energy to perform basic tasks such as preparing meals. She is concerned about the chronic nature of her symptoms and the impact on her daily life.       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 Swallow whole. 30 tablet 12   baclofen  (LIORESAL ) 20 MG tablet Take 1 tablet (20 mg total) by mouth 3 (three) times daily. 90 tablet 1   Blood Pressure Monitoring (BLOOD PRESSURE KIT) DEVI 1 Units by Does not apply route in the morning and at bedtime. 1 each 0   butalbital -acetaminophen -caffeine  (FIORICET) 50-325-40 MG tablet Take 1 tablet by mouth every 6 (six) hours as needed for headache. 14 tablet 0   fluticasone  (FLONASE ) 50 MCG/ACT nasal spray Place 1 spray into both nostrils 2 (two) times daily. (Patient taking differently: Place 1 spray into both nostrils as needed.) 9.9 mL 1   meloxicam  (MOBIC ) 15 MG tablet Take 1 tablet (15 mg total) by mouth daily as needed for pain. 90 tablet 1   oxyCODONE -acetaminophen  (PERCOCET) 7.5-325 MG tablet Take 1 tablet by mouth every 12 (twelve) hours as needed for severe pain (pain score 7-10). 60 tablet 0   [START ON 03/25/2024] oxyCODONE -acetaminophen  (PERCOCET) 7.5-325 MG tablet Take 1 tablet by mouth every 12 (twelve) hours as needed for severe pain (pain score 7-10). 60 tablet 0   pantoprazole  (PROTONIX ) 40 MG tablet Take 1 tablet (40 mg total) by mouth daily. 90 tablet 1   PARoxetine  (PAXIL ) 10 MG tablet Take 1 tablet (10 mg total) by mouth daily. 90 tablet 1   pregabalin  (LYRICA ) 100 MG capsule Take  1-2 capsules (100-200 mg total) by mouth 2 (two) times daily. 60 capsule 5   Suvorexant  (BELSOMRA ) 10 MG TABS Take 1 tablet (10 mg total) by mouth at bedtime as needed. 30 tablet 0   triamcinolone  cream (KENALOG ) 0.1 % Apply 1 application topically 2 (two) times daily. (Patient taking differently: Apply 1 application  topically as needed.) 30 g 0   No current facility-administered medications for this visit.    Allergies  Allergen Reactions   Tizanidine Other (See Comments)    Throat closing and trouble breathing    Lisinopril Cough   Tramadol  Itching    Can take with Benadryl    Family History  Family  history unknown: Yes    Social History   Socioeconomic History   Marital status: Divorced    Spouse name: Not on file   Number of children: Not on file   Years of education: Not on file   Highest education level: 11th grade  Occupational History   Occupation: disability   Tobacco Use   Smoking status: Former    Current packs/day: 0.00    Types: Cigarettes    Quit date: 1970    Years since quitting: 55.8    Passive exposure: Past   Smokeless tobacco: Never  Vaping Use   Vaping status: Never Used  Substance and Sexual Activity   Alcohol use: Not Currently   Drug use: Never   Sexual activity: Not Currently  Other Topics Concern   Not on file  Social History Narrative   Lives alone   Social Drivers of Health   Financial Resource Strain: Low Risk  (04/08/2023)   Overall Financial Resource Strain (CARDIA)    Difficulty of Paying Living Expenses: Not hard at all  Food Insecurity: No Food Insecurity (04/08/2023)   Hunger Vital Sign    Worried About Running Out of Food in the Last Year: Never true    Ran Out of Food in the Last Year: Never true  Transportation Needs: No Transportation Needs (04/08/2023)   PRAPARE - Administrator, Civil Service (Medical): No    Lack of Transportation (Non-Medical): No  Physical Activity: Unknown (04/08/2023)   Exercise Vital Sign    Days of Exercise per Week: 0 days    Minutes of Exercise per Session: Not on file  Recent Concern: Physical Activity - Inactive (04/08/2023)   Exercise Vital Sign    Days of Exercise per Week: 0 days    Minutes of Exercise per Session: 20 min  Stress: No Stress Concern Present (04/08/2023)   [employer] of Occupational Health - Occupational Stress Questionnaire    Feeling of Stress : Only a little  Social Connections: Socially Isolated (04/08/2023)   Social Connection and Isolation Panel    Frequency of Communication with Friends and Family: More than three times a week    Frequency of  Social Gatherings with Friends and Family: Twice a week    Attends Religious Services: Never    Database administrator or Organizations: No    Attends Engineer, structural: Not on file    Marital Status: Divorced  Intimate Partner Violence: Not At Risk (10/26/2022)   Humiliation, Afraid, Rape, and Kick questionnaire    Fear of Current or Ex-Partner: No    Emotionally Abused: No    Physically Abused: No    Sexually Abused: No     Constitutional: Patient reports intermittent headaches, fatigue.  Denies fever, malaise, fatigue, or abrupt weight changes.  ry: Denies difficulty breathing, shortness of breath, cough or sputum production.   Cardiovascular: Denies chest pain, chest tightness, palpitations or swelling in the hands or feet.  Gastrointestinal: Pt reports intermittent constipation. Denies abdominal pain, bloating, diarrhea or blood in the stool.  GU: Denies urgency, frequency, pain with urination, burning sensation, blood in urine, odor or discharge. Musculoskeletal: Patient reports chronic joint, muscle pain difficulty with gait.  Denies decrease in range of motion, or joint swelling.  Skin: Denies redness, rashes, lesions or ulcercations.  Neurological: Patient reports insomnia, paresthesia of lower extremities.  Denies dizziness, difficulty with memory, difficulty with speech or problems with balance and coordination.  Psych: Patient has a history of depression.  Denies anxiety, SI/HI.  No other specific complaints in a complete review of systems (except as listed in HPI above).  Objective:   Physical Exam  BP (!) 140/80   Ht 4' 11 (1.499 m)   Wt 200 lb (90.7 kg)   BMI 40.40 kg/m     Wt Readings from Last 3 Encounters:  02/24/24 193 lb (87.5 kg)  02/19/24 193 lb (87.5 kg)  02/03/24 198 lb (89.8 kg)    General: Appears her stated age, obese in NAD. Cardiovascular: Normal rate and rhythm. S1,S2 noted.  No murmur, rubs or gallops noted.  Pulmonary/Chest:  Normal effort and positive vesicular breath sounds. No respiratory distress. No wheezes, rales or ronchi noted.  Musculoskeletal: Decreased flexion and extension of the lumbar spine.  Normal rotation and lateral bending of the spine.  Bony tenderness noted over the lumbar spine and right SI joint.  Strength 5/5 BLE.  Joint enlargement noted in knees. Gait slow and steady with use of cane. Neurological: Alert and oriented. Coordination normal.    BMET    Component Value Date/Time   NA 137 10/10/2023 1353   K 4.5 10/10/2023 1353   CL 103 10/10/2023 1353   CO2 25 10/10/2023 1353   GLUCOSE 95 10/10/2023 1353   BUN 17 10/10/2023 1353   CREATININE 0.66 10/10/2023 1353   CALCIUM  9.7 10/10/2023 1353   GFRNONAA >60 08/06/2023 2157   GFRNONAA 92 03/22/2020 0911   GFRAA 107 03/22/2020 0911    Lipid Panel     Component Value Date/Time   CHOL 236 (H) 10/10/2023 1353   TRIG 120 10/10/2023 1353   HDL 51 10/10/2023 1353   CHOLHDL 4.6 10/10/2023 1353   VLDL 17 09/08/2020 0914   LDLCALC 161 (H) 10/10/2023 1353    CBC    Component Value Date/Time   WBC 6.4 10/10/2023 1353   RBC 4.81 10/10/2023 1353   HGB 14.1 10/10/2023 1353   HCT 42.6 10/10/2023 1353   PLT 300 10/10/2023 1353   MCV 88.6 10/10/2023 1353   MCH 29.3 10/10/2023 1353   MCHC 33.1 10/10/2023 1353   RDW 13.1 10/10/2023 1353   LYMPHSABS 2.2 08/06/2023 2157   MONOABS 0.6 08/06/2023 2157   EOSABS 0.1 08/06/2023 2157   BASOSABS 0.0 08/06/2023 2157    Hgb A1C Lab Results  Component Value Date   HGBA1C 6.0 (H) 10/10/2023           Assessment & Plan:     RTC in 1 months for your annual exam Angeline Laura, NP

## 2024-03-16 ENCOUNTER — Ambulatory Visit: Payer: Self-pay | Admitting: Internal Medicine

## 2024-03-16 ENCOUNTER — Ambulatory Visit
Admission: RE | Admit: 2024-03-16 | Discharge: 2024-03-16 | Disposition: A | Discharge status: Home / Self Care | Source: Ambulatory Visit | Attending: Internal Medicine | Admitting: Internal Medicine

## 2024-03-16 DIAGNOSIS — M797 Fibromyalgia: Secondary | ICD-10-CM | POA: Diagnosis present

## 2024-03-16 DIAGNOSIS — M25551 Pain in right hip: Secondary | ICD-10-CM | POA: Diagnosis present

## 2024-03-16 DIAGNOSIS — M2550 Pain in unspecified joint: Secondary | ICD-10-CM

## 2024-03-16 DIAGNOSIS — M47817 Spondylosis without myelopathy or radiculopathy, lumbosacral region: Secondary | ICD-10-CM | POA: Diagnosis present

## 2024-03-16 DIAGNOSIS — G8929 Other chronic pain: Secondary | ICD-10-CM | POA: Insufficient documentation

## 2024-03-16 DIAGNOSIS — R7982 Elevated C-reactive protein (CRP): Secondary | ICD-10-CM

## 2024-03-16 DIAGNOSIS — G894 Chronic pain syndrome: Secondary | ICD-10-CM | POA: Insufficient documentation

## 2024-03-16 DIAGNOSIS — M0580 Other rheumatoid arthritis with rheumatoid factor of unspecified site: Secondary | ICD-10-CM

## 2024-03-16 DIAGNOSIS — M25552 Pain in left hip: Secondary | ICD-10-CM | POA: Insufficient documentation

## 2024-03-16 DIAGNOSIS — R700 Elevated erythrocyte sedimentation rate: Secondary | ICD-10-CM

## 2024-03-16 DIAGNOSIS — R7689 Other specified abnormal immunological findings in serum: Secondary | ICD-10-CM

## 2024-03-16 LAB — VITAMIN D 25 HYDROXY (VIT D DEFICIENCY, FRACTURES): Vit D, 25-Hydroxy: 18 ng/mL — ABNORMAL LOW (ref 30–100)

## 2024-03-16 LAB — TSH: TSH: 1.07 m[IU]/L (ref 0.40–4.50)

## 2024-03-16 LAB — SEDIMENTATION RATE: Sed Rate: 56 mm/h — ABNORMAL HIGH (ref 0–30)

## 2024-03-16 LAB — ANTI-NUCLEAR AB-TITER (ANA TITER)
ANA TITER: 1:40 {titer} — ABNORMAL HIGH
ANA Titer 1: 1:640 {titer} — ABNORMAL HIGH

## 2024-03-16 LAB — ANA: Anti Nuclear Antibody (ANA): POSITIVE — AB

## 2024-03-16 LAB — RHEUMATOID FACTOR: Rheumatoid fact SerPl-aCnc: 19 [IU]/mL — ABNORMAL HIGH (ref <14)

## 2024-03-16 LAB — VITAMIN B12: Vitamin B-12: 495 pg/mL (ref 200–1100)

## 2024-03-16 LAB — C-REACTIVE PROTEIN: CRP: 10.6 mg/L — ABNORMAL HIGH (ref <8.0)

## 2024-03-16 MED ORDER — VITAMIN D (ERGOCALCIFEROL) 1.25 MG (50000 UNIT) PO CAPS: 50000.0000 [IU] | ORAL_CAPSULE | ORAL | 0 refills | Status: AC

## 2024-03-23 NOTE — Progress Notes (Unsigned)
 PROVIDER NOTE: Interpretation of information contained herein should be left to medically-trained personnel. Specific patient instructions are provided elsewhere under Patient Instructions section of medical record. This document was created in part using AI and STT-dictation technology, any transcriptional errors that may result from this process are unintentional.  Patient: Kristin Porter  Service: E/M   PCP: Antonette Angeline ORN, NP  DOB: 23-Jun-1960  DOS: 03/24/2024  Provider: Emmy MARLA Blanch, NP  MRN: [id number]  Delivery: Face-to-face  Specialty: Interventional Pain Management  Type: Established Patient  Setting: Ambulatory outpatient facility  Specialty designation: 09  Referring Prov.: Antonette Angeline ORN, NP  Location: Outpatient office facility       History of present illness (HPI) Kristin Porter, a 63 y.o. year old female, is here today because of her Primary osteoarthritis of both hips [M16.0]. Kristin Porter primary complain today is Hip Pain (bilateral)  Pain Assessment: Severity of Chronic pain is reported as a 8 /10. Location: Hip Right, Left/radiates down both thigh on and off. Onset: More than a month ago. Quality: Aching, Stabbing, Throbbing. Timing: Constant. Modifying factor(s): sitting down, meds. Vitals:  height is 4' 11 (1.499 m) and weight is 198 lb (89.8 kg). Her temperature is 98.1 F (36.7 C). Her blood pressure is 142/95 (abnormal) and her pulse is 85. Her oxygen  saturation is 98%.  BMI: Estimated body mass index is 39.99 kg/m as calculated from the following:   Height as of this encounter: 4' 11 (1.499 m).   Weight as of this encounter: 198 lb (89.8 kg).  Last encounter: 02/24/2024. Last procedure: Visit date not found.  Reason for encounter: post-procedure evaluation and assessment.   Discussed the use of AI scribe software for clinical note transcription with the patient, who gave verbal consent to proceed.  History of Present Illness   Kristin Porter  is a 63 year old female presents for post procedure evaluation.  She experiences persistent pain that returned four days after a recent procedure, significantly affecting her daily life. Despite the procedure, the pain remains widespread and challenging to manage.   Kristin Porter received a diagnostic/therapeutic intra-articular hip injection on February 19, 2024.  She reports initially 75% pain relief and functional improvement for approximately 4 days, however, she reports that her hip pain back to baseline.   Her primary care provider order extensive blood work revealed abnormalities such as a positive rheumatoid factor, elevated C-reactive protein, and high sedimentation rate. Additionally, her ANA was positive.  The patient was referred to a rheumatologist by her primary care provider for further evaluation of abnormal laboratory findings.   Her vitamin D levels are very low, and she has been prescribed a vitamin D supplement to take once a week for twenty weeks from her PCP.   She is currently taking oxycodone  -acetaminophen  7.5-325 mg BID, baclofen , and Lyrica , which she finds helpful for sleep. She has a prescription for Lyrica  at her pharmacy and reports no side effects from it.     Procedure Procedure: Intra-articular hip injection  #1  Laterality: Bilateral (-50)  Approach: Percutaneous anterolateral approach. Level: Lower pelvic and hip joint level.  Imaging: Fluoroscopy-guided         Anesthesia: Local anesthesia (1-2% Lidocaine ) Sedation: Minimal Sedation                       DOS: 02/19/2024  Performed by: Wallie Sherry, MD   Purpose: Diagnostic/Therapeutic Indications: Hip pain severe enough to impact quality of  life or function. Rationale (medical necessity): procedure needed and proper for the diagnosis and/or treatment of Kristin Porter's medical symptoms and needs. 1. Chronic pain of both hips   2. Primary osteoarthritis of both hips     NAS-11 Pain score:         Pre-procedure: 7 /10        Post-procedure: 6  (standing and walking)/10   Post-Procedure Evaluation    Effectiveness:  Initial hour after procedure: 75 % . Subsequent 4-6 hours post-procedure: 0 % . Analgesia past initial 6 hours: 0 % . Ongoing improvement:  Analgesic:  Kristin Porter received a diagnostic/therapeutic intra-articular hip injection on February 19, 2024.  She reports initially 75% pain relief and functional improvement for approximately 4 days, however, she reports that her hip pain back to baseline.  Function: Back to baseline ROM: Back to baseline   Pharmacotherapy Assessment   Monitoring: [place] PMP: PDMP reviewed during this encounter.       Pharmacotherapy: No side-effects or adverse reactions reported. Compliance: No problems identified. Effectiveness: Clinically acceptable.  Kristin Nathanel PARAS, RN  03/24/2024 11:08 AM  Sign when Signing Visit Safety precautions to be maintained throughout the outpatient stay will include: orient to surroundings, keep bed in low position, maintain call bell within reach at all times, provide assistance with transfer out of bed and ambulation.     UDS:  Summary  Date Value Ref Range Status  11/14/2023 FINAL  Final    Comment:    ==================================================================== [employer] Select 13 (MW) ==================================================================== Test                             Result       Flag       Units  Drug Absent but Declared for Prescription Verification   Oxycodone                       Not Detected UNEXPECTED ng/mg creat   Butalbital                      Not Detected UNEXPECTED ==================================================================== Test                      Result    Flag   Units      Ref Range   Creatinine              125              mg/dL      >=79 ==================================================================== Declared Medications:  The flagging and  interpretation on this report are based on the  following declared medications.  Unexpected results may arise from  inaccuracies in the declared medications.   **Note: The testing scope of this panel includes these medications:   Butalbital  (Fioricet)  Oxycodone  (Percocet)   **Note: The testing scope of this panel does not include the  following reported medications:   Acetaminophen  (Fioricet)  Acetaminophen  (Percocet)  Aspirin   Baclofen  (Lioresal )  Caffeine  (Fioricet)  Fluticasone  (Flonase )  Meloxicam  (Mobic )  Pantoprazole  (Protonix )  Paroxetine  (Paxil )  Pregabalin  (Lyrica )  Suvorexant  (Belsomra )  Triamcinolone  (Kenalog ) ==================================================================== For clinical consultation, please call [phone]. ====================================================================     No results found for: CBDTHCR No results found for: D8THCCBX No results found for: D9THCCBX  ROS  Constitutional: Denies any fever or chills Gastrointestinal: No reported hemesis, hematochezia, vomiting, or acute GI distress Musculoskeletal: Bilateral hip pain  Neurological: No reported episodes of acute onset apraxia, aphasia, dysarthria, agnosia, amnesia, paralysis, loss of coordination, or loss of consciousness  Medication Review  Blood Pressure Kit, PARoxetine , Suvorexant , Vitamin D (Ergocalciferol), aspirin  EC, baclofen , butalbital -acetaminophen -caffeine , fluticasone , meloxicam , oxyCODONE -acetaminophen , pantoprazole , pregabalin , and triamcinolone  cream  History Review  Allergy: Kristin Porter is allergic to tizanidine, lisinopril, and tramadol . Drug: Kristin Porter  reports no history of drug use. Alcohol:  reports that she does not currently use alcohol. Tobacco:  reports that she quit smoking about 55 years ago. Her smoking use included cigarettes. She has been exposed to tobacco smoke. She has never used smokeless tobacco. Social: Kristin Porter  reports  that she quit smoking about 55 years ago. Her smoking use included cigarettes. She has been exposed to tobacco smoke. She has never used smokeless tobacco. She reports that she does not currently use alcohol. She reports that she does not use drugs. 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 Surgical: Kristin Porter  has a past surgical history that includes Breast biopsy (Right, 2018); Esophagogastroduodenoscopy (egd) with propofol  (N/A, 02/22/2021); Breast biopsy (Left, 07/12/2021); Replacement total knee (Left); Cesarean section; Colonoscopy; Back surgery (2016); Mass excision (Right, 03/05/2023); and Ankle surgery (Right, 08/2023). Family: Family history is unknown by patient.  Laboratory Chemistry Profile   Renal Lab Results  Component Value Date   BUN 17 10/10/2023   CREATININE 0.66 10/10/2023   BCR SEE NOTE: 10/10/2023   GFRAA 107 03/22/2020   GFRNONAA >60 08/06/2023    Hepatic Lab Results  Component Value Date   AST 13 10/10/2023   ALT 13 10/10/2023    Electrolytes Lab Results  Component Value Date   NA 137 10/10/2023   K 4.5 10/10/2023   CL 103 10/10/2023   CALCIUM  9.7 10/10/2023    Bone Lab Results  Component Value Date   VD25OH 18 (L) 03/13/2024    Inflammation (CRP: Acute Phase) (ESR: Chronic Phase) Lab Results  Component Value Date   CRP 10.6 (H) 03/13/2024   ESRSEDRATE 56 (H) 03/13/2024         Note: Above Lab results reviewed.  Recent Imaging Review  MR Lumbar Spine Wo Contrast EXAM: MRI LUMBAR SPINE 03/16/2024 05:32:29 PM  TECHNIQUE: Multiplanar multisequence MRI of the lumbar spine was performed without the administration of intravenous contrast.  COMPARISON: Prior radiograph from 05/30/2021.  CLINICAL  HISTORY: Lumbar radiculopathy, symptoms persist with more than 6 weeks treatment, lower back pain weakness goes into both legs, history of thoracic surgery, trouble in walking.  FINDINGS:  BONES AND ALIGNMENT: Transitional features about the lumbosacral junction. There appear to be rudimentary ribs at T12. There is sacralization of the L5 vertebral body with a rudimentary L5-S1 interspace. Mild exaggeration of the normal lumbar lordosis. No listhesis. Normal vertebral body heights. Bone marrow signal is unremarkable. Few scattered benign bone marrow signal abnormalities are noted. No worrisome osseous lesions. Mild reactive marrow edema is present about the L3-L4 facets bilaterally due to facet arthritis.  SPINAL CORD: The conus medullaris terminates at the level of L1.  SOFT TISSUES: No paraspinal mass.  T12-L1: Unremarkable.  L1-L2: Normal interspace. Mild facet hypertrophy. No spinal canal stenosis or neural foraminal narrowing.  L2-L3: Disc desiccation with mild disc bulge. Mild to moderate facet hypertrophy with associated small joint effusion on the right. No more than mild spinal  stenosis. Mild left with mild to moderate right L2 foraminal narrowing.  L3-L4: Disc desiccation with mild disc bulge. Moderate bilateral facet hypertrophy with associated small joint effusions. Ligamentum flavum redundancy. No more than mild spinal stenosis. Mild bilateral L3 foraminal narrowing.  L4-L5: Degenerative intervertebral disc space narrowing with diffuse disc bulge and disc desiccation. Reactive endplate spurring. Mild to moderate left with mild right facet hypertrophy. No spinal canal stenosis. Foramina remain patent.  L5-S1: Transitional lumbosacral anatomy with a rudimentary L5-S1 disc space. No disc bulge or focal disc herniation. No spinal canal stenosis or neural foraminal narrowing.  IMPRESSION: 1. Multilevel lumbar spondylosis with no more than mild spinal stenosis  at L2-L3 and L3-L4. Mild to moderate bilateral L2 through L4 foraminal narrowing as above. 2. Lower lumbar facet hypertrophy, most pronounced at L3-4 where there is associated mild reactive marrow edema. 3. Transitional lumbosacral anatomy with sacralization of the L5 vertebral body. Careful correlation with numbering system on this exam recommended prior to any potential future intervention.  Electronically signed by: Morene Hoard MD 03/19/2024 05:02 AM EDT RP Workstation: [id number] Note: Reviewed        Physical Exam  Vitals: BP (!) 142/95   Pulse 85   Temp 98.1 F (36.7 C)   Ht 4' 11 (1.499 m)   Wt 198 lb (89.8 kg)   SpO2 98%   BMI 39.99 kg/m  BMI: Estimated body mass index is 39.99 kg/m as calculated from the following:   Height as of this encounter: 4' 11 (1.499 m).   Weight as of this encounter: 198 lb (89.8 kg). Ideal: Patient must be at least 60 in tall to calculate ideal body weight General appearance: Well nourished, well developed, and well hydrated. In no apparent acute distress Mental status: Alert, oriented x 3 (person, place, & time)       Respiratory: No evidence of acute respiratory distress Eyes: PERLA  Musculoskeletal: Bilateral hip pain Assessment   Diagnosis Status  1. Primary osteoarthritis of both hips   2. Fibromyalgia   3. Chronic pain syndrome   4. Myofascial pain syndrome of lumbar spine   5. Chronic bilateral low back pain without sciatica    Persistent Controlled Controlled   Updated Problems: No problems updated.  Plan of Care  Problem-specific:  Assessment and Plan    Primary osteoarthritis of both hips: She experiences persistent pain that returned four days after a recent procedure, significantly affecting her daily life. Despite the procedure, the pain remains widespread and challenging to manage. Short-lived relief from previous procedure. Current regimen includes oxycodone , Tylenol , Baclofen , and Lyrica .  Chronic  persistent pain exacerbated by suspected rheumatoid arthritis and systemic lupus erythematosus after abnormal blood work that ordered by her primary care provider (PCP).   Fibromyalgia Contributes to chronic pain and myalgia. Lyrica  effective for symptom management and sleep aid. - Continue Lyrica  for fibromyalgia management.  Suspected rheumatoid arthritis and suspected systemic lupus erythematosus Abnormal blood work with elevated rheumatoid factor, C-reactive protein, sed rate, and positive ANA. Low vitamin D may contribute to joint pain. Awaiting rheumatology referral. - Continue vitamin D supplementation as prescribed. - Await rheumatology referral for further evaluation and management.       Kristin Porter has a current medication list which includes the following long-term medication(s): fluticasone , pantoprazole , paroxetine , and pregabalin .  Pharmacotherapy (Medications Ordered): Meds ordered this encounter  Medications   pregabalin  (LYRICA ) 100 MG capsule    Sig: Take 1-2 capsules (100-200 mg total) by mouth 2 (two)  times daily.    Dispense:  60 capsule    Refill:  5    Fill one day early if pharmacy is closed on scheduled refill date. May substitute for generic if available.   Orders:  No orders of the defined types were placed in this encounter.       Return in about 1 month (around 04/23/2024) for (F2F), (MM), Emmy Blanch NP.    Recent Visits Date Type Provider Dept  02/24/24 Office Visit Tanashia Ciesla K, NP [facility]  02/19/24 Procedure visit Marcelino Nurse, MD [facility]  02/03/24 Office Visit Tyeisha Dinan K, NP [facility]  01/08/24 Office Visit Jenesa Foresta K, NP [facility]  Showing recent visits within past 90 days and meeting all other requirements Today's Visits Date Type Provider Dept  03/24/24 Office Visit Catalyna Reilly K, NP [facility]  Showing today's visits and meeting all other  requirements Future Appointments Date Type Provider Dept  04/20/24 Appointment Moira Umholtz K, NP [facility]  Showing future appointments within next 90 days and meeting all other requirements  I discussed the assessment and treatment plan with the patient. The patient was provided an opportunity to ask questions and all were answered. The patient agreed with the plan and demonstrated an understanding of the instructions.  Patient advised to call back or seek an in-person evaluation if the symptoms or condition worsens.  I personally spent a total of 30 minutes in the care of the patient today including preparing to see the patient, getting/reviewing separately obtained history, performing a medically appropriate exam/evaluation, counseling and educating, placing orders, referring and communicating with other health care professionals, documenting clinical information in the EHR, independently interpreting results, communicating results, and coordinating care.   Note by: Preciliano Castell K Miah Boye, NP (TTS and AI technology used. I apologize for any typographical errors that were not detected and corrected.) Date: 03/24/2024; Time: 11:45 AM

## 2024-03-24 ENCOUNTER — Encounter: Payer: Self-pay | Admitting: Nurse Practitioner

## 2024-03-24 ENCOUNTER — Ambulatory Visit: Attending: Nurse Practitioner | Admitting: Nurse Practitioner

## 2024-03-24 ENCOUNTER — Telehealth: Payer: Self-pay | Admitting: Internal Medicine

## 2024-03-24 VITALS — BP 142/95 | HR 85 | Temp 98.1°F | Ht 59.0 in | Wt 198.0 lb

## 2024-03-24 DIAGNOSIS — M160 Bilateral primary osteoarthritis of hip: Secondary | ICD-10-CM | POA: Insufficient documentation

## 2024-03-24 DIAGNOSIS — M545 Low back pain, unspecified: Secondary | ICD-10-CM | POA: Diagnosis present

## 2024-03-24 DIAGNOSIS — M797 Fibromyalgia: Secondary | ICD-10-CM | POA: Diagnosis not present

## 2024-03-24 DIAGNOSIS — M7918 Myalgia, other site: Secondary | ICD-10-CM | POA: Diagnosis present

## 2024-03-24 DIAGNOSIS — G894 Chronic pain syndrome: Secondary | ICD-10-CM | POA: Insufficient documentation

## 2024-03-24 DIAGNOSIS — G8929 Other chronic pain: Secondary | ICD-10-CM | POA: Diagnosis present

## 2024-03-24 MED ORDER — PREGABALIN 100 MG PO CAPS: 100.0000 mg | ORAL_CAPSULE | Freq: Two times a day (BID) | ORAL | 5 refills | Status: AC

## 2024-03-24 NOTE — Telephone Encounter (Signed)
 Copied from CRM [id number]. Topic: Referral - Question >> Mar 24, 2024 12:52 PM Cleave MATSU wrote: Reason for CRM: pt said Dr.Baity was supposed to send her a referral over to a rheumatology and she hasn't heard anything yet and its been two weeks  can you call pt back to assist with this.

## 2024-03-24 NOTE — Progress Notes (Signed)
 Safety precautions to be maintained throughout the outpatient stay will include: orient to surroundings, keep bed in low position, maintain call bell within reach at all times, provide assistance with transfer out of bed and ambulation.

## 2024-03-25 NOTE — Telephone Encounter (Signed)
 Left message for patient to return call OK  to advise referral is still in process

## 2024-04-18 NOTE — Progress Notes (Unsigned)
 04/20/2024 - No show

## 2024-04-20 ENCOUNTER — Encounter: Admitting: Internal Medicine

## 2024-04-20 ENCOUNTER — Ambulatory Visit (HOSPITAL_BASED_OUTPATIENT_CLINIC_OR_DEPARTMENT_OTHER): Admitting: Nurse Practitioner

## 2024-04-20 DIAGNOSIS — M47817 Spondylosis without myelopathy or radiculopathy, lumbosacral region: Secondary | ICD-10-CM

## 2024-04-20 DIAGNOSIS — M7918 Myalgia, other site: Secondary | ICD-10-CM

## 2024-04-20 DIAGNOSIS — G8929 Other chronic pain: Secondary | ICD-10-CM

## 2024-04-20 DIAGNOSIS — G894 Chronic pain syndrome: Secondary | ICD-10-CM

## 2024-04-20 DIAGNOSIS — Z91199 Patient's noncompliance with other medical treatment and regimen due to unspecified reason: Secondary | ICD-10-CM

## 2024-04-20 DIAGNOSIS — E66813 Obesity, class 3: Secondary | ICD-10-CM

## 2024-04-20 DIAGNOSIS — M25512 Pain in left shoulder: Secondary | ICD-10-CM

## 2024-04-20 DIAGNOSIS — Z0289 Encounter for other administrative examinations: Secondary | ICD-10-CM

## 2024-04-20 DIAGNOSIS — M797 Fibromyalgia: Secondary | ICD-10-CM

## 2024-04-20 DIAGNOSIS — Z79899 Other long term (current) drug therapy: Secondary | ICD-10-CM

## 2024-04-20 DIAGNOSIS — M47816 Spondylosis without myelopathy or radiculopathy, lumbar region: Secondary | ICD-10-CM

## 2024-04-20 NOTE — Progress Notes (Deleted)
 Subjective:    Patient ID: Kristin Porter, female    DOB: 11/23/1960, 63 y.o.   MRN: [id number]  HPI  Patient presents the clinic today for her annual exam.  Flu: 02/2012 Tetanus: 04/2018 COVID: Never Prevnar 20: Never Shingrix: Never Pap smear: 08/2019 Mammogram: 01/2024 Bone density: 09/2019 Colon screening: 02/2019 Vision screening: annually Dentist: biannually  Diet: She does eat meat. She consumes fruits and veggies. She does eat some fried foods. She drinks mostly water, tea, juice. 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 Swallow whole. 30 tablet 12   baclofen  (LIORESAL ) 20 MG tablet Take 1 tablet (20 mg total) by mouth 3 (three) times daily. 90 tablet 1   Blood Pressure Monitoring (BLOOD PRESSURE KIT) DEVI 1 Units by Does not apply route in the morning and at bedtime. 1 each 0   butalbital -acetaminophen -caffeine  (FIORICET) 50-325-40 MG tablet Take 1 tablet by mouth every 6 (six) hours as needed for headache. 14 tablet 0   fluticasone  (FLONASE ) 50 MCG/ACT nasal spray Place 1 spray into both nostrils 2 (two) times daily. (Patient taking differently: Place 1 spray into both nostrils as needed.) 9.9 mL 1   meloxicam  (MOBIC ) 15 MG tablet Take 1 tablet (15 mg total) by mouth daily as needed for pain. 90 tablet 1   oxyCODONE -acetaminophen  (PERCOCET) 7.5-325 MG tablet Take 1 tablet by mouth every 12 (twelve) hours as needed for severe pain (pain score 7-10). 60 tablet 0   pantoprazole  (PROTONIX ) 40 MG tablet Take 1 tablet (40 mg  total) by mouth daily. 90 tablet 1   PARoxetine  (PAXIL ) 10 MG tablet Take 1 tablet (10 mg total) by mouth daily. 90 tablet 1   pregabalin  (LYRICA ) 100 MG capsule Take 1-2 capsules (100-200 mg total) by mouth 2 (two) times daily. 60 capsule 5   Suvorexant  (BELSOMRA ) 10 MG TABS Take 1 tablet (10 mg total) by mouth at bedtime as needed. 30 tablet 0   triamcinolone  cream (KENALOG ) 0.1 % Apply 1 application topically 2 (two) times daily. (Patient taking differently: Apply 1 application  topically as needed.) 30 g 0   Vitamin D , Ergocalciferol , (DRISDOL ) 1.25 MG (50000 UNIT) CAPS capsule Take 1 capsule (50,000 Units total) by mouth once a week. For 12 weeks. Then start OTC Vitamin D3 2,000 unit daily. 12 capsule 0   No current facility-administered medications for this visit.    Allergies  Allergen Reactions   Tizanidine Other (See Comments)    Throat closing and trouble breathing    Lisinopril Cough   Tramadol  Itching    Can take with Benadryl    Family History  Family history unknown: Yes    Social History   Socioeconomic History   Marital status: Divorced    Spouse name: Not on file   Number of children: Not on file   Years of education: Not on file   Highest education level: 11th grade  Occupational History   Occupation:  disability   Tobacco Use   Smoking status: Former    Current packs/day: 0.00    Types: Cigarettes    Quit date: 1970    Years since quitting: 55.9    Passive exposure: Past   Smokeless tobacco: Never  Vaping Use   Vaping status: Never Used  Substance and Sexual Activity   Alcohol use: Not Currently   Drug use: Never   Sexual activity: Not Currently  Other Topics Concern   Not on file  Social History Narrative   Lives alone   Social Drivers of Health   Financial Resource Strain: Low Risk  (04/08/2023)   Overall Financial Resource Strain (CARDIA)    Difficulty of Paying Living Expenses: Not hard at all  Food Insecurity: No Food Insecurity  (04/08/2023)   Hunger Vital Sign    Worried About Running Out of Food in the Last Year: Never true    Ran Out of Food in the Last Year: Never true  Transportation Needs: No Transportation Needs (04/08/2023)   PRAPARE - Administrator, Civil Service (Medical): No    Lack of Transportation (Non-Medical): No  Physical Activity: Unknown (04/08/2023)   Exercise Vital Sign    Days of Exercise per Week: 0 days    Minutes of Exercise per Session: Not on file  Recent Concern: Physical Activity - Inactive (04/08/2023)   Exercise Vital Sign    Days of Exercise per Week: 0 days    Minutes of Exercise per Session: 20 min  Stress: No Stress Concern Present (04/08/2023)   [employer] of Occupational Health - Occupational Stress Questionnaire    Feeling of Stress : Only a little  Social Connections: Socially Isolated (04/08/2023)   Social Connection and Isolation Panel    Frequency of Communication with Friends and Family: More than three times a week    Frequency of Social Gatherings with Friends and Family: Twice a week    Attends Religious Services: Never    Database Administrator or Organizations: No    Attends Engineer, Structural: Not on file    Marital Status: Divorced  Intimate Partner Violence: Not At Risk (10/26/2022)   Humiliation, Afraid, Rape, and Kick questionnaire    Fear of Current or Ex-Partner: No    Emotionally Abused: No    Physically Abused: No    Sexually Abused: No     Constitutional: Pt reports intermittent headache. Denies fever, malaise, fatigue, headache or abrupt weight changes.  HEENT: Denies eye pain, eye redness, ear pain, ringing in the ears, wax buildup, runny nose, nasal congestion, bloody nose, or sore throat. Respiratory: Denies difficulty breathing, shortness of breath, cough or sputum production.   Cardiovascular: Denies chest pain, chest tightness, palpitations or swelling in the hands or feet.  Gastrointestinal: Pt reports  intermittent reflux, constipation. Denies abdominal pain, bloating, diarrhea or blood in the stool.  GU: Denies urgency, frequency, pain with urination, burning sensation, blood in urine, odor or discharge. Musculoskeletal: Patient reports chronic joint and muscle pain.  Denies decrease in range of motion, difficulty with gait, or joint swelling.  Skin: Denies redness, rashes, lesions or ulcercations.  Neurological: Pt reports insomnia. Denies dizziness, difficulty with memory, difficulty with speech or problems with balance and coordination.  Psych: Denies anxiety, depression, SI/HI.  No other specific complaints in a complete review of systems (except as listed in HPI above).     Objective:   Physical Exam There were no vitals taken for this visit.  Wt Readings from Last 3 Encounters:  03/24/24 198 lb (89.8 kg)  03/13/24 200 lb (90.7 kg)  02/24/24 193 lb (87.5 kg)    General: Appears her stated age, obese, in NAD. Skin: Warm, dry and intact. No rashes noted. HEENT: Head: normal shape and size; Eyes: sclera white, no icterus, conjunctiva pink, PERRLA and EOMs intact;  Neck:  Neck supple, trachea midline. No masses, lumps or thyromegaly present.  Cardiovascular: Normal rate and rhythm. S1,S2 noted.  No murmur, rubs or gallops noted. No JVD or BLE edema. No carotid bruits noted. Pulmonary/Chest: Normal effort and positive vesicular breath sounds. No respiratory distress. No wheezes, rales or ronchi noted.  Abdomen: Normal bowel sounds.  Musculoskeletal: Strength 5/5 BUE/BLE. She has difficulty getting from a sitting to a standing position.  Gait slow but steady with use of cane. Neurological: Alert and oriented. Cranial nerves II-XII grossly intact. Coordination normal.  Psychiatric: Mood and affect normal. Behavior is normal. Judgment and thought content normal.     BMET    Component Value Date/Time   NA 137 10/10/2023 1353   K 4.5 10/10/2023 1353   CL 103 10/10/2023 1353    CO2 25 10/10/2023 1353   GLUCOSE 95 10/10/2023 1353   BUN 17 10/10/2023 1353   CREATININE 0.66 10/10/2023 1353   CALCIUM  9.7 10/10/2023 1353   GFRNONAA >60 08/06/2023 2157   GFRNONAA 92 03/22/2020 0911   GFRAA 107 03/22/2020 0911    Lipid Panel     Component Value Date/Time   CHOL 236 (H) 10/10/2023 1353   TRIG 120 10/10/2023 1353   HDL 51 10/10/2023 1353   CHOLHDL 4.6 10/10/2023 1353   VLDL 17 09/08/2020 0914   LDLCALC 161 (H) 10/10/2023 1353    CBC    Component Value Date/Time   WBC 6.4 10/10/2023 1353   RBC 4.81 10/10/2023 1353   HGB 14.1 10/10/2023 1353   HCT 42.6 10/10/2023 1353   PLT 300 10/10/2023 1353   MCV 88.6 10/10/2023 1353   MCH 29.3 10/10/2023 1353   MCHC 33.1 10/10/2023 1353   RDW 13.1 10/10/2023 1353   LYMPHSABS 2.2 08/06/2023 2157   MONOABS 0.6 08/06/2023 2157   EOSABS 0.1 08/06/2023 2157   BASOSABS 0.0 08/06/2023 2157    Hgb A1C Lab Results  Component Value Date   HGBA1C 6.0 (H) 10/10/2023           Assessment & Plan:   Preventative Health Maintenance:  She declines flu shot today Tetanus UTD Encouraged her to get her COVID-vaccine Prevnar 20 Discussed Shingrix vaccine, she will check coverage with her insurance company and get this done at the pharmacy Pap smear Mammogram UTD Bone density UTD Colon screening UTD Encouraged her to consume a balanced diet and exercise regimen Advised her to see an eye doctor and dentist annually We will check CBC, c-Met, lipid and A1c today  RTC in 6 months, follow-up chronic conditions Angeline Laura, NP

## 2024-04-23 ENCOUNTER — Encounter: Admitting: Nurse Practitioner
